# Patient Record
Sex: Female | Born: 1935 | Race: White | Hispanic: No | Marital: Married | Smoking: Never smoker
Health system: Southern US, Community
[De-identification: ages and names within clinical notes are randomized; demographics above are authoritative.]

## PROBLEM LIST (undated history)

## (undated) DIAGNOSIS — F32A Depression, unspecified: Secondary | ICD-10-CM

## (undated) DIAGNOSIS — G4730 Sleep apnea, unspecified: Secondary | ICD-10-CM

## (undated) DIAGNOSIS — R630 Anorexia: Secondary | ICD-10-CM

## (undated) DIAGNOSIS — R51 Headache: Secondary | ICD-10-CM

## (undated) DIAGNOSIS — R011 Cardiac murmur, unspecified: Secondary | ICD-10-CM

## (undated) DIAGNOSIS — D649 Anemia, unspecified: Secondary | ICD-10-CM

## (undated) DIAGNOSIS — T7840XA Allergy, unspecified, initial encounter: Secondary | ICD-10-CM

## (undated) DIAGNOSIS — T8859XA Other complications of anesthesia, initial encounter: Secondary | ICD-10-CM

## (undated) DIAGNOSIS — Z923 Personal history of irradiation: Secondary | ICD-10-CM

## (undated) DIAGNOSIS — T4145XA Adverse effect of unspecified anesthetic, initial encounter: Secondary | ICD-10-CM

## (undated) DIAGNOSIS — M1990 Unspecified osteoarthritis, unspecified site: Secondary | ICD-10-CM

## (undated) DIAGNOSIS — R110 Nausea: Secondary | ICD-10-CM

## (undated) DIAGNOSIS — I209 Angina pectoris, unspecified: Secondary | ICD-10-CM

## (undated) DIAGNOSIS — C801 Malignant (primary) neoplasm, unspecified: Secondary | ICD-10-CM

## (undated) DIAGNOSIS — C189 Malignant neoplasm of colon, unspecified: Secondary | ICD-10-CM

## (undated) DIAGNOSIS — I10 Essential (primary) hypertension: Secondary | ICD-10-CM

## (undated) DIAGNOSIS — R531 Weakness: Secondary | ICD-10-CM

## (undated) DIAGNOSIS — M7989 Other specified soft tissue disorders: Secondary | ICD-10-CM

## (undated) DIAGNOSIS — J3489 Other specified disorders of nose and nasal sinuses: Secondary | ICD-10-CM

## (undated) DIAGNOSIS — F329 Major depressive disorder, single episode, unspecified: Secondary | ICD-10-CM

## (undated) DIAGNOSIS — C787 Secondary malignant neoplasm of liver and intrahepatic bile duct: Secondary | ICD-10-CM

## (undated) DIAGNOSIS — E7800 Pure hypercholesterolemia, unspecified: Secondary | ICD-10-CM

## (undated) DIAGNOSIS — F419 Anxiety disorder, unspecified: Secondary | ICD-10-CM

## (undated) DIAGNOSIS — L719 Rosacea, unspecified: Secondary | ICD-10-CM

## (undated) DIAGNOSIS — R6883 Chills (without fever): Secondary | ICD-10-CM

## (undated) DIAGNOSIS — K219 Gastro-esophageal reflux disease without esophagitis: Secondary | ICD-10-CM

## (undated) HISTORY — DX: Anorexia: R63.0

## (undated) HISTORY — DX: Chills (without fever): R68.83

## (undated) HISTORY — DX: Malignant (primary) neoplasm, unspecified: C80.1

## (undated) HISTORY — DX: Rosacea, unspecified: L71.9

## (undated) HISTORY — PX: INGUINAL HERNIA REPAIR: SUR1180

## (undated) HISTORY — DX: Allergy, unspecified, initial encounter: T78.40XA

## (undated) HISTORY — DX: Weakness: R53.1

## (undated) HISTORY — PX: TONSILLECTOMY: SUR1361

## (undated) HISTORY — DX: Unspecified osteoarthritis, unspecified site: M19.90

## (undated) HISTORY — DX: Cardiac murmur, unspecified: R01.1

## (undated) HISTORY — DX: Anemia, unspecified: D64.9

## (undated) HISTORY — DX: Anxiety disorder, unspecified: F41.9

## (undated) HISTORY — DX: Nausea: R11.0

## (undated) HISTORY — DX: Other specified soft tissue disorders: M79.89

## (undated) HISTORY — DX: Personal history of irradiation: Z92.3

---

## 1995-11-30 HISTORY — PX: CHOLECYSTECTOMY: SHX55

## 1998-04-18 ENCOUNTER — Ambulatory Visit (HOSPITAL_COMMUNITY): Admission: RE | Admit: 1998-04-18 | Discharge: 1998-04-18 | Payer: Self-pay | Admitting: Obstetrics and Gynecology

## 1998-04-18 ENCOUNTER — Ambulatory Visit (HOSPITAL_COMMUNITY): Admission: RE | Admit: 1998-04-18 | Discharge: 1998-04-18 | Payer: Self-pay | Admitting: *Deleted

## 1999-04-10 ENCOUNTER — Ambulatory Visit (HOSPITAL_COMMUNITY): Admission: RE | Admit: 1999-04-10 | Discharge: 1999-04-10 | Payer: Self-pay | Admitting: Obstetrics and Gynecology

## 2000-04-15 ENCOUNTER — Ambulatory Visit (HOSPITAL_COMMUNITY): Admission: RE | Admit: 2000-04-15 | Discharge: 2000-04-15 | Payer: Self-pay | Admitting: Obstetrics & Gynecology

## 2000-04-26 ENCOUNTER — Ambulatory Visit (HOSPITAL_COMMUNITY): Admission: RE | Admit: 2000-04-26 | Discharge: 2000-04-26 | Payer: Self-pay | Admitting: Obstetrics and Gynecology

## 2000-06-06 ENCOUNTER — Encounter: Admission: RE | Admit: 2000-06-06 | Discharge: 2000-09-04 | Payer: Self-pay | Admitting: *Deleted

## 2000-09-07 ENCOUNTER — Encounter: Admission: RE | Admit: 2000-09-07 | Discharge: 2000-12-06 | Payer: Self-pay | Admitting: *Deleted

## 2001-01-21 ENCOUNTER — Ambulatory Visit (HOSPITAL_COMMUNITY): Admission: RE | Admit: 2001-01-21 | Discharge: 2001-01-21 | Payer: Self-pay | Admitting: Family Medicine

## 2001-01-21 ENCOUNTER — Encounter: Payer: Self-pay | Admitting: Family Medicine

## 2001-06-30 ENCOUNTER — Ambulatory Visit (HOSPITAL_COMMUNITY): Admission: RE | Admit: 2001-06-30 | Discharge: 2001-06-30 | Payer: Self-pay

## 2002-05-04 ENCOUNTER — Ambulatory Visit (HOSPITAL_COMMUNITY): Admission: RE | Admit: 2002-05-04 | Discharge: 2002-05-04 | Payer: Self-pay | Admitting: *Deleted

## 2002-07-10 ENCOUNTER — Ambulatory Visit (HOSPITAL_BASED_OUTPATIENT_CLINIC_OR_DEPARTMENT_OTHER): Admission: RE | Admit: 2002-07-10 | Discharge: 2002-07-10 | Payer: Self-pay | Admitting: Family Medicine

## 2002-09-12 ENCOUNTER — Encounter: Payer: Self-pay | Admitting: Family Medicine

## 2002-09-12 ENCOUNTER — Ambulatory Visit (HOSPITAL_COMMUNITY): Admission: RE | Admit: 2002-09-12 | Discharge: 2002-09-12 | Payer: Self-pay | Admitting: Family Medicine

## 2003-02-15 ENCOUNTER — Ambulatory Visit (HOSPITAL_COMMUNITY): Admission: RE | Admit: 2003-02-15 | Discharge: 2003-02-15 | Payer: Self-pay | Admitting: Obstetrics and Gynecology

## 2003-02-15 ENCOUNTER — Encounter: Payer: Self-pay | Admitting: Obstetrics and Gynecology

## 2003-05-24 ENCOUNTER — Ambulatory Visit (HOSPITAL_COMMUNITY): Admission: RE | Admit: 2003-05-24 | Discharge: 2003-05-24 | Payer: Self-pay | Admitting: *Deleted

## 2006-11-29 HISTORY — PX: PORTACATH PLACEMENT: SHX2246

## 2006-11-29 HISTORY — PX: COLON SURGERY: SHX602

## 2007-04-04 ENCOUNTER — Other Ambulatory Visit: Admission: RE | Admit: 2007-04-04 | Discharge: 2007-04-04 | Payer: Self-pay | Admitting: Obstetrics and Gynecology

## 2007-06-15 ENCOUNTER — Encounter: Admission: RE | Admit: 2007-06-15 | Discharge: 2007-06-15 | Payer: Self-pay | Admitting: Gastroenterology

## 2007-08-03 ENCOUNTER — Encounter (INDEPENDENT_AMBULATORY_CARE_PROVIDER_SITE_OTHER): Payer: Self-pay | Admitting: *Deleted

## 2007-08-03 ENCOUNTER — Ambulatory Visit: Payer: Self-pay | Admitting: Oncology

## 2007-08-03 ENCOUNTER — Inpatient Hospital Stay (HOSPITAL_COMMUNITY): Admission: RE | Admit: 2007-08-03 | Discharge: 2007-08-10 | Payer: Self-pay | Admitting: *Deleted

## 2007-08-14 ENCOUNTER — Ambulatory Visit: Payer: Self-pay | Admitting: Oncology

## 2007-08-28 LAB — WOUND CULTURE

## 2007-08-29 LAB — COMPREHENSIVE METABOLIC PANEL
ALT: 9 U/L (ref 0–35)
AST: 15 U/L (ref 0–37)
Albumin: 4.3 g/dL (ref 3.5–5.2)
Alkaline Phosphatase: 92 U/L (ref 39–117)
BUN: 23 mg/dL (ref 6–23)
Potassium: 5.1 mEq/L (ref 3.5–5.3)
Sodium: 138 mEq/L (ref 135–145)
Total Protein: 7.5 g/dL (ref 6.0–8.3)

## 2007-08-29 LAB — CBC WITH DIFFERENTIAL/PLATELET
BASO%: 0.4 % (ref 0.0–2.0)
EOS%: 5 % (ref 0.0–7.0)
MCH: 29.3 pg (ref 26.0–34.0)
MCHC: 34.7 g/dL (ref 32.0–36.0)
MCV: 84.3 fL (ref 81.0–101.0)
MONO%: 4.9 % (ref 0.0–13.0)
RBC: 4.15 10*6/uL (ref 3.70–5.32)
RDW: 14.9 % — ABNORMAL HIGH (ref 11.3–14.5)
lymph#: 3.5 10*3/uL — ABNORMAL HIGH (ref 0.9–3.3)

## 2007-08-31 ENCOUNTER — Ambulatory Visit (HOSPITAL_COMMUNITY): Admission: RE | Admit: 2007-08-31 | Discharge: 2007-08-31 | Payer: Self-pay | Admitting: Oncology

## 2007-09-06 ENCOUNTER — Ambulatory Visit (HOSPITAL_COMMUNITY): Admission: RE | Admit: 2007-09-06 | Discharge: 2007-09-06 | Payer: Self-pay | Admitting: *Deleted

## 2007-09-13 LAB — BASIC METABOLIC PANEL
BUN: 21 mg/dL (ref 6–23)
Creatinine, Ser: 1.25 mg/dL — ABNORMAL HIGH (ref 0.40–1.20)
Glucose, Bld: 139 mg/dL — ABNORMAL HIGH (ref 70–99)
Potassium: 4.9 mEq/L (ref 3.5–5.3)

## 2007-09-29 ENCOUNTER — Ambulatory Visit: Payer: Self-pay | Admitting: Oncology

## 2007-10-03 LAB — CBC WITH DIFFERENTIAL/PLATELET
BASO%: 0.6 % (ref 0.0–2.0)
HCT: 28.9 % — ABNORMAL LOW (ref 34.8–46.6)
HGB: 10.3 g/dL — ABNORMAL LOW (ref 11.6–15.9)
MCHC: 35.6 g/dL (ref 32.0–36.0)
MONO#: 0.3 10*3/uL (ref 0.1–0.9)
NEUT%: 48.7 % (ref 39.6–76.8)
WBC: 4.3 10*3/uL (ref 3.9–10.0)
lymph#: 1.7 10*3/uL (ref 0.9–3.3)

## 2007-10-03 LAB — COMPREHENSIVE METABOLIC PANEL
ALT: 14 U/L (ref 0–35)
Albumin: 3.6 g/dL (ref 3.5–5.2)
CO2: 23 mEq/L (ref 19–32)
Calcium: 9 mg/dL (ref 8.4–10.5)
Chloride: 101 mEq/L (ref 96–112)
Creatinine, Ser: 1.21 mg/dL — ABNORMAL HIGH (ref 0.40–1.20)
Total Protein: 6.8 g/dL (ref 6.0–8.3)

## 2007-10-16 LAB — CBC WITH DIFFERENTIAL/PLATELET
BASO%: 0.4 % (ref 0.0–2.0)
HCT: 27.9 % — ABNORMAL LOW (ref 34.8–46.6)
MCHC: 35.4 g/dL (ref 32.0–36.0)
MONO#: 0.3 10*3/uL (ref 0.1–0.9)
RBC: 3.27 10*6/uL — ABNORMAL LOW (ref 3.70–5.32)
RDW: 16.3 % — ABNORMAL HIGH (ref 11.3–14.5)
WBC: 3.4 10*3/uL — ABNORMAL LOW (ref 3.9–10.0)
lymph#: 1.4 10*3/uL (ref 0.9–3.3)

## 2007-10-16 LAB — COMPREHENSIVE METABOLIC PANEL
ALT: 18 U/L (ref 0–35)
AST: 31 U/L (ref 0–37)
CO2: 23 mEq/L (ref 19–32)
Calcium: 8.8 mg/dL (ref 8.4–10.5)
Chloride: 105 mEq/L (ref 96–112)
Potassium: 4.6 mEq/L (ref 3.5–5.3)
Sodium: 137 mEq/L (ref 135–145)
Total Protein: 6.7 g/dL (ref 6.0–8.3)

## 2007-10-19 LAB — WHOLE BLOOD GLUCOSE: Glucose: 102 mg/dL — ABNORMAL HIGH (ref 70–100)

## 2007-10-30 LAB — CBC WITH DIFFERENTIAL/PLATELET
Basophils Absolute: 0 10*3/uL (ref 0.0–0.1)
EOS%: 2.9 % (ref 0.0–7.0)
Eosinophils Absolute: 0.1 10*3/uL (ref 0.0–0.5)
HGB: 10.3 g/dL — ABNORMAL LOW (ref 11.6–15.9)
LYMPH%: 52.9 % — ABNORMAL HIGH (ref 14.0–48.0)
MCH: 31 pg (ref 26.0–34.0)
MCV: 86.5 fL (ref 81.0–101.0)
MONO%: 9.3 % (ref 0.0–13.0)
NEUT#: 1 10*3/uL — ABNORMAL LOW (ref 1.5–6.5)
NEUT%: 34.3 % — ABNORMAL LOW (ref 39.6–76.8)
Platelets: 149 10*3/uL (ref 145–400)
RDW: 16.9 % — ABNORMAL HIGH (ref 11.3–14.5)

## 2007-10-30 LAB — COMPREHENSIVE METABOLIC PANEL
AST: 33 U/L (ref 0–37)
Albumin: 3.4 g/dL — ABNORMAL LOW (ref 3.5–5.2)
Alkaline Phosphatase: 98 U/L (ref 39–117)
BUN: 13 mg/dL (ref 6–23)
Creatinine, Ser: 1.03 mg/dL (ref 0.40–1.20)
Glucose, Bld: 189 mg/dL — ABNORMAL HIGH (ref 70–99)
Potassium: 4.4 mEq/L (ref 3.5–5.3)
Total Bilirubin: 1.3 mg/dL — ABNORMAL HIGH (ref 0.3–1.2)

## 2007-10-31 LAB — CBC WITH DIFFERENTIAL/PLATELET
Basophils Absolute: 0 10*3/uL (ref 0.0–0.1)
EOS%: 3.3 % (ref 0.0–7.0)
Eosinophils Absolute: 0.1 10*3/uL (ref 0.0–0.5)
HCT: 27.7 % — ABNORMAL LOW (ref 34.8–46.6)
HGB: 9.9 g/dL — ABNORMAL LOW (ref 11.6–15.9)
MCH: 30.9 pg (ref 26.0–34.0)
MCV: 86.8 fL (ref 81.0–101.0)
MONO%: 12.8 % (ref 0.0–13.0)
NEUT#: 0.4 10*3/uL — CL (ref 1.5–6.5)
NEUT%: 17.3 % — ABNORMAL LOW (ref 39.6–76.8)
Platelets: 141 10*3/uL — ABNORMAL LOW (ref 145–400)
RDW: 15.7 % — ABNORMAL HIGH (ref 11.3–14.5)

## 2007-11-07 LAB — COMPREHENSIVE METABOLIC PANEL
Albumin: 3.3 g/dL — ABNORMAL LOW (ref 3.5–5.2)
Alkaline Phosphatase: 127 U/L — ABNORMAL HIGH (ref 39–117)
BUN: 14 mg/dL (ref 6–23)
CO2: 24 mEq/L (ref 19–32)
Glucose, Bld: 189 mg/dL — ABNORMAL HIGH (ref 70–99)
Potassium: 4.3 mEq/L (ref 3.5–5.3)
Total Bilirubin: 1 mg/dL (ref 0.3–1.2)
Total Protein: 6.6 g/dL (ref 6.0–8.3)

## 2007-11-07 LAB — CBC WITH DIFFERENTIAL/PLATELET
BASO%: 0.5 % (ref 0.0–2.0)
Basophils Absolute: 0 10e3/uL (ref 0.0–0.1)
EOS%: 1.8 % (ref 0.0–7.0)
Eosinophils Absolute: 0.1 10e3/uL (ref 0.0–0.5)
HCT: 27.9 % — ABNORMAL LOW (ref 34.8–46.6)
HGB: 9.8 g/dL — ABNORMAL LOW (ref 11.6–15.9)
LYMPH%: 38.9 % (ref 14.0–48.0)
MCH: 30.8 pg (ref 26.0–34.0)
MCHC: 35 g/dL (ref 32.0–36.0)
MCV: 88 fL (ref 81.0–101.0)
MONO#: 0.3 10e3/uL (ref 0.1–0.9)
MONO%: 4.9 % (ref 0.0–13.0)
NEUT#: 3.5 10e3/uL (ref 1.5–6.5)
NEUT%: 53.9 % (ref 39.6–76.8)
Platelets: 247 10e3/uL (ref 145–400)
RBC: 3.17 10e6/uL — ABNORMAL LOW (ref 3.70–5.32)
RDW: 17.7 % — ABNORMAL HIGH (ref 11.3–14.5)
WBC: 6.5 10e3/uL (ref 3.9–10.0)
lymph#: 2.5 10e3/uL (ref 0.9–3.3)

## 2007-11-16 ENCOUNTER — Ambulatory Visit: Payer: Self-pay | Admitting: Oncology

## 2007-11-20 LAB — CBC WITH DIFFERENTIAL/PLATELET
Basophils Absolute: 0 10*3/uL (ref 0.0–0.1)
Eosinophils Absolute: 0.1 10*3/uL (ref 0.0–0.5)
HGB: 10 g/dL — ABNORMAL LOW (ref 11.6–15.9)
MCV: 88.7 fL (ref 81.0–101.0)
MONO#: 0.4 10*3/uL (ref 0.1–0.9)
MONO%: 8.4 % (ref 0.0–13.0)
NEUT#: 2.6 10*3/uL (ref 1.5–6.5)
RBC: 3.14 10*6/uL — ABNORMAL LOW (ref 3.70–5.32)
RDW: 17.5 % — ABNORMAL HIGH (ref 11.3–14.5)
WBC: 5.1 10*3/uL (ref 3.9–10.0)
lymph#: 1.9 10*3/uL (ref 0.9–3.3)

## 2007-11-20 LAB — COMPREHENSIVE METABOLIC PANEL
Albumin: 4.1 g/dL (ref 3.5–5.2)
Alkaline Phosphatase: 98 U/L (ref 39–117)
BUN: 21 mg/dL (ref 6–23)
CO2: 19 mEq/L (ref 19–32)
Calcium: 8.9 mg/dL (ref 8.4–10.5)
Chloride: 103 mEq/L (ref 96–112)
Glucose, Bld: 166 mg/dL — ABNORMAL HIGH (ref 70–99)
Potassium: 4.3 mEq/L (ref 3.5–5.3)
Sodium: 137 mEq/L (ref 135–145)
Total Protein: 7 g/dL (ref 6.0–8.3)

## 2007-11-30 HISTORY — PX: BACK SURGERY: SHX140

## 2007-12-04 LAB — CBC WITH DIFFERENTIAL/PLATELET
BASO%: 0.7 % (ref 0.0–2.0)
EOS%: 2.5 % (ref 0.0–7.0)
HGB: 10 g/dL — ABNORMAL LOW (ref 11.6–15.9)
LYMPH%: 44.1 % (ref 14.0–48.0)
MCV: 90.4 fL (ref 81.0–101.0)
MONO#: 0.4 10*3/uL (ref 0.1–0.9)
NEUT#: 1.4 10*3/uL — ABNORMAL LOW (ref 1.5–6.5)
NEUT%: 41.2 % (ref 39.6–76.8)
RBC: 3.12 10*6/uL — ABNORMAL LOW (ref 3.70–5.32)
WBC: 3.4 10*3/uL — ABNORMAL LOW (ref 3.9–10.0)

## 2007-12-04 LAB — COMPREHENSIVE METABOLIC PANEL
ALT: 23 U/L (ref 0–35)
BUN: 19 mg/dL (ref 6–23)
CO2: 19 mEq/L (ref 19–32)
Creatinine, Ser: 1.2 mg/dL (ref 0.40–1.20)
Total Bilirubin: 0.9 mg/dL (ref 0.3–1.2)

## 2007-12-19 ENCOUNTER — Ambulatory Visit (HOSPITAL_COMMUNITY): Admission: RE | Admit: 2007-12-19 | Discharge: 2007-12-19 | Payer: Self-pay | Admitting: Oncology

## 2007-12-22 LAB — CBC WITH DIFFERENTIAL/PLATELET
BASO%: 0.5 % (ref 0.0–2.0)
Eosinophils Absolute: 0.1 10*3/uL (ref 0.0–0.5)
LYMPH%: 57 % — ABNORMAL HIGH (ref 14.0–48.0)
MCHC: 34.9 g/dL (ref 32.0–36.0)
MONO#: 0.5 10*3/uL (ref 0.1–0.9)
NEUT#: 1.1 10*3/uL — ABNORMAL LOW (ref 1.5–6.5)
Platelets: 292 10*3/uL (ref 145–400)
RBC: 2.94 10*6/uL — ABNORMAL LOW (ref 3.70–5.32)
RDW: 18 % — ABNORMAL HIGH (ref 11.3–14.5)
WBC: 4 10*3/uL (ref 3.9–10.0)
lymph#: 2.3 10*3/uL (ref 0.9–3.3)

## 2007-12-22 LAB — COMPREHENSIVE METABOLIC PANEL
ALT: 14 U/L (ref 0–35)
Albumin: 3.6 g/dL (ref 3.5–5.2)
CO2: 18 mEq/L — ABNORMAL LOW (ref 19–32)
Glucose, Bld: 199 mg/dL — ABNORMAL HIGH (ref 70–99)
Potassium: 4.6 mEq/L (ref 3.5–5.3)
Sodium: 136 mEq/L (ref 135–145)
Total Bilirubin: 0.9 mg/dL (ref 0.3–1.2)
Total Protein: 6.5 g/dL (ref 6.0–8.3)

## 2007-12-22 LAB — CEA: CEA: 3.5 ng/mL (ref 0.0–5.0)

## 2007-12-29 ENCOUNTER — Ambulatory Visit: Payer: Self-pay | Admitting: Oncology

## 2008-01-02 LAB — UA PROTEIN, DIPSTICK - CHCC: Protein, Urine: <30 mg/dL

## 2008-01-02 LAB — COMPREHENSIVE METABOLIC PANEL
ALT: 9 U/L (ref 0–35)
AST: 13 U/L (ref 0–37)
Albumin: 3.5 g/dL (ref 3.5–5.2)
Alkaline Phosphatase: 69 U/L (ref 39–117)
BUN: 25 mg/dL — ABNORMAL HIGH (ref 6–23)
Potassium: 4.6 mEq/L (ref 3.5–5.3)
Sodium: 141 mEq/L (ref 135–145)

## 2008-01-02 LAB — CBC WITH DIFFERENTIAL/PLATELET
BASO%: 0.9 % (ref 0.0–2.0)
EOS%: 1.4 % (ref 0.0–7.0)
MCH: 32.7 pg (ref 26.0–34.0)
MCHC: 34.5 g/dL (ref 32.0–36.0)
NEUT%: 69.5 % (ref 39.6–76.8)
RBC: 3.04 10*6/uL — ABNORMAL LOW (ref 3.70–5.32)
RDW: 13.8 % (ref 11.3–14.5)
WBC: 10 10*3/uL (ref 3.9–10.0)
lymph#: 2.3 10*3/uL (ref 0.9–3.3)

## 2008-01-08 ENCOUNTER — Encounter: Admission: RE | Admit: 2008-01-08 | Discharge: 2008-01-08 | Payer: Self-pay | Admitting: Family Medicine

## 2008-01-15 LAB — CBC WITH DIFFERENTIAL/PLATELET
Basophils Absolute: 0 10*3/uL (ref 0.0–0.1)
EOS%: 3.7 % (ref 0.0–7.0)
HCT: 27.8 % — ABNORMAL LOW (ref 34.8–46.6)
HGB: 9.6 g/dL — ABNORMAL LOW (ref 11.6–15.9)
MCH: 32.6 pg (ref 26.0–34.0)
MCV: 94.6 fL (ref 81.0–101.0)
MONO%: 6.9 % (ref 0.0–13.0)
NEUT%: 50.1 % (ref 39.6–76.8)
RDW: 15.4 % — ABNORMAL HIGH (ref 11.3–14.5)

## 2008-01-15 LAB — COMPREHENSIVE METABOLIC PANEL
AST: 18 U/L (ref 0–37)
Alkaline Phosphatase: 82 U/L (ref 39–117)
BUN: 21 mg/dL (ref 6–23)
Creatinine, Ser: 1.19 mg/dL (ref 0.40–1.20)

## 2008-01-16 LAB — URINALYSIS, MICROSCOPIC - CHCC
Bilirubin (Urine): NEGATIVE
Ketones: NEGATIVE mg/dL
Specific Gravity, Urine: 1.03 (ref 1.003–1.035)

## 2008-01-29 LAB — CBC WITH DIFFERENTIAL/PLATELET
Basophils Absolute: 0 10*3/uL (ref 0.0–0.1)
HCT: 28.8 % — ABNORMAL LOW (ref 34.8–46.6)
HGB: 10.2 g/dL — ABNORMAL LOW (ref 11.6–15.9)
MCH: 33.2 pg (ref 26.0–34.0)
MONO#: 0.4 10*3/uL (ref 0.1–0.9)
NEUT%: 47.8 % (ref 39.6–76.8)
WBC: 5.3 10*3/uL (ref 3.9–10.0)
lymph#: 2.2 10*3/uL (ref 0.9–3.3)

## 2008-01-29 LAB — COMPREHENSIVE METABOLIC PANEL
BUN: 24 mg/dL — ABNORMAL HIGH (ref 6–23)
CO2: 17 mEq/L — ABNORMAL LOW (ref 19–32)
Calcium: 9 mg/dL (ref 8.4–10.5)
Chloride: 107 mEq/L (ref 96–112)
Creatinine, Ser: 1.39 mg/dL — ABNORMAL HIGH (ref 0.40–1.20)

## 2008-02-07 ENCOUNTER — Ambulatory Visit: Payer: Self-pay | Admitting: Oncology

## 2008-02-09 LAB — CBC WITH DIFFERENTIAL/PLATELET
BASO%: 0.5 % (ref 0.0–2.0)
Basophils Absolute: 0 10*3/uL (ref 0.0–0.1)
HCT: 30.5 % — ABNORMAL LOW (ref 34.8–46.6)
HGB: 10.8 g/dL — ABNORMAL LOW (ref 11.6–15.9)
MONO#: 0.4 10*3/uL (ref 0.1–0.9)
NEUT%: 63 % (ref 39.6–76.8)
WBC: 7.8 10*3/uL (ref 3.9–10.0)
lymph#: 2.4 10*3/uL (ref 0.9–3.3)

## 2008-02-09 LAB — BASIC METABOLIC PANEL
BUN: 20 mg/dL (ref 6–23)
CO2: 21 mEq/L (ref 19–32)
Chloride: 103 mEq/L (ref 96–112)
Creatinine, Ser: 1.21 mg/dL — ABNORMAL HIGH (ref 0.40–1.20)

## 2008-02-14 ENCOUNTER — Inpatient Hospital Stay (HOSPITAL_COMMUNITY): Admission: RE | Admit: 2008-02-14 | Discharge: 2008-02-17 | Payer: Self-pay | Admitting: Neurosurgery

## 2008-02-26 LAB — CBC WITH DIFFERENTIAL/PLATELET
Basophils Absolute: 0.1 10*3/uL (ref 0.0–0.1)
EOS%: 1.2 % (ref 0.0–7.0)
HGB: 10.8 g/dL — ABNORMAL LOW (ref 11.6–15.9)
LYMPH%: 24 % (ref 14.0–48.0)
MCH: 32.8 pg (ref 26.0–34.0)
MCV: 94.4 fL (ref 81.0–101.0)
MONO%: 5 % (ref 0.0–13.0)
Platelets: 319 10*3/uL (ref 145–400)
RDW: 15.1 % — ABNORMAL HIGH (ref 11.3–14.5)

## 2008-02-26 LAB — COMPREHENSIVE METABOLIC PANEL
AST: 19 U/L (ref 0–37)
Alkaline Phosphatase: 109 U/L (ref 39–117)
BUN: 20 mg/dL (ref 6–23)
Creatinine, Ser: 1.06 mg/dL (ref 0.40–1.20)
Potassium: 4.4 mEq/L (ref 3.5–5.3)
Total Bilirubin: 0.7 mg/dL (ref 0.3–1.2)

## 2008-03-11 LAB — COMPREHENSIVE METABOLIC PANEL
Albumin: 3.8 g/dL (ref 3.5–5.2)
BUN: 20 mg/dL (ref 6–23)
CO2: 18 mEq/L — ABNORMAL LOW (ref 19–32)
Calcium: 8.7 mg/dL (ref 8.4–10.5)
Glucose, Bld: 201 mg/dL — ABNORMAL HIGH (ref 70–99)
Potassium: 4.1 mEq/L (ref 3.5–5.3)
Sodium: 137 mEq/L (ref 135–145)
Total Protein: 6.8 g/dL (ref 6.0–8.3)

## 2008-03-11 LAB — CBC WITH DIFFERENTIAL/PLATELET
Basophils Absolute: 0 10*3/uL (ref 0.0–0.1)
Eosinophils Absolute: 0.4 10*3/uL (ref 0.0–0.5)
HGB: 9.3 g/dL — ABNORMAL LOW (ref 11.6–15.9)
MCV: 92.9 fL (ref 81.0–101.0)
MONO#: 0.4 10*3/uL (ref 0.1–0.9)
NEUT#: 4.4 10*3/uL (ref 1.5–6.5)
Platelets: 214 10*3/uL (ref 145–400)
RBC: 2.82 10*6/uL — ABNORMAL LOW (ref 3.70–5.32)
RDW: 14.7 % — ABNORMAL HIGH (ref 11.3–14.5)
WBC: 7.5 10*3/uL (ref 3.9–10.0)

## 2008-03-20 ENCOUNTER — Ambulatory Visit: Payer: Self-pay | Admitting: Oncology

## 2008-03-25 LAB — COMPREHENSIVE METABOLIC PANEL
Albumin: 3.9 g/dL (ref 3.5–5.2)
Alkaline Phosphatase: 87 U/L (ref 39–117)
Calcium: 8.8 mg/dL (ref 8.4–10.5)
Chloride: 104 mEq/L (ref 96–112)
Glucose, Bld: 210 mg/dL — ABNORMAL HIGH (ref 70–99)
Potassium: 4.2 mEq/L (ref 3.5–5.3)
Sodium: 140 mEq/L (ref 135–145)
Total Protein: 6.9 g/dL (ref 6.0–8.3)

## 2008-03-25 LAB — CBC WITH DIFFERENTIAL/PLATELET
Basophils Absolute: 0 10*3/uL (ref 0.0–0.1)
Eosinophils Absolute: 0.1 10*3/uL (ref 0.0–0.5)
HGB: 9.3 g/dL — ABNORMAL LOW (ref 11.6–15.9)
MCV: 93.8 fL (ref 81.0–101.0)
MONO#: 0.5 10*3/uL (ref 0.1–0.9)
MONO%: 5.5 % (ref 0.0–13.0)
NEUT#: 6 10*3/uL (ref 1.5–6.5)
RBC: 2.84 10*6/uL — ABNORMAL LOW (ref 3.70–5.32)
RDW: 15.1 % — ABNORMAL HIGH (ref 11.3–14.5)
WBC: 9.2 10*3/uL (ref 3.9–10.0)
lymph#: 2.5 10*3/uL (ref 0.9–3.3)

## 2008-04-30 ENCOUNTER — Ambulatory Visit (HOSPITAL_COMMUNITY): Admission: RE | Admit: 2008-04-30 | Discharge: 2008-04-30 | Payer: Self-pay | Admitting: Oncology

## 2008-05-02 LAB — CBC WITH DIFFERENTIAL/PLATELET
Basophils Absolute: 0.1 10*3/uL (ref 0.0–0.1)
Eosinophils Absolute: 0.3 10*3/uL (ref 0.0–0.5)
HCT: 29.3 % — ABNORMAL LOW (ref 34.8–46.6)
HGB: 9.9 g/dL — ABNORMAL LOW (ref 11.6–15.9)
MCV: 97.3 fL (ref 81.0–101.0)
MONO%: 7.2 % (ref 0.0–13.0)
NEUT#: 4 10*3/uL (ref 1.5–6.5)
NEUT%: 56.6 % (ref 39.6–76.8)
RDW: 15.1 % — ABNORMAL HIGH (ref 11.3–14.5)
lymph#: 2.2 10*3/uL (ref 0.9–3.3)

## 2008-05-02 LAB — COMPREHENSIVE METABOLIC PANEL
ALT: 15 U/L (ref 0–35)
AST: 25 U/L (ref 0–37)
Albumin: 4.1 g/dL (ref 3.5–5.2)
Alkaline Phosphatase: 91 U/L (ref 39–117)
Potassium: 4.1 mEq/L (ref 3.5–5.3)
Sodium: 140 mEq/L (ref 135–145)
Total Protein: 7.2 g/dL (ref 6.0–8.3)

## 2008-05-02 LAB — UA PROTEIN, DIPSTICK - CHCC: Protein, Urine: 30 mg/dL

## 2008-05-03 ENCOUNTER — Ambulatory Visit: Payer: Self-pay | Admitting: Oncology

## 2008-05-14 LAB — CBC WITH DIFFERENTIAL/PLATELET
Basophils Absolute: 0.1 10*3/uL (ref 0.0–0.1)
EOS%: 2.4 % (ref 0.0–7.0)
HGB: 10.5 g/dL — ABNORMAL LOW (ref 11.6–15.9)
MCH: 32.6 pg (ref 26.0–34.0)
MCHC: 33.1 g/dL (ref 32.0–36.0)
MCV: 98.5 fL (ref 81.0–101.0)
MONO%: 7.3 % (ref 0.0–13.0)
RBC: 3.21 10*6/uL — ABNORMAL LOW (ref 3.70–5.32)
RDW: 15.8 % — ABNORMAL HIGH (ref 11.3–14.5)

## 2008-05-27 LAB — COMPREHENSIVE METABOLIC PANEL
Albumin: 4 g/dL (ref 3.5–5.2)
Alkaline Phosphatase: 85 U/L (ref 39–117)
BUN: 23 mg/dL (ref 6–23)
CO2: 20 mEq/L (ref 19–32)
Glucose, Bld: 243 mg/dL — ABNORMAL HIGH (ref 70–99)
Sodium: 137 mEq/L (ref 135–145)
Total Bilirubin: 0.9 mg/dL (ref 0.3–1.2)
Total Protein: 6.7 g/dL (ref 6.0–8.3)

## 2008-05-27 LAB — CBC WITH DIFFERENTIAL/PLATELET
BASO%: 0.3 % (ref 0.0–2.0)
LYMPH%: 28.2 % (ref 14.0–48.0)
MCHC: 35 g/dL (ref 32.0–36.0)
MONO#: 0.6 10*3/uL (ref 0.1–0.9)
MONO%: 7.2 % (ref 0.0–13.0)
Platelets: 182 10*3/uL (ref 145–400)
RBC: 2.91 10*6/uL — ABNORMAL LOW (ref 3.70–5.32)
WBC: 8 10*3/uL (ref 3.9–10.0)

## 2008-05-27 LAB — UA PROTEIN, DIPSTICK - CHCC: Protein, Urine: 30 mg/dL

## 2008-06-11 ENCOUNTER — Ambulatory Visit: Payer: Self-pay | Admitting: Oncology

## 2008-06-11 LAB — CBC WITH DIFFERENTIAL/PLATELET
BASO%: 1.5 % (ref 0.0–2.0)
Basophils Absolute: 0.1 10*3/uL (ref 0.0–0.1)
EOS%: 3.3 % (ref 0.0–7.0)
HCT: 30.5 % — ABNORMAL LOW (ref 34.8–46.6)
HGB: 10.1 g/dL — ABNORMAL LOW (ref 11.6–15.9)
MCH: 32.7 pg (ref 26.0–34.0)
MCHC: 33.2 g/dL (ref 32.0–36.0)
MCV: 98.5 fL (ref 81.0–101.0)
MONO%: 8 % (ref 0.0–13.0)
NEUT%: 53.7 % (ref 39.6–76.8)

## 2008-06-11 LAB — URINALYSIS, MICROSCOPIC - CHCC
Blood: NEGATIVE
Leukocyte Esterase: NEGATIVE
Nitrite: NEGATIVE
Protein: 30 mg/dL
pH: 5 (ref 4.6–8.0)

## 2008-06-24 LAB — COMPREHENSIVE METABOLIC PANEL
ALT: 14 U/L (ref 0–35)
AST: 20 U/L (ref 0–37)
Albumin: 4.3 g/dL (ref 3.5–5.2)
Alkaline Phosphatase: 85 U/L (ref 39–117)
BUN: 25 mg/dL — ABNORMAL HIGH (ref 6–23)
CO2: 19 mEq/L (ref 19–32)
Calcium: 10 mg/dL (ref 8.4–10.5)
Chloride: 103 mEq/L (ref 96–112)
Creatinine, Ser: 1.18 mg/dL (ref 0.40–1.20)
Glucose, Bld: 113 mg/dL — ABNORMAL HIGH (ref 70–99)
Potassium: 4.1 mEq/L (ref 3.5–5.3)
Sodium: 137 mEq/L (ref 135–145)
Total Bilirubin: 1.1 mg/dL (ref 0.3–1.2)
Total Protein: 7.2 g/dL (ref 6.0–8.3)

## 2008-06-24 LAB — CBC WITH DIFFERENTIAL/PLATELET
Basophils Absolute: 0 10*3/uL (ref 0.0–0.1)
EOS%: 3.2 % (ref 0.0–7.0)
HCT: 30.5 % — ABNORMAL LOW (ref 34.8–46.6)
HGB: 10.4 g/dL — ABNORMAL LOW (ref 11.6–15.9)
LYMPH%: 29.2 % (ref 14.0–48.0)
MCH: 33 pg (ref 26.0–34.0)
MCV: 96.3 fL (ref 81.0–101.0)
MONO%: 6 % (ref 0.0–13.0)
NEUT%: 61.2 % (ref 39.6–76.8)
Platelets: 213 10*3/uL (ref 145–400)
lymph#: 2.6 10*3/uL (ref 0.9–3.3)

## 2008-06-27 LAB — CREATININE CLEARANCE, URINE, 24 HOUR
Creatinine, 24H Ur: 1117 mg/d (ref 700–1800)
Creatinine: 1.37 mg/dL — ABNORMAL HIGH (ref 0.40–1.20)
Urine Total Volume-CRCL: 1400 mL

## 2008-06-27 LAB — PROTEIN, URINE, 24 HOUR
Collection Interval-UPROT: 24 hours
Protein, 24H Urine: 350 mg/d — ABNORMAL HIGH (ref 50–100)
Urine Total Volume-UPROT: 1400 mL

## 2008-07-22 LAB — COMPREHENSIVE METABOLIC PANEL
ALT: 18 U/L (ref 0–35)
AST: 22 U/L (ref 0–37)
Albumin: 4 g/dL (ref 3.5–5.2)
Alkaline Phosphatase: 95 U/L (ref 39–117)
BUN: 21 mg/dL (ref 6–23)
Chloride: 101 mEq/L (ref 96–112)
Potassium: 4.4 mEq/L (ref 3.5–5.3)

## 2008-07-22 LAB — CBC WITH DIFFERENTIAL/PLATELET
BASO%: 0.3 % (ref 0.0–2.0)
Basophils Absolute: 0 10*3/uL (ref 0.0–0.1)
EOS%: 2.5 % (ref 0.0–7.0)
HGB: 10 g/dL — ABNORMAL LOW (ref 11.6–15.9)
MCH: 33.8 pg (ref 26.0–34.0)
MCHC: 34.5 g/dL (ref 32.0–36.0)
MCV: 97.9 fL (ref 81.0–101.0)
MONO%: 6 % (ref 0.0–13.0)
RBC: 2.96 10*6/uL — ABNORMAL LOW (ref 3.70–5.32)
RDW: 16.1 % — ABNORMAL HIGH (ref 11.3–14.5)
lymph#: 2.3 10*3/uL (ref 0.9–3.3)

## 2008-07-31 ENCOUNTER — Ambulatory Visit: Payer: Self-pay | Admitting: Oncology

## 2008-08-08 LAB — CBC WITH DIFFERENTIAL/PLATELET
BASO%: 0.9 % (ref 0.0–2.0)
Eosinophils Absolute: 0.2 10*3/uL (ref 0.0–0.5)
LYMPH%: 25.9 % (ref 14.0–48.0)
MCHC: 33 g/dL (ref 32.0–36.0)
MONO#: 0.6 10*3/uL (ref 0.1–0.9)
NEUT#: 6.2 10*3/uL (ref 1.5–6.5)
RBC: 3.13 10*6/uL — ABNORMAL LOW (ref 3.70–5.32)
RDW: 16 % — ABNORMAL HIGH (ref 11.3–14.5)
WBC: 9.5 10*3/uL (ref 3.9–10.0)
lymph#: 2.5 10*3/uL (ref 0.9–3.3)

## 2008-08-16 LAB — CBC WITH DIFFERENTIAL/PLATELET
BASO%: 0.3 % (ref 0.0–2.0)
Eosinophils Absolute: 0.3 10*3/uL (ref 0.0–0.5)
HCT: 29.5 % — ABNORMAL LOW (ref 34.8–46.6)
HGB: 10.2 g/dL — ABNORMAL LOW (ref 11.6–15.9)
MCHC: 34.6 g/dL (ref 32.0–36.0)
MONO#: 0.4 10*3/uL (ref 0.1–0.9)
NEUT#: 5.4 10*3/uL (ref 1.5–6.5)
NEUT%: 62.2 % (ref 39.6–76.8)
Platelets: 253 10*3/uL (ref 145–400)
WBC: 8.7 10*3/uL (ref 3.9–10.0)
lymph#: 2.5 10*3/uL (ref 0.9–3.3)

## 2008-08-16 LAB — COMPREHENSIVE METABOLIC PANEL
Albumin: 4.2 g/dL (ref 3.5–5.2)
CO2: 20 mEq/L (ref 19–32)
Chloride: 103 mEq/L (ref 96–112)
Creatinine, Ser: 1.15 mg/dL (ref 0.40–1.20)
Glucose, Bld: 148 mg/dL — ABNORMAL HIGH (ref 70–99)

## 2008-09-03 ENCOUNTER — Ambulatory Visit (HOSPITAL_COMMUNITY): Admission: RE | Admit: 2008-09-03 | Discharge: 2008-09-03 | Payer: Self-pay | Admitting: Oncology

## 2008-09-10 LAB — CBC WITH DIFFERENTIAL/PLATELET
Basophils Absolute: 0.1 10*3/uL (ref 0.0–0.1)
EOS%: 2.7 % (ref 0.0–7.0)
Eosinophils Absolute: 0.3 10*3/uL (ref 0.0–0.5)
HGB: 10.9 g/dL — ABNORMAL LOW (ref 11.6–15.9)
NEUT#: 5.5 10*3/uL (ref 1.5–6.5)
RDW: 14.2 % (ref 11.3–14.5)
WBC: 9.5 10*3/uL (ref 3.9–10.0)
lymph#: 3 10*3/uL (ref 0.9–3.3)

## 2008-09-20 ENCOUNTER — Ambulatory Visit: Payer: Self-pay | Admitting: Oncology

## 2008-09-24 LAB — CBC WITH DIFFERENTIAL/PLATELET
BASO%: 0.8 % (ref 0.0–2.0)
EOS%: 2.5 % (ref 0.0–7.0)
Eosinophils Absolute: 0.2 10*3/uL (ref 0.0–0.5)
MCH: 32.2 pg (ref 26.0–34.0)
MCHC: 33.1 g/dL (ref 32.0–36.0)
MCV: 97.3 fL (ref 81.0–101.0)
MONO%: 5.5 % (ref 0.0–13.0)
NEUT#: 6.5 10*3/uL (ref 1.5–6.5)
RBC: 3.35 10*6/uL — ABNORMAL LOW (ref 3.70–5.32)
RDW: 14.8 % — ABNORMAL HIGH (ref 11.3–14.5)

## 2008-09-24 LAB — URINALYSIS, MICROSCOPIC - CHCC
Bilirubin (Urine): NEGATIVE
Glucose: NEGATIVE g/dL
Ketones: NEGATIVE mg/dL
Leukocyte Esterase: NEGATIVE
RBC count: NEGATIVE (ref 0–2)
pH: 5 (ref 4.6–8.0)

## 2008-09-24 LAB — COMPREHENSIVE METABOLIC PANEL
Albumin: 3.7 g/dL (ref 3.5–5.2)
Alkaline Phosphatase: 75 U/L (ref 39–117)
CO2: 19 mEq/L (ref 19–32)
Glucose, Bld: 217 mg/dL — ABNORMAL HIGH (ref 70–99)
Potassium: 4.4 mEq/L (ref 3.5–5.3)
Sodium: 137 mEq/L (ref 135–145)
Total Protein: 6.5 g/dL (ref 6.0–8.3)

## 2008-10-07 LAB — CBC WITH DIFFERENTIAL/PLATELET
Eosinophils Absolute: 0.2 10*3/uL (ref 0.0–0.5)
HGB: 10.4 g/dL — ABNORMAL LOW (ref 11.6–15.9)
MCV: 96.8 fL (ref 81.0–101.0)
MONO#: 0.6 10*3/uL (ref 0.1–0.9)
MONO%: 6.2 % (ref 0.0–13.0)
NEUT#: 6.3 10*3/uL (ref 1.5–6.5)
RBC: 3.13 10*6/uL — ABNORMAL LOW (ref 3.70–5.32)
RDW: 16.2 % — ABNORMAL HIGH (ref 11.3–14.5)
WBC: 9.6 10*3/uL (ref 3.9–10.0)
lymph#: 2.5 10*3/uL (ref 0.9–3.3)

## 2008-10-08 LAB — URINALYSIS, MICROSCOPIC - CHCC
Bilirubin (Urine): NEGATIVE
Blood: NEGATIVE
Glucose: NEGATIVE g/dL
Ketones: NEGATIVE mg/dL
Leukocyte Esterase: NEGATIVE

## 2008-10-11 LAB — PROTEIN, URINE, 24 HOUR: Urine Total Volume-UPROT: 800 mL

## 2008-10-21 LAB — CBC WITH DIFFERENTIAL/PLATELET
Basophils Absolute: 0.1 10*3/uL (ref 0.0–0.1)
Eosinophils Absolute: 0.2 10*3/uL (ref 0.0–0.5)
HGB: 10.9 g/dL — ABNORMAL LOW (ref 11.6–15.9)
LYMPH%: 30.5 % (ref 14.0–48.0)
MCV: 95.9 fL (ref 81.0–101.0)
MONO%: 7.1 % (ref 0.0–13.0)
NEUT#: 4.9 10*3/uL (ref 1.5–6.5)
Platelets: 179 10*3/uL (ref 145–400)

## 2008-10-21 LAB — COMPREHENSIVE METABOLIC PANEL
Albumin: 3.8 g/dL (ref 3.5–5.2)
BUN: 23 mg/dL (ref 6–23)
Calcium: 9 mg/dL (ref 8.4–10.5)
Chloride: 104 mEq/L (ref 96–112)
Glucose, Bld: 207 mg/dL — ABNORMAL HIGH (ref 70–99)
Potassium: 4.3 mEq/L (ref 3.5–5.3)

## 2008-11-04 LAB — CBC WITH DIFFERENTIAL/PLATELET
Basophils Absolute: 0 10*3/uL (ref 0.0–0.1)
Eosinophils Absolute: 0.2 10*3/uL (ref 0.0–0.5)
HCT: 31.8 % — ABNORMAL LOW (ref 34.8–46.6)
HGB: 10.8 g/dL — ABNORMAL LOW (ref 11.6–15.9)
MONO#: 0.5 10*3/uL (ref 0.1–0.9)
NEUT#: 5.9 10*3/uL (ref 1.5–6.5)
NEUT%: 63.7 % (ref 39.6–76.8)
RDW: 16.3 % — ABNORMAL HIGH (ref 11.3–14.5)
lymph#: 2.6 10*3/uL (ref 0.9–3.3)

## 2008-11-05 ENCOUNTER — Ambulatory Visit: Payer: Self-pay | Admitting: Oncology

## 2008-11-26 LAB — CBC WITH DIFFERENTIAL/PLATELET
BASO%: 1.1 % (ref 0.0–2.0)
EOS%: 3.2 % (ref 0.0–7.0)
MCH: 31.9 pg (ref 26.0–34.0)
MCHC: 33.2 g/dL (ref 32.0–36.0)
MCV: 96 fL (ref 81.0–101.0)
MONO%: 6.7 % (ref 0.0–13.0)
RBC: 3.28 10*6/uL — ABNORMAL LOW (ref 3.70–5.32)
RDW: 15.8 % — ABNORMAL HIGH (ref 11.3–14.5)
lymph#: 2.5 10*3/uL (ref 0.9–3.3)

## 2008-12-09 LAB — BASIC METABOLIC PANEL
BUN: 25 mg/dL — ABNORMAL HIGH (ref 6–23)
Calcium: 9.3 mg/dL (ref 8.4–10.5)
Glucose, Bld: 111 mg/dL — ABNORMAL HIGH (ref 70–99)
Potassium: 4.4 mEq/L (ref 3.5–5.3)
Sodium: 136 mEq/L (ref 135–145)

## 2008-12-09 LAB — CBC WITH DIFFERENTIAL/PLATELET
EOS%: 2.7 % (ref 0.0–7.0)
Eosinophils Absolute: 0.3 10*3/uL (ref 0.0–0.5)
LYMPH%: 21.9 % (ref 14.0–48.0)
MCH: 32.7 pg (ref 26.0–34.0)
MCV: 95 fL (ref 81.0–101.0)
MONO%: 5.5 % (ref 0.0–13.0)
Platelets: 241 10*3/uL (ref 145–400)
RBC: 3.21 10*6/uL — ABNORMAL LOW (ref 3.70–5.32)
RDW: 16.7 % — ABNORMAL HIGH (ref 11.3–14.5)

## 2008-12-12 ENCOUNTER — Ambulatory Visit: Payer: Self-pay | Admitting: Oncology

## 2008-12-24 LAB — CBC WITH DIFFERENTIAL/PLATELET
BASO%: 0.4 % (ref 0.0–2.0)
Eosinophils Absolute: 0.2 10*3/uL (ref 0.0–0.5)
HCT: 30.1 % — ABNORMAL LOW (ref 34.8–46.6)
MCHC: 34 g/dL (ref 32.0–36.0)
MONO#: 0.5 10*3/uL (ref 0.1–0.9)
NEUT#: 6.1 10*3/uL (ref 1.5–6.5)
Platelets: 192 10*3/uL (ref 145–400)
RBC: 3.15 10*6/uL — ABNORMAL LOW (ref 3.70–5.32)
WBC: 9 10*3/uL (ref 3.9–10.0)
lymph#: 2.2 10*3/uL (ref 0.9–3.3)

## 2008-12-24 LAB — UA PROTEIN, DIPSTICK - CHCC: Protein, Urine: 300 mg/dL

## 2009-01-02 LAB — PROTEIN, URINE, 24 HOUR
Collection Interval-UPROT: 24 hours
Protein, 24H Urine: 1105 mg/d — ABNORMAL HIGH (ref 50–100)

## 2009-01-06 LAB — COMPREHENSIVE METABOLIC PANEL
ALT: 12 U/L (ref 0–35)
AST: 17 U/L (ref 0–37)
Alkaline Phosphatase: 82 U/L (ref 39–117)
CO2: 21 mEq/L (ref 19–32)
Sodium: 137 mEq/L (ref 135–145)
Total Bilirubin: 0.9 mg/dL (ref 0.3–1.2)
Total Protein: 6.8 g/dL (ref 6.0–8.3)

## 2009-01-06 LAB — CBC WITH DIFFERENTIAL/PLATELET
BASO%: 0.3 % (ref 0.0–2.0)
LYMPH%: 25.2 % (ref 14.0–48.0)
MCHC: 33.9 g/dL (ref 32.0–36.0)
MONO#: 0.5 10*3/uL (ref 0.1–0.9)
MONO%: 5.6 % (ref 0.0–13.0)
Platelets: 189 10*3/uL (ref 145–400)
RBC: 3.07 10*6/uL — ABNORMAL LOW (ref 3.70–5.32)
WBC: 8.1 10*3/uL (ref 3.9–10.0)

## 2009-01-20 LAB — COMPREHENSIVE METABOLIC PANEL
ALT: 12 U/L (ref 0–35)
AST: 15 U/L (ref 0–37)
BUN: 24 mg/dL — ABNORMAL HIGH (ref 6–23)
CO2: 21 mEq/L (ref 19–32)
Creatinine, Ser: 1.1 mg/dL (ref 0.40–1.20)
Total Bilirubin: 0.9 mg/dL (ref 0.3–1.2)

## 2009-01-20 LAB — CBC WITH DIFFERENTIAL/PLATELET
BASO%: 0.3 % (ref 0.0–2.0)
EOS%: 2.5 % (ref 0.0–7.0)
HCT: 31 % — ABNORMAL LOW (ref 34.8–46.6)
LYMPH%: 24.5 % (ref 14.0–49.7)
MCH: 32.6 pg (ref 25.1–34.0)
MCHC: 33.8 g/dL (ref 31.5–36.0)
MCV: 96.2 fL (ref 79.5–101.0)
NEUT%: 66.8 % (ref 38.4–76.8)
Platelets: 212 10*3/uL (ref 145–400)

## 2009-01-21 LAB — PROTEIN, URINE, 24 HOUR
Collection Interval-UPROT: 24 hours
Urine Total Volume-UPROT: 1525 mL

## 2009-01-31 ENCOUNTER — Ambulatory Visit: Payer: Self-pay | Admitting: Oncology

## 2009-02-04 LAB — CBC WITH DIFFERENTIAL/PLATELET
BASO%: 0.3 % (ref 0.0–2.0)
Eosinophils Absolute: 0.2 10*3/uL (ref 0.0–0.5)
HCT: 31.9 % — ABNORMAL LOW (ref 34.8–46.6)
LYMPH%: 22 % (ref 14.0–49.7)
MCHC: 32.9 g/dL (ref 31.5–36.0)
MCV: 95.2 fL (ref 79.5–101.0)
MONO#: 0.5 10*3/uL (ref 0.1–0.9)
MONO%: 5.1 % (ref 0.0–14.0)
NEUT%: 70.3 % (ref 38.4–76.8)
Platelets: 190 10*3/uL (ref 145–400)
WBC: 10.3 10*3/uL (ref 3.9–10.3)

## 2009-02-18 LAB — CBC WITH DIFFERENTIAL/PLATELET
Basophils Absolute: 0 10*3/uL (ref 0.0–0.1)
EOS%: 3.3 % (ref 0.0–7.0)
HCT: 31.5 % — ABNORMAL LOW (ref 34.8–46.6)
HGB: 10.2 g/dL — ABNORMAL LOW (ref 11.6–15.9)
MCH: 30.8 pg (ref 25.1–34.0)
MCHC: 32.4 g/dL (ref 31.5–36.0)
MCV: 95.2 fL (ref 79.5–101.0)
MONO%: 8.2 % (ref 0.0–14.0)
NEUT%: 60.5 % (ref 38.4–76.8)

## 2009-02-18 LAB — COMPREHENSIVE METABOLIC PANEL
Alkaline Phosphatase: 78 U/L (ref 39–117)
BUN: 20 mg/dL (ref 6–23)
Creatinine, Ser: 1.17 mg/dL (ref 0.40–1.20)
Glucose, Bld: 101 mg/dL — ABNORMAL HIGH (ref 70–99)
Total Bilirubin: 0.7 mg/dL (ref 0.3–1.2)

## 2009-03-03 ENCOUNTER — Ambulatory Visit (HOSPITAL_COMMUNITY): Admission: RE | Admit: 2009-03-03 | Discharge: 2009-03-03 | Payer: Self-pay | Admitting: Oncology

## 2009-03-11 LAB — CBC WITH DIFFERENTIAL/PLATELET
EOS%: 3.3 % (ref 0.0–7.0)
MCH: 31.3 pg (ref 25.1–34.0)
MCHC: 33 g/dL (ref 31.5–36.0)
MCV: 94.8 fL (ref 79.5–101.0)
MONO%: 5.1 % (ref 0.0–14.0)
RBC: 3.26 10*6/uL — ABNORMAL LOW (ref 3.70–5.45)
RDW: 15.9 % — ABNORMAL HIGH (ref 11.2–14.5)
nRBC: 0 % (ref 0–0)

## 2009-03-20 ENCOUNTER — Ambulatory Visit: Payer: Self-pay | Admitting: Oncology

## 2009-03-24 LAB — CBC WITH DIFFERENTIAL/PLATELET
BASO%: 0.3 % (ref 0.0–2.0)
Basophils Absolute: 0 10e3/uL (ref 0.0–0.1)
EOS%: 2.9 % (ref 0.0–7.0)
Eosinophils Absolute: 0.2 10e3/uL (ref 0.0–0.5)
HCT: 29.7 % — ABNORMAL LOW (ref 34.8–46.6)
HGB: 10.1 g/dL — ABNORMAL LOW (ref 11.6–15.9)
LYMPH%: 27.2 % (ref 14.0–49.7)
MCH: 32.6 pg (ref 25.1–34.0)
MCHC: 34.2 g/dL (ref 31.5–36.0)
MCV: 95.3 fL (ref 79.5–101.0)
MONO#: 0.4 10e3/uL (ref 0.1–0.9)
MONO%: 5.3 % (ref 0.0–14.0)
NEUT#: 5.3 10e3/uL (ref 1.5–6.5)
NEUT%: 64.3 % (ref 38.4–76.8)
Platelets: 235 10e3/uL (ref 145–400)
RBC: 3.12 10e6/uL — ABNORMAL LOW (ref 3.70–5.45)
RDW: 16.2 % — ABNORMAL HIGH (ref 11.2–14.5)
WBC: 8.2 10e3/uL (ref 3.9–10.3)
lymph#: 2.2 10e3/uL (ref 0.9–3.3)

## 2009-03-24 LAB — COMPREHENSIVE METABOLIC PANEL
ALT: 11 U/L (ref 0–35)
AST: 16 U/L (ref 0–37)
Albumin: 4.1 g/dL (ref 3.5–5.2)
CO2: 21 mEq/L (ref 19–32)
Calcium: 9 mg/dL (ref 8.4–10.5)
Chloride: 104 mEq/L (ref 96–112)
Creatinine, Ser: 1.21 mg/dL — ABNORMAL HIGH (ref 0.40–1.20)
Potassium: 5 mEq/L (ref 3.5–5.3)
Sodium: 138 mEq/L (ref 135–145)
Total Protein: 6.9 g/dL (ref 6.0–8.3)

## 2009-03-25 LAB — PROTEIN, URINE, 24 HOUR: Urine Total Volume-UPROT: 1525 mL

## 2009-04-07 ENCOUNTER — Other Ambulatory Visit: Admission: RE | Admit: 2009-04-07 | Discharge: 2009-04-07 | Payer: Self-pay | Admitting: Obstetrics and Gynecology

## 2009-04-11 LAB — CBC WITH DIFFERENTIAL/PLATELET
Eosinophils Absolute: 0.1 10*3/uL (ref 0.0–0.5)
MCV: 94.5 fL (ref 79.5–101.0)
MONO%: 5 % (ref 0.0–14.0)
NEUT#: 6.8 10*3/uL — ABNORMAL HIGH (ref 1.5–6.5)
RBC: 3.03 10*6/uL — ABNORMAL LOW (ref 3.70–5.45)
RDW: 16.2 % — ABNORMAL HIGH (ref 11.2–14.5)
WBC: 8.9 10*3/uL (ref 3.9–10.3)

## 2009-04-11 LAB — COMPREHENSIVE METABOLIC PANEL
AST: 26 U/L (ref 0–37)
Albumin: 3.7 g/dL (ref 3.5–5.2)
Alkaline Phosphatase: 79 U/L (ref 39–117)
Glucose, Bld: 172 mg/dL — ABNORMAL HIGH (ref 70–99)
Potassium: 4.5 mEq/L (ref 3.5–5.3)
Sodium: 138 mEq/L (ref 135–145)
Total Protein: 7 g/dL (ref 6.0–8.3)

## 2009-04-14 ENCOUNTER — Encounter: Admission: RE | Admit: 2009-04-14 | Discharge: 2009-04-14 | Payer: Self-pay | Admitting: Obstetrics and Gynecology

## 2009-05-02 ENCOUNTER — Ambulatory Visit: Payer: Self-pay | Admitting: Oncology

## 2009-05-05 LAB — CBC WITH DIFFERENTIAL/PLATELET
Basophils Absolute: 0 10*3/uL (ref 0.0–0.1)
Eosinophils Absolute: 0.3 10*3/uL (ref 0.0–0.5)
HCT: 30.8 % — ABNORMAL LOW (ref 34.8–46.6)
HGB: 10.1 g/dL — ABNORMAL LOW (ref 11.6–15.9)
MCV: 93.9 fL (ref 79.5–101.0)
MONO%: 5.1 % (ref 0.0–14.0)
NEUT#: 3.8 10*3/uL (ref 1.5–6.5)
NEUT%: 54.4 % (ref 38.4–76.8)
Platelets: 220 10*3/uL (ref 145–400)
RDW: 15.6 % — ABNORMAL HIGH (ref 11.2–14.5)

## 2009-05-05 LAB — COMPREHENSIVE METABOLIC PANEL
Albumin: 3.6 g/dL (ref 3.5–5.2)
Alkaline Phosphatase: 78 U/L (ref 39–117)
BUN: 20 mg/dL (ref 6–23)
Calcium: 9.2 mg/dL (ref 8.4–10.5)
Creatinine, Ser: 1.28 mg/dL — ABNORMAL HIGH (ref 0.40–1.20)
Glucose, Bld: 86 mg/dL (ref 70–99)
Potassium: 4.4 mEq/L (ref 3.5–5.3)

## 2009-05-21 LAB — CREATININE CLEARANCE, URINE, 24 HOUR: Collection Interval-CRCL: 24 hours

## 2009-05-21 LAB — PROTEIN, URINE, 24 HOUR
Collection Interval-UPROT: 24 hours
Protein, 24H Urine: 609 mg/d — ABNORMAL HIGH (ref 50–100)

## 2009-05-26 LAB — CBC WITH DIFFERENTIAL/PLATELET
BASO%: 0.7 % (ref 0.0–2.0)
EOS%: 3.9 % (ref 0.0–7.0)
MCH: 30.1 pg (ref 25.1–34.0)
MCHC: 32.2 g/dL (ref 31.5–36.0)
NEUT%: 65.3 % (ref 38.4–76.8)
RBC: 3.22 10*6/uL — ABNORMAL LOW (ref 3.70–5.45)
RDW: 15.1 % — ABNORMAL HIGH (ref 11.2–14.5)
lymph#: 2.1 10*3/uL (ref 0.9–3.3)
nRBC: 0 % (ref 0–0)

## 2009-06-12 ENCOUNTER — Ambulatory Visit: Payer: Self-pay | Admitting: Oncology

## 2009-06-16 LAB — UA PROTEIN, DIPSTICK - CHCC: Protein, Urine: 300 mg/dL

## 2009-06-16 LAB — CBC WITH DIFFERENTIAL/PLATELET
Basophils Absolute: 0 10*3/uL (ref 0.0–0.1)
Eosinophils Absolute: 0.2 10*3/uL (ref 0.0–0.5)
HGB: 10.2 g/dL — ABNORMAL LOW (ref 11.6–15.9)
MCV: 90.9 fL (ref 79.5–101.0)
MONO%: 5.7 % (ref 0.0–14.0)
NEUT#: 4.5 10*3/uL (ref 1.5–6.5)
RBC: 3.41 10*6/uL — ABNORMAL LOW (ref 3.70–5.45)
RDW: 15.1 % — ABNORMAL HIGH (ref 11.2–14.5)
WBC: 7.4 10*3/uL (ref 3.9–10.3)
lymph#: 2.2 10*3/uL (ref 0.9–3.3)

## 2009-06-17 LAB — BASIC METABOLIC PANEL
CO2: 21 mEq/L (ref 19–32)
Chloride: 108 mEq/L (ref 96–112)
Creatinine, Ser: 1.19 mg/dL (ref 0.40–1.20)
Potassium: 4.2 mEq/L (ref 3.5–5.3)
Sodium: 135 mEq/L (ref 135–145)

## 2009-07-08 ENCOUNTER — Ambulatory Visit: Payer: Self-pay | Admitting: Oncology

## 2009-07-08 LAB — CBC WITH DIFFERENTIAL/PLATELET
Basophils Absolute: 0 10*3/uL (ref 0.0–0.1)
Eosinophils Absolute: 0.2 10*3/uL (ref 0.0–0.5)
HGB: 9.9 g/dL — ABNORMAL LOW (ref 11.6–15.9)
MONO#: 0.3 10*3/uL (ref 0.1–0.9)
NEUT#: 4.1 10*3/uL (ref 1.5–6.5)
RDW: 15.1 % — ABNORMAL HIGH (ref 11.2–14.5)
lymph#: 1.6 10*3/uL (ref 0.9–3.3)

## 2009-07-08 LAB — BASIC METABOLIC PANEL
BUN: 21 mg/dL (ref 6–23)
Chloride: 105 mEq/L (ref 96–112)
Glucose, Bld: 184 mg/dL — ABNORMAL HIGH (ref 70–99)
Potassium: 4.3 mEq/L (ref 3.5–5.3)

## 2009-07-10 LAB — PROTEIN, URINE, 24 HOUR
Collection Interval-UPROT: 24 hours
Protein, 24H Urine: 977 mg/d — ABNORMAL HIGH (ref 50–100)

## 2009-07-29 LAB — COMPREHENSIVE METABOLIC PANEL
Albumin: 3.6 g/dL (ref 3.5–5.2)
Alkaline Phosphatase: 81 U/L (ref 39–117)
BUN: 19 mg/dL (ref 6–23)
CO2: 21 mEq/L (ref 19–32)
Calcium: 9.3 mg/dL (ref 8.4–10.5)
Chloride: 106 mEq/L (ref 96–112)
Glucose, Bld: 131 mg/dL — ABNORMAL HIGH (ref 70–99)
Potassium: 4.3 mEq/L (ref 3.5–5.3)
Sodium: 136 mEq/L (ref 135–145)
Total Protein: 7 g/dL (ref 6.0–8.3)

## 2009-07-29 LAB — CBC WITH DIFFERENTIAL/PLATELET
Basophils Absolute: 0.1 10*3/uL (ref 0.0–0.1)
EOS%: 3.5 % (ref 0.0–7.0)
Eosinophils Absolute: 0.3 10*3/uL (ref 0.0–0.5)
HCT: 31.7 % — ABNORMAL LOW (ref 34.8–46.6)
HGB: 10.3 g/dL — ABNORMAL LOW (ref 11.6–15.9)
MCH: 29.4 pg (ref 25.1–34.0)
NEUT#: 4.4 10*3/uL (ref 1.5–6.5)
NEUT%: 58.7 % (ref 38.4–76.8)
RDW: 15.5 % — ABNORMAL HIGH (ref 11.2–14.5)
lymph#: 2.4 10*3/uL (ref 0.9–3.3)

## 2009-07-31 ENCOUNTER — Ambulatory Visit: Payer: Self-pay | Admitting: Oncology

## 2009-08-19 LAB — CBC WITH DIFFERENTIAL/PLATELET
BASO%: 0.8 % (ref 0.0–2.0)
HCT: 30.2 % — ABNORMAL LOW (ref 34.8–46.6)
MCHC: 31.8 g/dL (ref 31.5–36.0)
MONO#: 0.4 10*3/uL (ref 0.1–0.9)
NEUT#: 4.7 10*3/uL (ref 1.5–6.5)
RBC: 3.33 10*6/uL — ABNORMAL LOW (ref 3.70–5.45)
WBC: 7.7 10*3/uL (ref 3.9–10.3)
lymph#: 2.2 10*3/uL (ref 0.9–3.3)
nRBC: 0 % (ref 0–0)

## 2009-09-03 ENCOUNTER — Ambulatory Visit: Payer: Self-pay | Admitting: Oncology

## 2009-09-11 ENCOUNTER — Ambulatory Visit (HOSPITAL_COMMUNITY): Admission: RE | Admit: 2009-09-11 | Discharge: 2009-09-11 | Payer: Self-pay | Admitting: Oncology

## 2009-09-17 LAB — CBC WITH DIFFERENTIAL/PLATELET
Basophils Absolute: 0 10*3/uL (ref 0.0–0.1)
Eosinophils Absolute: 0.3 10*3/uL (ref 0.0–0.5)
HCT: 31.9 % — ABNORMAL LOW (ref 34.8–46.6)
HGB: 10.3 g/dL — ABNORMAL LOW (ref 11.6–15.9)
LYMPH%: 26.1 % (ref 14.0–49.7)
MONO#: 0.4 10*3/uL (ref 0.1–0.9)
NEUT#: 6 10*3/uL (ref 1.5–6.5)
Platelets: 217 10*3/uL (ref 145–400)
RBC: 3.53 10*6/uL — ABNORMAL LOW (ref 3.70–5.45)
WBC: 9.2 10*3/uL (ref 3.9–10.3)

## 2009-09-17 LAB — COMPREHENSIVE METABOLIC PANEL
ALT: <8 U/L (ref 0–35)
CO2: 20 mEq/L (ref 19–32)
Calcium: 8.8 mg/dL (ref 8.4–10.5)
Chloride: 103 mEq/L (ref 96–112)
Creatinine, Ser: 1.2 mg/dL (ref 0.40–1.20)
Glucose, Bld: 192 mg/dL — ABNORMAL HIGH (ref 70–99)
Total Protein: 6.6 g/dL (ref 6.0–8.3)

## 2009-09-17 LAB — UA PROTEIN, DIPSTICK - CHCC: Protein, Urine: 300 mg/dL

## 2009-10-03 ENCOUNTER — Ambulatory Visit: Payer: Self-pay | Admitting: Oncology

## 2009-10-07 LAB — CBC WITH DIFFERENTIAL/PLATELET
EOS%: 3.1 % (ref 0.0–7.0)
LYMPH%: 28.1 % (ref 14.0–49.7)
MCH: 29 pg (ref 25.1–34.0)
MCHC: 31.8 g/dL (ref 31.5–36.0)
MCV: 91 fL (ref 79.5–101.0)
MONO%: 5.7 % (ref 0.0–14.0)
RBC: 3.35 10*6/uL — ABNORMAL LOW (ref 3.70–5.45)
RDW: 16.4 % — ABNORMAL HIGH (ref 11.2–14.5)
nRBC: 0 % (ref 0–0)

## 2009-10-07 LAB — COMPREHENSIVE METABOLIC PANEL
ALT: 13 U/L (ref 0–35)
AST: 30 U/L (ref 0–37)
Albumin: 3.3 g/dL — ABNORMAL LOW (ref 3.5–5.2)
BUN: 23 mg/dL (ref 6–23)
Calcium: 9 mg/dL (ref 8.4–10.5)
Chloride: 101 mEq/L (ref 96–112)
Potassium: 4.1 mEq/L (ref 3.5–5.3)
Sodium: 134 mEq/L — ABNORMAL LOW (ref 135–145)
Total Protein: 6.3 g/dL (ref 6.0–8.3)

## 2009-10-07 LAB — UA PROTEIN, DIPSTICK - CHCC: Protein, Urine: 100 mg/dL

## 2009-10-07 LAB — TECHNOLOGIST REVIEW

## 2009-10-28 LAB — CBC WITH DIFFERENTIAL/PLATELET
Basophils Absolute: 0.1 10*3/uL (ref 0.0–0.1)
EOS%: 3.9 % (ref 0.0–7.0)
Eosinophils Absolute: 0.3 10*3/uL (ref 0.0–0.5)
HCT: 30.8 % — ABNORMAL LOW (ref 34.8–46.6)
HGB: 9.7 g/dL — ABNORMAL LOW (ref 11.6–15.9)
MCH: 29.3 pg (ref 25.1–34.0)
MCV: 93.1 fL (ref 79.5–101.0)
MONO%: 5.8 % (ref 0.0–14.0)
NEUT%: 56.2 % (ref 38.4–76.8)

## 2009-11-13 ENCOUNTER — Ambulatory Visit: Payer: Self-pay | Admitting: Oncology

## 2009-11-17 LAB — CBC WITH DIFFERENTIAL/PLATELET
BASO%: 0.5 % (ref 0.0–2.0)
EOS%: 5.3 % (ref 0.0–7.0)
HCT: 31.5 % — ABNORMAL LOW (ref 34.8–46.6)
LYMPH%: 30.6 % (ref 14.0–49.7)
MCH: 30.1 pg (ref 25.1–34.0)
MCHC: 32.1 g/dL (ref 31.5–36.0)
MCV: 93.8 fL (ref 79.5–101.0)
MONO%: 5.9 % (ref 0.0–14.0)
NEUT%: 57.7 % (ref 38.4–76.8)
Platelets: 273 10*3/uL (ref 145–400)
lymph#: 2.4 10*3/uL (ref 0.9–3.3)

## 2009-12-09 LAB — CBC WITH DIFFERENTIAL/PLATELET
BASO%: 0.6 % (ref 0.0–2.0)
Eosinophils Absolute: 0.4 10*3/uL (ref 0.0–0.5)
MCHC: 31.7 g/dL (ref 31.5–36.0)
MCV: 93.2 fL (ref 79.5–101.0)
NEUT#: 5.1 10*3/uL (ref 1.5–6.5)
RDW: 15.9 % — ABNORMAL HIGH (ref 11.2–14.5)
lymph#: 2.7 10*3/uL (ref 0.9–3.3)

## 2009-12-09 LAB — COMPREHENSIVE METABOLIC PANEL
ALT: 10 U/L (ref 0–35)
Albumin: 3.9 g/dL (ref 3.5–5.2)
Alkaline Phosphatase: 73 U/L (ref 39–117)
BUN: 25 mg/dL — ABNORMAL HIGH (ref 6–23)
CO2: 20 mEq/L (ref 19–32)
Chloride: 105 mEq/L (ref 96–112)
Creatinine, Ser: 1.39 mg/dL — ABNORMAL HIGH (ref 0.40–1.20)
Glucose, Bld: 82 mg/dL (ref 70–99)
Total Bilirubin: 0.7 mg/dL (ref 0.3–1.2)
Total Protein: 6.4 g/dL (ref 6.0–8.3)

## 2009-12-26 ENCOUNTER — Ambulatory Visit: Payer: Self-pay | Admitting: Oncology

## 2009-12-30 LAB — CBC WITH DIFFERENTIAL/PLATELET
Basophils Absolute: 0.1 10*3/uL (ref 0.0–0.1)
HCT: 31.1 % — ABNORMAL LOW (ref 34.8–46.6)
HGB: 9.8 g/dL — ABNORMAL LOW (ref 11.6–15.9)
LYMPH%: 31.5 % (ref 14.0–49.7)
MCHC: 31.5 g/dL (ref 31.5–36.0)
MCV: 93.7 fL (ref 79.5–101.0)
MONO%: 6.4 % (ref 0.0–14.0)
NEUT#: 5 10*3/uL (ref 1.5–6.5)
RBC: 3.32 10*6/uL — ABNORMAL LOW (ref 3.70–5.45)
nRBC: 0 % (ref 0–0)

## 2010-01-20 LAB — CBC WITH DIFFERENTIAL/PLATELET
Eosinophils Absolute: 0.4 10*3/uL (ref 0.0–0.5)
HCT: 31.2 % — ABNORMAL LOW (ref 34.8–46.6)
MCH: 29.8 pg (ref 25.1–34.0)
MCHC: 32.1 g/dL (ref 31.5–36.0)
MONO%: 6.9 % (ref 0.0–14.0)
NEUT#: 4.6 10*3/uL (ref 1.5–6.5)
WBC: 7.8 10*3/uL (ref 3.9–10.3)

## 2010-01-20 LAB — COMPREHENSIVE METABOLIC PANEL
ALT: 10 U/L (ref 0–35)
AST: 15 U/L (ref 0–37)
Albumin: 3.9 g/dL (ref 3.5–5.2)
Creatinine, Ser: 1.14 mg/dL (ref 0.40–1.20)

## 2010-01-20 LAB — URINALYSIS, MICROSCOPIC - CHCC
Bilirubin (Urine): NEGATIVE
Blood: NEGATIVE
Ketones: NEGATIVE mg/dL
Leukocyte Esterase: NEGATIVE
Nitrite: NEGATIVE
RBC count: NEGATIVE (ref 0–2)
Specific Gravity, Urine: 1.03 (ref 1.003–1.035)
pH: 5 (ref 4.6–8.0)

## 2010-02-09 ENCOUNTER — Ambulatory Visit: Payer: Self-pay | Admitting: Oncology

## 2010-02-11 LAB — CBC WITH DIFFERENTIAL/PLATELET
BASO%: 0.7 % (ref 0.0–2.0)
Basophils Absolute: 0.1 10*3/uL (ref 0.0–0.1)
Eosinophils Absolute: 0.4 10*3/uL (ref 0.0–0.5)
MONO#: 0.5 10*3/uL (ref 0.1–0.9)
Platelets: 268 10*3/uL (ref 145–400)
WBC: 8.9 10*3/uL (ref 3.9–10.3)
lymph#: 2.1 10*3/uL (ref 0.9–3.3)
nRBC: 0 % (ref 0–0)

## 2010-03-12 ENCOUNTER — Ambulatory Visit: Payer: Self-pay | Admitting: Oncology

## 2010-03-12 LAB — COMPREHENSIVE METABOLIC PANEL
Albumin: 3.5 g/dL (ref 3.5–5.2)
Alkaline Phosphatase: 80 U/L (ref 39–117)
Calcium: 9.1 mg/dL (ref 8.4–10.5)
Chloride: 107 mEq/L (ref 96–112)
Creatinine, Ser: 1.27 mg/dL — ABNORMAL HIGH (ref 0.40–1.20)
Glucose, Bld: 156 mg/dL — ABNORMAL HIGH (ref 70–99)

## 2010-03-12 LAB — CBC WITH DIFFERENTIAL/PLATELET
Basophils Absolute: 0.1 10*3/uL (ref 0.0–0.1)
EOS%: 3.6 % (ref 0.0–7.0)
Eosinophils Absolute: 0.4 10*3/uL (ref 0.0–0.5)
HCT: 31.5 % — ABNORMAL LOW (ref 34.8–46.6)
LYMPH%: 24.8 % (ref 14.0–49.7)
MCH: 28.4 pg (ref 25.1–34.0)
MCHC: 31.1 g/dL — ABNORMAL LOW (ref 31.5–36.0)
MONO#: 0.6 10*3/uL (ref 0.1–0.9)
lymph#: 2.9 10*3/uL (ref 0.9–3.3)

## 2010-03-31 ENCOUNTER — Ambulatory Visit (HOSPITAL_COMMUNITY): Admission: RE | Admit: 2010-03-31 | Discharge: 2010-03-31 | Payer: Self-pay | Admitting: Oncology

## 2010-04-07 LAB — CBC WITH DIFFERENTIAL/PLATELET
BASO%: 0.4 % (ref 0.0–2.0)
Basophils Absolute: 0 10*3/uL (ref 0.0–0.1)
EOS%: 3.9 % (ref 0.0–7.0)
LYMPH%: 31.4 % (ref 14.0–49.7)
MCH: 28.8 pg (ref 25.1–34.0)
MCHC: 31.5 g/dL (ref 31.5–36.0)
MCV: 91.4 fL (ref 79.5–101.0)
MONO%: 5.2 % (ref 0.0–14.0)
WBC: 9.4 10*3/uL (ref 3.9–10.3)
nRBC: 0 % (ref 0–0)

## 2010-04-07 LAB — COMPREHENSIVE METABOLIC PANEL
AST: 21 U/L (ref 0–37)
Alkaline Phosphatase: 84 U/L (ref 39–117)
BUN: 29 mg/dL — ABNORMAL HIGH (ref 6–23)
CO2: 23 mEq/L (ref 19–32)
Chloride: 107 mEq/L (ref 96–112)

## 2010-04-21 ENCOUNTER — Ambulatory Visit: Payer: Self-pay | Admitting: Psychiatry

## 2010-04-30 ENCOUNTER — Ambulatory Visit: Payer: Self-pay | Admitting: Psychiatry

## 2010-05-01 ENCOUNTER — Ambulatory Visit: Payer: Self-pay | Admitting: Oncology

## 2010-05-05 LAB — CBC WITH DIFFERENTIAL/PLATELET
BASO%: 0.5 % (ref 0.0–2.0)
Eosinophils Absolute: 0.4 10*3/uL (ref 0.0–0.5)
MCHC: 31.9 g/dL (ref 31.5–36.0)
MONO#: 0.6 10*3/uL (ref 0.1–0.9)
NEUT%: 67 % (ref 38.4–76.8)
Platelets: 264 10*3/uL (ref 145–400)
RDW: 17.4 % — ABNORMAL HIGH (ref 11.2–14.5)
WBC: 11.4 10*3/uL — ABNORMAL HIGH (ref 3.9–10.3)
lymph#: 2.7 10*3/uL (ref 0.9–3.3)

## 2010-05-14 ENCOUNTER — Ambulatory Visit: Payer: Self-pay | Admitting: Psychiatry

## 2010-06-02 ENCOUNTER — Ambulatory Visit: Payer: Self-pay | Admitting: Oncology

## 2010-06-02 LAB — CBC WITH DIFFERENTIAL/PLATELET
BASO%: 0.3 % (ref 0.0–2.0)
Basophils Absolute: 0 10*3/uL (ref 0.0–0.1)
EOS%: 2.3 % (ref 0.0–7.0)
HCT: 34 % — ABNORMAL LOW (ref 34.8–46.6)
LYMPH%: 23 % (ref 14.0–49.7)
MCH: 29.1 pg (ref 25.1–34.0)
MCHC: 31.8 g/dL (ref 31.5–36.0)
MCV: 91.6 fL (ref 79.5–101.0)
MONO%: 3.5 % (ref 0.0–14.0)
NEUT#: 7.7 10*3/uL — ABNORMAL HIGH (ref 1.5–6.5)
RDW: 16.7 % — ABNORMAL HIGH (ref 11.2–14.5)

## 2010-07-24 ENCOUNTER — Ambulatory Visit: Payer: Self-pay | Admitting: Oncology

## 2010-07-28 LAB — CBC WITH DIFFERENTIAL/PLATELET
Eosinophils Absolute: 0.4 10*3/uL (ref 0.0–0.5)
HGB: 10.8 g/dL — ABNORMAL LOW (ref 11.6–15.9)
MCHC: 31.9 g/dL (ref 31.5–36.0)
MCV: 93.6 fL (ref 79.5–101.0)
MONO#: 0.5 10*3/uL (ref 0.1–0.9)
NEUT#: 7.4 10*3/uL — ABNORMAL HIGH (ref 1.5–6.5)
Platelets: 245 10*3/uL (ref 145–400)
RBC: 3.62 10*6/uL — ABNORMAL LOW (ref 3.70–5.45)
RDW: 15.4 % — ABNORMAL HIGH (ref 11.2–14.5)

## 2010-07-28 LAB — COMPREHENSIVE METABOLIC PANEL
ALT: 13 U/L (ref 0–35)
Alkaline Phosphatase: 66 U/L (ref 39–117)
BUN: 22 mg/dL (ref 6–23)
CO2: 22 mEq/L (ref 19–32)
Creatinine, Ser: 1.42 mg/dL — ABNORMAL HIGH (ref 0.40–1.20)
Total Bilirubin: 0.9 mg/dL (ref 0.3–1.2)

## 2010-08-25 ENCOUNTER — Ambulatory Visit: Payer: Self-pay | Admitting: Oncology

## 2010-08-25 LAB — COMPREHENSIVE METABOLIC PANEL
ALT: 9 U/L (ref 0–35)
Albumin: 4 g/dL (ref 3.5–5.2)
Chloride: 102 mEq/L (ref 96–112)
Creatinine, Ser: 1.22 mg/dL — ABNORMAL HIGH (ref 0.40–1.20)
Glucose, Bld: 157 mg/dL — ABNORMAL HIGH (ref 70–99)
Potassium: 4.9 mEq/L (ref 3.5–5.3)
Sodium: 135 mEq/L (ref 135–145)
Total Bilirubin: 0.5 mg/dL (ref 0.3–1.2)

## 2010-08-25 LAB — CBC WITH DIFFERENTIAL/PLATELET
Eosinophils Absolute: 0.4 10*3/uL (ref 0.0–0.5)
LYMPH%: 26.6 % (ref 14.0–49.7)
MONO#: 0.5 10*3/uL (ref 0.1–0.9)
MONO%: 4.8 % (ref 0.0–14.0)
NEUT#: 6.5 10*3/uL (ref 1.5–6.5)
RDW: 15.2 % — ABNORMAL HIGH (ref 11.2–14.5)
lymph#: 2.7 10*3/uL (ref 0.9–3.3)

## 2010-09-22 LAB — CBC WITH DIFFERENTIAL/PLATELET
Basophils Absolute: 0.1 10*3/uL (ref 0.0–0.1)
HCT: 33.3 % — ABNORMAL LOW (ref 34.8–46.6)
LYMPH%: 24.1 % (ref 14.0–49.7)
MCH: 31.1 pg (ref 25.1–34.0)
MCHC: 32.4 g/dL (ref 31.5–36.0)
MONO%: 5.4 % (ref 0.0–14.0)
NEUT#: 7.6 10*3/uL — ABNORMAL HIGH (ref 1.5–6.5)
NEUT%: 67.2 % (ref 38.4–76.8)
RBC: 3.47 10*6/uL — ABNORMAL LOW (ref 3.70–5.45)
RDW: 15 % — ABNORMAL HIGH (ref 11.2–14.5)

## 2010-09-24 ENCOUNTER — Ambulatory Visit: Payer: Self-pay | Admitting: Oncology

## 2010-10-26 ENCOUNTER — Ambulatory Visit: Payer: Self-pay | Admitting: Oncology

## 2010-10-27 LAB — COMPREHENSIVE METABOLIC PANEL
Alkaline Phosphatase: 61 U/L (ref 39–117)
BUN: 21 mg/dL (ref 6–23)
CO2: 21 mEq/L (ref 19–32)
Creatinine, Ser: 1.17 mg/dL (ref 0.40–1.20)
Glucose, Bld: 109 mg/dL — ABNORMAL HIGH (ref 70–99)
Sodium: 137 mEq/L (ref 135–145)
Total Bilirubin: 0.7 mg/dL (ref 0.3–1.2)
Total Protein: 6.4 g/dL (ref 6.0–8.3)

## 2010-10-27 LAB — CBC WITH DIFFERENTIAL/PLATELET
Basophils Absolute: 0.1 10*3/uL (ref 0.0–0.1)
Eosinophils Absolute: 0.4 10*3/uL (ref 0.0–0.5)
HGB: 10.8 g/dL — ABNORMAL LOW (ref 11.6–15.9)
LYMPH%: 25 % (ref 14.0–49.7)
MCV: 94.5 fL (ref 79.5–101.0)
MONO#: 0.5 10*3/uL (ref 0.1–0.9)
MONO%: 4.8 % (ref 0.0–14.0)
NEUT#: 6.3 10*3/uL (ref 1.5–6.5)
Platelets: 252 10*3/uL (ref 145–400)
RBC: 3.44 10*6/uL — ABNORMAL LOW (ref 3.70–5.45)
WBC: 9.5 10*3/uL (ref 3.9–10.3)

## 2010-12-18 ENCOUNTER — Ambulatory Visit: Payer: Self-pay | Admitting: Oncology

## 2010-12-22 LAB — CBC WITH DIFFERENTIAL/PLATELET
HCT: 32.5 % — ABNORMAL LOW (ref 34.8–46.6)
HGB: 10.8 g/dL — ABNORMAL LOW (ref 11.6–15.9)
LYMPH%: 24.1 % (ref 14.0–49.7)
MCHC: 33.2 g/dL (ref 31.5–36.0)
MONO%: 3.9 % (ref 0.0–14.0)
NEUT%: 67.4 % (ref 38.4–76.8)
Platelets: 250 10*3/uL (ref 145–400)
RBC: 3.42 10*6/uL — ABNORMAL LOW (ref 3.70–5.45)
RDW: 14.1 % (ref 11.2–14.5)
WBC: 10.4 10*3/uL — ABNORMAL HIGH (ref 3.9–10.3)
lymph#: 2.5 10*3/uL (ref 0.9–3.3)

## 2011-01-25 ENCOUNTER — Other Ambulatory Visit: Payer: Self-pay | Admitting: Oncology

## 2011-01-25 ENCOUNTER — Encounter (HOSPITAL_BASED_OUTPATIENT_CLINIC_OR_DEPARTMENT_OTHER): Payer: Medicare Other | Admitting: Oncology

## 2011-01-25 DIAGNOSIS — C787 Secondary malignant neoplasm of liver and intrahepatic bile duct: Secondary | ICD-10-CM

## 2011-01-25 DIAGNOSIS — Z5111 Encounter for antineoplastic chemotherapy: Secondary | ICD-10-CM

## 2011-01-25 DIAGNOSIS — T451X5A Adverse effect of antineoplastic and immunosuppressive drugs, initial encounter: Secondary | ICD-10-CM

## 2011-01-25 DIAGNOSIS — K769 Liver disease, unspecified: Secondary | ICD-10-CM

## 2011-01-25 DIAGNOSIS — C189 Malignant neoplasm of colon, unspecified: Secondary | ICD-10-CM

## 2011-01-25 LAB — COMPREHENSIVE METABOLIC PANEL
Alkaline Phosphatase: 61 U/L (ref 39–117)
CO2: 21 mEq/L (ref 19–32)
Creatinine, Ser: 1.28 mg/dL — ABNORMAL HIGH (ref 0.40–1.20)
Glucose, Bld: 176 mg/dL — ABNORMAL HIGH (ref 70–99)
Total Bilirubin: 0.9 mg/dL (ref 0.3–1.2)

## 2011-01-25 LAB — CBC WITH DIFFERENTIAL/PLATELET
Eosinophils Absolute: 0.3 10*3/uL (ref 0.0–0.5)
HCT: 32.6 % — ABNORMAL LOW (ref 34.8–46.6)
LYMPH%: 28.5 % (ref 14.0–49.7)
MCHC: 33.1 g/dL (ref 31.5–36.0)
MCV: 95 fL (ref 79.5–101.0)
MONO#: 0.4 10*3/uL (ref 0.1–0.9)
MONO%: 3.8 % (ref 0.0–14.0)
NEUT#: 6.4 10*3/uL (ref 1.5–6.5)
NEUT%: 63.9 % (ref 38.4–76.8)
Platelets: 269 10*3/uL (ref 145–400)
RBC: 3.43 10*6/uL — ABNORMAL LOW (ref 3.70–5.45)
WBC: 10.1 10*3/uL (ref 3.9–10.3)

## 2011-01-27 ENCOUNTER — Encounter (HOSPITAL_BASED_OUTPATIENT_CLINIC_OR_DEPARTMENT_OTHER): Payer: Medicare Other | Admitting: Oncology

## 2011-01-27 DIAGNOSIS — C189 Malignant neoplasm of colon, unspecified: Secondary | ICD-10-CM

## 2011-01-27 DIAGNOSIS — C787 Secondary malignant neoplasm of liver and intrahepatic bile duct: Secondary | ICD-10-CM

## 2011-02-15 ENCOUNTER — Ambulatory Visit (HOSPITAL_COMMUNITY)
Admission: RE | Admit: 2011-02-15 | Discharge: 2011-02-15 | Disposition: A | Discharge status: Home / Self Care | Payer: Medicare Other | Source: Ambulatory Visit | Attending: Oncology | Admitting: Oncology

## 2011-02-15 DIAGNOSIS — K7689 Other specified diseases of liver: Secondary | ICD-10-CM | POA: Insufficient documentation

## 2011-02-15 DIAGNOSIS — C189 Malignant neoplasm of colon, unspecified: Secondary | ICD-10-CM | POA: Insufficient documentation

## 2011-02-15 DIAGNOSIS — K769 Liver disease, unspecified: Secondary | ICD-10-CM

## 2011-02-15 MED ORDER — IOHEXOL 300 MG/ML  SOLN
100.0000 mL | Freq: Once | INTRAMUSCULAR | Status: AC | PRN
  Administered 2011-02-15: 100 mL via INTRAVENOUS

## 2011-02-22 ENCOUNTER — Other Ambulatory Visit: Payer: Self-pay | Admitting: Oncology

## 2011-02-22 ENCOUNTER — Encounter (HOSPITAL_BASED_OUTPATIENT_CLINIC_OR_DEPARTMENT_OTHER): Payer: Medicare Other | Admitting: Oncology

## 2011-02-22 DIAGNOSIS — K769 Liver disease, unspecified: Secondary | ICD-10-CM

## 2011-02-22 DIAGNOSIS — C787 Secondary malignant neoplasm of liver and intrahepatic bile duct: Secondary | ICD-10-CM

## 2011-02-22 DIAGNOSIS — C189 Malignant neoplasm of colon, unspecified: Secondary | ICD-10-CM

## 2011-02-22 DIAGNOSIS — Z5111 Encounter for antineoplastic chemotherapy: Secondary | ICD-10-CM

## 2011-02-22 LAB — COMPREHENSIVE METABOLIC PANEL
BUN: 25 mg/dL — ABNORMAL HIGH (ref 6–23)
CO2: 18 mEq/L — ABNORMAL LOW (ref 19–32)
Glucose, Bld: 137 mg/dL — ABNORMAL HIGH (ref 70–99)
Sodium: 137 mEq/L (ref 135–145)
Total Bilirubin: 0.6 mg/dL (ref 0.3–1.2)
Total Protein: 7.2 g/dL (ref 6.0–8.3)

## 2011-02-22 LAB — CBC WITH DIFFERENTIAL/PLATELET
Basophils Absolute: 0 10*3/uL (ref 0.0–0.1)
Eosinophils Absolute: 0.2 10*3/uL (ref 0.0–0.5)
HCT: 33.7 % — ABNORMAL LOW (ref 34.8–46.6)
HGB: 11.2 g/dL — ABNORMAL LOW (ref 11.6–15.9)
LYMPH%: 22.3 % (ref 14.0–49.7)
MCV: 95.9 fL (ref 79.5–101.0)
MONO#: 0.2 10*3/uL (ref 0.1–0.9)
NEUT#: 8.9 10*3/uL — ABNORMAL HIGH (ref 1.5–6.5)
NEUT%: 74 % (ref 38.4–76.8)
Platelets: ADEQUATE 10*3/uL (ref 145–400)
WBC: 12.1 10*3/uL — ABNORMAL HIGH (ref 3.9–10.3)

## 2011-03-02 ENCOUNTER — Ambulatory Visit
Admission: RE | Admit: 2011-03-02 | Discharge: 2011-03-02 | Disposition: A | Discharge status: Home / Self Care | Payer: Medicare Other | Source: Ambulatory Visit | Attending: Oncology | Admitting: Oncology

## 2011-03-02 DIAGNOSIS — K769 Liver disease, unspecified: Secondary | ICD-10-CM

## 2011-03-02 MED ORDER — GADOBENATE DIMEGLUMINE 529 MG/ML IV SOLN
10.0000 mL | Freq: Once | INTRAVENOUS | Status: AC | PRN
  Administered 2011-03-02: 10 mL via INTRAVENOUS

## 2011-03-12 ENCOUNTER — Other Ambulatory Visit: Payer: Self-pay | Admitting: Oncology

## 2011-03-12 ENCOUNTER — Encounter (HOSPITAL_BASED_OUTPATIENT_CLINIC_OR_DEPARTMENT_OTHER): Payer: Medicare Other | Admitting: Oncology

## 2011-03-12 DIAGNOSIS — C189 Malignant neoplasm of colon, unspecified: Secondary | ICD-10-CM

## 2011-03-12 DIAGNOSIS — E119 Type 2 diabetes mellitus without complications: Secondary | ICD-10-CM

## 2011-03-12 DIAGNOSIS — Z5111 Encounter for antineoplastic chemotherapy: Secondary | ICD-10-CM

## 2011-03-12 DIAGNOSIS — R630 Anorexia: Secondary | ICD-10-CM

## 2011-03-22 ENCOUNTER — Encounter (HOSPITAL_COMMUNITY): Payer: Self-pay

## 2011-03-22 ENCOUNTER — Encounter (HOSPITAL_COMMUNITY)
Admission: RE | Admit: 2011-03-22 | Discharge: 2011-03-22 | Disposition: A | Discharge status: Home / Self Care | Payer: Medicare Other | Source: Ambulatory Visit | Attending: Oncology | Admitting: Oncology

## 2011-03-22 DIAGNOSIS — C189 Malignant neoplasm of colon, unspecified: Secondary | ICD-10-CM | POA: Insufficient documentation

## 2011-03-22 DIAGNOSIS — C787 Secondary malignant neoplasm of liver and intrahepatic bile duct: Secondary | ICD-10-CM | POA: Insufficient documentation

## 2011-03-22 DIAGNOSIS — Z79899 Other long term (current) drug therapy: Secondary | ICD-10-CM | POA: Insufficient documentation

## 2011-03-22 HISTORY — DX: Malignant neoplasm of colon, unspecified: C18.9

## 2011-03-22 MED ORDER — FLUDEOXYGLUCOSE F - 18 (FDG) INJECTION
18.5000 | Freq: Once | INTRAVENOUS | Status: AC | PRN
  Administered 2011-03-22: 18.5 via INTRAVENOUS

## 2011-03-24 ENCOUNTER — Encounter (HOSPITAL_BASED_OUTPATIENT_CLINIC_OR_DEPARTMENT_OTHER): Payer: Medicare Other | Admitting: Oncology

## 2011-03-24 DIAGNOSIS — C787 Secondary malignant neoplasm of liver and intrahepatic bile duct: Secondary | ICD-10-CM

## 2011-03-24 DIAGNOSIS — C189 Malignant neoplasm of colon, unspecified: Secondary | ICD-10-CM

## 2011-03-31 ENCOUNTER — Other Ambulatory Visit: Payer: Self-pay | Admitting: Oncology

## 2011-03-31 ENCOUNTER — Encounter (HOSPITAL_BASED_OUTPATIENT_CLINIC_OR_DEPARTMENT_OTHER): Payer: Medicare Other | Admitting: Oncology

## 2011-03-31 DIAGNOSIS — C189 Malignant neoplasm of colon, unspecified: Secondary | ICD-10-CM

## 2011-03-31 DIAGNOSIS — Z5111 Encounter for antineoplastic chemotherapy: Secondary | ICD-10-CM

## 2011-03-31 LAB — CBC WITH DIFFERENTIAL/PLATELET
BASO%: 0.4 % (ref 0.0–2.0)
Eosinophils Absolute: 0.4 10*3/uL (ref 0.0–0.5)
HCT: 33.4 % — ABNORMAL LOW (ref 34.8–46.6)
LYMPH%: 26.2 % (ref 14.0–49.7)
MCHC: 32.9 g/dL (ref 31.5–36.0)
MCV: 94.1 fL (ref 79.5–101.0)
MONO#: 0.4 10*3/uL (ref 0.1–0.9)
NEUT%: 65.6 % (ref 38.4–76.8)
Platelets: 264 10*3/uL (ref 145–400)
WBC: 10 10*3/uL (ref 3.9–10.3)

## 2011-03-31 LAB — COMPREHENSIVE METABOLIC PANEL
CO2: 22 mEq/L (ref 19–32)
Creatinine, Ser: 1.15 mg/dL (ref 0.40–1.20)
Glucose, Bld: 129 mg/dL — ABNORMAL HIGH (ref 70–99)
Total Bilirubin: 0.4 mg/dL (ref 0.3–1.2)

## 2011-03-31 LAB — CEA: CEA: 3.8 ng/mL (ref 0.0–5.0)

## 2011-04-01 LAB — PROTEIN, URINE, 24 HOUR
Protein, 24H Urine: 110 mg/d — ABNORMAL HIGH (ref 50–100)
Urine Total Volume-UPROT: 1000 mL

## 2011-04-02 ENCOUNTER — Encounter (HOSPITAL_BASED_OUTPATIENT_CLINIC_OR_DEPARTMENT_OTHER): Payer: Medicare Other | Admitting: Oncology

## 2011-04-02 DIAGNOSIS — Z452 Encounter for adjustment and management of vascular access device: Secondary | ICD-10-CM

## 2011-04-02 DIAGNOSIS — C189 Malignant neoplasm of colon, unspecified: Secondary | ICD-10-CM

## 2011-04-13 ENCOUNTER — Other Ambulatory Visit: Payer: Self-pay | Admitting: Oncology

## 2011-04-13 ENCOUNTER — Encounter (HOSPITAL_BASED_OUTPATIENT_CLINIC_OR_DEPARTMENT_OTHER): Payer: Medicare Other | Admitting: Oncology

## 2011-04-13 DIAGNOSIS — K1231 Oral mucositis (ulcerative) due to antineoplastic therapy: Secondary | ICD-10-CM

## 2011-04-13 DIAGNOSIS — K7689 Other specified diseases of liver: Secondary | ICD-10-CM

## 2011-04-13 DIAGNOSIS — Z5111 Encounter for antineoplastic chemotherapy: Secondary | ICD-10-CM

## 2011-04-13 DIAGNOSIS — C189 Malignant neoplasm of colon, unspecified: Secondary | ICD-10-CM

## 2011-04-13 LAB — CBC WITH DIFFERENTIAL/PLATELET
BASO%: 0.3 % (ref 0.0–2.0)
MCHC: 33.1 g/dL (ref 31.5–36.0)
MONO#: 0.4 10*3/uL (ref 0.1–0.9)
RBC: 3.28 10*6/uL — ABNORMAL LOW (ref 3.70–5.45)
WBC: 7.5 10*3/uL (ref 3.9–10.3)
lymph#: 2.1 10*3/uL (ref 0.9–3.3)

## 2011-04-13 LAB — COMPREHENSIVE METABOLIC PANEL
ALT: 11 U/L (ref 0–35)
CO2: 19 mEq/L (ref 19–32)
Chloride: 102 mEq/L (ref 96–112)
Sodium: 135 mEq/L (ref 135–145)
Total Bilirubin: 0.5 mg/dL (ref 0.3–1.2)
Total Protein: 6.4 g/dL (ref 6.0–8.3)

## 2011-04-13 NOTE — Op Note (Signed)
NAMEROSALYN, ARCHAMBAULT NO.:  [id number]   MEDICAL RECORD NO.:  [id number]          PATIENT TYPE:  INP   LOCATION:  3023                         FACILITY:  [facility]   PHYSICIAN:  Hilda Lias, M.D.   DATE OF BIRTH:  September 12, 1936   DATE OF PROCEDURE:  DATE OF DISCHARGE:  02/17/2008                               OPERATIVE REPORT   ADMISSION DIAGNOSIS:  Lumbar stenosis L4-5 with a herniated disk.   FINAL DIAGNOSIS:  Lumbar stenosis L4-5 with a herniated disk.   CLINICAL HISTORY:  The patient was admitted because of back pain  radiating down to both legs.  X-rays showed stenosis of the L4-5.  Surgery was advised.   LABORATORY:  Normal.   COURSE IN THE HOSPITAL:  The patient was taken to surgery and lumbar  laminectomy 4-5 was done.  Today, she is doing much better, was  ambulating, and she wants to go home.   CONDITION ON DISCHARGE:  Improving.   MEDICATIONS:  1. Percocet.  2. Flexeril.   DIET:  Regular.   ACTIVITY:  Not to drive.   FOLLOW UP:  She will be calling Dr. Franky Macho for a followup appointment.           ______________________________  Hilda Lias, M.D.     EB/MEDQ  D:  02/17/2008  T:  02/17/2008  Job:  [id number]

## 2011-04-13 NOTE — H&P (Signed)
Emily Lawson, SUTTLES                   ACCOUNT NO.:  [id number]   MEDICAL RECORD NO.:  [id number]          PATIENT TYPE:  INP   LOCATION:  1431                         FACILITY:  [facility]   PHYSICIAN:  Alfonse Ras, MD   DATE OF BIRTH:  04-02-1936   DATE OF ADMISSION:  08/03/2007  DATE OF DISCHARGE:  08/10/2007                              HISTORY & PHYSICAL   ADMISSION DIAGNOSIS:  Synchronous colon cancer and questionable liver  metastasis.   HISTORY OF PRESENT ILLNESS:  The patient is a very pleasant 75 year old  white female who has been worked up by colonoscopy and found to have 2  different colon lesions; 1 at the rectum and 1 at the splenic flexure.  I was asked to evaluate by Dr. Sherin Quarry.  She comes in after having home  bowel prep for subtotal colectomy.   PAST MEDICAL HISTORY:  Significant for hypertension, diabetes, anxiety,  and questionable heart murmur.   PAST SURGICAL HISTORY:  Significant for laparoscopic cholecystectomy.   MEDICATIONS:  Metformin, glipizide, Protonix, Benicar, simvastatin, and  Mobic.   SOCIAL HISTORY:  Negative for tobacco or alcohol use.   REVIEW OF SYSTEMS:  Negative for head or neck pain, chest pain, angina,  abdominal pain, or extremity pain.  A 15-point review of systems is  signed and on the chart.   PHYSICAL EXAMINATION:  She is an age-appropriate white female who is  morbidly obese, accompanied by her 2 daughters.  Her weight is 238 pounds.  Her height is 5 feet 1 inch.  HEENT:  Exam is benign.  Normocephalic, atraumatic.  Pupils are equal,  round, and reactive to light.  NECK:  Supple and soft without thyromegaly or cervical adenopathy.  There is no supraclavicular or axillary adenopathy.  HEART:  Regular rate and rhythm without murmurs, rubs, or gallops.  ABDOMEN:  Morbidly obese with no evidence of abdominal wall hernia  defects.  EXTREMITIES:  No clubbing, cyanosis, or edema.   CLINICAL DIAGNOSES:  Adenocarcinoma of the colon  x2, questionable liver  lesions.   PLAN:  For admission, subtotal colectomy, and probable liver biopsy.      Alfonse Ras, MD  Electronically Signed     KRE/MEDQ  D:  08/17/2007  T:  08/17/2007  Job:  [id number]

## 2011-04-13 NOTE — Op Note (Signed)
NAMELAKYNN, Lawson                   ACCOUNT NO.:  [id number]   MEDICAL RECORD NO.:  [id number]          PATIENT TYPE:  AMB   LOCATION:  SDS                          FACILITY:  [facility]   PHYSICIAN:  Alfonse Ras, MD   DATE OF BIRTH:  September 16, 1936   DATE OF PROCEDURE:  09/06/2007  DATE OF DISCHARGE:  09/06/2007                               OPERATIVE REPORT   PREOPERATIVE DIAGNOSIS:  Metastatic colon cancer.   POSTOPERATIVE DIAGNOSIS:  Metastatic colon cancer.   PROCEDURE:  Placement of left subclavian Port-A-Cath.   ANESTHESIA:  MAC.   SURGEON:  Alfonse Ras, M.D.   DESCRIPTION OF PROCEDURE:  The patient was taken to the operating room,  placed in supine position.  After adequate MAC anesthesia was induced,  bilateral chest and neck were prepped and draped in normal sterile  fashion.  Using 1% lidocaine local anesthesia, the skin in the  infraclavicular area was anesthetized and the periosteum of the left  clavicle was anesthetized.  Left subclavian venipuncture was performed  without difficulty and guidewire was placed down to the right atrium.  The placement of guidewire was verified by fluoroscopy.   I then anesthetized an area on the left chest and made a transverse  incision and dissected down to the pectoralis fascia.  The Port-A-Cath  was then placed and sized after it had been flushed and placed in the  left chest wound.  It was tunneled and brought out next to the  guidewire.  Dilator and sheath was then placed over the guidewire and  the dilator was removed.  The catheter was then placed through the  sheath and into the distal superior vena cava.  The sheath was removed.  Good position was verified by fluoroscopy.  It was aspirated and flushed  easily and flushed with concentrated heparin.  The incisions were closed  with subcuticular 4-0 Monocryl.  Steri-Strips and sterile dressings were  applied.  The patient tolerated the procedure well and went to PACU in  good  condition for chest x-ray.      Alfonse Ras, MD  Electronically Signed     KRE/MEDQ  D:  09/06/2007  T:  09/07/2007  Job:  [id number]

## 2011-04-13 NOTE — Op Note (Signed)
NAMEBREELY, PANIK NO.:  [id number]   MEDICAL RECORD NO.:  [id number]          PATIENT TYPE:  INP   LOCATION:  G401                         [facility]AC[facility]   PHYSICIAN:  Alfonse Ras, MD   DATE OF BIRTH:  05-17-1936   DATE OF PROCEDURE:  08/03/2007  DATE OF DISCHARGE:                               OPERATIVE REPORT   PREOPERATIVE DIAGNOSIS:  Synchronous adenocarcinoma of the colon.   POSTOPERATIVE DIAGNOSIS:  Synchronous adenocarcinoma of the colon with  metastatic disease to the liver.   PROCEDURE:  Left hemicolectomy with primary anastomosis and left liver  biopsy.   FINDINGS:  Two synchronous lesions in the left colon and metastases in  the liver.   SURGEON:  Baruch Merl, M.D.   ASSISTANT:  Clovis Pu. Cornett, M.D.   ANESTHESIA:  General.   DESCRIPTION:  The patient was taken to the operating room, placed in  supine position.  After adequate general anesthesia was induced using  endotracheal tube, the abdomen was prepped and draped in normal sterile  fashion.  Using a vertical midline incision, I dissected down to the  fascia and the fascia was opened vertically.  The peritoneum was  entered.  Omental adhesions were taken down from the anterior abdominal  wall and the small ventral hernia.  Two separate lesions were isolated,  one in the distal sigmoid colon and one at the splenic flexure.  The  left colon was mobilized along the line of Toldt using sharp dissection.  The mesentery was taken down of the left colon using the LigaSure.  The  colon was transected in the mid transverse colon and the distal sigmoid  colon.  The colonic specimen was marked with silk sutures and sent off  for pathologic evaluation.  The lesion that was palpated in the right  lobe of the liver was biopsied and showed metastatic adenocarcinoma.   A side-to-side anastomosis was created in the standard fashion using a  GIA 75 stapling device and the defect was closed  with a TA-60.  It was  reinforced with silk sutures.  Tisseel was placed over the anastomosis.  The abdomen was copiously irrigated and the fascia was closed with a  running looped #1 PDS suture.  Skin was closed with staples.  Sponge  count was correct.  There was minimal blood loss.  The patient went to  the recovery room in good condition.   Of note I had a very long discussion with the patient's daughters and  sister, explaining the extent of the disease and probable future  chemotherapy needs.      Alfonse Ras, MD  Electronically Signed     KRE/MEDQ  D:  08/03/2007  T:  08/03/2007  Job:  [id number]   cc:   Tasia Catchings, M.D.  Fax: [phone]

## 2011-04-13 NOTE — Op Note (Signed)
NAMEDENNI, FRANCE                   ACCOUNT NO.:  [id number]   MEDICAL RECORD NO.:  [id number]          PATIENT TYPE:  INP   LOCATION:  3023                         FACILITY:  [facility]   PHYSICIAN:  Coletta Memos, M.D.     DATE OF BIRTH:  [birth year]   DATE OF PROCEDURE:  02/14/2008  DATE OF DISCHARGE:                               OPERATIVE REPORT   PREOPERATIVE DIAGNOSIS:  1. Lumbar stenosis L4-L5.  2. Displaced disc L4-L5.  3. Lumbar radiculopathy.   POSTOPERATIVE DIAGNOSES:  1. Lumbar stenosis L4-L5.  2. Displaced disc L4-L5.  3. Lumbar radiculopathy.   PROCEDURE:  Hemilaminectomy L4, hemilaminectomy L5, with microdissection  for decompression of spinal canal.   COMPLICATIONS:  None.   SURGEON:  Coletta Memos, M.D.   ASSISTANT:  Hewitt Shorts, M.D.   ANESTHESIA:  General endotracheal.   INDICATIONS:  Jake Michaelis presented with severe pain in her back and lower  extremities.  MRI showed severe stenosis at L4-L5.  It appeared she also  had a herniated disc and associated fragment which had migrated caudally  behind the body of L5.  I, therefore, recommended and she agreed to  undergo operative decompression.   DESCRIPTION OF PROCEDURE:  She was taken to the operating room,  intubated, and placed under a general anesthetic.  She was rolled prone  onto a Wilson frame and all pressure points were properly padded.  Her  back was prepped and she was draped in a sterile fashion.  I infiltrated  10 mL 0.5% lidocaine with 1:200,000 epinephrine into the paraspinous  musculature upon entry and also upon closure.  I identified the correct  interlaminar space.  I then used a high speed drill along with a Leksell  rongeur to perform hemilaminectomies of both L4 and L5 with Kerrison  punches.  The ligament was quite thick and redundant, but I was able to  get underneath that and decompress the thecal sac quite well  bilaterally.  I brought the microscope in to aid in microdissection.  With the scope, I inspected the disc space at L4-L5.  While there  certainly was a hump, now with the decompression having been done, I did  not believe that there was an advantage to performing a discectomy.  The  disc space was markedly degenerated and the  less anatomical disruption I believe the better.  With Dr. Earl Gala  assistance, we completed the decompression.  I then irrigated the wound.  I then closed wound in layered fashion using Vicryl sutures to  reapproximate the thoracolumbar subcutaneous and subcuticular layers.  Dermabond was used for a sterile dressing.           ______________________________  Coletta Memos, M.D.     KC/MEDQ  D:  02/14/2008  T:  02/14/2008  Job:  [id number]

## 2011-04-13 NOTE — Discharge Summary (Signed)
[name], Emily Lawson                   ACCOUNT NO.:  [id number]   MEDICAL RECORD NO.:  [id number]          PATIENT TYPE:  INP   LOCATION:  1431                         FACILITY:  [facility]   PHYSICIAN:  Alfonse Ras, MD   DATE OF BIRTH:  December 20, 1935   DATE OF ADMISSION:  08/03/2007  DATE OF DISCHARGE:  08/10/2007                               DISCHARGE SUMMARY   ADMISSION DIAGNOSES:  Synchronous colon cancer and probable liver  metastasis.   DISCHARGE DIAGNOSIS:  Synchronous colon cancer and probable liver  metastasis.   CONDITION ON DISCHARGE:  Good and improved.   DISPOSITION:  Discharged to home.   DISCHARGE MEDICATIONS:  Include,  1. Vicodin 1 to 2 p.o. q.6h. p.r.n. pain.  2. Aspirin 81 mg a day.  3. Metformin 1000 mg a day.  4. Glipizide 10 mg a day.  5. Protonix 40 mg a day.  6. Hydrochlorothiazide 10 mg a day.  7. Benicar 40 mg 1/2 once a day.  8. Lovastatin 40 mg once a day.  9. Iron 65 mg a day.   Followup is with me in 5 days for a staple removal.   HOSPITAL COURSE:  The patient was admitted after a bowel prep and taken  to the operating room where she underwent a subtotal colectomy and liver  biopsy, which showed metastatic cancer.  The patient was slow to resolve  her ileus.  She was maintained on a sliding scale insulin and p.r.n.  Lopressor.  She did have an acute blood loss anemia on postoperative day  #2, for which she received 1 unit of packed red blood cells.  Her  hemoglobin came up nicely to 9.2.  She remained hemodynamically stable.  She was walking, but did not begin passing flatus until  postoperative day #5.  Incision was clean, dry, and intact.  She  maintained her abdominal binder.  By postoperative day #7, she was  tolerating a regular diet, did have some nausea particularly with taking  juices, but was anxious to be discharged home.  So, she is being  discharged to home and follow up with me in 5 days for staple removal.      Alfonse Ras,  MD  Electronically Signed     KRE/MEDQ  D:  08/10/2007  T:  08/11/2007  Job:  [id number]   cc:   Tasia Catchings, M.D.  Fax: [phone]   Elana Alm. Nicholos Johns, M.D.  Fax: [phone]

## 2011-04-20 ENCOUNTER — Other Ambulatory Visit: Payer: Self-pay | Admitting: Oncology

## 2011-04-20 ENCOUNTER — Encounter (HOSPITAL_BASED_OUTPATIENT_CLINIC_OR_DEPARTMENT_OTHER): Payer: Medicare Other | Admitting: Oncology

## 2011-04-20 DIAGNOSIS — Z5111 Encounter for antineoplastic chemotherapy: Secondary | ICD-10-CM

## 2011-04-20 DIAGNOSIS — C184 Malignant neoplasm of transverse colon: Secondary | ICD-10-CM

## 2011-04-20 DIAGNOSIS — C787 Secondary malignant neoplasm of liver and intrahepatic bile duct: Secondary | ICD-10-CM

## 2011-04-20 DIAGNOSIS — C189 Malignant neoplasm of colon, unspecified: Secondary | ICD-10-CM

## 2011-04-20 LAB — COMPREHENSIVE METABOLIC PANEL
ALT: 21 U/L (ref 0–35)
Albumin: 3.6 g/dL (ref 3.5–5.2)
CO2: 18 mEq/L — ABNORMAL LOW (ref 19–32)
Calcium: 9.3 mg/dL (ref 8.4–10.5)
Chloride: 98 mEq/L (ref 96–112)
Glucose, Bld: 336 mg/dL — ABNORMAL HIGH (ref 70–99)
Sodium: 133 mEq/L — ABNORMAL LOW (ref 135–145)
Total Protein: 7 g/dL (ref 6.0–8.3)

## 2011-04-20 LAB — CBC WITH DIFFERENTIAL/PLATELET
BASO%: 0.2 % (ref 0.0–2.0)
EOS%: 0.1 % (ref 0.0–7.0)
LYMPH%: 22.5 % (ref 14.0–49.7)
MCH: 31.2 pg (ref 25.1–34.0)
MCHC: 32.3 g/dL (ref 31.5–36.0)
MONO#: 0.1 10*3/uL (ref 0.1–0.9)
MONO%: 0.7 % (ref 0.0–14.0)
Platelets: 255 10*3/uL (ref 145–400)
RBC: 3.69 10*6/uL — ABNORMAL LOW (ref 3.70–5.45)
WBC: 9.6 10*3/uL (ref 3.9–10.3)

## 2011-04-20 LAB — TECHNOLOGIST REVIEW

## 2011-04-22 ENCOUNTER — Encounter (HOSPITAL_BASED_OUTPATIENT_CLINIC_OR_DEPARTMENT_OTHER): Payer: Medicare Other | Admitting: Oncology

## 2011-04-22 DIAGNOSIS — C787 Secondary malignant neoplasm of liver and intrahepatic bile duct: Secondary | ICD-10-CM

## 2011-04-22 DIAGNOSIS — C189 Malignant neoplasm of colon, unspecified: Secondary | ICD-10-CM

## 2011-05-04 ENCOUNTER — Other Ambulatory Visit: Payer: Self-pay | Admitting: Oncology

## 2011-05-04 ENCOUNTER — Encounter (HOSPITAL_BASED_OUTPATIENT_CLINIC_OR_DEPARTMENT_OTHER): Payer: Medicare Other | Admitting: Oncology

## 2011-05-04 DIAGNOSIS — Z5111 Encounter for antineoplastic chemotherapy: Secondary | ICD-10-CM

## 2011-05-04 DIAGNOSIS — R630 Anorexia: Secondary | ICD-10-CM

## 2011-05-04 DIAGNOSIS — E119 Type 2 diabetes mellitus without complications: Secondary | ICD-10-CM

## 2011-05-04 DIAGNOSIS — C189 Malignant neoplasm of colon, unspecified: Secondary | ICD-10-CM

## 2011-05-04 DIAGNOSIS — C787 Secondary malignant neoplasm of liver and intrahepatic bile duct: Secondary | ICD-10-CM

## 2011-05-04 LAB — COMPREHENSIVE METABOLIC PANEL
ALT: 16 U/L (ref 0–35)
AST: 19 U/L (ref 0–37)
Calcium: 8.9 mg/dL (ref 8.4–10.5)
Chloride: 101 mEq/L (ref 96–112)
Creatinine, Ser: 1.18 mg/dL — ABNORMAL HIGH (ref 0.50–1.10)
Potassium: 4.7 mEq/L (ref 3.5–5.3)
Sodium: 134 mEq/L — ABNORMAL LOW (ref 135–145)
Total Protein: 6.8 g/dL (ref 6.0–8.3)

## 2011-05-04 LAB — CBC WITH DIFFERENTIAL/PLATELET
BASO%: 0.2 % (ref 0.0–2.0)
LYMPH%: 18.1 % (ref 14.0–49.7)
MCHC: 33.3 g/dL (ref 31.5–36.0)
MONO#: 0 10*3/uL — ABNORMAL LOW (ref 0.1–0.9)
RBC: 3.41 10*6/uL — ABNORMAL LOW (ref 3.70–5.45)
RDW: 14.6 % — ABNORMAL HIGH (ref 11.2–14.5)
WBC: 6.6 10*3/uL (ref 3.9–10.3)
lymph#: 1.2 10*3/uL (ref 0.9–3.3)
nRBC: 0 % (ref 0–0)

## 2011-05-06 ENCOUNTER — Encounter (HOSPITAL_BASED_OUTPATIENT_CLINIC_OR_DEPARTMENT_OTHER): Payer: Medicare Other | Admitting: Oncology

## 2011-05-06 DIAGNOSIS — C189 Malignant neoplasm of colon, unspecified: Secondary | ICD-10-CM

## 2011-05-06 DIAGNOSIS — C787 Secondary malignant neoplasm of liver and intrahepatic bile duct: Secondary | ICD-10-CM

## 2011-05-18 ENCOUNTER — Other Ambulatory Visit: Payer: Self-pay | Admitting: Oncology

## 2011-05-18 ENCOUNTER — Encounter (HOSPITAL_BASED_OUTPATIENT_CLINIC_OR_DEPARTMENT_OTHER): Payer: Medicare Other | Admitting: Oncology

## 2011-05-18 DIAGNOSIS — Z5111 Encounter for antineoplastic chemotherapy: Secondary | ICD-10-CM

## 2011-05-18 DIAGNOSIS — C186 Malignant neoplasm of descending colon: Secondary | ICD-10-CM

## 2011-05-18 DIAGNOSIS — C787 Secondary malignant neoplasm of liver and intrahepatic bile duct: Secondary | ICD-10-CM

## 2011-05-18 DIAGNOSIS — K1231 Oral mucositis (ulcerative) due to antineoplastic therapy: Secondary | ICD-10-CM

## 2011-05-18 DIAGNOSIS — K7689 Other specified diseases of liver: Secondary | ICD-10-CM

## 2011-05-18 DIAGNOSIS — C189 Malignant neoplasm of colon, unspecified: Secondary | ICD-10-CM

## 2011-05-18 LAB — CBC WITH DIFFERENTIAL/PLATELET
BASO%: 0.1 % (ref 0.0–2.0)
EOS%: 0 % (ref 0.0–7.0)
HCT: 33.4 % — ABNORMAL LOW (ref 34.8–46.6)
LYMPH%: 15.7 % (ref 14.0–49.7)
MCH: 31.8 pg (ref 25.1–34.0)
MCHC: 34.1 g/dL (ref 31.5–36.0)
NEUT%: 84 % — ABNORMAL HIGH (ref 38.4–76.8)
lymph#: 1.4 10*3/uL (ref 0.9–3.3)

## 2011-05-18 LAB — COMPREHENSIVE METABOLIC PANEL
ALT: 18 U/L (ref 0–35)
AST: 22 U/L (ref 0–37)
Creatinine, Ser: 1.43 mg/dL — ABNORMAL HIGH (ref 0.50–1.10)
Total Bilirubin: 0.6 mg/dL (ref 0.3–1.2)

## 2011-05-20 ENCOUNTER — Encounter (HOSPITAL_BASED_OUTPATIENT_CLINIC_OR_DEPARTMENT_OTHER): Payer: Medicare Other | Admitting: Oncology

## 2011-05-20 DIAGNOSIS — C189 Malignant neoplasm of colon, unspecified: Secondary | ICD-10-CM

## 2011-05-20 DIAGNOSIS — C787 Secondary malignant neoplasm of liver and intrahepatic bile duct: Secondary | ICD-10-CM

## 2011-05-20 DIAGNOSIS — C186 Malignant neoplasm of descending colon: Secondary | ICD-10-CM

## 2011-05-27 ENCOUNTER — Other Ambulatory Visit (HOSPITAL_COMMUNITY): Payer: Medicare Other | Admitting: Dentistry

## 2011-05-27 DIAGNOSIS — Z79899 Other long term (current) drug therapy: Secondary | ICD-10-CM

## 2011-05-27 DIAGNOSIS — C189 Malignant neoplasm of colon, unspecified: Secondary | ICD-10-CM

## 2011-05-27 DIAGNOSIS — C801 Malignant (primary) neoplasm, unspecified: Secondary | ICD-10-CM

## 2011-06-01 ENCOUNTER — Other Ambulatory Visit: Payer: Self-pay | Admitting: Oncology

## 2011-06-01 ENCOUNTER — Encounter (HOSPITAL_BASED_OUTPATIENT_CLINIC_OR_DEPARTMENT_OTHER): Payer: Medicare Other | Admitting: Oncology

## 2011-06-01 DIAGNOSIS — Z5111 Encounter for antineoplastic chemotherapy: Secondary | ICD-10-CM

## 2011-06-01 DIAGNOSIS — K7689 Other specified diseases of liver: Secondary | ICD-10-CM

## 2011-06-01 DIAGNOSIS — C186 Malignant neoplasm of descending colon: Secondary | ICD-10-CM

## 2011-06-01 DIAGNOSIS — C787 Secondary malignant neoplasm of liver and intrahepatic bile duct: Secondary | ICD-10-CM

## 2011-06-01 DIAGNOSIS — C189 Malignant neoplasm of colon, unspecified: Secondary | ICD-10-CM

## 2011-06-01 DIAGNOSIS — K1231 Oral mucositis (ulcerative) due to antineoplastic therapy: Secondary | ICD-10-CM

## 2011-06-01 LAB — COMPREHENSIVE METABOLIC PANEL
ALT: 15 U/L (ref 0–35)
AST: 21 U/L (ref 0–37)
CO2: 17 mEq/L — ABNORMAL LOW (ref 19–32)
Creatinine, Ser: 1.25 mg/dL — ABNORMAL HIGH (ref 0.50–1.10)
Total Bilirubin: 0.7 mg/dL (ref 0.3–1.2)

## 2011-06-01 LAB — CBC WITH DIFFERENTIAL/PLATELET
BASO%: 0.5 % (ref 0.0–2.0)
EOS%: 0 % (ref 0.0–7.0)
HCT: 31.8 % — ABNORMAL LOW (ref 34.8–46.6)
LYMPH%: 26.1 % (ref 14.0–49.7)
MCH: 32 pg (ref 25.1–34.0)
MCHC: 33.6 g/dL (ref 31.5–36.0)
NEUT%: 72.7 % (ref 38.4–76.8)
Platelets: 203 10*3/uL (ref 145–400)

## 2011-06-03 ENCOUNTER — Encounter (HOSPITAL_BASED_OUTPATIENT_CLINIC_OR_DEPARTMENT_OTHER): Payer: Medicare Other | Admitting: Oncology

## 2011-06-03 DIAGNOSIS — C787 Secondary malignant neoplasm of liver and intrahepatic bile duct: Secondary | ICD-10-CM

## 2011-06-03 DIAGNOSIS — C189 Malignant neoplasm of colon, unspecified: Secondary | ICD-10-CM

## 2011-06-15 ENCOUNTER — Other Ambulatory Visit: Payer: Self-pay | Admitting: Oncology

## 2011-06-15 ENCOUNTER — Encounter (HOSPITAL_BASED_OUTPATIENT_CLINIC_OR_DEPARTMENT_OTHER): Payer: Medicare Other | Admitting: Oncology

## 2011-06-15 DIAGNOSIS — C189 Malignant neoplasm of colon, unspecified: Secondary | ICD-10-CM

## 2011-06-15 DIAGNOSIS — C787 Secondary malignant neoplasm of liver and intrahepatic bile duct: Secondary | ICD-10-CM

## 2011-06-15 LAB — COMPREHENSIVE METABOLIC PANEL
BUN: 27 mg/dL — ABNORMAL HIGH (ref 6–23)
CO2: 18 mEq/L — ABNORMAL LOW (ref 19–32)
Calcium: 10 mg/dL (ref 8.4–10.5)
Creatinine, Ser: 1.47 mg/dL — ABNORMAL HIGH (ref 0.50–1.10)
Glucose, Bld: 331 mg/dL — ABNORMAL HIGH (ref 70–99)
Total Bilirubin: 0.9 mg/dL (ref 0.3–1.2)

## 2011-06-15 LAB — CBC WITH DIFFERENTIAL/PLATELET
BASO%: 0.3 % (ref 0.0–2.0)
Basophils Absolute: 0 10*3/uL (ref 0.0–0.1)
HCT: 31.3 % — ABNORMAL LOW (ref 34.8–46.6)
HGB: 10.9 g/dL — ABNORMAL LOW (ref 11.6–15.9)
LYMPH%: 14.3 % (ref 14.0–49.7)
MCHC: 34.8 g/dL (ref 31.5–36.0)
MONO#: 0 10*3/uL — ABNORMAL LOW (ref 0.1–0.9)
NEUT%: 85 % — ABNORMAL HIGH (ref 38.4–76.8)
Platelets: 208 10*3/uL (ref 145–400)
WBC: 8.4 10*3/uL (ref 3.9–10.3)

## 2011-06-15 LAB — MAGNESIUM: Magnesium: 1.3 mg/dL — ABNORMAL LOW (ref 1.5–2.5)

## 2011-06-16 ENCOUNTER — Encounter (HOSPITAL_BASED_OUTPATIENT_CLINIC_OR_DEPARTMENT_OTHER): Payer: Medicare Other | Admitting: Oncology

## 2011-06-16 DIAGNOSIS — C189 Malignant neoplasm of colon, unspecified: Secondary | ICD-10-CM

## 2011-06-16 DIAGNOSIS — C787 Secondary malignant neoplasm of liver and intrahepatic bile duct: Secondary | ICD-10-CM

## 2011-06-16 DIAGNOSIS — Z5111 Encounter for antineoplastic chemotherapy: Secondary | ICD-10-CM

## 2011-06-18 ENCOUNTER — Encounter (HOSPITAL_BASED_OUTPATIENT_CLINIC_OR_DEPARTMENT_OTHER): Payer: Medicare Other | Admitting: Oncology

## 2011-06-18 DIAGNOSIS — C189 Malignant neoplasm of colon, unspecified: Secondary | ICD-10-CM

## 2011-06-18 DIAGNOSIS — C787 Secondary malignant neoplasm of liver and intrahepatic bile duct: Secondary | ICD-10-CM

## 2011-06-18 DIAGNOSIS — C186 Malignant neoplasm of descending colon: Secondary | ICD-10-CM

## 2011-06-18 DIAGNOSIS — Z5111 Encounter for antineoplastic chemotherapy: Secondary | ICD-10-CM

## 2011-06-28 ENCOUNTER — Ambulatory Visit (HOSPITAL_COMMUNITY)
Admission: RE | Admit: 2011-06-28 | Discharge: 2011-06-28 | Disposition: A | Discharge status: Home / Self Care | Payer: Medicare Other | Source: Ambulatory Visit | Attending: Oncology | Admitting: Oncology

## 2011-06-28 ENCOUNTER — Other Ambulatory Visit (HOSPITAL_COMMUNITY): Payer: Self-pay

## 2011-06-28 ENCOUNTER — Encounter (HOSPITAL_COMMUNITY): Payer: Self-pay

## 2011-06-28 DIAGNOSIS — C787 Secondary malignant neoplasm of liver and intrahepatic bile duct: Secondary | ICD-10-CM | POA: Insufficient documentation

## 2011-06-28 DIAGNOSIS — N281 Cyst of kidney, acquired: Secondary | ICD-10-CM | POA: Insufficient documentation

## 2011-06-28 DIAGNOSIS — Z9089 Acquired absence of other organs: Secondary | ICD-10-CM | POA: Insufficient documentation

## 2011-06-28 DIAGNOSIS — K439 Ventral hernia without obstruction or gangrene: Secondary | ICD-10-CM | POA: Insufficient documentation

## 2011-06-28 DIAGNOSIS — C189 Malignant neoplasm of colon, unspecified: Secondary | ICD-10-CM | POA: Insufficient documentation

## 2011-06-28 HISTORY — DX: Essential (primary) hypertension: I10

## 2011-06-28 HISTORY — DX: Secondary malignant neoplasm of liver and intrahepatic bile duct: C78.7

## 2011-06-28 MED ORDER — IOHEXOL 300 MG/ML  SOLN
100.0000 mL | Freq: Once | INTRAMUSCULAR | Status: AC | PRN
  Administered 2011-06-28: 100 mL via INTRAVENOUS

## 2011-07-05 ENCOUNTER — Encounter (HOSPITAL_BASED_OUTPATIENT_CLINIC_OR_DEPARTMENT_OTHER): Payer: Medicare Other | Admitting: Oncology

## 2011-07-05 DIAGNOSIS — C189 Malignant neoplasm of colon, unspecified: Secondary | ICD-10-CM

## 2011-07-05 DIAGNOSIS — C787 Secondary malignant neoplasm of liver and intrahepatic bile duct: Secondary | ICD-10-CM

## 2011-07-13 ENCOUNTER — Other Ambulatory Visit: Payer: Self-pay | Admitting: Oncology

## 2011-07-13 ENCOUNTER — Encounter (HOSPITAL_BASED_OUTPATIENT_CLINIC_OR_DEPARTMENT_OTHER): Payer: Medicare Other | Admitting: Oncology

## 2011-07-13 DIAGNOSIS — C189 Malignant neoplasm of colon, unspecified: Secondary | ICD-10-CM

## 2011-07-13 DIAGNOSIS — Z5111 Encounter for antineoplastic chemotherapy: Secondary | ICD-10-CM

## 2011-07-13 DIAGNOSIS — C186 Malignant neoplasm of descending colon: Secondary | ICD-10-CM

## 2011-07-13 DIAGNOSIS — C787 Secondary malignant neoplasm of liver and intrahepatic bile duct: Secondary | ICD-10-CM

## 2011-07-13 LAB — COMPREHENSIVE METABOLIC PANEL
ALT: 9 U/L (ref 0–35)
AST: 16 U/L (ref 0–37)
Calcium: 10 mg/dL (ref 8.4–10.5)
Chloride: 98 mEq/L (ref 96–112)
Creatinine, Ser: 1.13 mg/dL — ABNORMAL HIGH (ref 0.50–1.10)

## 2011-07-13 LAB — CBC WITH DIFFERENTIAL/PLATELET
BASO%: 0.4 % (ref 0.0–2.0)
EOS%: 3 % (ref 0.0–7.0)
HCT: 30.7 % — ABNORMAL LOW (ref 34.8–46.6)
MCH: 32.6 pg (ref 25.1–34.0)
MCHC: 33.2 g/dL (ref 31.5–36.0)
NEUT%: 70.6 % (ref 38.4–76.8)
RDW: 14.3 % (ref 11.2–14.5)
lymph#: 2.5 10*3/uL (ref 0.9–3.3)

## 2011-07-15 ENCOUNTER — Encounter (HOSPITAL_BASED_OUTPATIENT_CLINIC_OR_DEPARTMENT_OTHER): Payer: Medicare Other | Admitting: Oncology

## 2011-07-15 DIAGNOSIS — C189 Malignant neoplasm of colon, unspecified: Secondary | ICD-10-CM

## 2011-07-15 DIAGNOSIS — C787 Secondary malignant neoplasm of liver and intrahepatic bile duct: Secondary | ICD-10-CM

## 2011-07-27 ENCOUNTER — Encounter (HOSPITAL_BASED_OUTPATIENT_CLINIC_OR_DEPARTMENT_OTHER): Payer: Medicare Other | Admitting: Oncology

## 2011-07-27 ENCOUNTER — Other Ambulatory Visit: Payer: Self-pay | Admitting: Oncology

## 2011-07-27 DIAGNOSIS — C186 Malignant neoplasm of descending colon: Secondary | ICD-10-CM

## 2011-07-27 DIAGNOSIS — Z5111 Encounter for antineoplastic chemotherapy: Secondary | ICD-10-CM

## 2011-07-27 DIAGNOSIS — C189 Malignant neoplasm of colon, unspecified: Secondary | ICD-10-CM

## 2011-07-27 DIAGNOSIS — C787 Secondary malignant neoplasm of liver and intrahepatic bile duct: Secondary | ICD-10-CM

## 2011-07-27 LAB — COMPREHENSIVE METABOLIC PANEL
AST: 17 U/L (ref 0–37)
Alkaline Phosphatase: 86 U/L (ref 39–117)
BUN: 22 mg/dL (ref 6–23)
Creatinine, Ser: 1.22 mg/dL — ABNORMAL HIGH (ref 0.50–1.10)
Total Bilirubin: 0.6 mg/dL (ref 0.3–1.2)

## 2011-07-27 LAB — CBC WITH DIFFERENTIAL/PLATELET
BASO%: 0.2 % (ref 0.0–2.0)
EOS%: 0 % (ref 0.0–7.0)
HCT: 31.2 % — ABNORMAL LOW (ref 34.8–46.6)
LYMPH%: 16.4 % (ref 14.0–49.7)
MCH: 31.7 pg (ref 25.1–34.0)
MCHC: 33 g/dL (ref 31.5–36.0)
NEUT%: 82.4 % — ABNORMAL HIGH (ref 38.4–76.8)
Platelets: 269 10*3/uL (ref 145–400)
RBC: 3.25 10*6/uL — ABNORMAL LOW (ref 3.70–5.45)
lymph#: 0.9 10*3/uL (ref 0.9–3.3)
nRBC: 0 % (ref 0–0)

## 2011-07-29 ENCOUNTER — Encounter (HOSPITAL_BASED_OUTPATIENT_CLINIC_OR_DEPARTMENT_OTHER): Payer: Medicare Other | Admitting: Oncology

## 2011-07-29 DIAGNOSIS — R21 Rash and other nonspecific skin eruption: Secondary | ICD-10-CM

## 2011-07-29 DIAGNOSIS — C189 Malignant neoplasm of colon, unspecified: Secondary | ICD-10-CM

## 2011-07-29 DIAGNOSIS — C787 Secondary malignant neoplasm of liver and intrahepatic bile duct: Secondary | ICD-10-CM

## 2011-08-06 ENCOUNTER — Telehealth: Payer: Self-pay | Admitting: Internal Medicine

## 2011-08-06 NOTE — Telephone Encounter (Addendum)
ROI faxed to [facility] [phone]   08/06/11/km  Refaxed ROI over to [facility] 08/26/11/km  Records received from [facility] gave to [facility]  08/26/11/km

## 2011-08-10 ENCOUNTER — Other Ambulatory Visit: Payer: Self-pay | Admitting: Oncology

## 2011-08-10 ENCOUNTER — Encounter (HOSPITAL_BASED_OUTPATIENT_CLINIC_OR_DEPARTMENT_OTHER): Payer: Medicare Other | Admitting: Oncology

## 2011-08-10 DIAGNOSIS — Z5111 Encounter for antineoplastic chemotherapy: Secondary | ICD-10-CM

## 2011-08-10 DIAGNOSIS — C787 Secondary malignant neoplasm of liver and intrahepatic bile duct: Secondary | ICD-10-CM

## 2011-08-10 DIAGNOSIS — C189 Malignant neoplasm of colon, unspecified: Secondary | ICD-10-CM

## 2011-08-10 LAB — CBC WITH DIFFERENTIAL/PLATELET
Basophils Absolute: 0 10*3/uL (ref 0.0–0.1)
EOS%: 0 % (ref 0.0–7.0)
HCT: 31 % — ABNORMAL LOW (ref 34.8–46.6)
HGB: 10.4 g/dL — ABNORMAL LOW (ref 11.6–15.9)
LYMPH%: 27.7 % (ref 14.0–49.7)
MCH: 32 pg (ref 25.1–34.0)
MCV: 95.4 fL (ref 79.5–101.0)
MONO%: 0.6 % (ref 0.0–14.0)
NEUT%: 71.4 % (ref 38.4–76.8)
Platelets: 189 10*3/uL (ref 145–400)
lymph#: 1 10*3/uL (ref 0.9–3.3)

## 2011-08-10 LAB — COMPREHENSIVE METABOLIC PANEL
ALT: 13 U/L (ref 0–35)
Albumin: 3.7 g/dL (ref 3.5–5.2)
CO2: 17 mEq/L — ABNORMAL LOW (ref 19–32)
Glucose, Bld: 343 mg/dL — ABNORMAL HIGH (ref 70–99)
Potassium: 4.7 mEq/L (ref 3.5–5.3)
Sodium: 134 mEq/L — ABNORMAL LOW (ref 135–145)
Total Bilirubin: 0.8 mg/dL (ref 0.3–1.2)
Total Protein: 7.3 g/dL (ref 6.0–8.3)

## 2011-08-12 ENCOUNTER — Encounter (HOSPITAL_BASED_OUTPATIENT_CLINIC_OR_DEPARTMENT_OTHER): Payer: Medicare Other | Admitting: Oncology

## 2011-08-12 DIAGNOSIS — C189 Malignant neoplasm of colon, unspecified: Secondary | ICD-10-CM

## 2011-08-12 DIAGNOSIS — C787 Secondary malignant neoplasm of liver and intrahepatic bile duct: Secondary | ICD-10-CM

## 2011-08-20 ENCOUNTER — Encounter (INDEPENDENT_AMBULATORY_CARE_PROVIDER_SITE_OTHER): Payer: Medicare Other | Admitting: General Surgery

## 2011-08-23 LAB — HEPATIC FUNCTION PANEL
AST: 28
Albumin: 3.5
Total Protein: 7

## 2011-08-23 LAB — BASIC METABOLIC PANEL
CO2: 25
Calcium: 9.7
Creatinine, Ser: 1.04
GFR calc Af Amer: >60
GFR calc non Af Amer: 53 — ABNORMAL LOW
Glucose, Bld: 261 — ABNORMAL HIGH
Sodium: 137

## 2011-08-23 LAB — CBC
Hemoglobin: 10.3 — ABNORMAL LOW
MCHC: 34.9
RBC: 3.11 — ABNORMAL LOW
RDW: 15.1

## 2011-08-24 ENCOUNTER — Other Ambulatory Visit: Payer: Self-pay | Admitting: Oncology

## 2011-08-24 ENCOUNTER — Encounter (HOSPITAL_BASED_OUTPATIENT_CLINIC_OR_DEPARTMENT_OTHER): Payer: Medicare Other | Admitting: Oncology

## 2011-08-24 DIAGNOSIS — C787 Secondary malignant neoplasm of liver and intrahepatic bile duct: Secondary | ICD-10-CM

## 2011-08-24 DIAGNOSIS — T451X5A Adverse effect of antineoplastic and immunosuppressive drugs, initial encounter: Secondary | ICD-10-CM

## 2011-08-24 DIAGNOSIS — Z5111 Encounter for antineoplastic chemotherapy: Secondary | ICD-10-CM

## 2011-08-24 DIAGNOSIS — C189 Malignant neoplasm of colon, unspecified: Secondary | ICD-10-CM

## 2011-08-24 LAB — CBC WITH DIFFERENTIAL/PLATELET
Basophils Absolute: 0.1 10*3/uL (ref 0.0–0.1)
Eosinophils Absolute: 0.2 10*3/uL (ref 0.0–0.5)
HCT: 30 % — ABNORMAL LOW (ref 34.8–46.6)
LYMPH%: 20.3 % (ref 14.0–49.7)
MCV: 96.2 fL (ref 79.5–101.0)
MONO#: 0.5 10*3/uL (ref 0.1–0.9)
MONO%: 6.2 % (ref 0.0–14.0)
NEUT#: 5.9 10*3/uL (ref 1.5–6.5)
NEUT%: 70.8 % (ref 38.4–76.8)
Platelets: 169 10*3/uL (ref 145–400)
WBC: 8.3 10*3/uL (ref 3.9–10.3)
nRBC: 0 % (ref 0–0)

## 2011-08-24 LAB — COMPREHENSIVE METABOLIC PANEL
Albumin: 3.5 g/dL (ref 3.5–5.2)
Alkaline Phosphatase: 86 U/L (ref 39–117)
BUN: 23 mg/dL (ref 6–23)
Glucose, Bld: 110 mg/dL — ABNORMAL HIGH (ref 70–99)
Total Bilirubin: 0.7 mg/dL (ref 0.3–1.2)

## 2011-08-26 ENCOUNTER — Encounter: Payer: Self-pay | Admitting: Internal Medicine

## 2011-08-27 ENCOUNTER — Encounter: Payer: Self-pay | Admitting: Internal Medicine

## 2011-08-27 ENCOUNTER — Ambulatory Visit (INDEPENDENT_AMBULATORY_CARE_PROVIDER_SITE_OTHER): Payer: Medicare Other | Admitting: Internal Medicine

## 2011-08-27 DIAGNOSIS — I10 Essential (primary) hypertension: Secondary | ICD-10-CM

## 2011-08-27 DIAGNOSIS — E785 Hyperlipidemia, unspecified: Secondary | ICD-10-CM

## 2011-08-27 DIAGNOSIS — R079 Chest pain, unspecified: Secondary | ICD-10-CM

## 2011-08-27 DIAGNOSIS — R5381 Other malaise: Secondary | ICD-10-CM

## 2011-08-27 DIAGNOSIS — R5383 Other fatigue: Secondary | ICD-10-CM

## 2011-08-27 NOTE — Assessment & Plan Note (Signed)
I am not convinced cardiac.  May be related to chemo.  Will order echo.

## 2011-08-27 NOTE — Assessment & Plan Note (Signed)
On statin.  LDL was 137   I think if treating it should be lower.  Will review with pharmacy with chemo

## 2011-08-27 NOTE — Patient Instructions (Signed)
Your physician has requested that you have an echocardiogram. Echocardiography is a painless test that uses sound waves to create images of your heart. It provides your doctor with information about the size and shape of your heart and how well your heart's chambers and valves are working. This procedure takes approximately one hour. There are no restrictions for this procedure.   

## 2011-08-27 NOTE — Assessment & Plan Note (Signed)
BP is good   No change.

## 2011-08-27 NOTE — Progress Notes (Addendum)
HPI Patient is a 75 year old with a history of heart murmur.   Told had x years. Occasional CP  With or without activity.  Sharp.  Lasts about a minute NOt dizzy.  Tired.  Aggravated with chemo.  Lot of fatigue. Can't do like used to. Not doing much.   Getting chemo for colon CA with liver mets. Followed by B. Sherrill.   Allergies  Allergen Reactions  . Avandia (Rosiglitazone Maleate)     Hair loss    Current Outpatient Prescriptions  Medication Sig Dispense Refill  . acetaminophen (TYLENOL) 500 MG tablet Take 500 mg by mouth every 6 (six) hours as needed.        Marland Kitchen amLODipine (NORVASC) 5 MG tablet 1  Tab daily      . aspirin 81 MG tablet Take 81 mg by mouth 2 (two) times daily.       . Cholecalciferol (VITAMIN D-3 PO) Take by mouth. 5 000 units daily       . citalopram (CELEXA) 20 MG tablet 1 tab daily      . CYMBALTA 60 MG capsule 1 tab daily      . glipiZIDE (GLUCOTROL XL) 10 MG 24 hr tablet 1 tab daily      . hydrochlorothiazide (HYDRODIURIL) 25 MG tablet 1 tab daily      . HYDROcodone-acetaminophen (VICODIN) 5-500 MG per tablet As needed      . lidocaine-prilocaine (EMLA) cream As needed      . lisinopril (PRINIVIL,ZESTRIL) 20 MG tablet 1 tab dailly      . metFORMIN (GLUCOPHAGE) 1000 MG tablet 1 tab twice a ady      . pantoprazole (PROTONIX) 40 MG tablet 1 tab daily      . pravastatin (PRAVACHOL) 40 MG tablet 1 tab daily      . traMADol (ULTRAM) 50 MG tablet Take 50 mg by mouth every 6 (six) hours as needed.          Past Medical History  Diagnosis Date  . Colon cancer dx'd 2008  . Metastases to the liver dx'd 2008    chemo ongoing  . Diabetes mellitus   . Hypertension     Past Surgical History  Procedure Date  . Llaparoscopic cholecystectomy     No family history on file.  History   Social History  . Marital Status: Married    Spouse Name: N/A    Number of Children: N/A  . Years of Education: N/A   Occupational History  . Not on file.   Social History  Main Topics  . Smoking status: Never Smoker   . Smokeless tobacco: Not on file  . Alcohol Use: Not on file  . Drug Use: Not on file  . Sexually Active: Not on file   Other Topics Concern  . Not on file   Social History Narrative  . No narrative on file    Review of Systems:  All systems reviewed.  They are negative to the above problem except as previously stated.  Vital Signs: BP 140/82  Pulse 83  Ht 5\' 1"  (1.549 m)  Wt 210 lb (95.255 kg)  BMI 39.68 kg/m2  Physical Exam Patient is in NAD  HEENT:  Normocephalic, atraumatic. EOMI, PERRLA.  Neck: JVP is normal. No thyromegaly. No bruits.  Lungs: clear to auscultation. No rales no wheezes.  Heart: Regular rate and rhythm. Normal S1, S2. No S3.   No significant murmurs. PMI not displaced.  Abdomen:  Supple, nontender.  Normal bowel sounds. No masses. No hepatomegaly.  Extremities:   Good distal pulses throughout. No lower extremity edema.  Musculoskeletal :moving all extremities.  Neuro:   alert and oriented x3.  CN II-XII grossly intact.  EKG:  Sinus rhythm  83 bpm.   Assessment and Plan:

## 2011-08-27 NOTE — Assessment & Plan Note (Signed)
Atypical   I do not think cardiac

## 2011-08-28 ENCOUNTER — Encounter (HOSPITAL_BASED_OUTPATIENT_CLINIC_OR_DEPARTMENT_OTHER): Payer: Medicare Other | Admitting: Oncology

## 2011-08-28 DIAGNOSIS — C189 Malignant neoplasm of colon, unspecified: Secondary | ICD-10-CM

## 2011-08-28 DIAGNOSIS — C787 Secondary malignant neoplasm of liver and intrahepatic bile duct: Secondary | ICD-10-CM

## 2011-09-03 ENCOUNTER — Other Ambulatory Visit (HOSPITAL_COMMUNITY): Payer: Self-pay

## 2011-09-06 ENCOUNTER — Other Ambulatory Visit (HOSPITAL_COMMUNITY): Payer: Self-pay | Admitting: Radiology

## 2011-09-09 LAB — BASIC METABOLIC PANEL
BUN: 23
Chloride: 105
Creatinine, Ser: 1.26 — ABNORMAL HIGH
Glucose, Bld: 159 — ABNORMAL HIGH
Potassium: 5.5 — ABNORMAL HIGH

## 2011-09-09 LAB — CBC
HCT: 31.6 — ABNORMAL LOW
MCHC: 33.4
MCV: 85.6
Platelets: 257
RDW: 15.6 — ABNORMAL HIGH

## 2011-09-10 LAB — COMPREHENSIVE METABOLIC PANEL
ALT: 43 — ABNORMAL HIGH
AST: 21
Albumin: 2.4 — ABNORMAL LOW
Albumin: 2.7 — ABNORMAL LOW
BUN: 11
BUN: 7
CO2: 27
CO2: 30
Calcium: 8.1 — ABNORMAL LOW
Chloride: 107
Chloride: 109
Creatinine, Ser: 0.93
Creatinine, Ser: 1.11
GFR calc Af Amer: 43 — ABNORMAL LOW
GFR calc non Af Amer: 49 — ABNORMAL LOW
GFR calc non Af Amer: 60 — ABNORMAL LOW
Glucose, Bld: 133 — ABNORMAL HIGH
Glucose, Bld: 224 — ABNORMAL HIGH
Sodium: 136
Total Bilirubin: 0.7
Total Bilirubin: 0.7
Total Protein: 5.8 — ABNORMAL LOW

## 2011-09-10 LAB — CBC
HCT: 22.7 — ABNORMAL LOW
HCT: 25.8 — ABNORMAL LOW
Hemoglobin: 8.8 — ABNORMAL LOW
MCHC: 33.9
MCHC: 34.1
MCV: 83.4
MCV: 85.8
Platelets: 260
Platelets: 261
RBC: 3.01 — ABNORMAL LOW
RDW: 14.2 — ABNORMAL HIGH
WBC: 9.2
WBC: 9.7

## 2011-09-10 LAB — BASIC METABOLIC PANEL
BUN: 16
Chloride: 102
GFR calc non Af Amer: 27 — ABNORMAL LOW
GFR calc non Af Amer: 52 — ABNORMAL LOW
Potassium: 3.5
Potassium: 4.6
Sodium: 134 — ABNORMAL LOW
Sodium: 141

## 2011-09-10 LAB — CROSSMATCH: Antibody Screen: NEGATIVE

## 2011-09-10 LAB — URINALYSIS, ROUTINE W REFLEX MICROSCOPIC
Hgb urine dipstick: NEGATIVE
Protein, ur: NEGATIVE
Urobilinogen, UA: 0.2

## 2011-09-10 LAB — HEMOGLOBIN A1C
Hgb A1c MFr Bld: 6
Mean Plasma Glucose: 136

## 2011-09-10 LAB — APTT: aPTT: 34

## 2011-09-10 LAB — HEMOGLOBIN AND HEMATOCRIT, BLOOD: Hemoglobin: 10.5 — ABNORMAL LOW

## 2011-09-10 LAB — PROTIME-INR
INR: 1
Prothrombin Time: 13.5

## 2011-09-14 ENCOUNTER — Encounter (HOSPITAL_BASED_OUTPATIENT_CLINIC_OR_DEPARTMENT_OTHER): Payer: Medicare Other | Admitting: Oncology

## 2011-09-14 ENCOUNTER — Other Ambulatory Visit: Payer: Self-pay | Admitting: Oncology

## 2011-09-14 DIAGNOSIS — Z5111 Encounter for antineoplastic chemotherapy: Secondary | ICD-10-CM

## 2011-09-14 DIAGNOSIS — C787 Secondary malignant neoplasm of liver and intrahepatic bile duct: Secondary | ICD-10-CM

## 2011-09-14 DIAGNOSIS — C189 Malignant neoplasm of colon, unspecified: Secondary | ICD-10-CM

## 2011-09-14 LAB — CBC WITH DIFFERENTIAL/PLATELET
BASO%: 0.4 % (ref 0.0–2.0)
EOS%: 2.5 % (ref 0.0–7.0)
LYMPH%: 22.7 % (ref 14.0–49.7)
MCH: 32.8 pg (ref 25.1–34.0)
MCHC: 34 g/dL (ref 31.5–36.0)
MCV: 96.6 fL (ref 79.5–101.0)
MONO#: 0.5 10*3/uL (ref 0.1–0.9)
MONO%: 6.6 % (ref 0.0–14.0)
Platelets: 263 10*3/uL (ref 145–400)
RBC: 3.26 10*6/uL — ABNORMAL LOW (ref 3.70–5.45)
WBC: 7.6 10*3/uL (ref 3.9–10.3)
nRBC: 0 % (ref 0–0)

## 2011-09-16 ENCOUNTER — Encounter (HOSPITAL_BASED_OUTPATIENT_CLINIC_OR_DEPARTMENT_OTHER): Payer: Medicare Other | Admitting: Oncology

## 2011-09-16 DIAGNOSIS — C189 Malignant neoplasm of colon, unspecified: Secondary | ICD-10-CM

## 2011-09-16 DIAGNOSIS — C787 Secondary malignant neoplasm of liver and intrahepatic bile duct: Secondary | ICD-10-CM

## 2011-09-17 ENCOUNTER — Encounter (INDEPENDENT_AMBULATORY_CARE_PROVIDER_SITE_OTHER): Payer: Medicare Other | Admitting: General Surgery

## 2011-09-28 ENCOUNTER — Other Ambulatory Visit: Payer: Self-pay | Admitting: Oncology

## 2011-09-28 ENCOUNTER — Encounter (HOSPITAL_BASED_OUTPATIENT_CLINIC_OR_DEPARTMENT_OTHER): Payer: Medicare Other | Admitting: Oncology

## 2011-09-28 DIAGNOSIS — C189 Malignant neoplasm of colon, unspecified: Secondary | ICD-10-CM

## 2011-09-28 DIAGNOSIS — E119 Type 2 diabetes mellitus without complications: Secondary | ICD-10-CM

## 2011-09-28 DIAGNOSIS — Z5111 Encounter for antineoplastic chemotherapy: Secondary | ICD-10-CM

## 2011-09-28 DIAGNOSIS — C787 Secondary malignant neoplasm of liver and intrahepatic bile duct: Secondary | ICD-10-CM

## 2011-09-28 LAB — COMPREHENSIVE METABOLIC PANEL
AST: 18 U/L (ref 0–37)
Albumin: 3.9 g/dL (ref 3.5–5.2)
Alkaline Phosphatase: 87 U/L (ref 39–117)
Potassium: 4 mEq/L (ref 3.5–5.3)
Sodium: 138 mEq/L (ref 135–145)
Total Bilirubin: 0.6 mg/dL (ref 0.3–1.2)
Total Protein: 6.3 g/dL (ref 6.0–8.3)

## 2011-09-28 LAB — CBC WITH DIFFERENTIAL/PLATELET
BASO%: 0.4 % (ref 0.0–2.0)
EOS%: 2.6 % (ref 0.0–7.0)
MCH: 32.1 pg (ref 25.1–34.0)
MCHC: 33.7 g/dL (ref 31.5–36.0)
MONO%: 5.6 % (ref 0.0–14.0)
RBC: 3.18 10*6/uL — ABNORMAL LOW (ref 3.70–5.45)
RDW: 14.9 % — ABNORMAL HIGH (ref 11.2–14.5)
lymph#: 1.6 10*3/uL (ref 0.9–3.3)

## 2011-09-30 ENCOUNTER — Encounter (HOSPITAL_BASED_OUTPATIENT_CLINIC_OR_DEPARTMENT_OTHER): Payer: PRIVATE HEALTH INSURANCE | Admitting: Oncology

## 2011-09-30 DIAGNOSIS — Z452 Encounter for adjustment and management of vascular access device: Secondary | ICD-10-CM

## 2011-09-30 DIAGNOSIS — C787 Secondary malignant neoplasm of liver and intrahepatic bile duct: Secondary | ICD-10-CM

## 2011-09-30 DIAGNOSIS — C189 Malignant neoplasm of colon, unspecified: Secondary | ICD-10-CM

## 2011-10-11 ENCOUNTER — Other Ambulatory Visit: Payer: Self-pay | Admitting: Oncology

## 2011-10-11 DIAGNOSIS — C787 Secondary malignant neoplasm of liver and intrahepatic bile duct: Secondary | ICD-10-CM

## 2011-10-11 DIAGNOSIS — C189 Malignant neoplasm of colon, unspecified: Secondary | ICD-10-CM | POA: Insufficient documentation

## 2011-10-12 ENCOUNTER — Other Ambulatory Visit (HOSPITAL_BASED_OUTPATIENT_CLINIC_OR_DEPARTMENT_OTHER): Payer: Medicare Other | Admitting: Lab

## 2011-10-12 ENCOUNTER — Other Ambulatory Visit: Payer: Self-pay | Admitting: Oncology

## 2011-10-12 ENCOUNTER — Ambulatory Visit (HOSPITAL_BASED_OUTPATIENT_CLINIC_OR_DEPARTMENT_OTHER): Payer: Medicare Other

## 2011-10-12 DIAGNOSIS — Z5111 Encounter for antineoplastic chemotherapy: Secondary | ICD-10-CM

## 2011-10-12 DIAGNOSIS — C787 Secondary malignant neoplasm of liver and intrahepatic bile duct: Secondary | ICD-10-CM

## 2011-10-12 DIAGNOSIS — C189 Malignant neoplasm of colon, unspecified: Secondary | ICD-10-CM

## 2011-10-12 LAB — CBC WITH DIFFERENTIAL/PLATELET
Basophils Absolute: 0.1 10*3/uL (ref 0.0–0.1)
EOS%: 2.2 % (ref 0.0–7.0)
Eosinophils Absolute: 0.2 10*3/uL (ref 0.0–0.5)
HGB: 10.5 g/dL — ABNORMAL LOW (ref 11.6–15.9)
LYMPH%: 28.2 % (ref 14.0–49.7)
MCH: 32.4 pg (ref 25.1–34.0)
MCV: 96.9 fL (ref 79.5–101.0)
MONO%: 6 % (ref 0.0–14.0)
Platelets: 330 10*3/uL (ref 145–400)
RDW: 14.3 % (ref 11.2–14.5)

## 2011-10-12 LAB — COMPREHENSIVE METABOLIC PANEL
ALT: 28 U/L (ref 0–35)
AST: 25 U/L (ref 0–37)
Creatinine, Ser: 1.14 mg/dL — ABNORMAL HIGH (ref 0.50–1.10)
Sodium: 136 mEq/L (ref 135–145)
Total Bilirubin: 1 mg/dL (ref 0.3–1.2)
Total Protein: 6.4 g/dL (ref 6.0–8.3)

## 2011-10-12 MED ORDER — ONDANSETRON HCL 8 MG PO TABS
8.0000 mg | ORAL_TABLET | Freq: Once | ORAL | Status: AC
  Administered 2011-10-12: 8 mg via ORAL

## 2011-10-12 MED ORDER — DEXTROSE 5 % IV SOLN: Freq: Once | INTRAVENOUS | Status: DC

## 2011-10-12 MED ORDER — SODIUM CHLORIDE 0.9 % IJ SOLN
10.0000 mL | INTRAMUSCULAR | Status: DC | PRN
  Filled 2011-10-12: qty 10

## 2011-10-12 MED ORDER — FLUOROURACIL CHEMO INJECTION 500 MG/10ML
234.0000 mg/m2 | Freq: Once | INTRAVENOUS | Status: AC
  Administered 2011-10-12: 500 mg via INTRAVENOUS
  Filled 2011-10-12: qty 10

## 2011-10-12 MED ORDER — SODIUM CHLORIDE 0.9 % IV SOLN
1500.0000 mg/m2 | INTRAVENOUS | Status: DC
  Administered 2011-10-12: 3050 mg via INTRAVENOUS
  Filled 2011-10-12: qty 61

## 2011-10-12 MED ORDER — LEUCOVORIN CALCIUM INJECTION 350 MG
285.0000 mg/m2 | Freq: Once | INTRAVENOUS | Status: AC
  Administered 2011-10-12: 578 mg via INTRAVENOUS
  Filled 2011-10-12: qty 28.9

## 2011-10-12 NOTE — Patient Instructions (Signed)
Pt discharged at 1405.  Pt instructed to notify MD for s&s as instructed.

## 2011-10-14 ENCOUNTER — Ambulatory Visit (HOSPITAL_BASED_OUTPATIENT_CLINIC_OR_DEPARTMENT_OTHER): Payer: Medicare Other

## 2011-10-14 DIAGNOSIS — Z452 Encounter for adjustment and management of vascular access device: Secondary | ICD-10-CM

## 2011-10-14 DIAGNOSIS — C189 Malignant neoplasm of colon, unspecified: Secondary | ICD-10-CM

## 2011-10-14 DIAGNOSIS — C787 Secondary malignant neoplasm of liver and intrahepatic bile duct: Secondary | ICD-10-CM

## 2011-10-14 MED ORDER — SODIUM CHLORIDE 0.9 % IJ SOLN
10.0000 mL | Freq: Once | INTRAMUSCULAR | Status: AC
  Administered 2011-10-14: 10 mL via INTRAVENOUS
  Filled 2011-10-14: qty 10

## 2011-10-14 MED ORDER — HEPARIN SOD (PORK) LOCK FLUSH 100 UNIT/ML IV SOLN
500.0000 [IU] | Freq: Once | INTRAVENOUS | Status: AC
  Administered 2011-10-14: 500 [IU] via INTRAVENOUS
  Filled 2011-10-14: qty 5

## 2011-10-14 NOTE — Patient Instructions (Addendum)
Instructed patient to call with any problems.  Patient aware of next appointment 

## 2011-10-25 ENCOUNTER — Ambulatory Visit (HOSPITAL_COMMUNITY)
Admission: RE | Admit: 2011-10-25 | Discharge: 2011-10-25 | Disposition: A | Discharge status: Home / Self Care | Payer: Medicare Other | Source: Ambulatory Visit | Attending: Oncology | Admitting: Oncology

## 2011-10-25 DIAGNOSIS — C787 Secondary malignant neoplasm of liver and intrahepatic bile duct: Secondary | ICD-10-CM | POA: Insufficient documentation

## 2011-10-25 DIAGNOSIS — C189 Malignant neoplasm of colon, unspecified: Secondary | ICD-10-CM | POA: Insufficient documentation

## 2011-10-25 DIAGNOSIS — Z9089 Acquired absence of other organs: Secondary | ICD-10-CM | POA: Insufficient documentation

## 2011-10-25 MED ORDER — IOHEXOL 300 MG/ML  SOLN
100.0000 mL | Freq: Once | INTRAMUSCULAR | Status: AC | PRN
  Administered 2011-10-25: 100 mL via INTRAVENOUS

## 2011-10-26 ENCOUNTER — Telehealth: Payer: Self-pay | Admitting: Oncology

## 2011-10-26 ENCOUNTER — Ambulatory Visit: Payer: Self-pay

## 2011-10-26 ENCOUNTER — Other Ambulatory Visit: Payer: Self-pay | Admitting: Oncology

## 2011-10-26 ENCOUNTER — Ambulatory Visit (HOSPITAL_BASED_OUTPATIENT_CLINIC_OR_DEPARTMENT_OTHER): Payer: Medicare Other | Admitting: Oncology

## 2011-10-26 ENCOUNTER — Other Ambulatory Visit (HOSPITAL_BASED_OUTPATIENT_CLINIC_OR_DEPARTMENT_OTHER): Payer: Medicare Other

## 2011-10-26 VITALS — BP 109/59 | HR 77 | Temp 96.9°F | Ht 61.0 in | Wt 204.5 lb

## 2011-10-26 DIAGNOSIS — C787 Secondary malignant neoplasm of liver and intrahepatic bile duct: Secondary | ICD-10-CM

## 2011-10-26 DIAGNOSIS — C184 Malignant neoplasm of transverse colon: Secondary | ICD-10-CM

## 2011-10-26 DIAGNOSIS — T451X5A Adverse effect of antineoplastic and immunosuppressive drugs, initial encounter: Secondary | ICD-10-CM

## 2011-10-26 DIAGNOSIS — C785 Secondary malignant neoplasm of large intestine and rectum: Secondary | ICD-10-CM

## 2011-10-26 DIAGNOSIS — C189 Malignant neoplasm of colon, unspecified: Secondary | ICD-10-CM

## 2011-10-26 LAB — CBC WITH DIFFERENTIAL/PLATELET
BASO%: 0.4 % (ref 0.0–2.0)
EOS%: 3 % (ref 0.0–7.0)
HCT: 32.1 % — ABNORMAL LOW (ref 34.8–46.6)
LYMPH%: 26.2 % (ref 14.0–49.7)
MCH: 32.7 pg (ref 25.1–34.0)
MCHC: 33.6 g/dL (ref 31.5–36.0)
MCV: 97.3 fL (ref 79.5–101.0)
MONO%: 6 % (ref 0.0–14.0)
NEUT%: 64.4 % (ref 38.4–76.8)
Platelets: 222 10*3/uL (ref 145–400)
RBC: 3.3 10*6/uL — ABNORMAL LOW (ref 3.70–5.45)

## 2011-10-26 NOTE — Progress Notes (Signed)
OFFICE PROGRESS NOTE   INTERVAL HISTORY:   [name]  returns as scheduled. She was last treated with 5 fluorouracil on November 13. She complains of generalized weakness and "dizziness". She has rash over the arms with associated pruritus.  Objective:  Vital signs in last 24 hours:  Blood pressure 109/59, pulse 77, temperature 96.9 F (36.1 C), temperature source Oral, height 5\' 1"  (1.549 m), weight 204 lb 8 oz (92.761 kg).    HEENT: Neck without mass Lymphatics: No cervical, supraclavicular, axillary, or inguinal nodes Resp: Lungs clear bilaterally Cardio: Regular rate and rhythm GI: Abdomen soft and nontender. No hepatosplenomegaly. No mass Vascular: No leg edema Neuro: Alert and oriented. Finger to nose testing is normal. The gait is normal.  Skin: Hyperpigmentation in the inframammary and groin folds bilaterally. There are excoriations over the arms without a rash.   Portacath/PICC-without erythema  Lab Results:  CBC  Lab Results  Component Value Date   WBC 9.2 10/26/2011   HGB 10.8* 10/26/2011   HCT 32.1* 10/26/2011   MCV 97.3 10/26/2011   PLT 222 10/26/2011    Chemistry:      Component Value Date/Time   NA 136 10/12/2011 0917   NA 136 10/12/2011 0917   K 4.3 10/12/2011 0917   K 4.3 10/12/2011 0917   CL 102 10/12/2011 0917   CL 102 10/12/2011 0917   CO2 21 10/12/2011 0917   CO2 21 10/12/2011 0917   GLUCOSE 144* 10/12/2011 0917   GLUCOSE 144* 10/12/2011 0917   BUN 22 10/12/2011 0917   BUN 22 10/12/2011 0917   CREATININE 1.14* 10/12/2011 0917   CREATININE 1.14* 10/12/2011 0917   CREATININE 1.29* 05/20/2009 1423   CALCIUM 9.4 10/12/2011 0917   CALCIUM 9.4 10/12/2011 0917   PROT 6.4 10/12/2011 0917   PROT 6.4 10/12/2011 0917   ALBUMIN 3.8 10/12/2011 0917   ALBUMIN 3.8 10/12/2011 0917   AST 25 10/12/2011 0917   AST 25 10/12/2011 0917   ALT 28 10/12/2011 0917   ALT 28 10/12/2011 0917   ALKPHOS 162* 10/12/2011 0917   ALKPHOS 162* 10/12/2011 0917   BILITOT 1.0 10/12/2011 0917   BILITOT 1.0 10/12/2011 0917   GFRNONAA 53* 02/13/2008 1535   GFRAA  Value: >60        The eGFR has been calculated using the MDRD equation. This calculation has not been validated in all clinical 02/13/2008 1535      Studies/Results:  Ct Abdomen W Contrast  10/25/2011  *RADIOLOGY REPORT*  Clinical Data:  Colon cancer liver metastasis  CT ABDOMEN WITH CONTRAST  Technique:  Multidetector CT imaging of the abdomen was performed using the standard protocol following bolus administration of intravenous contrast.  Contrast: OMNIPAQUE IOHEXOL 300 MG/ML IV SOLN  Comparison:  CT 07/13 1012, PET CT 03/22/2011, MRI 03/02/2011 and  Findings:  Lung bases are clear.  The lesion within the subcapsular  right hepatic lobe is more conspicuous than on prior measuring 2.7 x 1.5 cm compared to 1.8 x 1.5 cm on prior. Hypodensity in the inferior right hepatic lobe measures 8 mm (image 28) and is also more conspicuous than on prior.  This is a  site of metastasis seen on comparison MRI from 03/02/2011.  Gallbladder surgically absent.  The pancreas, spleen, adrenal glands, and kidneys are stable.  The stomach limited view of the small bowel and colon are normal. Abdominal aorta is normal caliber.  No retroperitoneal periportal lymphadenopathy. Review of  bone windows demonstrates no aggressive osseous lesions.  IMPRESSION:  Two sites of prior metastasis are more conspicuous.      Concern for hepatic metastasis recurrence.  Correlate with CEA and the consider  contrast MRI or PET scan for further evaluation.  Original Report Authenticated By: Genevive Bi, M.D.    Medications: I have reviewed the patient's current medications.  Assessment/Plan: 1. Metastatic colon cancer presenting with synchronous colon primaries in September, 2008, status post a hemicolectomy and liver biopsy 08/03/2007.  A CT on 04/20/2010 revealed no evidence metastatic disease. She was maintained on 5-FU/leucovorin  per the FOLFOX regimen beginning in April, 2009.  Avasta was discontinued from the chemotherapy regimen beginning in November, 2009 due to nephrotic range proteinuria. A CT of the abdomen and pelvis 02/05/2011 revealed evidence of three small liver lesions. An MRI of the abdomen on 03/02/2011 confirmed 3 liver lesions and no additional evidence of metastatic disease.  K-RAS testing on the transverse colon and left colon tumors revealed a wild type genotype.  1A.  Staging PET scan 03/21/2011 confirmed 3 hypermetabolic liver lesions and no additional evidence of metastatic disease.  1B.  Surgical consultation by Dr. Donell Beers was obtained and Emily Lawson decided against an attempt at surgical resection/radiofrequency ablation.  1C. Salvage therapy with FOLFOX was initiated 04/13/2011.    1D. A restaging CT 06/28/2011 revealed a significant improvement in the hepatic metastasis.  1E.  She completed eight cycles of FOLFOX chemotherapy. Treatment was switched to 5FU-leucovorin as per the FOLFOX regimen beginning with cycle #9 due to an allergic reaction (skin rash) following cycle #8.   1. Diabetes. 2. History of proteinuria secondary to Avastin versus diabetes- improved. 3. Mucositis and hand foot syndrome, secondary to 5FU improved with a dose reduction of the 5-FU. 4. Anemia secondary to chemotherapy. 5. Left back and left leg pain with an MRI of the lumbar spine confirming lumbar disk disease.  She is status post surgery by Dr. Franky Macho 02/14/2008 with resolution of the back and left leg pain. 6. History of rosacea.  7. Depression, maintained on Lexapro. 8. History of bilateral knee pain secondary to arthritis -- she takes Vicodin as needed. 9. History of anorexia -- weight loss -- ?Related to depression/anxiety or metastatic colon cancer. 10. Allergic reaction to oxaliplatin with FOLFOX on 04/13/2011.  She was able to tolerate FOLFOX on 04/20/2011 with steroid premedication and a prolonged oxaliplatin  infusion.  After cycle #8 she developed a rash upon returning home with pruritus. She took Benadryl with resolution of symptoms. This reaction occurred despite steroid premedication. 11. Poor dentition -- she has been evaluated by Dr. Dalene Carrow. She has elected to delay recommended tooth extractions.    Disposition:  I discussed a restaging CT evaluation with Emily Lawson and her daughter. The known metastatic liver lesions have increased in size compared to the CT from July. We discussed treatment options including salvage chemotherapy, observation, and referring her to Dr. Donell Beers to consider operative or radiologic guided RFA. The chance of responding to additional oxaliplatin based therapy is low. We can consider Irinotecan and enrollment on the regorafenib trial. She would like to see Dr. Donell Beers to discuss the possibility of a hepatectomy procedure or RFA.  We will cancel further chemotherapy for now. She will return proximal visit and Port-A-Cath flush on December 27.   Lucile Shutters, MD  10/26/2011  1:28 PM

## 2011-10-26 NOTE — Telephone Encounter (Signed)
Called Dr Arita Miss office, got appt for pt on 12/18 took appt and informed Dr. Truett Perna. All chemo cancelled per MD. Jovita Gamma calendar to pt for December

## 2011-10-29 ENCOUNTER — Telehealth (INDEPENDENT_AMBULATORY_CARE_PROVIDER_SITE_OTHER): Payer: Self-pay

## 2011-10-29 NOTE — Telephone Encounter (Signed)
LMOM with Dr. Lodema Pilot office that Dr. Donell Beers was available 11/02/11 at 12:30pm to put in pt's [facility]

## 2011-11-11 ENCOUNTER — Ambulatory Visit (INDEPENDENT_AMBULATORY_CARE_PROVIDER_SITE_OTHER): Payer: Medicare Other | Admitting: General Surgery

## 2011-11-11 ENCOUNTER — Encounter (INDEPENDENT_AMBULATORY_CARE_PROVIDER_SITE_OTHER): Payer: Self-pay | Admitting: General Surgery

## 2011-11-11 VITALS — BP 132/70 | HR 72 | Temp 97.4°F | Resp 18 | Ht 61.0 in | Wt 206.4 lb

## 2011-11-11 DIAGNOSIS — C787 Secondary malignant neoplasm of liver and intrahepatic bile duct: Secondary | ICD-10-CM

## 2011-11-11 NOTE — Assessment & Plan Note (Addendum)
Refer to IR for microwave ablation.    A long discussion with the patient and her daughter that took 30-45 minutes. We discussed the options at this point for treating her colon cancer in her liver. She certainly on paper is not a bad candidate for hepatectomy, but she still appears very weak and is not excited about undergoing surgery. Her overall mobility appears fair, but not great.  She has basically been off chemotherapy continuously for around 4 years. I think a microwave ablation will potentially provide a good interval solution for her right sided liver lesions.  Additionally if she does recur or have a poor response to ablation, been off chemotherapy may provide her with more energy to decide that she wants to have surgery. I would hold off on Y. 90 therapy at this point as her disease appears to be recently localized. Also I don't think that she needs cyberknife at this point.  I will refer her to interventional radiology for discussion of ablation.  I advised her that if she wants to undergo surgery after talking with them that I am happy to see her back and discuss surgery at that point.

## 2011-11-11 NOTE — Progress Notes (Signed)
HISTORY: Patient is a 75 year old female who has been doing with colon cancer since 2008. She presented with stage IV disease and underwent oxaliplatin-based chemotherapy at that time. She had a good response to the treatment, however had significant side effects. She has not had as good response to other forms of chemotherapy. She continues to have 2 lesions on the right side of her liver. One appears to be around 2-3 cm and the other is 8 mm. I saw her once back in April and at that point she wanted to continue chemotherapy. Since that time she has had 3 lesions go down to be potentially just one main lesion with a very small satellite lesion. She is feeling very weak. She just finished chemotherapy around 2 weeks ago. Her husband also been in the hospital as well with what sounds like a diverticular bleed or diverticulitis.   PERTINENT REVIEW OF SYSTEMS: Positive for chills, hearing loss, nasal congestion, leg swelling, arthritis pains, and weakness.     EXAM: Head: Normocephalic and atraumatic.  Eyes:  Conjunctivae are normal. Pupils are equal, round, and reactive to light. No scleral icterus.  Neck:  Normal range of motion. Neck supple. Resp: No respiratory distress, normal effort. Abd:  Abdomen is soft, non distended and non tender. There is no rebound and no guarding.  Neurological: Alert and oriented to person, place, and time. Coordination normal.  Skin: Skin is warm and dry. No rash noted. No diaphoretic. No erythema. No pallor.  Psychiatric: Normal mood and affect. Normal behavior. Judgment and thought content normal.     ASSESSMENT AND PLAN:   Metastatic adenocarcinoma to liver Refer to IR for microwave ablation.    A long discussion with the patient and her daughter that took 30-45 minutes. We discussed the options at this point for treating her colon cancer in her liver. She certainly on paper is not a bad candidate for hepatectomy, but she still appears very weak and is not  excited about undergoing surgery. Her overall mobility appears fair, but not great.  She has basically been off chemotherapy continuously for around 4 years. I think a microwave ablation will potentially provide a good interval solution for her right sided liver lesions.  Additionally if she does recur or have a poor response to ablation, been off chemotherapy may provide her with more energy to decide that she wants to have surgery. I would hold off on Y. 90 therapy at this point as her disease appears to be recently localized. Also I don't think that she needs cyberknife at this point.  I will refer her to interventional radiology for discussion of ablation.  I advised her that if she wants to undergo surgery after talking with them that I am happy to see her back and discuss surgery at that point.     Maudry Diego, MD Surgical Oncology, General & Endocrine Surgery [facility]  Lolita Patella, MD Lolita Patella*

## 2011-11-11 NOTE — Patient Instructions (Signed)
Follow up with interventional radiology to discuss microwave ablation.  Follow up with Dr. Truett Perna.

## 2011-11-16 ENCOUNTER — Encounter (INDEPENDENT_AMBULATORY_CARE_PROVIDER_SITE_OTHER): Payer: Medicare Other | Admitting: General Surgery

## 2011-11-25 ENCOUNTER — Telehealth: Payer: Self-pay | Admitting: Oncology

## 2011-11-25 ENCOUNTER — Ambulatory Visit: Payer: Self-pay | Admitting: Oncology

## 2011-11-25 NOTE — Telephone Encounter (Signed)
Pt called in to r/s her appt that she cancelled. Advised the pt that dr sherrril is full will need to speak with him regarding a f/u appt sooner in jan. Pt is aware.

## 2011-11-26 ENCOUNTER — Ambulatory Visit (HOSPITAL_BASED_OUTPATIENT_CLINIC_OR_DEPARTMENT_OTHER): Payer: Medicare Other | Admitting: Oncology

## 2011-11-26 VITALS — BP 140/56 | HR 79 | Temp 97.0°F | Ht 61.0 in | Wt 208.9 lb

## 2011-11-26 DIAGNOSIS — C189 Malignant neoplasm of colon, unspecified: Secondary | ICD-10-CM

## 2011-11-26 DIAGNOSIS — Z452 Encounter for adjustment and management of vascular access device: Secondary | ICD-10-CM

## 2011-11-26 MED ORDER — HEPARIN SOD (PORK) LOCK FLUSH 100 UNIT/ML IV SOLN
500.0000 [IU] | Freq: Once | INTRAVENOUS | Status: AC
  Administered 2011-11-26: 500 [IU] via INTRAVENOUS
  Filled 2011-11-26: qty 5

## 2011-11-26 MED ORDER — SODIUM CHLORIDE 0.9 % IJ SOLN
10.0000 mL | INTRAMUSCULAR | Status: DC | PRN
  Administered 2011-11-26: 10 mL via INTRAVENOUS
  Filled 2011-11-26: qty 10

## 2011-11-26 NOTE — Progress Notes (Signed)
OFFICE PROGRESS NOTE   INTERVAL HISTORY:   She returns as scheduled. She reports intermittent discomfort at the left lateral chest wall. There are no associated symptoms. She relates the discomfort to a chair she has been using.  She saw Dr. Donell Beers to consider a hepatic resection procedure. She has decided against surgery. Dr. Donell Beers recommends a referral to consider radiofrequency ablation.  Objective:  Vital signs in last 24 hours:  Blood pressure 140/56, pulse 79, temperature 97 F (36.1 C), temperature source Oral, height 5\' 1"  (1.549 m), weight 208 lb 14.4 oz (94.756 kg).    HEENT: Neck without mass Lymphatics: No cervical, supraclavicular, or axillary nodes Resp: Lungs clear bilaterally Cardio: Regular rate and rhythm GI: No hepatomegaly. Nontender. Vascular: No leg edema  Musculoskeletal: Examination of the left axilla and left chest wall is unremarkable   Portacath/PICC-without erythema  Lab Results:  Lab Results  Component Value Date   WBC 9.2 10/26/2011   HGB 10.8* 10/26/2011   HCT 32.1* 10/26/2011   MCV 97.3 10/26/2011   PLT 222 10/26/2011      Medications: I have reviewed the patient's current medications.  Assessment/Plan: 1. Metastatic colon cancer presenting with synchronous colon primaries in September, 2008, status post a hemicolectomy and liver biopsy 08/03/2007. A CT on 04/20/2010 revealed no evidence metastatic disease. She was maintained on 5-FU/leucovorin per the FOLFOX regimen beginning in April, 2009. Avasta was discontinued from the chemotherapy regimen beginning in November, 2009 due to nephrotic range proteinuria. A CT of the abdomen and pelvis 02/05/2011 revealed evidence of three small liver lesions. An MRI of the abdomen on 03/02/2011 confirmed 3 liver lesions and no additional evidence of metastatic disease. K-RAS testing on the transverse colon and left colon tumors revealed a wild type genotype.  1A. Staging PET scan 03/21/2011 confirmed 3  hypermetabolic liver lesions and no additional evidence of metastatic disease.  1B. Surgical consultation by Dr. Donell Beers was obtained and Mrs. Basso decided against an attempt at surgical resection/radiofrequency ablation.  1C. Salvage therapy with FOLFOX was initiated 04/13/2011.  1D. A restaging CT 06/28/2011 revealed a significant improvement in the hepatic metastasis.  1E. She completed eight cycles of FOLFOX chemotherapy. Treatment was switched to 5FU-leucovorin as per the FOLFOX regimen beginning with cycle #9 due to an allergic reaction (skin rash) following cycle #8.  64F. restaging CT 10/25/2011 revealed 2 more conspicuous liver metastases . 1. Diabetes. 2. History of proteinuria secondary to Avastin versus diabetes- improved. 3. Mucositis and hand foot syndrome, secondary to 5FU improved with a dose reduction of the 5-FU. 4. Anemia secondary to chemotherapy. 5. Left back and left leg pain with an MRI of the lumbar spine confirming lumbar disk disease. She is status post surgery by Dr. Franky Macho 02/14/2008 with resolution of the back and left leg pain. 6. History of rosacea.  7. Depression, maintained on Lexapro. 8. History of bilateral knee pain secondary to arthritis -- she takes Vicodin as needed. 9. History of anorexia -- weight loss -- ?Related to depression/anxiety or metastatic colon cancer. 10. Allergic reaction to oxaliplatin with FOLFOX on 04/13/2011. She was able to tolerate FOLFOX on 04/20/2011 with steroid premedication and a prolonged oxaliplatin infusion. After cycle #8 she developed a rash upon returning home with pruritus. She took Benadryl with resolution of symptoms. This reaction occurred despite steroid premedication. 11. Poor dentition -- she has been evaluated by Dr. Dalene Carrow. She has elected to delay recommended tooth extractions.    Disposition:  She appears stable. We discussed treatment  options including observation, salvage systemic chemotherapy, and  radiofrequency ablation of the liver lesions. We decided to make a referral to interventional radiology to consider radiofrequency ablation. The Port-A-Cath was flushed today. She'll return for an office visit and Port-A-Cath flush in 6 weeks.   Lucile Shutters, MD  11/26/2011  5:15 PM

## 2011-11-29 ENCOUNTER — Telehealth: Payer: Self-pay | Admitting: Oncology

## 2011-11-29 NOTE — Telephone Encounter (Signed)
Called Tammie @ [facility] left message for a new referral for Radio Frequency Ablation, waiting for a call back.

## 2011-11-29 NOTE — Telephone Encounter (Signed)
Talked to Tammie from [facility] regarding RFA, she needs order from Dr Truett Perna. Referral cannot be extract by [facility] it needs to be an order. I called Lonna Cobb ML, she will try to put in the order, she not Dr Truett Perna has to put in order when he gets back from vacation.

## 2011-12-01 ENCOUNTER — Encounter: Payer: Self-pay | Admitting: *Deleted

## 2011-12-01 DIAGNOSIS — C801 Malignant (primary) neoplasm, unspecified: Secondary | ICD-10-CM | POA: Insufficient documentation

## 2011-12-01 DIAGNOSIS — C189 Malignant neoplasm of colon, unspecified: Secondary | ICD-10-CM | POA: Insufficient documentation

## 2011-12-01 NOTE — Progress Notes (Signed)
Pt saw Dr Donell Beers re: hepatic resection surgery; pt decided against surgery per Dr Kalman Drape note 11/26/11.

## 2011-12-02 ENCOUNTER — Other Ambulatory Visit: Payer: Self-pay | Admitting: *Deleted

## 2011-12-02 DIAGNOSIS — C787 Secondary malignant neoplasm of liver and intrahepatic bile duct: Secondary | ICD-10-CM

## 2011-12-02 DIAGNOSIS — C189 Malignant neoplasm of colon, unspecified: Secondary | ICD-10-CM

## 2011-12-03 ENCOUNTER — Ambulatory Visit: Payer: Medicare Other | Admitting: Radiation Oncology

## 2011-12-03 ENCOUNTER — Ambulatory Visit: Payer: Medicare Other

## 2011-12-07 ENCOUNTER — Ambulatory Visit
Admission: RE | Admit: 2011-12-07 | Discharge: 2011-12-07 | Disposition: A | Discharge status: Home / Self Care | Payer: Self-pay | Source: Ambulatory Visit | Attending: Oncology | Admitting: Oncology

## 2011-12-07 ENCOUNTER — Other Ambulatory Visit (HOSPITAL_COMMUNITY): Payer: Self-pay | Admitting: Interventional Radiology

## 2011-12-07 ENCOUNTER — Telehealth: Payer: Self-pay | Admitting: Emergency Medicine

## 2011-12-07 DIAGNOSIS — C787 Secondary malignant neoplasm of liver and intrahepatic bile duct: Secondary | ICD-10-CM

## 2011-12-07 DIAGNOSIS — C189 Malignant neoplasm of colon, unspecified: Secondary | ICD-10-CM

## 2011-12-07 NOTE — Telephone Encounter (Signed)
4:35PM- SUSAN, RN W/ DR SHERRILL'S OFFICE WOULD LIKE FOR [place] TO GO AHEAD AND SET UP PET SCAN!!!

## 2011-12-07 NOTE — Telephone Encounter (Signed)
LMOVM FOR RN TO CK WITH DR Truett Perna AND SEE IF HE WANTS TO ORDER PET SCAN OR IF HE WANTS DR YAMAGATA TO ORDER IT.  UNSURE AT THE MOMENT UNTIL PET IS PERFORMED IF WE WILL DO Y-90 OR MICROWAVE ABLATION TO HER LIVER.

## 2011-12-08 ENCOUNTER — Telehealth: Payer: Self-pay | Admitting: Emergency Medicine

## 2011-12-08 NOTE — Telephone Encounter (Signed)
LMOVM AT HOME # TO GO OVER PET SCAN APPT AND PREP.  11:46am- Pt called back to confirm information about PET Scan at [facility] for 12-20-11 at 8:00am/745am to register in radiology dept.  NPO after midnight but can take oral meds with little water in am.

## 2011-12-14 ENCOUNTER — Telehealth: Payer: Self-pay | Admitting: *Deleted

## 2011-12-14 NOTE — Telephone Encounter (Signed)
Patient concerned about taking Cymbalta and "liver disease"--per TV commercial. Told her that her liver functions are normal, she just has some lesions on liver. Explained what the liver does and how to tell if liver is failing. She recently saw movie about person with cancer in spine and asking how we know she does not have cancer in her spine? Told her that colon CA mets to bone is uncommon, but she would be having progressive pain in her back with weakness and numbness in legs with loss of bowel and bladder control. She wants to be sure that Dr. Roxy Horseman is a good radiologist. Reassured her that he is very skilled as are all the radiologists.

## 2011-12-14 NOTE — Telephone Encounter (Signed)
Voice mail requesting MD call her about questions regarding her medications.

## 2011-12-20 ENCOUNTER — Inpatient Hospital Stay (HOSPITAL_COMMUNITY): Admission: RE | Admit: 2011-12-20 | Payer: Self-pay | Source: Ambulatory Visit

## 2011-12-24 ENCOUNTER — Encounter (HOSPITAL_COMMUNITY): Payer: Self-pay

## 2011-12-24 ENCOUNTER — Ambulatory Visit (HOSPITAL_COMMUNITY)
Admission: RE | Admit: 2011-12-24 | Discharge: 2011-12-24 | Disposition: A | Discharge status: Home / Self Care | Payer: Medicare Other | Source: Ambulatory Visit | Attending: Interventional Radiology | Admitting: Interventional Radiology

## 2011-12-24 DIAGNOSIS — C189 Malignant neoplasm of colon, unspecified: Secondary | ICD-10-CM | POA: Insufficient documentation

## 2011-12-24 DIAGNOSIS — N281 Cyst of kidney, acquired: Secondary | ICD-10-CM | POA: Insufficient documentation

## 2011-12-24 DIAGNOSIS — E119 Type 2 diabetes mellitus without complications: Secondary | ICD-10-CM | POA: Insufficient documentation

## 2011-12-24 DIAGNOSIS — K429 Umbilical hernia without obstruction or gangrene: Secondary | ICD-10-CM | POA: Insufficient documentation

## 2011-12-24 DIAGNOSIS — C787 Secondary malignant neoplasm of liver and intrahepatic bile duct: Secondary | ICD-10-CM | POA: Insufficient documentation

## 2011-12-24 LAB — GLUCOSE, CAPILLARY: Glucose-Capillary: 102 mg/dL — ABNORMAL HIGH (ref 70–99)

## 2011-12-24 MED ORDER — FLUDEOXYGLUCOSE F - 18 (FDG) INJECTION
18.5000 | Freq: Once | INTRAVENOUS | Status: AC | PRN
  Administered 2011-12-24: 18.5 via INTRAVENOUS

## 2012-01-03 ENCOUNTER — Telehealth: Payer: Self-pay | Admitting: *Deleted

## 2012-01-03 NOTE — Telephone Encounter (Signed)
Responded to patient's earlier call regarding Dr. Fredia Sorrow has cancelled the RFA. Asking what is the plan? Per Dr. Candice Camp appointment for 2/8 as scheduled and he will discuss in detail with her at that time. Observation vs. FOLFIRI or Cetuximab. Patient notified and stressed that she still has treatment options available, but there is no urgency.

## 2012-01-04 ENCOUNTER — Telehealth: Payer: Self-pay | Admitting: *Deleted

## 2012-01-04 NOTE — Telephone Encounter (Signed)
Daughter has new job and will not be able to be at appointment on 01/07/12. She has a lot of questions for MD and wishes to be called "asap". Very worried. Wants to know why liver ablation is not being done. Forwarded request for call to MD (she was made aware he is not in the office today).

## 2012-01-07 ENCOUNTER — Encounter: Payer: Self-pay | Admitting: *Deleted

## 2012-01-07 ENCOUNTER — Ambulatory Visit (HOSPITAL_BASED_OUTPATIENT_CLINIC_OR_DEPARTMENT_OTHER): Payer: Medicare Other | Admitting: Oncology

## 2012-01-07 DIAGNOSIS — C787 Secondary malignant neoplasm of liver and intrahepatic bile duct: Secondary | ICD-10-CM

## 2012-01-07 DIAGNOSIS — C189 Malignant neoplasm of colon, unspecified: Secondary | ICD-10-CM

## 2012-01-07 NOTE — Progress Notes (Signed)
OFFICE PROGRESS NOTE   INTERVAL HISTORY:   She returns as scheduled. She has no new complaint. She has been evaluated in interventional radiology. Dr.Yamagata feels the liver lesions are not amenable to radiofrequency ablation. She returns to discuss systemic therapy options.  Objective:  Vital signs in last 24 hours:  There were no vitals taken for this visit.    HEENT: Neck without mass Lymphatics: No cervical, supraclavicular, or axillary nodes Resp: Lungs clear bilaterally Cardio: Regular rate and rhythm GI: Nontender, no hepatomegaly Vascular: No leg edema    Portacath/PICC-without erythema    Medications: I have reviewed the patient's current medications.  Assessment/Plan: 1. Metastatic colon cancer presenting with synchronous colon primaries in September, 2008, status post a hemicolectomy and liver biopsy 08/03/2007. A CT on 04/20/2010 revealed no evidence metastatic disease. She was maintained on 5-FU/leucovorin per the FOLFOX regimen beginning in April, 2009. Avasta was discontinued from the chemotherapy regimen beginning in November, 2009 due to nephrotic range proteinuria. A CT of the abdomen and pelvis 02/05/2011 revealed evidence of three small liver lesions. An MRI of the abdomen on 03/02/2011 confirmed 3 liver lesions and no additional evidence of metastatic disease. K-RAS testing on the transverse colon and left colon tumors revealed a wild type genotype.  1A. Staging PET scan 03/21/2011 confirmed 3 hypermetabolic liver lesions and no additional evidence of metastatic disease.  1B. Surgical consultation by Dr. Donell Beers was obtained and Mrs. Navarez decided against an attempt at surgical resection/radiofrequency ablation.  1C. Salvage therapy with FOLFOX was initiated 04/13/2011.  1D. A restaging CT 06/28/2011 revealed a significant improvement in the hepatic metastasis.  1E. She completed eight cycles of FOLFOX chemotherapy. Treatment was switched to 5FU-leucovorin as per  the FOLFOX regimen beginning with cycle #9 due to an allergic reaction (skin rash) following cycle #8.  5F. restaging CT 10/25/2011 revealed 2 more conspicuous liver metastases .  1G-a restaging PET scan 12/24/2011 revealed an increase in the size and metabolic activity of 3 liver metastases 1. Diabetes. 2. History of proteinuria secondary to Avastin versus diabetes- improved. 3. Mucositis and hand foot syndrome, secondary to 5FU improved with a dose reduction of the 5-FU. 4. Anemia secondary to chemotherapy. 5. Left back and left leg pain with an MRI of the lumbar spine confirming lumbar disk disease. She is status post surgery by Dr. Franky Macho 02/14/2008 with resolution of the back and left leg pain. 6. History of rosacea.  7. Depression, maintained on Lexapro. 8. History of bilateral knee pain secondary to arthritis -- she takes Vicodin as needed. 9. History of anorexia -- weight loss -- ?Related to depression/anxiety or metastatic colon cancer. 10. Allergic reaction to oxaliplatin with FOLFOX on 04/13/2011. She was able to tolerate FOLFOX on 04/20/2011 with steroid premedication and a prolonged oxaliplatin infusion. After cycle #8 she developed a rash upon returning home with pruritus. She took Benadryl with resolution of symptoms. This reaction occurred despite steroid premedication. 11. Poor dentition -- she has been evaluated by Dr. Dalene Carrow. She has elected to delay recommended tooth extractions.    Disposition:  She appears stable. She is not a candidate for a radiofrequency ablation procedure at present. I discussed treatment options with Ms.Frieze and her family. We discussed observation, standard FOLFIRI chemotherapy, and enrollment on the FOLFIRI/regorafenib trial. She is interested in enrollment on the clinical trial. She met with a research nurse today.  I discussed the potential toxicities associated with the FOLFIRI chemotherapy regimen and regorafenib. She understands the potential  for nausea/vomiting, mucositis,  hematologic toxicity, diarrhea, and a skin rash. We also discussed the increased risk of thromboembolic disease with regorafenib. She will contact the research nurse within the next few days with her decision regarding enrollment on the clinical trial.  She'll be scheduled for a first cycle of systemic therapy on 01/19/2012. She will return for an office visit and chemotherapy on 02/02/2012.   Lucile Shutters, MD  01/07/2012  9:15 PM

## 2012-01-14 ENCOUNTER — Ambulatory Visit: Payer: Medicare Other | Admitting: *Deleted

## 2012-01-14 DIAGNOSIS — C189 Malignant neoplasm of colon, unspecified: Secondary | ICD-10-CM

## 2012-01-14 NOTE — Progress Notes (Signed)
See Research Consent Form encounter for additional details.  01/17/2012 2:55pm See telephone encounter and Research Consent Form encounter for additional details.

## 2012-01-17 ENCOUNTER — Encounter: Payer: Self-pay | Admitting: *Deleted

## 2012-01-17 ENCOUNTER — Telehealth: Payer: Self-pay | Admitting: *Deleted

## 2012-01-17 NOTE — Telephone Encounter (Signed)
Patient called to report she has decided against the "trial test" but will be here on 2/20 for chemo treatment. MD notified.

## 2012-01-17 NOTE — Telephone Encounter (Signed)
See Research Consent Form encounter - patient declined participation in the study.

## 2012-01-18 ENCOUNTER — Other Ambulatory Visit: Payer: Self-pay | Admitting: Oncology

## 2012-01-19 ENCOUNTER — Ambulatory Visit (HOSPITAL_BASED_OUTPATIENT_CLINIC_OR_DEPARTMENT_OTHER): Payer: Medicare Other

## 2012-01-19 ENCOUNTER — Other Ambulatory Visit: Payer: Medicare Other | Admitting: Lab

## 2012-01-19 DIAGNOSIS — Z5111 Encounter for antineoplastic chemotherapy: Secondary | ICD-10-CM

## 2012-01-19 DIAGNOSIS — C189 Malignant neoplasm of colon, unspecified: Secondary | ICD-10-CM

## 2012-01-19 DIAGNOSIS — C787 Secondary malignant neoplasm of liver and intrahepatic bile duct: Secondary | ICD-10-CM

## 2012-01-19 LAB — CBC WITH DIFFERENTIAL/PLATELET
BASO%: 0.4 % (ref 0.0–2.0)
EOS%: 3.8 % (ref 0.0–7.0)
MCHC: 33.4 g/dL (ref 31.5–36.0)
MONO#: 0.5 10*3/uL (ref 0.1–0.9)
RBC: 3.43 10*6/uL — ABNORMAL LOW (ref 3.70–5.45)
WBC: 10.3 10*3/uL (ref 3.9–10.3)
lymph#: 2 10*3/uL (ref 0.9–3.3)
nRBC: 0 % (ref 0–0)

## 2012-01-19 LAB — COMPREHENSIVE METABOLIC PANEL
ALT: 11 U/L (ref 0–35)
AST: 23 U/L (ref 0–37)
Albumin: 3.6 g/dL (ref 3.5–5.2)
Alkaline Phosphatase: 91 U/L (ref 39–117)
Calcium: 9.5 mg/dL (ref 8.4–10.5)
Chloride: 100 mEq/L (ref 96–112)
Creatinine, Ser: 1.11 mg/dL — ABNORMAL HIGH (ref 0.50–1.10)
Potassium: 4.6 mEq/L (ref 3.5–5.3)

## 2012-01-19 MED ORDER — SODIUM CHLORIDE 0.9 % IV SOLN
Freq: Once | INTRAVENOUS | Status: AC
  Administered 2012-01-19: 10:00:00 via INTRAVENOUS

## 2012-01-19 MED ORDER — DEXAMETHASONE SODIUM PHOSPHATE 4 MG/ML IJ SOLN
20.0000 mg | Freq: Once | INTRAMUSCULAR | Status: AC
  Administered 2012-01-19: 20 mg via INTRAVENOUS

## 2012-01-19 MED ORDER — SODIUM CHLORIDE 0.9 % IV SOLN
1500.0000 mg/m2 | INTRAVENOUS | Status: DC
  Administered 2012-01-19: 3000 mg via INTRAVENOUS
  Filled 2012-01-19: qty 60

## 2012-01-19 MED ORDER — ATROPINE SULFATE 0.4 MG/ML IJ SOLN
0.5000 mg | Freq: Once | INTRAMUSCULAR | Status: AC | PRN
  Administered 2012-01-19: 0.5 mg via INTRAVENOUS
  Filled 2012-01-19: qty 1.25

## 2012-01-19 MED ORDER — ONDANSETRON 16 MG/50ML IVPB (CHCC)
16.0000 mg | Freq: Once | INTRAVENOUS | Status: AC
  Administered 2012-01-19: 16 mg via INTRAVENOUS

## 2012-01-19 MED ORDER — FLUOROURACIL CHEMO INJECTION 500 MG/10ML
234.0000 mg/m2 | Freq: Once | INTRAVENOUS | Status: AC
  Administered 2012-01-19: 450 mg via INTRAVENOUS
  Filled 2012-01-19: qty 9

## 2012-01-19 MED ORDER — DEXTROSE 5 % IV SOLN
180.0000 mg/m2 | Freq: Once | INTRAVENOUS | Status: AC
  Administered 2012-01-19: 362 mg via INTRAVENOUS
  Filled 2012-01-19: qty 18.1

## 2012-01-19 MED ORDER — LEUCOVORIN CALCIUM INJECTION 350 MG
285.0000 mg/m2 | Freq: Once | INTRAVENOUS | Status: AC
  Administered 2012-01-19: 572 mg via INTRAVENOUS
  Filled 2012-01-19: qty 28.6

## 2012-01-19 NOTE — Progress Notes (Signed)
Feels nauseated ,but asking for chicken noodle soup

## 2012-01-19 NOTE — Patient Instructions (Addendum)
[facility] Discharge Instructions for Patients Receiving Chemotherapy  Today you received the following chemotherapy agents : 5FU LEUCOVORIN AND IRINOTECAN To help prevent nausea and vomiting after your treatment, we encourage you to take your nausea medication Begin taking it atas often as prescribed    If you develop nausea and vomiting that is not controlled by your nausea medication, call the clinic. If it is after clinic hours your family physician or the after hours number for the clinic or go to the Emergency Department.   BELOW ARE SYMPTOMS THAT SHOULD BE REPORTED IMMEDIATELY:  *FEVER GREATER THAN 100.5 F  *CHILLS WITH OR WITHOUT FEVER  NAUSEA AND VOMITING THAT IS NOT CONTROLLED WITH YOUR NAUSEA MEDICATION  *UNUSUAL SHORTNESS OF BREATH  *UNUSUAL BRUISING OR BLEEDING  TENDERNESS IN MOUTH AND THROAT WITH OR WITHOUT PRESENCE OF ULCERS  *URINARY PROBLEMS  *BOWEL PROBLEMS  UNUSUAL RASH Items with * indicate a potential emergency and should be followed up as soon as possible.  One of the nurses will contact you 24 hours after your treatment. Please let the nurse know about any problems that you may have experienced. Feel free to call the clinic you have any questions or concerns. The clinic phone number is [phone].   I have been informed and understand all the instructions given to me. I know to contact the clinic, my physician, or go to the Emergency Department if any problems should occur. I do not have any questions at this time, but understand that I may call the clinic during office hours   should I have any questions or need assistance in obtaining follow up care.    __________________________________________  _____________  __________ Signature of Patient or Authorized Representative            Date                   Time    __________________________________________ Nurse's Signature

## 2012-01-21 ENCOUNTER — Ambulatory Visit (HOSPITAL_BASED_OUTPATIENT_CLINIC_OR_DEPARTMENT_OTHER): Payer: Medicare Other

## 2012-01-21 DIAGNOSIS — C787 Secondary malignant neoplasm of liver and intrahepatic bile duct: Secondary | ICD-10-CM

## 2012-01-21 DIAGNOSIS — Z452 Encounter for adjustment and management of vascular access device: Secondary | ICD-10-CM

## 2012-01-21 DIAGNOSIS — C189 Malignant neoplasm of colon, unspecified: Secondary | ICD-10-CM

## 2012-01-21 MED ORDER — SODIUM CHLORIDE 0.9 % IJ SOLN
10.0000 mL | INTRAMUSCULAR | Status: DC | PRN
  Administered 2012-01-21: 10 mL
  Filled 2012-01-21: qty 10

## 2012-01-21 MED ORDER — HEPARIN SOD (PORK) LOCK FLUSH 100 UNIT/ML IV SOLN
500.0000 [IU] | Freq: Once | INTRAVENOUS | Status: AC | PRN
  Administered 2012-01-21: 500 [IU]
  Filled 2012-01-21: qty 5

## 2012-01-31 ENCOUNTER — Other Ambulatory Visit: Payer: Self-pay | Admitting: Oncology

## 2012-02-02 ENCOUNTER — Telehealth: Payer: Self-pay | Admitting: Oncology

## 2012-02-02 ENCOUNTER — Ambulatory Visit: Payer: Self-pay

## 2012-02-02 ENCOUNTER — Other Ambulatory Visit: Payer: Self-pay | Admitting: *Deleted

## 2012-02-02 ENCOUNTER — Ambulatory Visit (HOSPITAL_BASED_OUTPATIENT_CLINIC_OR_DEPARTMENT_OTHER): Payer: Medicare Other | Admitting: Nurse Practitioner

## 2012-02-02 ENCOUNTER — Other Ambulatory Visit (HOSPITAL_BASED_OUTPATIENT_CLINIC_OR_DEPARTMENT_OTHER): Payer: Medicare Other

## 2012-02-02 VITALS — BP 164/69 | HR 83 | Temp 96.8°F | Ht 61.0 in | Wt 208.8 lb

## 2012-02-02 DIAGNOSIS — C189 Malignant neoplasm of colon, unspecified: Secondary | ICD-10-CM

## 2012-02-02 LAB — CBC WITH DIFFERENTIAL/PLATELET
BASO%: 0.9 % (ref 0.0–2.0)
EOS%: 4 % (ref 0.0–7.0)
HCT: 29.9 % — ABNORMAL LOW (ref 34.8–46.6)
LYMPH%: 73.1 % — ABNORMAL HIGH (ref 14.0–49.7)
MCH: 30.1 pg (ref 25.1–34.0)
MCHC: 33.1 g/dL (ref 31.5–36.0)
MCV: 90.9 fL (ref 79.5–101.0)
MONO%: 11.5 % (ref 0.0–14.0)
NEUT%: 10.5 % — ABNORMAL LOW (ref 38.4–76.8)
lymph#: 1.7 10*3/uL (ref 0.9–3.3)

## 2012-02-02 MED ORDER — CIPROFLOXACIN HCL 500 MG PO TABS: 500.0000 mg | ORAL_TABLET | Freq: Two times a day (BID) | ORAL | Status: DC

## 2012-02-02 MED ORDER — LIDOCAINE-PRILOCAINE 2.5-2.5 % EX CREA: TOPICAL_CREAM | CUTANEOUS | Status: DC | PRN

## 2012-02-02 NOTE — Telephone Encounter (Signed)
Gv pt appt for march-april2013 

## 2012-02-02 NOTE — Progress Notes (Signed)
OFFICE PROGRESS NOTE  Interval history:  Emily Lawson is a 76 year old woman with metastatic colon cancer. She began treatment with FOLFIRI chemotherapy on 01/19/2012. She is seen today for scheduled followup.  Emily Lawson reports fatigue. She is not sure if she had nausea/vomiting following the chemotherapy. She did have an episode of nausea with vomiting yesterday. She reports developing abdominal pain and diarrhea during the irinotecan infusion. She was given atropine with improvement. Several days after the chemotherapy she had "soft stools". She developed mouth sores. She also had a headache.  She denies fever. No cough or shortness of breath. No hematuria or dysuria.   Objective: Blood pressure 164/69, pulse 83, temperature 96.8 F (36 C), temperature source Oral, height 5\' 1"  (1.549 m), weight 208 lb 12.8 oz (94.711 kg).  Oropharynx is without thrush or ulceration. Lungs are clear. No wheezes or rales. Regular cardiac rhythm. Port-A-Cath site is without erythema. Abdomen is soft and nontender. No hepatomegaly. Extremities are without edema.  Lab Results: Lab Results  Component Value Date   WBC 2.3* 02/02/2012   HGB 9.9* 02/02/2012   HCT 29.9* 02/02/2012   MCV 90.9 02/02/2012   PLT 249 02/02/2012    Chemistry:    Chemistry      Component Value Date/Time   NA 134* 01/19/2012 0852   K 4.6 01/19/2012 0852   CL 100 01/19/2012 0852   CO2 24 01/19/2012 0852   BUN 29* 01/19/2012 0852   CREATININE 1.11* 01/19/2012 0852   CREATININE 1.29* 05/20/2009 1423   GLU 102* 10/19/2007 1433      Component Value Date/Time   CALCIUM 9.5 01/19/2012 0852   ALKPHOS 91 01/19/2012 0852   AST 23 01/19/2012 0852   ALT 11 01/19/2012 0852   BILITOT 0.3 01/19/2012 0852       Studies/Results: No results found.  Medications: I have reviewed the patient's current medications.  Assessment/Plan:  1. Metastatic colon cancer presenting with synchronous colon primaries in September, 2008, status post a hemicolectomy and  liver biopsy 08/03/2007. A CT on 04/20/2010 revealed no evidence metastatic disease. She was maintained on 5-FU/leucovorin per the FOLFOX regimen beginning in April, 2009. Avasta was discontinued from the chemotherapy regimen beginning in November, 2009 due to nephrotic range proteinuria. A CT of the abdomen and pelvis 02/05/2011 revealed evidence of three small liver lesions. An MRI of the abdomen on 03/02/2011 confirmed 3 liver lesions and no additional evidence of metastatic disease. K-RAS testing on the transverse colon and left colon tumors revealed a wild type genotype.  1A. Staging PET scan 03/21/2011 confirmed 3 hypermetabolic liver lesions and no additional evidence of metastatic disease.  1B. Surgical consultation by Dr. Donell Beers was obtained and Emily Lawson decided against an attempt at surgical resection/radiofrequency ablation.  1C. Salvage therapy with FOLFOX was initiated 04/13/2011.  1D. A restaging CT 06/28/2011 revealed a significant improvement in the hepatic metastasis.  1E. She completed eight cycles of FOLFOX chemotherapy. Treatment was switched to 5FU-leucovorin as per the FOLFOX regimen beginning with cycle #9 due to an allergic reaction (skin rash) following cycle #8.  75F.  Restaging CT 10/25/2011 revealed 2 more conspicuous liver metastases .  1G. Restaging PET scan 12/24/2011 revealed an increase in the size and metabolic activity of 3 liver metastases  1H.  She began FOLFIRI chemotherapy on 01/19/2012. 1. Diabetes. 2. History of proteinuria secondary to Avastin versus diabetes- improved. 3. Mucositis and hand foot syndrome, secondary to 5FU improved with a dose reduction of the 5-FU. 4. Anemia secondary  to chemotherapy. 5. Left back and left leg pain with an MRI of the lumbar spine confirming lumbar disk disease. She is status post surgery by Dr. Franky Macho 02/14/2008 with resolution of the back and left leg pain. 6. History of rosacea.  7. Depression, maintained on  Lexapro. 8. History of bilateral knee pain secondary to arthritis -- she takes Vicodin as needed. 9. History of anorexia -- weight loss -- ?Related to depression/anxiety or metastatic colon cancer. 10. Allergic reaction to oxaliplatin with FOLFOX on 04/13/2011. She was able to tolerate FOLFOX on 04/20/2011 with steroid premedication and a prolonged oxaliplatin infusion. After cycle #8 she developed a rash upon returning home with pruritus. She took Benadryl with resolution of symptoms. This reaction occurred despite steroid premedication. 11. Poor dentition -- she has been evaluated by Dr. Dalene Carrow. She has elected to delay recommended tooth extractions 12. Neutropenia following cycle 1 of FOLFIRI.  Disposition-Emily Lawson has severe neutropenia following cycle 1 of FOLFIRI. We will hold today's treatment and reschedule for 02/08/2012. She will begin prophylactic Cipro 500 mg twice daily. She was instructed in neutropenic precautions. She is aware that she should contact the office with fever, chills or other signs of infection. I will discuss the addition of Neulasta and/or dose reduction of the irinotecan with cycle 2 FOLFIRI with Dr. Truett Perna upon his return to the office. As noted above she will return for cycle 2 FOLFIRI on 02/08/2012. She requests that cycle 3 be given at a 3 week interval. We will schedule a followup visit and cycle 3 FOLFIRI on 02/29/2012.  Lonna Cobb ANP/GNP-BC

## 2012-02-03 ENCOUNTER — Other Ambulatory Visit: Payer: Self-pay | Admitting: *Deleted

## 2012-02-03 MED ORDER — LIDOCAINE-PRILOCAINE 2.5-2.5 % EX CREA: TOPICAL_CREAM | CUTANEOUS | Status: DC | PRN

## 2012-02-03 MED ORDER — CIPROFLOXACIN HCL 500 MG PO TABS: 500.0000 mg | ORAL_TABLET | Freq: Two times a day (BID) | ORAL | Status: AC

## 2012-02-03 NOTE — Telephone Encounter (Signed)
Pt reports prescriptions should have been sent to [facility]. [facility]. Canceled [facility] rx, and sent rx electronically to [facility].

## 2012-02-06 ENCOUNTER — Other Ambulatory Visit: Payer: Self-pay | Admitting: Oncology

## 2012-02-07 ENCOUNTER — Other Ambulatory Visit: Payer: Self-pay | Admitting: *Deleted

## 2012-02-08 ENCOUNTER — Ambulatory Visit (HOSPITAL_BASED_OUTPATIENT_CLINIC_OR_DEPARTMENT_OTHER): Payer: Medicare Other

## 2012-02-08 ENCOUNTER — Other Ambulatory Visit: Payer: Self-pay | Admitting: Oncology

## 2012-02-08 ENCOUNTER — Other Ambulatory Visit (HOSPITAL_BASED_OUTPATIENT_CLINIC_OR_DEPARTMENT_OTHER): Payer: Medicare Other | Admitting: Lab

## 2012-02-08 DIAGNOSIS — C787 Secondary malignant neoplasm of liver and intrahepatic bile duct: Secondary | ICD-10-CM

## 2012-02-08 DIAGNOSIS — Z5111 Encounter for antineoplastic chemotherapy: Secondary | ICD-10-CM

## 2012-02-08 DIAGNOSIS — C189 Malignant neoplasm of colon, unspecified: Secondary | ICD-10-CM

## 2012-02-08 LAB — CBC WITH DIFFERENTIAL/PLATELET
BASO%: 0.8 % (ref 0.0–2.0)
EOS%: 1.9 % (ref 0.0–7.0)
LYMPH%: 27.8 % (ref 14.0–49.7)
MCH: 30.3 pg (ref 25.1–34.0)
MCHC: 32.9 g/dL (ref 31.5–36.0)
MCV: 92 fL (ref 79.5–101.0)
MONO%: 7.7 % (ref 0.0–14.0)
NEUT#: 5.6 10*3/uL (ref 1.5–6.5)
Platelets: 335 10*3/uL (ref 145–400)
RBC: 3.27 10*6/uL — ABNORMAL LOW (ref 3.70–5.45)
RDW: 13.8 % (ref 11.2–14.5)
nRBC: 0 % (ref 0–0)

## 2012-02-08 LAB — COMPREHENSIVE METABOLIC PANEL
ALT: 9 U/L (ref 0–35)
Alkaline Phosphatase: 100 U/L (ref 39–117)
Creatinine, Ser: 1.29 mg/dL — ABNORMAL HIGH (ref 0.50–1.10)
Sodium: 136 mEq/L (ref 135–145)
Total Bilirubin: 0.3 mg/dL (ref 0.3–1.2)
Total Protein: 6.7 g/dL (ref 6.0–8.3)

## 2012-02-08 LAB — TECHNOLOGIST REVIEW

## 2012-02-08 MED ORDER — HEPARIN SOD (PORK) LOCK FLUSH 100 UNIT/ML IV SOLN
500.0000 [IU] | Freq: Once | INTRAVENOUS | Status: DC | PRN
  Filled 2012-02-08: qty 5

## 2012-02-08 MED ORDER — IRINOTECAN HCL CHEMO INJECTION 100 MG/5ML
140.0000 mg/m2 | Freq: Once | INTRAVENOUS | Status: AC
  Administered 2012-02-08: 282 mg via INTRAVENOUS
  Filled 2012-02-08: qty 14.1

## 2012-02-08 MED ORDER — FLUOROURACIL CHEMO INJECTION 500 MG/10ML
234.0000 mg/m2 | Freq: Once | INTRAVENOUS | Status: AC
  Administered 2012-02-08: 450 mg via INTRAVENOUS
  Filled 2012-02-08: qty 9

## 2012-02-08 MED ORDER — ONDANSETRON 16 MG/50ML IVPB (CHCC)
16.0000 mg | Freq: Once | INTRAVENOUS | Status: AC
  Administered 2012-02-08: 16 mg via INTRAVENOUS

## 2012-02-08 MED ORDER — ATROPINE SULFATE 0.4 MG/ML IJ SOLN
0.5000 mg | Freq: Once | INTRAMUSCULAR | Status: AC | PRN
  Administered 2012-02-08: 0.5 mg via INTRAVENOUS
  Filled 2012-02-08: qty 1.25

## 2012-02-08 MED ORDER — LEUCOVORIN CALCIUM INJECTION 350 MG
285.0000 mg/m2 | Freq: Once | INTRAVENOUS | Status: AC
  Administered 2012-02-08: 572 mg via INTRAVENOUS
  Filled 2012-02-08: qty 28.6

## 2012-02-08 MED ORDER — DEXAMETHASONE SODIUM PHOSPHATE 4 MG/ML IJ SOLN
20.0000 mg | Freq: Once | INTRAMUSCULAR | Status: AC
  Administered 2012-02-08: 20 mg via INTRAVENOUS

## 2012-02-08 MED ORDER — SODIUM CHLORIDE 0.9 % IV SOLN
1500.0000 mg/m2 | INTRAVENOUS | Status: DC
  Administered 2012-02-08: 3000 mg via INTRAVENOUS
  Filled 2012-02-08: qty 60

## 2012-02-08 MED ORDER — SODIUM CHLORIDE 0.9 % IV SOLN
Freq: Once | INTRAVENOUS | Status: AC
  Administered 2012-02-08: 10:00:00 via INTRAVENOUS

## 2012-02-08 MED ORDER — SODIUM CHLORIDE 0.9 % IJ SOLN
10.0000 mL | INTRAMUSCULAR | Status: DC | PRN
  Filled 2012-02-08: qty 10

## 2012-02-10 ENCOUNTER — Ambulatory Visit (HOSPITAL_BASED_OUTPATIENT_CLINIC_OR_DEPARTMENT_OTHER): Payer: Medicare Other

## 2012-02-10 VITALS — BP 133/71 | HR 94 | Temp 98.1°F

## 2012-02-10 DIAGNOSIS — Z452 Encounter for adjustment and management of vascular access device: Secondary | ICD-10-CM

## 2012-02-10 DIAGNOSIS — C189 Malignant neoplasm of colon, unspecified: Secondary | ICD-10-CM

## 2012-02-10 DIAGNOSIS — C787 Secondary malignant neoplasm of liver and intrahepatic bile duct: Secondary | ICD-10-CM

## 2012-02-10 MED ORDER — SODIUM CHLORIDE 0.9 % IJ SOLN
10.0000 mL | INTRAMUSCULAR | Status: DC | PRN
  Administered 2012-02-10: 10 mL
  Filled 2012-02-10: qty 10

## 2012-02-10 MED ORDER — HEPARIN SOD (PORK) LOCK FLUSH 100 UNIT/ML IV SOLN
500.0000 [IU] | Freq: Once | INTRAVENOUS | Status: AC | PRN
  Administered 2012-02-10: 500 [IU]
  Filled 2012-02-10: qty 5

## 2012-02-16 ENCOUNTER — Ambulatory Visit: Payer: Self-pay

## 2012-02-16 ENCOUNTER — Ambulatory Visit: Payer: Self-pay | Admitting: Oncology

## 2012-02-27 ENCOUNTER — Other Ambulatory Visit: Payer: Self-pay | Admitting: Oncology

## 2012-02-29 ENCOUNTER — Ambulatory Visit (HOSPITAL_BASED_OUTPATIENT_CLINIC_OR_DEPARTMENT_OTHER): Payer: Medicare Other

## 2012-02-29 ENCOUNTER — Other Ambulatory Visit (HOSPITAL_BASED_OUTPATIENT_CLINIC_OR_DEPARTMENT_OTHER): Payer: Medicare Other | Admitting: Lab

## 2012-02-29 ENCOUNTER — Ambulatory Visit (HOSPITAL_BASED_OUTPATIENT_CLINIC_OR_DEPARTMENT_OTHER): Payer: Medicare Other | Admitting: Oncology

## 2012-02-29 VITALS — BP 154/81 | HR 86 | Temp 96.5°F | Ht 61.0 in | Wt 209.1 lb

## 2012-02-29 DIAGNOSIS — D6481 Anemia due to antineoplastic chemotherapy: Secondary | ICD-10-CM

## 2012-02-29 DIAGNOSIS — Z5111 Encounter for antineoplastic chemotherapy: Secondary | ICD-10-CM

## 2012-02-29 DIAGNOSIS — C787 Secondary malignant neoplasm of liver and intrahepatic bile duct: Secondary | ICD-10-CM

## 2012-02-29 DIAGNOSIS — C189 Malignant neoplasm of colon, unspecified: Secondary | ICD-10-CM

## 2012-02-29 LAB — TECHNOLOGIST REVIEW: Technologist Review: 4

## 2012-02-29 LAB — CBC WITH DIFFERENTIAL/PLATELET
BASO%: 0.8 % (ref 0.0–2.0)
Eosinophils Absolute: 0.3 10*3/uL (ref 0.0–0.5)
HCT: 31.1 % — ABNORMAL LOW (ref 34.8–46.6)
HGB: 10.2 g/dL — ABNORMAL LOW (ref 11.6–15.9)
LYMPH%: 25.3 % (ref 14.0–49.7)
MCHC: 32.8 g/dL (ref 31.5–36.0)
MONO#: 0.5 10*3/uL (ref 0.1–0.9)
NEUT#: 5 10*3/uL (ref 1.5–6.5)
NEUT%: 64.3 % (ref 38.4–76.8)
Platelets: 326 10*3/uL (ref 145–400)
WBC: 7.7 10*3/uL (ref 3.9–10.3)
lymph#: 1.9 10*3/uL (ref 0.9–3.3)

## 2012-02-29 LAB — COMPREHENSIVE METABOLIC PANEL
ALT: 11 U/L (ref 0–35)
AST: 17 U/L (ref 0–37)
Albumin: 3.8 g/dL (ref 3.5–5.2)
BUN: 23 mg/dL (ref 6–23)
CO2: 21 mEq/L (ref 19–32)
Calcium: 9.5 mg/dL (ref 8.4–10.5)
Chloride: 103 mEq/L (ref 96–112)
Potassium: 5 mEq/L (ref 3.5–5.3)

## 2012-02-29 MED ORDER — ATROPINE SULFATE 0.4 MG/ML IJ SOLN
0.5000 mg | Freq: Once | INTRAMUSCULAR | Status: DC | PRN
  Filled 2012-02-29: qty 1.25

## 2012-02-29 MED ORDER — LEUCOVORIN CALCIUM INJECTION 350 MG
285.0000 mg/m2 | Freq: Once | INTRAVENOUS | Status: AC
  Administered 2012-02-29: 572 mg via INTRAVENOUS
  Filled 2012-02-29: qty 28.6

## 2012-02-29 MED ORDER — FLUOROURACIL CHEMO INJECTION 500 MG/10ML
234.0000 mg/m2 | Freq: Once | INTRAVENOUS | Status: AC
  Administered 2012-02-29: 450 mg via INTRAVENOUS
  Filled 2012-02-29: qty 9

## 2012-02-29 MED ORDER — DEXAMETHASONE SODIUM PHOSPHATE 4 MG/ML IJ SOLN
20.0000 mg | Freq: Once | INTRAMUSCULAR | Status: AC
  Administered 2012-02-29: 20 mg via INTRAVENOUS

## 2012-02-29 MED ORDER — IRINOTECAN HCL CHEMO INJECTION 100 MG/5ML
140.0000 mg/m2 | Freq: Once | INTRAVENOUS | Status: AC
  Administered 2012-02-29: 282 mg via INTRAVENOUS
  Filled 2012-02-29: qty 14.1

## 2012-02-29 MED ORDER — ONDANSETRON 16 MG/50ML IVPB (CHCC)
16.0000 mg | Freq: Once | INTRAVENOUS | Status: AC
  Administered 2012-02-29: 16 mg via INTRAVENOUS

## 2012-02-29 MED ORDER — SODIUM CHLORIDE 0.9 % IV SOLN
Freq: Once | INTRAVENOUS | Status: AC
  Administered 2012-02-29: 14:00:00 via INTRAVENOUS

## 2012-02-29 MED ORDER — FLUOROURACIL CHEMO INJECTION 5 GM/100ML
1500.0000 mg/m2 | INTRAVENOUS | Status: DC
  Administered 2012-02-29: 3000 mg via INTRAVENOUS
  Filled 2012-02-29: qty 60

## 2012-02-29 NOTE — Progress Notes (Signed)
OFFICE PROGRESS NOTE   INTERVAL HISTORY:   She completed a second cycle of FOLFIRI on 02/08/2012 with a dose reduction of the irinotecan. She reports less nausea following this cycle of chemotherapy. She has loose stool but no Frank diarrhea. She complains of malaise. She has developed alopecia.  Objective:  Vital signs in last 24 hours:  Blood pressure 154/81, pulse 86, temperature 96.5 F (35.8 C), temperature source Oral, height 5\' 1"  (1.549 m), weight 209 lb 1.6 oz (94.847 kg).    HEENT: No thrush or ulcers Resp: Lungs clear bilaterally Cardio: Regular rate and rhythm GI: No hepatomegaly Vascular: No leg edema    Portacath/PICC-without erythema  Lab Results:  Lab Results  Component Value Date   WBC 7.7 02/29/2012   HGB 10.2* 02/29/2012   HCT 31.1* 02/29/2012   MCV 94.2 02/29/2012   PLT 326 02/29/2012   ANC 5.0    Medications: I have reviewed the patient's current medications.  Assessment/Plan: 1. Metastatic colon cancer presenting with synchronous colon primaries in September, 2008, status post a hemicolectomy and liver biopsy 08/03/2007. A CT on 04/20/2010 revealed no evidence metastatic disease. She was maintained on 5-FU/leucovorin per the FOLFOX regimen beginning in April, 2009. Avasta was discontinued from the chemotherapy regimen beginning in November, 2009 due to nephrotic range proteinuria. A CT of the abdomen and pelvis 02/05/2011 revealed evidence of three small liver lesions. An MRI of the abdomen on 03/02/2011 confirmed 3 liver lesions and no additional evidence of metastatic disease. K-RAS testing on the transverse colon and left colon tumors revealed a wild type genotype.  1A. Staging PET scan 03/21/2011 confirmed 3 hypermetabolic liver lesions and no additional evidence of metastatic disease.  1B. Surgical consultation by Dr. Donell Beers was obtained and Mrs. Valcarcel decided against an attempt at surgical resection/radiofrequency ablation.  1C. Salvage therapy with FOLFOX  was initiated 04/13/2011.  1D. A restaging CT 06/28/2011 revealed a significant improvement in the hepatic metastasis.  1E. She completed eight cycles of FOLFOX chemotherapy. Treatment was switched to 5FU-leucovorin as per the FOLFOX regimen beginning with cycle #9 due to an allergic reaction (skin rash) following cycle #8.  69F. Restaging CT 10/25/2011 revealed 2 more conspicuous liver metastases .  1G. Restaging PET scan 12/24/2011 revealed an increase in the size and metabolic activity of 3 liver metastases  1H. She began FOLFIRI chemotherapy on 01/19/2012.  1. Diabetes. 2. History of proteinuria secondary to Avastin versus diabetes- improved. 3. Mucositis and hand foot syndrome, secondary to 5FU improved with a dose reduction of the 5-FU. 4. Anemia secondary to chemotherapy. 5. Left back and left leg pain with an MRI of the lumbar spine confirming lumbar disk disease. She is status post surgery by Dr. Franky Macho 02/14/2008 with resolution of the back and left leg pain. 6. History of rosacea.  7. Depression, maintained on Lexapro. 8. History of bilateral knee pain secondary to arthritis -- she takes Vicodin as needed. 9. History of anorexia -- weight loss -- ?Related to depression/anxiety or metastatic colon cancer. 10. Allergic reaction to oxaliplatin with FOLFOX on 04/13/2011. She was able to tolerate FOLFOX on 04/20/2011 with steroid premedication and a prolonged oxaliplatin infusion. After cycle #8 she developed a rash upon returning home with pruritus. She took Benadryl with resolution of symptoms. This reaction occurred despite steroid premedication. 11. Poor dentition -- she has been evaluated by Dr. Dalene Carrow. She has elected to delay recommended tooth extractions 12. Neutropenia following cycle 1 of FOLFIRI. The FOLFIRI was dose reduced beginning with  cycle 2.  Disposition:  She tolerated the second cycle of FOLFIRI well. The neutrophil count is adequate today. The plan is to proceed with  cycle 3 of FOLFIRI today. She will return for an office visit and cycle 4 in 2 weeks.   Lucile Shutters, MD  02/29/2012  2:21 PM

## 2012-03-01 ENCOUNTER — Telehealth: Payer: Self-pay | Admitting: Oncology

## 2012-03-01 NOTE — Telephone Encounter (Signed)
S/w the pt and she is aware to pick up her April 2013 appt calendar when she comes in on thursday

## 2012-03-02 ENCOUNTER — Ambulatory Visit (HOSPITAL_BASED_OUTPATIENT_CLINIC_OR_DEPARTMENT_OTHER): Payer: Medicare Other

## 2012-03-02 VITALS — BP 134/75 | HR 80 | Temp 97.1°F

## 2012-03-02 DIAGNOSIS — Z452 Encounter for adjustment and management of vascular access device: Secondary | ICD-10-CM

## 2012-03-02 DIAGNOSIS — C787 Secondary malignant neoplasm of liver and intrahepatic bile duct: Secondary | ICD-10-CM

## 2012-03-02 DIAGNOSIS — C189 Malignant neoplasm of colon, unspecified: Secondary | ICD-10-CM

## 2012-03-02 MED ORDER — SODIUM CHLORIDE 0.9 % IJ SOLN
10.0000 mL | INTRAMUSCULAR | Status: DC | PRN
  Administered 2012-03-02: 10 mL
  Filled 2012-03-02: qty 10

## 2012-03-02 MED ORDER — HEPARIN SOD (PORK) LOCK FLUSH 100 UNIT/ML IV SOLN
500.0000 [IU] | Freq: Once | INTRAVENOUS | Status: AC | PRN
  Administered 2012-03-02: 500 [IU]
  Filled 2012-03-02: qty 5

## 2012-03-02 NOTE — Patient Instructions (Signed)
[facility] Discharge Instructions for Patients Receiving Chemotherapy  Today you received the following chemotherapy agents   To help prevent nausea and vomiting after your treatment, we encourage you to take your nausea medicationBegin taking it at and take it as often as prescribed for the nextours.   If you develop nausea and vomiting that is not controlled by your nausea medication, call the clinic. If it is after clinic hours your family physician or the after hours number for the clinic or go to the Emergency Department.   BELOW ARE SYMPTOMS THAT SHOULD BE REPORTED IMMEDIATELY:  *FEVER GREATER THAN 100.5 F  *CHILLS WITH OR WITHOUT FEVER  NAUSEA AND VOMITING THAT IS NOT CONTROLLED WITH YOUR NAUSEA MEDICATION  *UNUSUAL SHORTNESS OF BREATH  *UNUSUAL BRUISING OR BLEEDING  TENDERNESS IN MOUTH AND THROAT WITH OR WITHOUT PRESENCE OF ULCERS  *URINARY PROBLEMS  *BOWEL PROBLEMS  UNUSUAL RASH Items with * indicate a potential emergency and should be followed up as soon as possible.  One of the nurses will contact you 24 hours after your treatment. Please let the nurse know about any problems that you may have experienced. Feel free to call the clinic you have any questions or concerns. The clinic phone number is [phone].   I have been informed and understand all the instructions given to me. I know to contact the clinic, my physician, or go to the Emergency Department if any problems should occur. I do not have any questions at this time, but understand that I may call the clinic during office hours   should I have any questions or need assistance in obtaining follow up care.    __________________________________________  _____________  __________ Signature of Patient or Authorized Representative            Date                   Time    __________________________________________ Nurse's Signature

## 2012-03-06 ENCOUNTER — Other Ambulatory Visit: Payer: Self-pay | Admitting: Certified Registered Nurse Anesthetist

## 2012-03-13 ENCOUNTER — Other Ambulatory Visit: Payer: Self-pay | Admitting: Oncology

## 2012-03-14 ENCOUNTER — Inpatient Hospital Stay: Payer: Medicare Other

## 2012-03-14 ENCOUNTER — Telehealth: Payer: Self-pay | Admitting: Oncology

## 2012-03-14 ENCOUNTER — Other Ambulatory Visit (HOSPITAL_BASED_OUTPATIENT_CLINIC_OR_DEPARTMENT_OTHER): Payer: Medicare Other | Admitting: Lab

## 2012-03-14 ENCOUNTER — Ambulatory Visit (HOSPITAL_BASED_OUTPATIENT_CLINIC_OR_DEPARTMENT_OTHER): Payer: Medicare Other | Admitting: Oncology

## 2012-03-14 VITALS — BP 149/71 | HR 82 | Temp 96.6°F | Ht 61.0 in | Wt 206.7 lb

## 2012-03-14 DIAGNOSIS — C189 Malignant neoplasm of colon, unspecified: Secondary | ICD-10-CM

## 2012-03-14 LAB — CBC WITH DIFFERENTIAL/PLATELET
BASO%: 1.4 % (ref 0.0–2.0)
EOS%: 6.8 % (ref 0.0–7.0)
HCT: 27.9 % — ABNORMAL LOW (ref 34.8–46.6)
LYMPH%: 50.7 % — ABNORMAL HIGH (ref 14.0–49.7)
MCH: 30.8 pg (ref 25.1–34.0)
MCHC: 33.3 g/dL (ref 31.5–36.0)
NEUT%: 32.1 % — ABNORMAL LOW (ref 38.4–76.8)
lymph#: 1.4 10*3/uL (ref 0.9–3.3)

## 2012-03-14 NOTE — Progress Notes (Signed)
OFFICE PROGRESS NOTE   INTERVAL HISTORY:   She returns as scheduled. She completed another cycle of FOLFIRI on 02/29/2012. No nausea or diarrhea following chemotherapy. Her chief complaint is malaise. She also has intermittent "dizziness ".  Objective:  Vital signs in last 24 hours:  Blood pressure 149/71, pulse 82, temperature 96.6 F (35.9 C), temperature source Oral, height 5\' 1"  (1.549 m), weight 206 lb 11.2 oz (93.759 kg).    HEENT: No thrush or ulcers Resp: Lungs clear bilaterally Cardio: Regular rate and rhythm GI: No hepatomegaly, nontender Vascular: No leg edema   Portacath/PICC-without erythema  Lab Results:  Lab Results  Component Value Date   WBC 2.8* 03/14/2012   HGB 9.3* 03/14/2012   HCT 27.9* 03/14/2012   MCV 92.4 03/14/2012   PLT 266 03/14/2012      Medications: I have reviewed the patient's current medications.  Assessment/Plan: 1. Metastatic colon cancer presenting with synchronous colon primaries in September, 2008, status post a hemicolectomy and liver biopsy 08/03/2007. A CT on 04/20/2010 revealed no evidence metastatic disease. She was maintained on 5-FU/leucovorin per the FOLFOX regimen beginning in April, 2009. Avasta was discontinued from the chemotherapy regimen beginning in November, 2009 due to nephrotic range proteinuria. A CT of the abdomen and pelvis 02/05/2011 revealed evidence of three small liver lesions. An MRI of the abdomen on 03/02/2011 confirmed 3 liver lesions and no additional evidence of metastatic disease. K-RAS testing on the transverse colon and left colon tumors revealed a wild type genotype.  1A. Staging PET scan 03/21/2011 confirmed 3 hypermetabolic liver lesions and no additional evidence of metastatic disease.  1B. Surgical consultation by Dr. Donell Beers was obtained and Mrs. Dirks decided against an attempt at surgical resection/radiofrequency ablation.  1C. Salvage therapy with FOLFOX was initiated 04/13/2011.  1D. A restaging CT  06/28/2011 revealed a significant improvement in the hepatic metastasis.  1E. She completed eight cycles of FOLFOX chemotherapy. Treatment was switched to 5FU-leucovorin as per the FOLFOX regimen beginning with cycle #9 due to an allergic reaction (skin rash) following cycle #8.  68F. Restaging CT 10/25/2011 revealed 2 more conspicuous liver metastases .  1G. Restaging PET scan 12/24/2011 revealed an increase in the size and metabolic activity of 3 liver metastases  1H. She began FOLFIRI chemotherapy on 01/19/2012.  1. Diabetes. 2. History of proteinuria secondary to Avastin versus diabetes- improved. 3. Mucositis and hand foot syndrome, secondary to 5FU improved with a dose reduction of the 5-FU. 4. Anemia secondary to chemotherapy. 5. Left back and left leg pain with an MRI of the lumbar spine confirming lumbar disk disease. She is status post surgery by Dr. Franky Macho 02/14/2008 with resolution of the back and left leg pain. 6. History of rosacea.  7. Depression, maintained on Lexapro. 8. History of bilateral knee pain secondary to arthritis -- she takes Vicodin as needed. 9. History of anorexia -- weight loss -- ?Related to depression/anxiety or metastatic colon cancer. 10. Allergic reaction to oxaliplatin with FOLFOX on 04/13/2011. She was able to tolerate FOLFOX on 04/20/2011 with steroid premedication and a prolonged oxaliplatin infusion. After cycle #8 she developed a rash upon returning home with pruritus. She took Benadryl with resolution of symptoms. This reaction occurred despite steroid premedication. 11. Poor dentition -- she has been evaluated by Dr. Dalene Carrow. She has elected to delay recommended tooth extractions 12. Neutropenia following cycle 1 of FOLFIRI. The FOLFIRI was dose reduced beginning with cycle 2. She again has neutropenia today and the chemotherapy will be held.  Disposition:  She appears stable. There is neutropenia today and we decided to delay the next cycle of  chemotherapy. She will return for cycle 4 of FOLFIRI on 03/21/2012. She is scheduled for an office visit and chemotherapy on 04/04/2012. The FOLFIRI will be further dose reduced or we will add Neulasta support if the neutrophil count is low on 04/04/2012.   Lucile Shutters, MD  03/14/2012  5:30 PM

## 2012-03-14 NOTE — Telephone Encounter (Signed)
Gave pt appt calendar for 03/21/12 chemo r/s per Dr. Truett Perna from 4/30. Pt will see ML on 04/04/12 with lab and chemo

## 2012-03-14 NOTE — Progress Notes (Signed)
Patient reports feeling weak and fatigued with little energy or motivation to "do anything". Feels her family expects too much of her. Want her to take husband to his MD appointments and do her own shopping. She admits she used to enjoy shopping and going out to lunch with people and going to church, all of which she does not do any longer. Suggested she focus on one thing she used to enjoy doing and make it a short term goal to accomplish and continue to do it. Suggested she may want to get in touch with Dr. Noe Gens again.

## 2012-03-21 ENCOUNTER — Inpatient Hospital Stay: Payer: Medicare Other

## 2012-03-21 ENCOUNTER — Ambulatory Visit (HOSPITAL_BASED_OUTPATIENT_CLINIC_OR_DEPARTMENT_OTHER): Payer: Medicare Other

## 2012-03-21 ENCOUNTER — Other Ambulatory Visit (HOSPITAL_BASED_OUTPATIENT_CLINIC_OR_DEPARTMENT_OTHER): Payer: Medicare Other | Admitting: Lab

## 2012-03-21 VITALS — BP 136/70 | HR 91 | Temp 97.4°F

## 2012-03-21 DIAGNOSIS — C189 Malignant neoplasm of colon, unspecified: Secondary | ICD-10-CM

## 2012-03-21 DIAGNOSIS — R197 Diarrhea, unspecified: Secondary | ICD-10-CM

## 2012-03-21 DIAGNOSIS — Z5111 Encounter for antineoplastic chemotherapy: Secondary | ICD-10-CM

## 2012-03-21 DIAGNOSIS — C787 Secondary malignant neoplasm of liver and intrahepatic bile duct: Secondary | ICD-10-CM

## 2012-03-21 LAB — CBC WITH DIFFERENTIAL/PLATELET
BASO%: 0.6 % (ref 0.0–2.0)
LYMPH%: 25.6 % (ref 14.0–49.7)
MCHC: 33.2 g/dL (ref 31.5–36.0)
MCV: 94.3 fL (ref 79.5–101.0)
MONO%: 7.1 % (ref 0.0–14.0)
NEUT%: 63 % (ref 38.4–76.8)
Platelets: 308 10*3/uL (ref 145–400)
RBC: 3.16 10*6/uL — ABNORMAL LOW (ref 3.70–5.45)
nRBC: 0 % (ref 0–0)

## 2012-03-21 LAB — COMPREHENSIVE METABOLIC PANEL
ALT: 11 U/L (ref 0–35)
BUN: 21 mg/dL (ref 6–23)
CO2: 21 mEq/L (ref 19–32)
Creatinine, Ser: 1.11 mg/dL — ABNORMAL HIGH (ref 0.50–1.10)
Total Bilirubin: 0.4 mg/dL (ref 0.3–1.2)

## 2012-03-21 MED ORDER — ONDANSETRON 16 MG/50ML IVPB (CHCC)
16.0000 mg | Freq: Once | INTRAVENOUS | Status: AC
  Administered 2012-03-21: 16 mg via INTRAVENOUS

## 2012-03-21 MED ORDER — FLUOROURACIL CHEMO INJECTION 500 MG/10ML
234.0000 mg/m2 | Freq: Once | INTRAVENOUS | Status: AC
  Administered 2012-03-21: 450 mg via INTRAVENOUS
  Filled 2012-03-21: qty 9

## 2012-03-21 MED ORDER — SODIUM CHLORIDE 0.9 % IV SOLN
1500.0000 mg/m2 | INTRAVENOUS | Status: DC
  Administered 2012-03-21: 3000 mg via INTRAVENOUS
  Filled 2012-03-21: qty 60

## 2012-03-21 MED ORDER — SODIUM CHLORIDE 0.9 % IV SOLN
Freq: Once | INTRAVENOUS | Status: AC
  Administered 2012-03-21: 11:00:00 via INTRAVENOUS

## 2012-03-21 MED ORDER — IRINOTECAN HCL CHEMO INJECTION 100 MG/5ML
140.0000 mg/m2 | Freq: Once | INTRAVENOUS | Status: AC
  Administered 2012-03-21: 282 mg via INTRAVENOUS
  Filled 2012-03-21: qty 14.1

## 2012-03-21 MED ORDER — LORAZEPAM 1 MG PO TABS
0.5000 mg | ORAL_TABLET | Freq: Once | ORAL | Status: AC
  Administered 2012-03-21: 0.5 mg via ORAL

## 2012-03-21 MED ORDER — DEXAMETHASONE SODIUM PHOSPHATE 4 MG/ML IJ SOLN
20.0000 mg | Freq: Once | INTRAMUSCULAR | Status: AC
  Administered 2012-03-21: 20 mg via INTRAVENOUS

## 2012-03-21 MED ORDER — LEUCOVORIN CALCIUM INJECTION 350 MG
285.0000 mg/m2 | Freq: Once | INTRAVENOUS | Status: AC
  Administered 2012-03-21: 572 mg via INTRAVENOUS
  Filled 2012-03-21: qty 28.6

## 2012-03-21 MED ORDER — ATROPINE SULFATE 0.4 MG/ML IJ SOLN
0.5000 mg | Freq: Once | INTRAMUSCULAR | Status: AC | PRN
  Administered 2012-03-21: 14:00:00 via INTRAVENOUS
  Filled 2012-03-21: qty 1.25

## 2012-03-21 NOTE — Patient Instructions (Signed)
[facility] Discharge Instructions for Patients Receiving Chemotherapy  Today you received the following chemotherapy agents; Leucovorin, Irinotecan and Adrucil.  To help prevent nausea and vomiting after your treatment, we encourage you to take your nausea medication as instructed by your physician. Begin taking it as prescribed.   If you develop nausea and vomiting that is not controlled by your nausea medication, call the clinic. If it is after clinic hours your family physician or the after hours number for the clinic or go to the Emergency Department.   BELOW ARE SYMPTOMS THAT SHOULD BE REPORTED IMMEDIATELY:  *FEVER GREATER THAN 100.5 F  *CHILLS WITH OR WITHOUT FEVER  NAUSEA AND VOMITING THAT IS NOT CONTROLLED WITH YOUR NAUSEA MEDICATION  *UNUSUAL SHORTNESS OF BREATH  *UNUSUAL BRUISING OR BLEEDING  TENDERNESS IN MOUTH AND THROAT WITH OR WITHOUT PRESENCE OF ULCERS  *URINARY PROBLEMS  *BOWEL PROBLEMS  UNUSUAL RASH Items with * indicate a potential emergency and should be followed up as soon as possible.  One of the nurses will contact you 24 hours after your treatment. Please let the nurse know about any problems that you may have experienced. Feel free to call the clinic you have any questions or concerns. The clinic phone number is [phone].   I have been informed and understand all the instructions given to me. I know to contact the clinic, my physician, or go to the Emergency Department if any problems should occur. I do not have any questions at this time, but understand that I may call the clinic during office hours   should I have any questions or need assistance in obtaining follow up care.    __________________________________________  _____________  __________ Signature of Patient or Authorized Representative            Date                   Time    __________________________________________ Nurse's Signature

## 2012-03-21 NOTE — Progress Notes (Signed)
1320 Patient c/o nausea and diarrhea. Dr. Truett Perna notified and orders received for 0.5 mg of ativan.   1335 Patient c/o chills. V/s  Temp-97.2, B/P 133/75, HR 86, O2 sat @ 98% on room air.  Irinototecan placed on hold.  Lonna Cobb PA assessed.   1337 given ok by Lonna Cobb to administer atropine for patient c/o diarrhea and to hold Irinotecan.  1344 Patient returned from bathroom. V/S: temp 96.9, B/P 162/77, HR 100, O2 Sat 99% on room air.  1402 V/S temp 97.02, B/P 136/70, HR 91. Patient states feeling better.  1410 Restarted Irinotecan.  1500 Patient Discharged with daughter. Patient stated feeling better, with no other complaints. Educated patient on use and future need of atropine to prevent diarrhea.

## 2012-03-23 ENCOUNTER — Ambulatory Visit (HOSPITAL_BASED_OUTPATIENT_CLINIC_OR_DEPARTMENT_OTHER): Payer: Medicare Other

## 2012-03-23 DIAGNOSIS — Z452 Encounter for adjustment and management of vascular access device: Secondary | ICD-10-CM

## 2012-03-23 DIAGNOSIS — C189 Malignant neoplasm of colon, unspecified: Secondary | ICD-10-CM

## 2012-03-23 MED ORDER — SODIUM CHLORIDE 0.9 % IJ SOLN
10.0000 mL | INTRAMUSCULAR | Status: DC | PRN
  Administered 2012-03-23: 10 mL via INTRAVENOUS
  Filled 2012-03-23: qty 10

## 2012-03-23 MED ORDER — HEPARIN SOD (PORK) LOCK FLUSH 100 UNIT/ML IV SOLN
500.0000 [IU] | Freq: Once | INTRAVENOUS | Status: AC
  Administered 2012-03-23: 500 [IU] via INTRAVENOUS
  Filled 2012-03-23: qty 5

## 2012-03-28 ENCOUNTER — Other Ambulatory Visit: Payer: Medicare Other | Admitting: Lab

## 2012-03-28 ENCOUNTER — Ambulatory Visit: Payer: Medicare Other | Admitting: Nurse Practitioner

## 2012-03-28 ENCOUNTER — Inpatient Hospital Stay: Payer: Medicare Other

## 2012-04-02 ENCOUNTER — Other Ambulatory Visit: Payer: Self-pay | Admitting: Oncology

## 2012-04-04 ENCOUNTER — Telehealth: Payer: Self-pay | Admitting: Oncology

## 2012-04-04 ENCOUNTER — Telehealth: Payer: Self-pay | Admitting: *Deleted

## 2012-04-04 ENCOUNTER — Ambulatory Visit (HOSPITAL_BASED_OUTPATIENT_CLINIC_OR_DEPARTMENT_OTHER): Payer: Medicare Other | Admitting: Nurse Practitioner

## 2012-04-04 ENCOUNTER — Other Ambulatory Visit (HOSPITAL_BASED_OUTPATIENT_CLINIC_OR_DEPARTMENT_OTHER): Payer: Medicare Other | Admitting: Lab

## 2012-04-04 ENCOUNTER — Ambulatory Visit (HOSPITAL_BASED_OUTPATIENT_CLINIC_OR_DEPARTMENT_OTHER): Payer: Medicare Other

## 2012-04-04 VITALS — BP 115/58 | HR 76 | Temp 97.2°F | Ht 61.0 in | Wt 212.9 lb

## 2012-04-04 DIAGNOSIS — Z5111 Encounter for antineoplastic chemotherapy: Secondary | ICD-10-CM

## 2012-04-04 DIAGNOSIS — C189 Malignant neoplasm of colon, unspecified: Secondary | ICD-10-CM

## 2012-04-04 DIAGNOSIS — C787 Secondary malignant neoplasm of liver and intrahepatic bile duct: Secondary | ICD-10-CM

## 2012-04-04 LAB — CBC WITH DIFFERENTIAL/PLATELET
BASO%: 0.6 % (ref 0.0–2.0)
HCT: 27 % — ABNORMAL LOW (ref 34.8–46.6)
LYMPH%: 44.2 % (ref 14.0–49.7)
MCH: 31.2 pg (ref 25.1–34.0)
MCHC: 33 g/dL (ref 31.5–36.0)
MCV: 94.7 fL (ref 79.5–101.0)
MONO#: 0.3 10*3/uL (ref 0.1–0.9)
MONO%: 9.4 % (ref 0.0–14.0)
NEUT%: 38.7 % (ref 38.4–76.8)
Platelets: 243 10*3/uL (ref 145–400)
RBC: 2.85 10*6/uL — ABNORMAL LOW (ref 3.70–5.45)
WBC: 3.1 10*3/uL — ABNORMAL LOW (ref 3.9–10.3)

## 2012-04-04 LAB — COMPREHENSIVE METABOLIC PANEL
ALT: 10 U/L (ref 0–35)
Alkaline Phosphatase: 89 U/L (ref 39–117)
CO2: 19 mEq/L (ref 19–32)
Creatinine, Ser: 1.11 mg/dL — ABNORMAL HIGH (ref 0.50–1.10)
Total Bilirubin: 0.4 mg/dL (ref 0.3–1.2)

## 2012-04-04 MED ORDER — FLUOROURACIL CHEMO INJECTION 500 MG/10ML
234.0000 mg/m2 | Freq: Once | INTRAVENOUS | Status: AC
  Administered 2012-04-04: 450 mg via INTRAVENOUS
  Filled 2012-04-04: qty 9

## 2012-04-04 MED ORDER — LEUCOVORIN CALCIUM INJECTION 350 MG
285.0000 mg/m2 | Freq: Once | INTRAVENOUS | Status: AC
  Administered 2012-04-04: 572 mg via INTRAVENOUS
  Filled 2012-04-04: qty 28.6

## 2012-04-04 MED ORDER — ATROPINE SULFATE 0.4 MG/ML IJ SOLN
0.5000 mg | Freq: Once | INTRAMUSCULAR | Status: AC | PRN
  Administered 2012-04-04: 0.5 mg via INTRAVENOUS
  Filled 2012-04-04: qty 1.25

## 2012-04-04 MED ORDER — SODIUM CHLORIDE 0.9 % IV SOLN
1500.0000 mg/m2 | INTRAVENOUS | Status: DC
  Administered 2012-04-04: 3000 mg via INTRAVENOUS
  Filled 2012-04-04: qty 60

## 2012-04-04 MED ORDER — PEGFILGRASTIM INJECTION 6 MG/0.6ML
6.0000 mg | Freq: Once | SUBCUTANEOUS | Status: DC
  Filled 2012-04-04: qty 0.6

## 2012-04-04 MED ORDER — SODIUM CHLORIDE 0.9 % IV SOLN
Freq: Once | INTRAVENOUS | Status: AC
  Administered 2012-04-04: 12:00:00 via INTRAVENOUS

## 2012-04-04 MED ORDER — PEGFILGRASTIM INJECTION 6 MG/0.6ML: 6.0000 mg | Freq: Once | SUBCUTANEOUS | Status: DC

## 2012-04-04 MED ORDER — DEXAMETHASONE SODIUM PHOSPHATE 4 MG/ML IJ SOLN
20.0000 mg | Freq: Once | INTRAMUSCULAR | Status: AC
  Administered 2012-04-04: 20 mg via INTRAVENOUS

## 2012-04-04 MED ORDER — ONDANSETRON 16 MG/50ML IVPB (CHCC)
16.0000 mg | Freq: Once | INTRAVENOUS | Status: AC
  Administered 2012-04-04: 16 mg via INTRAVENOUS

## 2012-04-04 MED ORDER — IRINOTECAN HCL CHEMO INJECTION 100 MG/5ML
140.0000 mg/m2 | Freq: Once | INTRAVENOUS | Status: AC
  Administered 2012-04-04: 282 mg via INTRAVENOUS
  Filled 2012-04-04: qty 14.1

## 2012-04-04 NOTE — Telephone Encounter (Signed)
Gv pt appt for may-june2013.  scheduled ct scan on 06/06 @ [facility]

## 2012-04-04 NOTE — Progress Notes (Signed)
OFFICE PROGRESS NOTE  Interval history:  Emily Lawson returns as scheduled. She completed cycle 4 FOLFIRI on 03/21/2012. She developed nausea and diarrhea during the irinotecan infusion. Symptoms resolved with atropine. She denies mouth sores. She notes dry peeling skin on her hands. Main complaint continues to be fatigue.   Objective: Blood pressure 115/58, pulse 76, temperature 97.2 F (36.2 C), temperature source Oral, height 5\' 1"  (1.549 m), weight 212 lb 14.4 oz (96.571 kg).  Oropharynx is without thrush or ulceration. Lungs are clear. Regular cardiac rhythm. Port-A-Cath site is without erythema. Abdomen is soft and nontender. No hepatomegaly. Extremities are without edema. Palms with scattered areas of hyperpigmentation and dry peeling skin.  Lab Results: Lab Results  Component Value Date   WBC 3.1* 04/04/2012   HGB 8.9* 04/04/2012   HCT 27.0* 04/04/2012   MCV 94.7 04/04/2012   PLT 243 04/04/2012    Chemistry:    Chemistry      Component Value Date/Time   NA 135 03/21/2012 0946   K 4.7 03/21/2012 0946   CL 99 03/21/2012 0946   CO2 21 03/21/2012 0946   BUN 21 03/21/2012 0946   CREATININE 1.11* 03/21/2012 0946   CREATININE 1.29* 05/20/2009 1423   GLU 102* 10/19/2007 1433      Component Value Date/Time   CALCIUM 9.2 03/21/2012 0946   ALKPHOS 96 03/21/2012 0946   AST 18 03/21/2012 0946   ALT 11 03/21/2012 0946   BILITOT 0.4 03/21/2012 0946       Studies/Results: No results found.  Medications: I have reviewed the patient's current medications.  Assessment/Plan:  1. Metastatic colon cancer presenting with synchronous colon primaries in September, 2008, status post a hemicolectomy and liver biopsy 08/03/2007. A CT on 04/20/2010 revealed no evidence metastatic disease. She was maintained on 5-FU/leucovorin per the FOLFOX regimen beginning in April, 2009. Avasta was discontinued from the chemotherapy regimen beginning in November, 2009 due to nephrotic range proteinuria. A CT of the abdomen  and pelvis 02/05/2011 revealed evidence of three small liver lesions. An MRI of the abdomen on 03/02/2011 confirmed 3 liver lesions and no additional evidence of metastatic disease. K-RAS testing on the transverse colon and left colon tumors revealed a wild type genotype.  1A. Staging PET scan 03/21/2011 confirmed 3 hypermetabolic liver lesions and no additional evidence of metastatic disease.  1B. Surgical consultation by Dr. Donell Lawson was obtained and Emily Lawson decided against an attempt at surgical resection/radiofrequency ablation.  1C. Salvage therapy with FOLFOX was initiated 04/13/2011.  1D. A restaging CT 06/28/2011 revealed a significant improvement in the hepatic metastasis.  1E. She completed eight cycles of FOLFOX chemotherapy. Treatment was switched to 5FU-leucovorin as per the FOLFOX regimen beginning with cycle #9 due to an allergic reaction (skin rash) following cycle #8.  11F. Restaging CT 10/25/2011 revealed 2 more conspicuous liver metastases .  1G. Restaging PET scan 12/24/2011 revealed an increase in the size and metabolic activity of 3 liver metastases  1H. She began FOLFIRI chemotherapy on 01/19/2012. She has completed 4 cycles. 1. Diabetes. 2. History of proteinuria secondary to Avastin versus diabetes- improved. 3. Mucositis and hand foot syndrome, secondary to 5FU improved with a dose reduction of the 5-FU. 4. Anemia secondary to chemotherapy. 5. Left back and left leg pain with an MRI of the lumbar spine confirming lumbar disk disease. She is status post surgery by Dr. Franky Lawson 02/14/2008 with resolution of the back and left leg pain. 6. History of rosacea.  7. Depression, maintained on Lexapro.  8. History of bilateral knee pain secondary to arthritis -- she takes Vicodin as needed. 9. History of anorexia -- weight loss -- ?Related to depression/anxiety or metastatic colon cancer. 10. Allergic reaction to oxaliplatin with FOLFOX on 04/13/2011. She was able to tolerate FOLFOX  on 04/20/2011 with steroid premedication and a prolonged oxaliplatin infusion. After cycle #8 she developed a rash upon returning home with pruritus. She took Benadryl with resolution of symptoms. This reaction occurred despite steroid premedication. 11. Poor dentition -- she has been evaluated by Dr. Dalene Lawson. She has elected to delay recommended tooth extractions 12. Neutropenia following cycle 1 of FOLFIRI. The FOLFIRI was dose reduced beginning with cycle 2. She was neutropenic 03/14/2012 and chemotherapy was held for one week. She has mild neutropenia on labs today. Neulasta will be added on the day of pump discontinuation. 13. Mild hand-foot syndrome secondary to 5-fluorouracil. 14. Nausea and diarrhea during the irinotecan infusion on 03/21/2012. Symptoms resolved with atropine. She will now receive atropine as a premedication with each cycle.  Disposition-Emily Lawson appears stable. Plan to proceed with cycle 5 FOLFIRI today as scheduled. She will receive Neulasta on day of pump discontinuation. Potential toxicities associated with Neulasta were reviewed including arthralgias, rash, splenic rupture. She will return for a followup visit and cycle 6 in 2 weeks. She will contact the office in the interim with any problems.  Plan reviewed with Dr. Truett Lawson.  Emily Lawson ANP/GNP-BC

## 2012-04-04 NOTE — Telephone Encounter (Signed)
Per staff message from [place], I have scheduled the treatment appts for patient. Appts in computer and [place] aware.  JMW

## 2012-04-06 ENCOUNTER — Ambulatory Visit (HOSPITAL_BASED_OUTPATIENT_CLINIC_OR_DEPARTMENT_OTHER): Payer: Medicare Other

## 2012-04-06 VITALS — BP 134/73 | HR 79 | Temp 97.0°F

## 2012-04-06 DIAGNOSIS — C189 Malignant neoplasm of colon, unspecified: Secondary | ICD-10-CM

## 2012-04-06 DIAGNOSIS — C787 Secondary malignant neoplasm of liver and intrahepatic bile duct: Secondary | ICD-10-CM

## 2012-04-06 MED ORDER — SODIUM CHLORIDE 0.9 % IJ SOLN
10.0000 mL | INTRAMUSCULAR | Status: DC | PRN
  Administered 2012-04-06: 10 mL
  Filled 2012-04-06: qty 10

## 2012-04-06 MED ORDER — HEPARIN SOD (PORK) LOCK FLUSH 100 UNIT/ML IV SOLN
500.0000 [IU] | Freq: Once | INTRAVENOUS | Status: AC | PRN
  Administered 2012-04-06: 500 [IU]
  Filled 2012-04-06: qty 5

## 2012-04-06 MED ORDER — PEGFILGRASTIM INJECTION 6 MG/0.6ML
6.0000 mg | Freq: Once | SUBCUTANEOUS | Status: AC
  Administered 2012-04-06: 6 mg via SUBCUTANEOUS
  Filled 2012-04-06: qty 0.6

## 2012-04-06 NOTE — Patient Instructions (Signed)
Call MD with any questions 

## 2012-04-17 ENCOUNTER — Other Ambulatory Visit: Payer: Self-pay | Admitting: Oncology

## 2012-04-18 ENCOUNTER — Ambulatory Visit (HOSPITAL_BASED_OUTPATIENT_CLINIC_OR_DEPARTMENT_OTHER): Payer: Medicare Other | Admitting: Nurse Practitioner

## 2012-04-18 ENCOUNTER — Other Ambulatory Visit (HOSPITAL_BASED_OUTPATIENT_CLINIC_OR_DEPARTMENT_OTHER): Payer: Medicare Other | Admitting: Lab

## 2012-04-18 ENCOUNTER — Ambulatory Visit (HOSPITAL_BASED_OUTPATIENT_CLINIC_OR_DEPARTMENT_OTHER): Payer: Medicare Other

## 2012-04-18 VITALS — BP 154/75 | HR 98 | Temp 96.9°F | Ht 61.0 in | Wt 209.6 lb

## 2012-04-18 DIAGNOSIS — Z5111 Encounter for antineoplastic chemotherapy: Secondary | ICD-10-CM

## 2012-04-18 DIAGNOSIS — C189 Malignant neoplasm of colon, unspecified: Secondary | ICD-10-CM

## 2012-04-18 DIAGNOSIS — C787 Secondary malignant neoplasm of liver and intrahepatic bile duct: Secondary | ICD-10-CM

## 2012-04-18 LAB — CBC WITH DIFFERENTIAL/PLATELET
Basophils Absolute: 0.1 10*3/uL (ref 0.0–0.1)
HCT: 27.3 % — ABNORMAL LOW (ref 34.8–46.6)
HGB: 8.9 g/dL — ABNORMAL LOW (ref 11.6–15.9)
MONO#: 0.9 10*3/uL (ref 0.1–0.9)
NEUT%: 79.4 % — ABNORMAL HIGH (ref 38.4–76.8)
Platelets: 186 10*3/uL (ref 145–400)
WBC: 15 10*3/uL — ABNORMAL HIGH (ref 3.9–10.3)
lymph#: 2 10*3/uL (ref 0.9–3.3)

## 2012-04-18 LAB — COMPREHENSIVE METABOLIC PANEL
Albumin: 3.5 g/dL (ref 3.5–5.2)
BUN: 17 mg/dL (ref 6–23)
CO2: 22 mEq/L (ref 19–32)
Calcium: 9.7 mg/dL (ref 8.4–10.5)
Chloride: 102 mEq/L (ref 96–112)
Glucose, Bld: 175 mg/dL — ABNORMAL HIGH (ref 70–99)
Potassium: 4.6 mEq/L (ref 3.5–5.3)

## 2012-04-18 MED ORDER — FLUOROURACIL CHEMO INJECTION 500 MG/10ML
234.0000 mg/m2 | Freq: Once | INTRAVENOUS | Status: AC
  Administered 2012-04-18: 450 mg via INTRAVENOUS
  Filled 2012-04-18: qty 9

## 2012-04-18 MED ORDER — SODIUM CHLORIDE 0.9 % IV SOLN
Freq: Once | INTRAVENOUS | Status: AC
  Administered 2012-04-18: 14:00:00 via INTRAVENOUS

## 2012-04-18 MED ORDER — ATROPINE SULFATE 0.4 MG/ML IJ SOLN
0.5000 mg | Freq: Once | INTRAMUSCULAR | Status: AC | PRN
  Administered 2012-04-18: 14:00:00 via INTRAVENOUS
  Filled 2012-04-18: qty 1.25

## 2012-04-18 MED ORDER — DEXAMETHASONE SODIUM PHOSPHATE 4 MG/ML IJ SOLN
20.0000 mg | Freq: Once | INTRAMUSCULAR | Status: AC
  Administered 2012-04-18: 20 mg via INTRAVENOUS

## 2012-04-18 MED ORDER — SODIUM CHLORIDE 0.9 % IJ SOLN
10.0000 mL | INTRAMUSCULAR | Status: DC | PRN
  Filled 2012-04-18: qty 10

## 2012-04-18 MED ORDER — LEUCOVORIN CALCIUM INJECTION 350 MG
285.0000 mg/m2 | Freq: Once | INTRAVENOUS | Status: AC
  Administered 2012-04-18: 572 mg via INTRAVENOUS
  Filled 2012-04-18: qty 28.6

## 2012-04-18 MED ORDER — ONDANSETRON 16 MG/50ML IVPB (CHCC)
16.0000 mg | Freq: Once | INTRAVENOUS | Status: AC
  Administered 2012-04-18: 16 mg via INTRAVENOUS

## 2012-04-18 MED ORDER — FLUOROURACIL CHEMO INJECTION 5 GM/100ML
1500.0000 mg/m2 | INTRAVENOUS | Status: DC
  Administered 2012-04-18: 3000 mg via INTRAVENOUS
  Filled 2012-04-18: qty 60

## 2012-04-18 MED ORDER — IRINOTECAN HCL CHEMO INJECTION 100 MG/5ML
140.0000 mg/m2 | Freq: Once | INTRAVENOUS | Status: AC
  Administered 2012-04-18: 282 mg via INTRAVENOUS
  Filled 2012-04-18: qty 14.1

## 2012-04-18 NOTE — Progress Notes (Signed)
OFFICE PROGRESS NOTE  Interval history:  Emily Lawson returns as scheduled. She completed cycle 5 FOLFIRI on 04/04/2012. She denies nausea/vomiting. She has occasional loose stools. She continues to be fatigued. She denies mouth sores. She woke up this morning with low back pain. She reports she was on her feet cooking most of the day yesterday. She denies leg weakness or numbness. No bowel or bladder dysfunction.   Objective: Blood pressure 154/75, pulse 98, temperature 96.9 F (36.1 C), temperature source Oral, height 5\' 1"  (1.549 m), weight 209 lb 9.6 oz (95.074 kg).  Oropharynx is without thrush or ulceration. Erythematous macular rash over both malar regions. Lungs clear. Regular cardiac rhythm. Port-A-Cath site is without erythema. Abdomen is soft and nontender. No hepatomegaly. Extremities are without edema. Motor strength is 5 over 5.  Lab Results: Lab Results  Component Value Date   WBC 15.0* 04/18/2012   HGB 8.9* 04/18/2012   HCT 27.3* 04/18/2012   MCV 96.1 04/18/2012   PLT 186 04/18/2012    Chemistry:    Chemistry      Component Value Date/Time   NA 138 04/04/2012 1010   K 4.3 04/04/2012 1010   CL 103 04/04/2012 1010   CO2 19 04/04/2012 1010   BUN 22 04/04/2012 1010   CREATININE 1.11* 04/04/2012 1010   CREATININE 1.29* 05/20/2009 1423   GLU 102* 10/19/2007 1433      Component Value Date/Time   CALCIUM 8.3* 04/04/2012 1010   ALKPHOS 89 04/04/2012 1010   AST 14 04/04/2012 1010   ALT 10 04/04/2012 1010   BILITOT 0.4 04/04/2012 1010       Studies/Results: No results found.  Medications: I have reviewed the patient's current medications.  Assessment/Plan:  1. Metastatic colon cancer presenting with synchronous colon primaries in September, 2008, status post a hemicolectomy and liver biopsy 08/03/2007. A CT on 04/20/2010 revealed no evidence metastatic disease. She was maintained on 5-FU/leucovorin per the FOLFOX regimen beginning in April, 2009. Avasta was discontinued from the  chemotherapy regimen beginning in November, 2009 due to nephrotic range proteinuria. A CT of the abdomen and pelvis 02/05/2011 revealed evidence of three small liver lesions. An MRI of the abdomen on 03/02/2011 confirmed 3 liver lesions and no additional evidence of metastatic disease. K-RAS testing on the transverse colon and left colon tumors revealed a wild type genotype.  1A. Staging PET scan 03/21/2011 confirmed 3 hypermetabolic liver lesions and no additional evidence of metastatic disease.  1B. Surgical consultation by Dr. Donell Beers was obtained and Emily Lawson decided against an attempt at surgical resection/radiofrequency ablation.  1C. Salvage therapy with FOLFOX was initiated 04/13/2011.  1D. A restaging CT 06/28/2011 revealed a significant improvement in the hepatic metastasis.  1E. She completed eight cycles of FOLFOX chemotherapy. Treatment was switched to 5FU-leucovorin as per the FOLFOX regimen beginning with cycle #9 due to an allergic reaction (skin rash) following cycle #8.  59F. Restaging CT 10/25/2011 revealed 2 more conspicuous liver metastases .  1G. Restaging PET scan 12/24/2011 revealed an increase in the size and metabolic activity of 3 liver metastases  1H. She began FOLFIRI chemotherapy on 01/19/2012. She has completed 5 cycles.  1. Diabetes. 2. History of proteinuria secondary to Avastin versus diabetes- improved. 3. Mucositis and hand foot syndrome, secondary to 5FU improved with a dose reduction of the 5-FU. 4. Anemia secondary to chemotherapy. 5. Left back and left leg pain with an MRI of the lumbar spine confirming lumbar disk disease. She is status post surgery by  Dr. Franky Macho 02/14/2008 with resolution of the back and left leg pain. 6. History of rosacea.  7. Depression, maintained on Lexapro. 8. History of bilateral knee pain secondary to arthritis -- she takes Vicodin as needed. 9. History of anorexia -- weight loss -- ?Related to depression/anxiety or metastatic colon  cancer. 10. Allergic reaction to oxaliplatin with FOLFOX on 04/13/2011. She was able to tolerate FOLFOX on 04/20/2011 with steroid premedication and a prolonged oxaliplatin infusion. After cycle #8 she developed a rash upon returning home with pruritus. She took Benadryl with resolution of symptoms. This reaction occurred despite steroid premedication. 11. Poor dentition -- she has been evaluated by Dr. Dalene Carrow. She has elected to delay recommended tooth extractions 12. Neutropenia following cycle 1 of FOLFIRI. The FOLFIRI was dose reduced beginning with cycle 2. She was neutropenic 03/14/2012 and chemotherapy was held for one week. She received Neulasta with cycle 5 on the day of pump discontinuation. 13. Mild hand-foot syndrome secondary to 5-fluorouracil. 14. Nausea and diarrhea during the irinotecan infusion on 03/21/2012. Symptoms resolved with atropine. She receives atropine as a premedication with each cycle. 15. Recent low back pain. The low back pain occurred following increased activity. She will contact the office if the pain worsens or she develops any other symptoms.  Disposition-Emily Lawson appears stable. Plan to proceed with cycle 6 FOLFIRI today as scheduled. She will not receive Neulasta with this cycle. She is scheduled for restaging CT scans 05/08/2012. She will return for a followup visit on 05/09/2012. She will contact the office in the interim as outlined above or with any other problems.  Plan reviewed with Dr. Truett Perna.  Lonna Cobb ANP/GNP-BC

## 2012-04-20 ENCOUNTER — Ambulatory Visit (HOSPITAL_BASED_OUTPATIENT_CLINIC_OR_DEPARTMENT_OTHER): Payer: Medicare Other

## 2012-04-20 VITALS — BP 123/67 | HR 82 | Temp 97.7°F

## 2012-04-20 DIAGNOSIS — C787 Secondary malignant neoplasm of liver and intrahepatic bile duct: Secondary | ICD-10-CM

## 2012-04-20 DIAGNOSIS — C189 Malignant neoplasm of colon, unspecified: Secondary | ICD-10-CM

## 2012-04-20 DIAGNOSIS — Z452 Encounter for adjustment and management of vascular access device: Secondary | ICD-10-CM

## 2012-04-20 MED ORDER — HEPARIN SOD (PORK) LOCK FLUSH 100 UNIT/ML IV SOLN
500.0000 [IU] | Freq: Once | INTRAVENOUS | Status: AC | PRN
  Administered 2012-04-20: 500 [IU]
  Filled 2012-04-20: qty 5

## 2012-04-20 MED ORDER — SODIUM CHLORIDE 0.9 % IJ SOLN
10.0000 mL | INTRAMUSCULAR | Status: DC | PRN
  Administered 2012-04-20: 10 mL
  Filled 2012-04-20: qty 10

## 2012-04-20 NOTE — Patient Instructions (Signed)
Please Call the MD if you have any Problems.

## 2012-05-02 ENCOUNTER — Telehealth: Payer: Self-pay | Admitting: *Deleted

## 2012-05-02 NOTE — Telephone Encounter (Signed)
Call from pt asking if OK to have manicure/ pedicure. Yes. Blood counts are adequate.

## 2012-05-04 ENCOUNTER — Other Ambulatory Visit (HOSPITAL_COMMUNITY): Payer: Medicare Other

## 2012-05-07 ENCOUNTER — Other Ambulatory Visit: Payer: Self-pay | Admitting: Oncology

## 2012-05-08 ENCOUNTER — Ambulatory Visit (HOSPITAL_COMMUNITY)
Admission: RE | Admit: 2012-05-08 | Discharge: 2012-05-08 | Disposition: A | Discharge status: Home / Self Care | Payer: Medicare Other | Source: Ambulatory Visit | Attending: Nurse Practitioner | Admitting: Nurse Practitioner

## 2012-05-08 DIAGNOSIS — I2510 Atherosclerotic heart disease of native coronary artery without angina pectoris: Secondary | ICD-10-CM | POA: Insufficient documentation

## 2012-05-08 DIAGNOSIS — Z9089 Acquired absence of other organs: Secondary | ICD-10-CM | POA: Insufficient documentation

## 2012-05-08 DIAGNOSIS — C189 Malignant neoplasm of colon, unspecified: Secondary | ICD-10-CM

## 2012-05-08 DIAGNOSIS — K7689 Other specified diseases of liver: Secondary | ICD-10-CM | POA: Insufficient documentation

## 2012-05-08 DIAGNOSIS — I359 Nonrheumatic aortic valve disorder, unspecified: Secondary | ICD-10-CM | POA: Insufficient documentation

## 2012-05-08 MED ORDER — IOHEXOL 300 MG/ML  SOLN
100.0000 mL | Freq: Once | INTRAMUSCULAR | Status: AC | PRN
  Administered 2012-05-08: 100 mL via INTRAVENOUS

## 2012-05-09 ENCOUNTER — Ambulatory Visit (HOSPITAL_BASED_OUTPATIENT_CLINIC_OR_DEPARTMENT_OTHER): Payer: Medicare Other | Admitting: Oncology

## 2012-05-09 ENCOUNTER — Telehealth: Payer: Self-pay | Admitting: Oncology

## 2012-05-09 ENCOUNTER — Other Ambulatory Visit (HOSPITAL_BASED_OUTPATIENT_CLINIC_OR_DEPARTMENT_OTHER): Payer: Medicare Other | Admitting: Lab

## 2012-05-09 ENCOUNTER — Ambulatory Visit (HOSPITAL_BASED_OUTPATIENT_CLINIC_OR_DEPARTMENT_OTHER): Payer: Medicare Other

## 2012-05-09 VITALS — BP 146/76 | HR 74 | Temp 97.1°F | Ht 61.0 in | Wt 212.5 lb

## 2012-05-09 VITALS — BP 134/67

## 2012-05-09 DIAGNOSIS — C787 Secondary malignant neoplasm of liver and intrahepatic bile duct: Secondary | ICD-10-CM

## 2012-05-09 DIAGNOSIS — C189 Malignant neoplasm of colon, unspecified: Secondary | ICD-10-CM

## 2012-05-09 DIAGNOSIS — Z5111 Encounter for antineoplastic chemotherapy: Secondary | ICD-10-CM

## 2012-05-09 DIAGNOSIS — D649 Anemia, unspecified: Secondary | ICD-10-CM

## 2012-05-09 LAB — CBC WITH DIFFERENTIAL/PLATELET
EOS%: 3.7 % (ref 0.0–7.0)
Eosinophils Absolute: 0.2 10*3/uL (ref 0.0–0.5)
LYMPH%: 31.7 % (ref 14.0–49.7)
MCH: 31.9 pg (ref 25.1–34.0)
MCHC: 32.9 g/dL (ref 31.5–36.0)
MCV: 97.2 fL (ref 79.5–101.0)
MONO%: 7.5 % (ref 0.0–14.0)
Platelets: 337 10*3/uL (ref 145–400)
RBC: 2.88 10*6/uL — ABNORMAL LOW (ref 3.70–5.45)
RDW: 16.2 % — ABNORMAL HIGH (ref 11.2–14.5)

## 2012-05-09 LAB — UA PROTEIN, DIPSTICK - CHCC: Protein, ur: NEGATIVE mg/dL

## 2012-05-09 LAB — COMPREHENSIVE METABOLIC PANEL
AST: 19 U/L (ref 0–37)
Alkaline Phosphatase: 102 U/L (ref 39–117)
Glucose, Bld: 177 mg/dL — ABNORMAL HIGH (ref 70–99)
Potassium: 4.1 mEq/L (ref 3.5–5.3)
Sodium: 137 mEq/L (ref 135–145)
Total Bilirubin: 0.3 mg/dL (ref 0.3–1.2)
Total Protein: 6.7 g/dL (ref 6.0–8.3)

## 2012-05-09 MED ORDER — ONDANSETRON 16 MG/50ML IVPB (CHCC)
16.0000 mg | Freq: Once | INTRAVENOUS | Status: AC
  Administered 2012-05-09: 16 mg via INTRAVENOUS

## 2012-05-09 MED ORDER — ATROPINE SULFATE 0.4 MG/ML IJ SOLN
0.5000 mg | Freq: Once | INTRAMUSCULAR | Status: AC | PRN
  Administered 2012-05-09: 0.5 mg via INTRAVENOUS
  Filled 2012-05-09: qty 1.25

## 2012-05-09 MED ORDER — SODIUM CHLORIDE 0.9 % IV SOLN
Freq: Once | INTRAVENOUS | Status: AC
  Administered 2012-05-09: 12:00:00 via INTRAVENOUS

## 2012-05-09 MED ORDER — FLUOROURACIL CHEMO INJECTION 500 MG/10ML
234.0000 mg/m2 | Freq: Once | INTRAVENOUS | Status: AC
  Administered 2012-05-09: 450 mg via INTRAVENOUS
  Filled 2012-05-09: qty 9

## 2012-05-09 MED ORDER — SODIUM CHLORIDE 0.9 % IV SOLN
1500.0000 mg/m2 | INTRAVENOUS | Status: DC
  Administered 2012-05-09: 3000 mg via INTRAVENOUS
  Filled 2012-05-09: qty 60

## 2012-05-09 MED ORDER — IRINOTECAN HCL CHEMO INJECTION 100 MG/5ML
140.0000 mg/m2 | Freq: Once | INTRAVENOUS | Status: AC
  Administered 2012-05-09: 282 mg via INTRAVENOUS
  Filled 2012-05-09: qty 14.1

## 2012-05-09 MED ORDER — SODIUM CHLORIDE 0.9 % IV SOLN
5.0000 mg/kg | Freq: Once | INTRAVENOUS | Status: AC
  Administered 2012-05-09: 475 mg via INTRAVENOUS
  Filled 2012-05-09: qty 19

## 2012-05-09 MED ORDER — LEUCOVORIN CALCIUM INJECTION 350 MG
285.0000 mg/m2 | Freq: Once | INTRAMUSCULAR | Status: AC
  Administered 2012-05-09: 572 mg via INTRAVENOUS
  Filled 2012-05-09: qty 28.6

## 2012-05-09 MED ORDER — DEXAMETHASONE SODIUM PHOSPHATE 4 MG/ML IJ SOLN
20.0000 mg | Freq: Once | INTRAMUSCULAR | Status: AC
  Administered 2012-05-09: 20 mg via INTRAVENOUS

## 2012-05-09 NOTE — Patient Instructions (Addendum)
[facility] Discharge Instructions for Patients Receiving Chemotherapy  Today you received the following chemotherapy agents Avastin, Leucovorin, Camptosar, and 5FU.   To help prevent nausea and vomiting after your treatment, we encourage you to take your nausea medication.   If you develop nausea and vomiting that is not controlled by your nausea medication, call the clinic. If it is after clinic hours your family physician or the after hours number for the clinic or go to the Emergency Department.   BELOW ARE SYMPTOMS THAT SHOULD BE REPORTED IMMEDIATELY:  *FEVER GREATER THAN 100.5 F  *CHILLS WITH OR WITHOUT FEVER  NAUSEA AND VOMITING THAT IS NOT CONTROLLED WITH YOUR NAUSEA MEDICATION  *UNUSUAL SHORTNESS OF BREATH  *UNUSUAL BRUISING OR BLEEDING  TENDERNESS IN MOUTH AND THROAT WITH OR WITHOUT PRESENCE OF ULCERS  *URINARY PROBLEMS  *BOWEL PROBLEMS  UNUSUAL RASH Items with * indicate a potential emergency and should be followed up as soon as possible.  One of the nurses will contact you 24 hours after your treatment. Please let the nurse know about any problems that you may have experienced. Feel free to call the clinic you have any questions or concerns. The clinic phone number is [phone].   I have been informed and understand all the instructions given to me. I know to contact the clinic, my physician, or go to the Emergency Department if any problems should occur. I do not have any questions at this time, but understand that I may call the clinic during office hours   should I have any questions or need assistance in obtaining follow up care.    __________________________________________  _____________  __________ Signature of Patient or Authorized Representative            Date                   Time    __________________________________________ Nurse's Signature

## 2012-05-09 NOTE — Telephone Encounter (Signed)
appts made and printed for pt aom °

## 2012-05-09 NOTE — Progress Notes (Signed)
[facility]    OFFICE PROGRESS NOTE   INTERVAL HISTORY:   She returns as scheduled. She was last treated with FOLFIRI on 04/11/2012. [name] complains of malaise. She denies mouth sores and diarrhea. Mild intermittent nausea. She has occasional pain in the low anterior chest/subxiphoid region. No associated symptoms.  Objective:  Vital signs in last 24 hours:  Blood pressure 146/76, pulse 74, temperature 97.1 F (36.2 C), temperature source Oral, height 5\' 1"  (1.549 m), weight 212 lb 8 oz (96.389 kg).    HEENT: No thrush or ulcers Lymphatics: No cervical, supraclavicular, or axillary nodes Resp: Lungs clear bilaterally Cardio: Regular rate and rhythm GI: No hepatosplenomegaly, no mass Vascular: Trace pitting edema at the low leg bilaterally    Portacath/PICC-without erythema  Lab Results:  Lab Results  Component Value Date   WBC 5.7 05/09/2012   HGB 9.2* 05/09/2012   HCT 28.0* 05/09/2012   MCV 97.2 05/09/2012   PLT 337 05/09/2012   ANC 3.2 Urine protein negative X-rays: A restaging CT of the abdomen on 05/08/2012 was compared to a PET scan from 12/24/2011-I reviewed the CT with a [facility] radiologist. 2 of the 3 liver lesions are larger and a third lesion appears smaller. No new lesions. No extrahepatic metastases. Medications: I have reviewed the patient's current medications.  Assessment/Plan: 1. Metastatic colon cancer presenting with synchronous colon primaries in September, 2008, status post a hemicolectomy and liver biopsy 08/03/2007. A CT on 04/20/2010 revealed no evidence metastatic disease. She was maintained on 5-FU/leucovorin per the FOLFOX regimen beginning in April, 2009. Avasta was discontinued from the chemotherapy regimen beginning in November, 2009 due to nephrotic range proteinuria. A CT of the abdomen and pelvis 02/05/2011 revealed evidence of three small liver lesions. An MRI of the abdomen on 03/02/2011 confirmed 3 liver lesions and  no additional evidence of metastatic disease. K-RAS testing on the transverse colon and left colon tumors revealed a wild type genotype.  1A. Staging PET scan 03/21/2011 confirmed 3 hypermetabolic liver lesions and no additional evidence of metastatic disease.  1B. Surgical consultation by Dr. Donell Beers was obtained and Ms. Calabria decided against an attempt at surgical resection/radiofrequency ablation.  1C. Salvage therapy with FOLFOX was initiated 04/13/2011.  1D. A restaging CT 06/28/2011 revealed a significant improvement in the hepatic metastasis.  1E. She completed eight cycles of FOLFOX chemotherapy. Treatment was switched to 5FU-leucovorin as per the FOLFOX regimen beginning with cycle #9 due to an allergic reaction (skin rash) following cycle #8.  28F. Restaging CT 10/25/2011 revealed 2 more conspicuous liver metastases .  1G. Restaging PET scan 12/24/2011 revealed an increase in the size and metabolic activity of 3 liver metastases  1H. She began FOLFIRI chemotherapy on 01/19/2012. She has completed 6 cycles.  1 I.  Restaging CT of the abdomen 05/08/2012 revealed a mixed response with enlargement of 2 liver lesions and a decrease in the size of a third lesion, no new lesions. 1. Diabetes. 2. History of proteinuria secondary to Avastin versus diabetes- no proteinuria on a urine dipstick today. 3. Mucositis and hand foot syndrome, secondary to 5FU improved with a dose reduction of the 5-FU. 4. Anemia secondary to chemotherapy. 5. Left back and left leg pain with an MRI of the lumbar spine confirming lumbar disk disease. She is status post surgery by Dr. Franky Macho 02/14/2008 with resolution of the back and left leg pain. 6. History of rosacea.  7. Depression, maintained on Lexapro. 8. History of bilateral knee  pain secondary to arthritis -- she takes Vicodin as needed. 9. History of anorexia -- weight loss -- ?Related to depression/anxiety or metastatic colon cancer. 10. Allergic reaction to  oxaliplatin with FOLFOX on 04/13/2011. She was able to tolerate FOLFOX on 04/20/2011 with steroid premedication and a prolonged oxaliplatin infusion. After cycle #8 she developed a rash upon returning home with pruritus. She took Benadryl with resolution of symptoms. This reaction occurred despite steroid premedication. 11. Poor dentition -- she has been evaluated by Dr. Dalene Carrow. She has elected to delay recommended tooth extractions 12. Neutropenia following cycle 1 of FOLFIRI. The FOLFIRI was dose reduced beginning with cycle 2. She was neutropenic 03/14/2012 and chemotherapy was held for one week. She received Neulasta with cycle 5 on the day of pump discontinuation. 13. Mild hand-foot syndrome secondary to 5-fluorouracil. 14. Nausea and diarrhea during the irinotecan infusion on 03/21/2012. Symptoms resolved with atropine. She receives atropine as a premedication with each cycle.  Disposition:  She appears stable. I discussed a restaging CT with the patient and her daughter. There has been a mixed response in the liver and no evidence of new metastases or metastatic disease outside of the liver. She understands chemotherapy will not be curative. She has decided against a hepatectomy procedure.  We decided to continue the FOLFIRI and she will resume Avastin therapy. We again reviewed the potential toxicities associated with Avastin.  The plan is to obtain a restaging CT evaluation at a three-month interval.   Thornton Papas, MD  05/09/2012  1:57 PM

## 2012-05-11 ENCOUNTER — Ambulatory Visit (HOSPITAL_BASED_OUTPATIENT_CLINIC_OR_DEPARTMENT_OTHER): Payer: Medicare Other

## 2012-05-11 VITALS — BP 144/76 | HR 80 | Temp 98.7°F

## 2012-05-11 DIAGNOSIS — C787 Secondary malignant neoplasm of liver and intrahepatic bile duct: Secondary | ICD-10-CM

## 2012-05-11 DIAGNOSIS — Z452 Encounter for adjustment and management of vascular access device: Secondary | ICD-10-CM

## 2012-05-11 DIAGNOSIS — C189 Malignant neoplasm of colon, unspecified: Secondary | ICD-10-CM

## 2012-05-11 MED ORDER — HEPARIN SOD (PORK) LOCK FLUSH 100 UNIT/ML IV SOLN
500.0000 [IU] | Freq: Once | INTRAVENOUS | Status: AC | PRN
  Administered 2012-05-11: 500 [IU]
  Filled 2012-05-11: qty 5

## 2012-05-11 MED ORDER — SODIUM CHLORIDE 0.9 % IJ SOLN
10.0000 mL | INTRAMUSCULAR | Status: DC | PRN
  Administered 2012-05-11: 10 mL
  Filled 2012-05-11: qty 10

## 2012-05-12 ENCOUNTER — Encounter: Payer: Self-pay | Admitting: *Deleted

## 2012-05-12 NOTE — Progress Notes (Signed)
[facility] Brief Psychosocial Assessment Clinical Social Work  Clinical Social Work was referred by patient navigator for assessment of psychosocial needs.  Clinical Social Worker contacted patient at home to offer support and assess for needs.  Pt stated other than feeling fatigued she was doing well.  Pt is currently living at home with her husband who suffers from Parkinson's Disease. Pt does have support from her daughters, but expressed need for additional assistance with home care/needs.  CSW explained options for home care, and that services were not covered by insurance.  Pt stated she was unable to pay out of pocket cost for services at this time.  CSW provided pt with information for [facility] [place] to assist with transportation, food, and other community resources.  CSW also informed pt of [facility]; which offers up to 4 cleaning services on a monthly or bi-weekly basis.  Pt agreed to CSW making referral to [employer], and plans to contact [employer].  CSW provided contact information and encouraged pt to call with any needs or concerns.       Emily Lawson, MSW, LCSW Clinical Social Worker [facility] [phone]

## 2012-05-16 ENCOUNTER — Telehealth: Payer: Self-pay | Admitting: Nurse Practitioner

## 2012-05-16 NOTE — Telephone Encounter (Signed)
Patient called reporting diarrhea after chemotherapy.  States was very bad and she felt lightheaded and had to stay in bed all weekend.  Reports much better today.  States stools are loose, but not watery.  Patient has not taken any OTC medicine for diarrhea.  RN advised pt that diarrhea is a common side effect of Irinotecan.  RN instructed patient on use of imodium for diarrhea.  Also instructed to increase fluid intake (water, Gatorade, Gingerale).  Offered IV fluids but pt declined and stated she is drinking and is feeling better.  RN instructed patient to call if diarrhea worsens, or if patient shows further signs of dehydration.  Pt also requested Dr. Kalman Drape opinion of CT scan.  States "Dr. Truett Perna told me he was going to look at my CT scan and let me know what he thinks".    Information forwarded to Dr. Truett Perna.

## 2012-05-22 ENCOUNTER — Other Ambulatory Visit: Payer: Self-pay | Admitting: Oncology

## 2012-05-22 ENCOUNTER — Telehealth: Payer: Self-pay | Admitting: *Deleted

## 2012-05-22 NOTE — Telephone Encounter (Signed)
Wishes to make change in next chemo appointment schedule since she will be out of town 7/10 and 7/11. FOLFIRI/Avastin every 2 weeks. Forwarded request to scheduler.

## 2012-05-24 ENCOUNTER — Ambulatory Visit (HOSPITAL_BASED_OUTPATIENT_CLINIC_OR_DEPARTMENT_OTHER): Payer: Medicare Other

## 2012-05-24 ENCOUNTER — Ambulatory Visit (HOSPITAL_BASED_OUTPATIENT_CLINIC_OR_DEPARTMENT_OTHER): Payer: Medicare Other | Admitting: Oncology

## 2012-05-24 ENCOUNTER — Other Ambulatory Visit (HOSPITAL_BASED_OUTPATIENT_CLINIC_OR_DEPARTMENT_OTHER): Payer: Medicare Other | Admitting: Lab

## 2012-05-24 VITALS — BP 168/73 | HR 87 | Temp 96.8°F | Ht 61.0 in | Wt 216.3 lb

## 2012-05-24 DIAGNOSIS — C787 Secondary malignant neoplasm of liver and intrahepatic bile duct: Secondary | ICD-10-CM

## 2012-05-24 DIAGNOSIS — C189 Malignant neoplasm of colon, unspecified: Secondary | ICD-10-CM

## 2012-05-24 DIAGNOSIS — Z5111 Encounter for antineoplastic chemotherapy: Secondary | ICD-10-CM

## 2012-05-24 DIAGNOSIS — E119 Type 2 diabetes mellitus without complications: Secondary | ICD-10-CM

## 2012-05-24 LAB — COMPREHENSIVE METABOLIC PANEL
Albumin: 3.5 g/dL (ref 3.5–5.2)
BUN: 17 mg/dL (ref 6–23)
Calcium: 8.5 mg/dL (ref 8.4–10.5)
Chloride: 104 mEq/L (ref 96–112)
Glucose, Bld: 171 mg/dL — ABNORMAL HIGH (ref 70–99)
Potassium: 5.3 mEq/L (ref 3.5–5.3)

## 2012-05-24 LAB — CBC WITH DIFFERENTIAL/PLATELET
Basophils Absolute: 0 10*3/uL (ref 0.0–0.1)
HCT: 28.1 % — ABNORMAL LOW (ref 34.8–46.6)
HGB: 9.1 g/dL — ABNORMAL LOW (ref 11.6–15.9)
LYMPH%: 27 % (ref 14.0–49.7)
MCH: 31.5 pg (ref 25.1–34.0)
MONO#: 0.4 10*3/uL (ref 0.1–0.9)
NEUT%: 56.3 % (ref 38.4–76.8)
Platelets: 213 10*3/uL (ref 145–400)
WBC: 4.7 10*3/uL (ref 3.9–10.3)
lymph#: 1.3 10*3/uL (ref 0.9–3.3)

## 2012-05-24 LAB — POCT UA - GLUCOSE/PROTEIN: Protein, UA: NEGATIVE

## 2012-05-24 MED ORDER — SODIUM CHLORIDE 0.9 % IV SOLN
1500.0000 mg/m2 | INTRAVENOUS | Status: DC
  Administered 2012-05-24: 3000 mg via INTRAVENOUS
  Filled 2012-05-24: qty 60

## 2012-05-24 MED ORDER — DEXAMETHASONE SODIUM PHOSPHATE 4 MG/ML IJ SOLN
20.0000 mg | Freq: Once | INTRAMUSCULAR | Status: AC
  Administered 2012-05-24: 20 mg via INTRAVENOUS

## 2012-05-24 MED ORDER — SODIUM CHLORIDE 0.9 % IV SOLN
Freq: Once | INTRAVENOUS | Status: AC
  Administered 2012-05-24: 11:00:00 via INTRAVENOUS

## 2012-05-24 MED ORDER — ONDANSETRON 16 MG/50ML IVPB (CHCC)
16.0000 mg | Freq: Once | INTRAVENOUS | Status: AC
  Administered 2012-05-24: 16 mg via INTRAVENOUS

## 2012-05-24 MED ORDER — LEUCOVORIN CALCIUM INJECTION 350 MG
285.0000 mg/m2 | Freq: Once | INTRAVENOUS | Status: AC
  Administered 2012-05-24: 572 mg via INTRAVENOUS
  Filled 2012-05-24: qty 28.6

## 2012-05-24 MED ORDER — SODIUM CHLORIDE 0.9 % IV SOLN
5.0000 mg/kg | Freq: Once | INTRAVENOUS | Status: AC
  Administered 2012-05-24: 475 mg via INTRAVENOUS
  Filled 2012-05-24: qty 19

## 2012-05-24 MED ORDER — IRINOTECAN HCL CHEMO INJECTION 100 MG/5ML
140.0000 mg/m2 | Freq: Once | INTRAVENOUS | Status: AC
  Administered 2012-05-24: 282 mg via INTRAVENOUS
  Filled 2012-05-24: qty 14.1

## 2012-05-24 MED ORDER — FLUOROURACIL CHEMO INJECTION 500 MG/10ML
234.0000 mg/m2 | Freq: Once | INTRAVENOUS | Status: AC
  Administered 2012-05-24: 450 mg via INTRAVENOUS
  Filled 2012-05-24: qty 9

## 2012-05-24 MED ORDER — ATROPINE SULFATE 0.4 MG/ML IJ SOLN
0.5000 mg | Freq: Once | INTRAMUSCULAR | Status: AC | PRN
  Administered 2012-05-24: 0.5 mg via INTRAVENOUS
  Filled 2012-05-24: qty 1.25

## 2012-05-24 NOTE — Patient Instructions (Signed)
[facility] Discharge Instructions for Patients Receiving Chemotherapy  Today you received the following chemotherapy agents leucovorin, irinotecan, 5FU, avastin  To help prevent nausea and vomiting after your treatment, we encourage you to take your nausea medication.      If you develop nausea and vomiting that is not controlled by your nausea medication, call the clinic. If it is after clinic hours your family physician or the after hours number for the clinic or go to the Emergency Department.   BELOW ARE SYMPTOMS THAT SHOULD BE REPORTED IMMEDIATELY:  *FEVER GREATER THAN 100.5 F  *CHILLS WITH OR WITHOUT FEVER  NAUSEA AND VOMITING THAT IS NOT CONTROLLED WITH YOUR NAUSEA MEDICATION  *UNUSUAL SHORTNESS OF BREATH  *UNUSUAL BRUISING OR BLEEDING  TENDERNESS IN MOUTH AND THROAT WITH OR WITHOUT PRESENCE OF ULCERS  *URINARY PROBLEMS  *BOWEL PROBLEMS  UNUSUAL RASH Items with * indicate a potential emergency and should be followed up as soon as possible.  Feel free to call the if clinic you have any questions or concerns. The clinic phone number is [phone].   I have been informed and understand all the instructions given to me. I know to contact the clinic, my physician, or go to the Emergency Department if any problems should occur. I do not have any questions at this time, but understand that I may call the clinic during office hours   should I have any questions or need assistance in obtaining follow up care.    __________________________________________  _____________  __________ Signature of Patient or Authorized Representative            Date                   Time    __________________________________________ Nurse's Signature

## 2012-05-24 NOTE — Progress Notes (Signed)
[facility]    OFFICE PROGRESS NOTE   INTERVAL HISTORY:   She returns as scheduled. She completed another cycle of FOLFIRI and Avastin on 05/09/2012.  She reports feeling "out of it "a few days after the chemotherapy. She had diarrhea for a week following chemotherapy. The diarrhea improved with Imodium. No diarrhea today. No symptoms of venous or arterial thrombosis. No bleeding.  Objective:  Vital signs in last 24 hours:  Blood pressure 168/73, pulse 87, temperature 96.8 F (36 C), height 5\' 1"  (1.549 m), weight 216 lb 4.8 oz (98.113 kg).    HEENT: No thrush or ulcers Resp: Lungs clear bilaterally Cardio: Regular rate and rhythm GI: No hepatomegaly, nontender Vascular: Trace pitting edema at the low leg bilaterally   Portacath/PICC-without erythema  Lab Results:  Lab Results  Component Value Date   WBC 4.7 05/24/2012   HGB 9.1* 05/24/2012   HCT 28.1* 05/24/2012   MCV 97.2 05/24/2012   PLT 213 05/24/2012   ANC 2.6 Urine protein-negative   Medications: I have reviewed the patient's current medications.  Assessment/Plan: 1. Metastatic colon cancer presenting with synchronous colon primaries in September, 2008, status post a hemicolectomy and liver biopsy 08/03/2007. A CT on 04/20/2010 revealed no evidence metastatic disease. She was maintained on 5-FU/leucovorin per the FOLFOX regimen beginning in April, 2009. Avasta was discontinued from the chemotherapy regimen beginning in November, 2009 due to nephrotic range proteinuria. A CT of the abdomen and pelvis 02/05/2011 revealed evidence of three small liver lesions. An MRI of the abdomen on 03/02/2011 confirmed 3 liver lesions and no additional evidence of metastatic disease. K-RAS testing on the transverse colon and left colon tumors revealed a wild type genotype.  1A. Staging PET scan 03/21/2011 confirmed 3 hypermetabolic liver lesions and no additional evidence of metastatic disease.  1B. Surgical  consultation by Dr. Donell Beers was obtained and Ms. Xiao decided against an attempt at surgical resection/radiofrequency ablation.  1C. Salvage therapy with FOLFOX was initiated 04/13/2011.  1D. A restaging CT 06/28/2011 revealed a significant improvement in the hepatic metastasis.  1E. She completed eight cycles of FOLFOX chemotherapy. Treatment was switched to 5FU-leucovorin as per the FOLFOX regimen beginning with cycle #9 due to an allergic reaction (skin rash) following cycle #8.  61F. Restaging CT 10/25/2011 revealed 2 more conspicuous liver metastases .  1G. Restaging PET scan 12/24/2011 revealed an increase in the size and metabolic activity of 3 liver metastases  1H. She began FOLFIRI chemotherapy on 01/19/2012. She has completed 6 cycles.  1 I. Restaging CT of the abdomen 05/08/2012 revealed a mixed response with enlargement of 2 liver lesions and a decrease in the size of a third lesion, no new lesions.  1. Diabetes. 2. History of proteinuria secondary to Avastin versus diabetes- no proteinuria on a urine dipstick today. 3. Mucositis and hand foot syndrome, secondary to 5FU improved with a dose reduction of the 5-FU. 4. Anemia secondary to chemotherapy. Stable. 5. Left back and left leg pain with an MRI of the lumbar spine confirming lumbar disk disease. She is status post surgery by Dr. Franky Macho 02/14/2008 with resolution of the back and left leg pain. 6. History of rosacea.  7. Depression, maintained on Cymbalta 8. History of bilateral knee pain secondary to arthritis -- she takes Vicodin as needed. 9. History of anorexia -- weight loss -- ?Related to depression/anxiety or metastatic colon cancer. 10. Allergic reaction to oxaliplatin with FOLFOX on 04/13/2011. She was able to tolerate FOLFOX on 04/20/2011  with steroid premedication and a prolonged oxaliplatin infusion. After cycle #8 she developed a rash upon returning home with pruritus. She took Benadryl with resolution of symptoms. This  reaction occurred despite steroid premedication. 11. Poor dentition -- she has been evaluated by Dr. Dalene Carrow. She has elected to delay recommended tooth extractions 12. Neutropenia following cycle 1 of FOLFIRI. The FOLFIRI was dose reduced beginning with cycle 2. She was neutropenic 03/14/2012 and chemotherapy was held for one week. She received Neulasta  13. Nausea and diarrhea during the irinotecan infusion on 03/21/2012. Symptoms resolved with atropine. She receives atropine as a premedication with each cycle. 14. Diarrhea following the 05/09/2012 chemotherapy cycle-likely related to irinotecan. She will contact us for significant diarrhea following this cycle.  Disposition:  Her overall status is stable. The plan is to continue FOLFIRI/Avastin. She will complete another cycle of chemotherapy today and then return for an office visit and chemotherapy on 06/14/2012.   Thornton Papas, MD  05/24/2012  9:32 PM

## 2012-05-25 ENCOUNTER — Telehealth: Payer: Self-pay | Admitting: *Deleted

## 2012-05-25 ENCOUNTER — Telehealth: Payer: Self-pay | Admitting: Oncology

## 2012-05-25 NOTE — Telephone Encounter (Signed)
called pt lmovm for appt s on 07/18 and to pick up schedule on 06/28

## 2012-05-25 NOTE — Telephone Encounter (Signed)
Per staff message I have scheudled appts.  JMW  

## 2012-05-26 ENCOUNTER — Ambulatory Visit (HOSPITAL_BASED_OUTPATIENT_CLINIC_OR_DEPARTMENT_OTHER): Payer: Medicare Other

## 2012-05-26 VITALS — BP 140/71 | HR 76 | Temp 97.1°F

## 2012-05-26 DIAGNOSIS — C189 Malignant neoplasm of colon, unspecified: Secondary | ICD-10-CM

## 2012-05-26 DIAGNOSIS — C787 Secondary malignant neoplasm of liver and intrahepatic bile duct: Secondary | ICD-10-CM

## 2012-05-26 MED ORDER — HEPARIN SOD (PORK) LOCK FLUSH 100 UNIT/ML IV SOLN
500.0000 [IU] | Freq: Once | INTRAVENOUS | Status: AC | PRN
  Administered 2012-05-26: 500 [IU]
  Filled 2012-05-26: qty 5

## 2012-05-26 MED ORDER — SODIUM CHLORIDE 0.9 % IJ SOLN
10.0000 mL | INTRAMUSCULAR | Status: DC | PRN
  Administered 2012-05-26: 10 mL
  Filled 2012-05-26: qty 10

## 2012-05-26 NOTE — Patient Instructions (Signed)
Call MD for problems 

## 2012-05-29 ENCOUNTER — Other Ambulatory Visit: Payer: Self-pay | Admitting: Certified Registered Nurse Anesthetist

## 2012-05-30 ENCOUNTER — Other Ambulatory Visit: Payer: Self-pay | Admitting: *Deleted

## 2012-05-30 DIAGNOSIS — K1379 Other lesions of oral mucosa: Secondary | ICD-10-CM

## 2012-05-30 MED ORDER — FIRST-DUKES MOUTHWASH MT SUSP: 10.0000 mL | Freq: Four times a day (QID) | OROMUCOSAL | Status: DC | PRN

## 2012-05-30 MED ORDER — MAGIC MOUTHWASH: 10.0000 mL | Freq: Four times a day (QID) | ORAL | Status: DC | PRN

## 2012-05-30 NOTE — Telephone Encounter (Signed)
Has developed some mouth tenderness and requests refill on [facility]-her prior script has expired.

## 2012-06-14 ENCOUNTER — Other Ambulatory Visit: Payer: Self-pay | Admitting: Oncology

## 2012-06-15 ENCOUNTER — Ambulatory Visit (HOSPITAL_BASED_OUTPATIENT_CLINIC_OR_DEPARTMENT_OTHER): Payer: Medicare Other

## 2012-06-15 ENCOUNTER — Other Ambulatory Visit (HOSPITAL_BASED_OUTPATIENT_CLINIC_OR_DEPARTMENT_OTHER): Payer: Medicare Other | Admitting: Lab

## 2012-06-15 ENCOUNTER — Ambulatory Visit (HOSPITAL_BASED_OUTPATIENT_CLINIC_OR_DEPARTMENT_OTHER): Payer: Medicare Other | Admitting: Nurse Practitioner

## 2012-06-15 ENCOUNTER — Telehealth: Payer: Self-pay | Admitting: Oncology

## 2012-06-15 VITALS — BP 135/77 | HR 74

## 2012-06-15 VITALS — BP 160/64 | HR 70 | Temp 97.3°F | Ht 61.0 in | Wt 211.7 lb

## 2012-06-15 DIAGNOSIS — D649 Anemia, unspecified: Secondary | ICD-10-CM

## 2012-06-15 DIAGNOSIS — C189 Malignant neoplasm of colon, unspecified: Secondary | ICD-10-CM

## 2012-06-15 DIAGNOSIS — Z5111 Encounter for antineoplastic chemotherapy: Secondary | ICD-10-CM

## 2012-06-15 DIAGNOSIS — R197 Diarrhea, unspecified: Secondary | ICD-10-CM

## 2012-06-15 DIAGNOSIS — C787 Secondary malignant neoplasm of liver and intrahepatic bile duct: Secondary | ICD-10-CM

## 2012-06-15 DIAGNOSIS — Z5112 Encounter for antineoplastic immunotherapy: Secondary | ICD-10-CM

## 2012-06-15 LAB — CBC WITH DIFFERENTIAL/PLATELET
BASO%: 0.8 % (ref 0.0–2.0)
EOS%: 4.1 % (ref 0.0–7.0)
MCH: 32.1 pg (ref 25.1–34.0)
MCHC: 32.4 g/dL (ref 31.5–36.0)
MCV: 99.4 fL (ref 79.5–101.0)
MONO%: 6.6 % (ref 0.0–14.0)
RDW: 15.8 % — ABNORMAL HIGH (ref 11.2–14.5)
lymph#: 2 10*3/uL (ref 0.9–3.3)

## 2012-06-15 LAB — COMPREHENSIVE METABOLIC PANEL
ALT: 9 U/L (ref 0–35)
AST: 16 U/L (ref 0–37)
Albumin: 3.5 g/dL (ref 3.5–5.2)
Alkaline Phosphatase: 98 U/L (ref 39–117)
Calcium: 9.6 mg/dL (ref 8.4–10.5)
Chloride: 100 mEq/L (ref 96–112)
Creatinine, Ser: 1.18 mg/dL — ABNORMAL HIGH (ref 0.50–1.10)
Potassium: 4.7 mEq/L (ref 3.5–5.3)

## 2012-06-15 MED ORDER — ONDANSETRON 16 MG/50ML IVPB (CHCC)
16.0000 mg | Freq: Once | INTRAVENOUS | Status: AC
  Administered 2012-06-15: 16 mg via INTRAVENOUS

## 2012-06-15 MED ORDER — SODIUM CHLORIDE 0.9 % IV SOLN
5.0000 mg/kg | Freq: Once | INTRAVENOUS | Status: AC
  Administered 2012-06-15: 475 mg via INTRAVENOUS
  Filled 2012-06-15: qty 19

## 2012-06-15 MED ORDER — SODIUM CHLORIDE 0.9 % IV SOLN
Freq: Once | INTRAVENOUS | Status: AC
  Administered 2012-06-15: 12:00:00 via INTRAVENOUS

## 2012-06-15 MED ORDER — SODIUM CHLORIDE 0.9 % IV SOLN
1500.0000 mg/m2 | INTRAVENOUS | Status: DC
  Administered 2012-06-15: 3000 mg via INTRAVENOUS
  Filled 2012-06-15: qty 60

## 2012-06-15 MED ORDER — DEXAMETHASONE SODIUM PHOSPHATE 4 MG/ML IJ SOLN
20.0000 mg | Freq: Once | INTRAMUSCULAR | Status: AC
  Administered 2012-06-15: 20 mg via INTRAVENOUS

## 2012-06-15 MED ORDER — ATROPINE SULFATE 1 MG/ML IJ SOLN
0.5000 mg | Freq: Once | INTRAMUSCULAR | Status: AC | PRN
  Administered 2012-06-15: 0.5 mg via INTRAVENOUS

## 2012-06-15 MED ORDER — IRINOTECAN HCL CHEMO INJECTION 100 MG/5ML
140.0000 mg/m2 | Freq: Once | INTRAVENOUS | Status: AC
  Administered 2012-06-15: 282 mg via INTRAVENOUS
  Filled 2012-06-15: qty 14.1

## 2012-06-15 MED ORDER — LEUCOVORIN CALCIUM INJECTION 350 MG
285.0000 mg/m2 | Freq: Once | INTRAVENOUS | Status: AC
  Administered 2012-06-15: 572 mg via INTRAVENOUS
  Filled 2012-06-15: qty 28.6

## 2012-06-15 MED ORDER — FLUOROURACIL CHEMO INJECTION 500 MG/10ML
234.0000 mg/m2 | Freq: Once | INTRAVENOUS | Status: AC
  Administered 2012-06-15: 450 mg via INTRAVENOUS
  Filled 2012-06-15: qty 9

## 2012-06-15 NOTE — Progress Notes (Signed)
OFFICE PROGRESS NOTE  Interval history:  Emily Lawson returns as scheduled. She completed the most recent cycle of FOLFIRI/Avastin on 05/24/2012. She had nausea toward the end of the infusion on day 1. She had no further nausea. She had intermittent loose stools for one week. She took Imodium as needed with good results. She has a single mouth sore. She denies bleeding. No chest pain. Stable dyspnea on exertion. She reports a recent flare of rosacea. She has seen her dermatologist.   Objective: Temperature 97.3, heart rate 70, blood pressure 160/64. Weight 211.7 pounds.  Single tiny healing ulceration at the left lower inner lip. Mucous membranes are moist. Bilateral malar regions with erythema/acne-like lesions. Lungs are clear. Regular cardiac rhythm. Port-A-Cath site is without erythema. Abdomen is soft and nontender. No hepatomegaly. 1+ pitting edema at the lower legs bilaterally. Calves are soft and nontender.  Lab Results: Lab Results  Component Value Date   WBC 9.1 06/15/2012   HGB 10.0* 06/15/2012   HCT 31.0* 06/15/2012   MCV 99.4 06/15/2012   PLT 332 06/15/2012    Chemistry:    Chemistry      Component Value Date/Time   NA 136 05/24/2012 0929   K 5.3 05/24/2012 0929   CL 104 05/24/2012 0929   CO2 20 05/24/2012 0929   BUN 17 05/24/2012 0929   CREATININE 1.12* 05/24/2012 0929   CREATININE 1.29* 05/20/2009 1423   GLU 102* 10/19/2007 1433      Component Value Date/Time   CALCIUM 8.5 05/24/2012 0929   ALKPHOS 86 05/24/2012 0929   AST 17 05/24/2012 0929   ALT 11 05/24/2012 0929   BILITOT 0.4 05/24/2012 0929       Studies/Results: No results found.  Medications: I have reviewed the patient's current medications.  Assessment/Plan:  1. Metastatic colon cancer presenting with synchronous colon primaries in September, 2008, status post a hemicolectomy and liver biopsy 08/03/2007. A CT on 04/20/2010 revealed no evidence metastatic disease. She was maintained on 5-FU/leucovorin per the  FOLFOX regimen beginning in April, 2009. Avasta was discontinued from the chemotherapy regimen beginning in November, 2009 due to nephrotic range proteinuria. A CT of the abdomen and pelvis 02/05/2011 revealed evidence of three small liver lesions. An MRI of the abdomen on 03/02/2011 confirmed 3 liver lesions and no additional evidence of metastatic disease. K-RAS testing on the transverse colon and left colon tumors revealed a wild type genotype.  1A. Staging PET scan 03/21/2011 confirmed 3 hypermetabolic liver lesions and no additional evidence of metastatic disease.  1B. Surgical consultation by Dr. Donell Beers was obtained and Emily Lawson decided against an attempt at surgical resection/radiofrequency ablation.  1C. Salvage therapy with FOLFOX was initiated 04/13/2011.  1D. A restaging CT 06/28/2011 revealed a significant improvement in the hepatic metastasis.  1E. She completed eight cycles of FOLFOX chemotherapy. Treatment was switched to 5FU-leucovorin as per the FOLFOX regimen beginning with cycle #9 due to an allergic reaction (skin rash) following cycle #8.  36F. Restaging CT 10/25/2011 revealed 2 more conspicuous liver metastases .  1G. Restaging PET scan 12/24/2011 revealed an increase in the size and metabolic activity of 3 liver metastases  1H. She began FOLFIRI chemotherapy on 01/19/2012. She has completed 6 cycles.  1 I. Restaging CT of the abdomen 05/08/2012 revealed a mixed response with enlargement of 2 liver lesions and a decrease in the size of a third lesion, no new lesions.  1J. FOLFIRI chemotherapy continued with the addition of Avastin beginning 05/09/2012. 1. Diabetes. 2. History  of proteinuria secondary to Avastin versus diabetes. 3. Mucositis and hand foot syndrome, secondary to 5FU improved with a dose reduction of the 5-FU. 4. Anemia secondary to chemotherapy. Stable. 5. Left back and left leg pain with an MRI of the lumbar spine confirming lumbar disk disease. She is status post  surgery by Dr. Franky Macho 02/14/2008 with resolution of the back and left leg pain. 6. Rosacea. She is followed by dermatology. 7. Depression, maintained on Cymbalta 8. History of bilateral knee pain secondary to arthritis -- she takes Vicodin as needed. 9. History of anorexia -- weight loss -- ?Related to depression/anxiety or metastatic colon cancer. 10. Allergic reaction to oxaliplatin with FOLFOX on 04/13/2011. She was able to tolerate FOLFOX on 04/20/2011 with steroid premedication and a prolonged oxaliplatin infusion. After cycle #8 she developed a rash upon returning home with pruritus. She took Benadryl with resolution of symptoms. This reaction occurred despite steroid premedication. 11. Poor dentition -- she has been evaluated by Dr. Dalene Carrow. She has elected to delay recommended tooth extractions. 12. Neutropenia following cycle 1 of FOLFIRI. The FOLFIRI was dose reduced beginning with cycle 2. She was neutropenic 03/14/2012 and chemotherapy was held for one week. She received Neulasta.  13. Nausea and diarrhea during the irinotecan infusion on 03/21/2012. Symptoms resolved with atropine. She receives atropine as a premedication with each cycle. 14. Diarrhea following the 05/09/2012 chemotherapy cycle-likely related to irinotecan. She had intermittent diarrhea following chemotherapy 05/24/2012. The diarrhea was controlled with Imodium.  Disposition-Emily Lawson appears stable. Plan to proceed with FOLFIRI/Avastin today as scheduled. She will return for a followup visit and the next cycle on 07/04/2012. She will contact the office in the interim with any problems.  Emily Lawson ANP/GNP-BC

## 2012-06-15 NOTE — Patient Instructions (Signed)
Diarrhea . In general, eat normally while drinking more water than usual. Although water may prevent dehydration, it does not contain salt and minerals (electrolytes). Broths, weak tea without caffeine and oral rehydration solutions (ORS) replace fluids and electrolytes. Small amounts of fluids should be taken frequently. Large amounts at one time may not be tolerated. Plain water may be harmful in infants and the elderly. Oral rehydrating solutions (ORS) are available at pharmacies and grocery stores. ORS replace water and important electrolytes in proper proportions. Sports drinks are not as effective as ORS and may be harmful due to sugars worsening diarrhea.  ORS is especially recommended for use in children with diarrhea. As a general guideline for children, replace any new fluid losses from diarrhea and/or vomiting with ORS as follows:     Drink  - 1 cup or 4 - 8 ounces) of ORS for each diarrheal stool or episode of vomiting.   While correcting for dehydration, you should eat normally. However, foods high in sugar should be avoided because this may worsen diarrhea. Large amounts of carbonated soft drinks, juice, gelatin desserts and other highly sugared drinks should be avoided.   Adults should eat normally while drinking more fluids than usual. Drink small amounts of fluids frequently and increase as tolerated. Drink enough fluids to keep urine clear or pale yellow. Broths, weak decaffeinated tea, lemon lime soft drinks (allowed to go flat) and ORS replace fluids and electrolytes.   Avoid:   Carbonated drinks.   Juice.   Extremely hot or cold fluids.   Caffeine drinks.   Fatty, greasy foods.   Alcohol.   Tobacco.   Too much intake of anything at one time.   Gelatin desserts.   Probiotics are active cultures of beneficial bacteria. They may lessen the amount and number of diarrheal stools in adults. Probiotics can be found in yogurt with active cultures and in supplements.     Wash hands well to avoid spreading bacteria and virus.   Anti-diarrheal medications are recommended for adults   Only take over-the-counter or prescription medicines as directed by your caregiver.   For adults, ask your caregiver if you should continue all prescribed and over-the-counter medicines.   If your caregiver has given you a follow-up appointment, it is very important to keep that appointment. Not keeping the appointment could result in a chronic or permanent injury, and disability. If there is any problem keeping the appointment, you must call back to this facility for assistance.  SEEK IMMEDIATE MEDICAL CARE IF:   You are  unable to keep fluids down or other symptoms or problems become worse in spite of treatment.   Vomiting or diarrhea develops and becomes persistent.   There is vomiting of blood or bile (green material).   There is blood in the stool or the stools are black and tarry.   There is no urine output in 6-8 hours or there is only a small amount of very dark urine.   Abdominal pain develops, increases or localizes.   You have a fever greater thanr 100.4.   You develop excessive weakness, dizziness, fainting or extreme thirst.   You develops a rash, stiff neck, severe headache or become irritable or sleepy and difficult to awaken.  MAKE SURE YOU:   Understand these instructions.   Will watch your condition.   Will get help right away if you are not doing well or get worse.  Document Released: 11/05/2002 Document Revised: 11/04/2011 Document Reviewed: 09/22/2009 [employer] Patient Information  [address] [place]. Place GEN diarrhea patient instructions here.  Diarrhea . In general, eat normally while drinking more water than usual. Although water may prevent dehydration, it does not contain salt and minerals (electrolytes). Broths, weak tea without caffeine and oral rehydration solutions (ORS) replace fluids and electrolytes. Small amounts of fluids  should be taken frequently. Large amounts at one time may not be tolerated. Plain water may be harmful in infants and the elderly. Oral rehydrating solutions (ORS) are available at pharmacies and grocery stores. ORS replace water and important electrolytes in proper proportions. Sports drinks are not as effective as ORS and may be harmful due to sugars worsening diarrhea.  ORS is especially recommended for use in children with diarrhea. As a general guideline for children, replace any new fluid losses from diarrhea and/or vomiting with ORS as follows:     Drink  - 1 cup or 4 - 8 ounces) of ORS for each diarrheal stool or episode of vomiting.   While correcting for dehydration, you should eat normally. However, foods high in sugar should be avoided because this may worsen diarrhea. Large amounts of carbonated soft drinks, juice, gelatin desserts and other highly sugared drinks should be avoided.   Adults should eat normally while drinking more fluids than usual. Drink small amounts of fluids frequently and increase as tolerated. Drink enough fluids to keep urine clear or pale yellow. Broths, weak decaffeinated tea, lemon lime soft drinks (allowed to go flat) and ORS replace fluids and electrolytes.   Avoid:   Carbonated drinks.   Juice.   Extremely hot or cold fluids.   Caffeine drinks.   Fatty, greasy foods.   Alcohol.  [facility] Discharge Instructions for Patients Receiving Chemotherapy  Today you received the following chemotherapy agents AVASTIN,5FU,LEUCOVORIN,IRINOTECA To help prevent nausea and vomiting after your treatment, we encourage you to take your nausea medication   Take it as often as prescribed.   If you develop nausea and vomiting that is not controlled by your nausea medication, call the clinic. If it is after clinic hours your family physician or the after hours number for the clinic or go to the Emergency Department.   BELOW ARE SYMPTOMS THAT  SHOULD BE REPORTED IMMEDIATELY: *FEVER GREATER THAN 100.5 F *CHILLS WITH OR WITHOUT FEVER NAUSEA AND VOMITING THAT IS NOT CONTROLLED WITH YOUR NAUSEA MEDICATION *UNUSUAL SHORTNESS OF BREATH *UNUSUAL BRUISING OR BLEEDING TENDERNESS IN MOUTH AND THROAT WITH OR WITHOUT PRESENCE OF ULCERS *URINARY PROBLEMS *BOWEL PROBLEMS UNUSUAL RASH Items with * indicate a potential emergency and should be followed up as soon as possible.  If this is your first treatment one of the nurses will contact you 24 hours after your treatment. Please let the nurse know about any problems that you may have experienced. Feel free to call the clinic you have any questions or concerns. The clinic phone number is [phone].   I have been informed and understand all the instructions given to me. I know to contact the clinic, my physician, or go to the Emergency Department if any problems should occur. I do not have any questions at this time, but understand that I may call the clinic during office hours   should I have any questions or need assistance in obtaining follow up care.    __________________________________________  _____________  __________ Signature of Patient or Authorized Representative            Date  Time    __________________________________________ Nurse's Signature      Too much intake of anything at one time.   Gelatin desserts.   Probiotics are active cultures of beneficial bacteria. They may lessen the amount and number of diarrheal stools in adults. Probiotics can be found in yogurt with active cultures and in supplements.   Wash hands well to avoid spreading bacteria and virus.   Anti-diarrheal medications are recommended for adults   Only take over-the-counter or prescription medicines as directed by your caregiver.   For adults, ask your caregiver if you should continue all prescribed and over-the-counter medicines.   If your caregiver has given you a  follow-up appointment, it is very important to keep that appointment. Not keeping the appointment could result in a chronic or permanent injury, and disability. If there is any problem keeping the appointment, you must call back to this facility for assistance.  SEEK IMMEDIATE MEDICAL CARE IF:   You are  unable to keep fluids down or other symptoms or problems become worse in spite of treatment.   Vomiting or diarrhea develops and becomes persistent.   There is vomiting of blood or bile (green material).   There is blood in the stool or the stools are black and tarry.   There is no urine output in 6-8 hours or there is only a small amount of very dark urine.   Abdominal pain develops, increases or localizes.   You have a fever greater thanr 100.4.   You develop excessive weakness, dizziness, fainting or extreme thirst.   You develops a rash, stiff neck, severe headache or become irritable or sleepy and difficult to awaken.  MAKE SURE YOU:   Understand these instructions.   Will watch your condition.   Will get help right away if you are not doing well or get worse.  Document Released: 11/05/2002 Document Revised: 11/04/2011 Document Reviewed: 09/22/2009 [facility] Patient Information 2012 [place], [place]. Place GEN diarrhea patient instructions here.

## 2012-06-15 NOTE — Telephone Encounter (Signed)
appts made and printed for pt  °

## 2012-06-16 ENCOUNTER — Telehealth: Payer: Self-pay | Admitting: Certified Registered Nurse Anesthetist

## 2012-06-16 ENCOUNTER — Ambulatory Visit: Payer: Medicare Other

## 2012-06-16 NOTE — Telephone Encounter (Signed)
Left message for pt to come in tomorrow at 1pm

## 2012-06-16 NOTE — Progress Notes (Signed)
1230-Pt into infusion room with pump alarming.  She requests to have pump changed.  Pump alarm sounding with "unchecked alarm", but unable to retrieve details.  Port site okay.  Pump changed out, settings double checked with A. Horton, RN.  Pt stayed in infusion room for 10 minutes with new pump to assure no further alarms.  Instructed to call with any additional questions or concerns-dhp, rn

## 2012-06-17 ENCOUNTER — Ambulatory Visit (HOSPITAL_BASED_OUTPATIENT_CLINIC_OR_DEPARTMENT_OTHER): Payer: Medicare Other

## 2012-06-17 VITALS — BP 159/75 | HR 71 | Temp 97.0°F

## 2012-06-17 DIAGNOSIS — C189 Malignant neoplasm of colon, unspecified: Secondary | ICD-10-CM

## 2012-06-17 DIAGNOSIS — C787 Secondary malignant neoplasm of liver and intrahepatic bile duct: Secondary | ICD-10-CM

## 2012-06-17 DIAGNOSIS — Z452 Encounter for adjustment and management of vascular access device: Secondary | ICD-10-CM

## 2012-06-17 MED ORDER — SODIUM CHLORIDE 0.9 % IJ SOLN
10.0000 mL | INTRAMUSCULAR | Status: DC | PRN
  Administered 2012-06-17: 10 mL
  Filled 2012-06-17: qty 10

## 2012-06-17 MED ORDER — HEPARIN SOD (PORK) LOCK FLUSH 100 UNIT/ML IV SOLN
500.0000 [IU] | Freq: Once | INTRAVENOUS | Status: AC | PRN
  Administered 2012-06-17: 500 [IU]
  Filled 2012-06-17: qty 5

## 2012-06-21 ENCOUNTER — Telehealth: Payer: Self-pay | Admitting: *Deleted

## 2012-06-21 NOTE — Telephone Encounter (Signed)
Call from pt reporting "dizziness" and nausea. Pt denies vision changes or balance issues, mainly feeling "light headed". She has had a few episodes of loose stools, including one incontinent stool during her chemo tx last week. Began Imodium and has had 2 loose stools in the past 24 hours.  Instructed her to continue Imodium, push PO fluids and change positions slowly.She understands to call office if these symptoms worsen.  Dr. Truett Perna made aware of call, no new orders received.

## 2012-07-02 ENCOUNTER — Other Ambulatory Visit: Payer: Self-pay | Admitting: Oncology

## 2012-07-04 ENCOUNTER — Ambulatory Visit (HOSPITAL_BASED_OUTPATIENT_CLINIC_OR_DEPARTMENT_OTHER): Payer: Medicare Other

## 2012-07-04 ENCOUNTER — Other Ambulatory Visit (HOSPITAL_BASED_OUTPATIENT_CLINIC_OR_DEPARTMENT_OTHER): Payer: Medicare Other | Admitting: Lab

## 2012-07-04 ENCOUNTER — Ambulatory Visit (HOSPITAL_BASED_OUTPATIENT_CLINIC_OR_DEPARTMENT_OTHER): Payer: Medicare Other | Admitting: Nurse Practitioner

## 2012-07-04 VITALS — BP 151/76 | HR 71 | Temp 97.0°F | Resp 20 | Ht 61.0 in | Wt 211.1 lb

## 2012-07-04 DIAGNOSIS — C189 Malignant neoplasm of colon, unspecified: Secondary | ICD-10-CM

## 2012-07-04 DIAGNOSIS — C787 Secondary malignant neoplasm of liver and intrahepatic bile duct: Secondary | ICD-10-CM

## 2012-07-04 DIAGNOSIS — F329 Major depressive disorder, single episode, unspecified: Secondary | ICD-10-CM

## 2012-07-04 DIAGNOSIS — D6481 Anemia due to antineoplastic chemotherapy: Secondary | ICD-10-CM

## 2012-07-04 DIAGNOSIS — Z5111 Encounter for antineoplastic chemotherapy: Secondary | ICD-10-CM

## 2012-07-04 LAB — CBC WITH DIFFERENTIAL/PLATELET
Basophils Absolute: 0.1 10*3/uL (ref 0.0–0.1)
EOS%: 6.3 % (ref 0.0–7.0)
Eosinophils Absolute: 0.4 10*3/uL (ref 0.0–0.5)
HCT: 30.3 % — ABNORMAL LOW (ref 34.8–46.6)
HGB: 10 g/dL — ABNORMAL LOW (ref 11.6–15.9)
MCH: 32 pg (ref 25.1–34.0)
MCV: 97.4 fL (ref 79.5–101.0)
MONO%: 7.2 % (ref 0.0–14.0)
NEUT#: 4.3 10*3/uL (ref 1.5–6.5)
NEUT%: 66.1 % (ref 38.4–76.8)
Platelets: 316 10*3/uL (ref 145–400)

## 2012-07-04 LAB — COMPREHENSIVE METABOLIC PANEL
AST: 15 U/L (ref 0–37)
Albumin: 3.3 g/dL — ABNORMAL LOW (ref 3.5–5.2)
Alkaline Phosphatase: 91 U/L (ref 39–117)
BUN: 17 mg/dL (ref 6–23)
Calcium: 9.7 mg/dL (ref 8.4–10.5)
Chloride: 102 mEq/L (ref 96–112)
Creatinine, Ser: 1.15 mg/dL — ABNORMAL HIGH (ref 0.50–1.10)
Glucose, Bld: 238 mg/dL — ABNORMAL HIGH (ref 70–99)
Potassium: 4.7 mEq/L (ref 3.5–5.3)

## 2012-07-04 MED ORDER — SODIUM CHLORIDE 0.9 % IV SOLN
5.0000 mg/kg | Freq: Once | INTRAVENOUS | Status: AC
  Administered 2012-07-04: 475 mg via INTRAVENOUS
  Filled 2012-07-04: qty 19

## 2012-07-04 MED ORDER — SODIUM CHLORIDE 0.9 % IV SOLN
Freq: Once | INTRAVENOUS | Status: AC
  Administered 2012-07-04: 13:00:00 via INTRAVENOUS

## 2012-07-04 MED ORDER — ONDANSETRON 16 MG/50ML IVPB (CHCC)
16.0000 mg | Freq: Once | INTRAVENOUS | Status: AC
  Administered 2012-07-04: 16 mg via INTRAVENOUS

## 2012-07-04 MED ORDER — LEUCOVORIN CALCIUM INJECTION 350 MG
285.0000 mg/m2 | Freq: Once | INTRAVENOUS | Status: AC
  Administered 2012-07-04: 572 mg via INTRAVENOUS
  Filled 2012-07-04: qty 28.6

## 2012-07-04 MED ORDER — ATROPINE SULFATE 1 MG/ML IJ SOLN
0.5000 mg | Freq: Once | INTRAMUSCULAR | Status: AC | PRN
  Administered 2012-07-04: 0.5 mg via INTRAVENOUS

## 2012-07-04 MED ORDER — DEXAMETHASONE SODIUM PHOSPHATE 4 MG/ML IJ SOLN
20.0000 mg | Freq: Once | INTRAMUSCULAR | Status: AC
  Administered 2012-07-04: 20 mg via INTRAVENOUS

## 2012-07-04 MED ORDER — SODIUM CHLORIDE 0.9 % IV SOLN
1500.0000 mg/m2 | INTRAVENOUS | Status: DC
  Administered 2012-07-04: 3000 mg via INTRAVENOUS
  Filled 2012-07-04: qty 60

## 2012-07-04 MED ORDER — FLUOROURACIL CHEMO INJECTION 500 MG/10ML
234.0000 mg/m2 | Freq: Once | INTRAVENOUS | Status: AC
  Administered 2012-07-04: 450 mg via INTRAVENOUS
  Filled 2012-07-04: qty 9

## 2012-07-04 MED ORDER — IRINOTECAN HCL CHEMO INJECTION 100 MG/5ML
140.0000 mg/m2 | Freq: Once | INTRAVENOUS | Status: AC
  Administered 2012-07-04: 282 mg via INTRAVENOUS
  Filled 2012-07-04: qty 14.1

## 2012-07-04 NOTE — Patient Instructions (Addendum)
[facility] Discharge Instructions for Patients Receiving Chemotherapy  Today you received the following chemotherapy agents Camptosar, Leucovorin, 5-FU and Avastin.  To help prevent nausea and vomiting after your treatment, we encourage you to take your nausea medication as ordered per MD.    If you develop nausea and vomiting that is not controlled by your nausea medication, call the clinic. If it is after clinic hours your family physician or the after hours number for the clinic or go to the Emergency Department.   BELOW ARE SYMPTOMS THAT SHOULD BE REPORTED IMMEDIATELY:  *FEVER GREATER THAN 100.5 F  *CHILLS WITH OR WITHOUT FEVER  NAUSEA AND VOMITING THAT IS NOT CONTROLLED WITH YOUR NAUSEA MEDICATION  *UNUSUAL SHORTNESS OF BREATH  *UNUSUAL BRUISING OR BLEEDING  TENDERNESS IN MOUTH AND THROAT WITH OR WITHOUT PRESENCE OF ULCERS  *URINARY PROBLEMS  *BOWEL PROBLEMS  UNUSUAL RASH Items with * indicate a potential emergency and should be followed up as soon as possible.   Please let the nurse know about any problems that you may have experienced. Feel free to call the clinic you have any questions or concerns. The clinic phone number is [phone].   I have been informed and understand all the instructions given to me. I know to contact the clinic, my physician, or go to the Emergency Department if any problems should occur. I do not have any questions at this time, but understand that I may call the clinic during office hours   should I have any questions or need assistance in obtaining follow up care.    __________________________________________  _____________  __________ Signature of Patient or Authorized Representative            Date                   Time    __________________________________________ Nurse's Signature    

## 2012-07-04 NOTE — Progress Notes (Signed)
OFFICE PROGRESS NOTE  Interval history:  Emily Lawson returns as scheduled. She completed the most recent cycle of FOLFIRI/Avastin on 06/15/2012. She denies nausea/vomiting. She developed a few mouth sores. She had diarrhea for several days. She took Imodium as needed. She denies bleeding. No chest pain. Mild stable dyspnea on exertion.   Objective: Blood pressure 151/76, pulse 71, temperature 97 F (36.1 C), temperature source Oral, resp. rate 20, height 5\' 1"  (1.549 m), weight 211 lb 1.6 oz (95.754 kg).  Oropharynx is without thrush or ulceration. Lungs clear. Regular cardiac rhythm. Port-A-Cath site without erythema. Abdomen soft and nontender. No hepatomegaly. Trace to 1+ pitting edema at the lower legs bilaterally. Calves soft and nontender.  Lab Results: Lab Results  Component Value Date   WBC 6.5 07/04/2012   HGB 10.0* 07/04/2012   HCT 30.3* 07/04/2012   MCV 97.4 07/04/2012   PLT 316 07/04/2012    Chemistry:    Chemistry      Component Value Date/Time   NA 137 07/04/2012 1143   K 4.7 07/04/2012 1143   CL 102 07/04/2012 1143   CO2 21 07/04/2012 1143   BUN 17 07/04/2012 1143   CREATININE 1.15* 07/04/2012 1143   CREATININE 1.29* 05/20/2009 1423   GLU 102* 10/19/2007 1433      Component Value Date/Time   CALCIUM 9.7 07/04/2012 1143   ALKPHOS 91 07/04/2012 1143   AST 15 07/04/2012 1143   ALT 8 07/04/2012 1143   BILITOT 0.3 07/04/2012 1143       Studies/Results: No results found.  Medications: I have reviewed the patient's current medications.  Assessment/Plan:  1. Metastatic colon cancer presenting with synchronous colon primaries in September, 2008, status post a hemicolectomy and liver biopsy 08/03/2007. A CT on 04/20/2010 revealed no evidence metastatic disease. She was maintained on 5-FU/leucovorin per the FOLFOX regimen beginning in April, 2009. Avasta was discontinued from the chemotherapy regimen beginning in November, 2009 due to nephrotic range proteinuria. A CT of the abdomen and pelvis  02/05/2011 revealed evidence of three small liver lesions. An MRI of the abdomen on 03/02/2011 confirmed 3 liver lesions and no additional evidence of metastatic disease. K-RAS testing on the transverse colon and left colon tumors revealed a wild type genotype.  1A. Staging PET scan 03/21/2011 confirmed 3 hypermetabolic liver lesions and no additional evidence of metastatic disease.  1B. Surgical consultation by Dr. Donell Beers was obtained and Emily Lawson decided against an attempt at surgical resection/radiofrequency ablation.  1C. Salvage therapy with FOLFOX was initiated 04/13/2011.  1D. A restaging CT 06/28/2011 revealed a significant improvement in the hepatic metastasis.  1E. She completed eight cycles of FOLFOX chemotherapy. Treatment was switched to 5FU-leucovorin as per the FOLFOX regimen beginning with cycle #9 due to an allergic reaction (skin rash) following cycle #8.  53F. Restaging CT 10/25/2011 revealed 2 more conspicuous liver metastases .  1G. Restaging PET scan 12/24/2011 revealed an increase in the size and metabolic activity of 3 liver metastases  1H. She began FOLFIRI chemotherapy on 01/19/2012. She has completed 6 cycles.  1 I. Restaging CT of the abdomen 05/08/2012 revealed a mixed response with enlargement of 2 liver lesions and a decrease in the size of a third lesion, no new lesions.  1J. FOLFIRI chemotherapy continued with the addition of Avastin beginning 05/09/2012.  1. Diabetes. 2. History of proteinuria secondary to Avastin versus diabetes. 3. Mucositis and hand foot syndrome, secondary to 5FU improved with a dose reduction of the 5-FU. 4. Anemia secondary to  chemotherapy. Stable. 5. Left back and left leg pain with an MRI of the lumbar spine confirming lumbar disk disease. She is status post surgery by Dr. Franky Macho 02/14/2008 with resolution of the back and left leg pain. 6. Rosacea. She is followed by dermatology. 7. Depression, maintained on Cymbalta 8. History of  bilateral knee pain secondary to arthritis -- she takes Vicodin as needed. 9. History of anorexia -- weight loss -- ?Related to depression/anxiety or metastatic colon cancer. 10. Allergic reaction to oxaliplatin with FOLFOX on 04/13/2011. She was able to tolerate FOLFOX on 04/20/2011 with steroid premedication and a prolonged oxaliplatin infusion. After cycle #8 she developed a rash upon returning home with pruritus. She took Benadryl with resolution of symptoms. This reaction occurred despite steroid premedication. 11. Poor dentition -- she has been evaluated by Dr. Dalene Carrow. She has elected to delay recommended tooth extractions. 12. Neutropenia following cycle 1 of FOLFIRI. The FOLFIRI was dose reduced beginning with cycle 2. She was neutropenic 03/14/2012 and chemotherapy was held for one week. She received Neulasta.  13. Nausea and diarrhea during the irinotecan infusion on 03/21/2012. Symptoms resolved with atropine. She receives atropine as a premedication with each cycle. 14. Diarrhea following the 05/09/2012 chemotherapy cycle. Likely related to irinotecan. She takes Imodium as needed.  Disposition-Emily Lawson appears stable. Plan to proceed with FOLFIRI/Avastin today as scheduled. She will return for a followup visit and the next cycle in 2 weeks. She will contact the office in the interim with any problems.  Plan reviewed with Dr. Truett Perna.  Lonna Cobb ANP/GNP-BC

## 2012-07-04 NOTE — Progress Notes (Unsigned)
1310-OK to give Avastin today with urine protein of 100 per L. Maisie Fus NP.

## 2012-07-06 ENCOUNTER — Ambulatory Visit (HOSPITAL_BASED_OUTPATIENT_CLINIC_OR_DEPARTMENT_OTHER): Payer: Medicare Other

## 2012-07-06 VITALS — BP 132/69 | HR 75 | Temp 96.7°F

## 2012-07-06 DIAGNOSIS — C787 Secondary malignant neoplasm of liver and intrahepatic bile duct: Secondary | ICD-10-CM

## 2012-07-06 DIAGNOSIS — C189 Malignant neoplasm of colon, unspecified: Secondary | ICD-10-CM

## 2012-07-06 MED ORDER — SODIUM CHLORIDE 0.9 % IJ SOLN
10.0000 mL | INTRAMUSCULAR | Status: DC | PRN
  Administered 2012-07-06: 10 mL
  Filled 2012-07-06: qty 10

## 2012-07-06 MED ORDER — HEPARIN SOD (PORK) LOCK FLUSH 100 UNIT/ML IV SOLN
500.0000 [IU] | Freq: Once | INTRAVENOUS | Status: AC | PRN
  Administered 2012-07-06: 500 [IU]
  Filled 2012-07-06: qty 5

## 2012-07-06 NOTE — Patient Instructions (Signed)
Call MD with any problems 

## 2012-07-10 ENCOUNTER — Other Ambulatory Visit: Payer: Self-pay | Admitting: Certified Registered Nurse Anesthetist

## 2012-07-16 ENCOUNTER — Other Ambulatory Visit: Payer: Self-pay | Admitting: Oncology

## 2012-07-17 ENCOUNTER — Ambulatory Visit (HOSPITAL_BASED_OUTPATIENT_CLINIC_OR_DEPARTMENT_OTHER): Payer: Medicare Other

## 2012-07-17 ENCOUNTER — Ambulatory Visit (HOSPITAL_BASED_OUTPATIENT_CLINIC_OR_DEPARTMENT_OTHER): Payer: Medicare Other | Admitting: Oncology

## 2012-07-17 ENCOUNTER — Other Ambulatory Visit (HOSPITAL_BASED_OUTPATIENT_CLINIC_OR_DEPARTMENT_OTHER): Payer: Medicare Other | Admitting: Lab

## 2012-07-17 VITALS — BP 128/64 | HR 75 | Temp 97.0°F | Resp 20 | Ht 61.0 in | Wt 210.2 lb

## 2012-07-17 DIAGNOSIS — C189 Malignant neoplasm of colon, unspecified: Secondary | ICD-10-CM

## 2012-07-17 DIAGNOSIS — Z5112 Encounter for antineoplastic immunotherapy: Secondary | ICD-10-CM

## 2012-07-17 DIAGNOSIS — C787 Secondary malignant neoplasm of liver and intrahepatic bile duct: Secondary | ICD-10-CM

## 2012-07-17 DIAGNOSIS — D709 Neutropenia, unspecified: Secondary | ICD-10-CM

## 2012-07-17 DIAGNOSIS — Z5111 Encounter for antineoplastic chemotherapy: Secondary | ICD-10-CM

## 2012-07-17 DIAGNOSIS — T451X5A Adverse effect of antineoplastic and immunosuppressive drugs, initial encounter: Secondary | ICD-10-CM

## 2012-07-17 LAB — CBC WITH DIFFERENTIAL/PLATELET
Eosinophils Absolute: 0.2 10*3/uL (ref 0.0–0.5)
MONO#: 0.2 10*3/uL (ref 0.1–0.9)
MONO%: 6.1 % (ref 0.0–14.0)
NEUT#: 1.3 10*3/uL — ABNORMAL LOW (ref 1.5–6.5)
RBC: 3.14 10*6/uL — ABNORMAL LOW (ref 3.70–5.45)
RDW: 15.1 % — ABNORMAL HIGH (ref 11.2–14.5)
WBC: 3.3 10*3/uL — ABNORMAL LOW (ref 3.9–10.3)
nRBC: 0 % (ref 0–0)

## 2012-07-17 LAB — COMPREHENSIVE METABOLIC PANEL
ALT: 13 U/L (ref 0–35)
CO2: 18 mEq/L — ABNORMAL LOW (ref 19–32)
Chloride: 103 mEq/L (ref 96–112)
Sodium: 135 mEq/L (ref 135–145)
Total Bilirubin: 0.4 mg/dL (ref 0.3–1.2)
Total Protein: 6.5 g/dL (ref 6.0–8.3)

## 2012-07-17 LAB — UA PROTEIN, DIPSTICK - CHCC: Protein, ur: NEGATIVE mg/dL

## 2012-07-17 MED ORDER — LIDOCAINE-PRILOCAINE 2.5-2.5 % EX CREA
TOPICAL_CREAM | Freq: Once | CUTANEOUS | Status: AC
  Administered 2012-07-17: 12:00:00 via TOPICAL

## 2012-07-17 MED ORDER — SODIUM CHLORIDE 0.9 % IV SOLN
Freq: Once | INTRAVENOUS | Status: AC
  Administered 2012-07-17: 13:00:00 via INTRAVENOUS

## 2012-07-17 MED ORDER — IRINOTECAN HCL CHEMO INJECTION 100 MG/5ML
140.0000 mg/m2 | Freq: Once | INTRAVENOUS | Status: AC
  Administered 2012-07-17: 282 mg via INTRAVENOUS
  Filled 2012-07-17: qty 14.1

## 2012-07-17 MED ORDER — SODIUM CHLORIDE 0.9 % IV SOLN
1500.0000 mg/m2 | INTRAVENOUS | Status: DC
  Administered 2012-07-17: 3000 mg via INTRAVENOUS
  Filled 2012-07-17: qty 60

## 2012-07-17 MED ORDER — DEXAMETHASONE SODIUM PHOSPHATE 4 MG/ML IJ SOLN
20.0000 mg | Freq: Once | INTRAMUSCULAR | Status: AC
  Administered 2012-07-17: 20 mg via INTRAVENOUS

## 2012-07-17 MED ORDER — SODIUM CHLORIDE 0.9 % IV SOLN
5.0000 mg/kg | Freq: Once | INTRAVENOUS | Status: AC
  Administered 2012-07-17: 475 mg via INTRAVENOUS
  Filled 2012-07-17: qty 19

## 2012-07-17 MED ORDER — ONDANSETRON 16 MG/50ML IVPB (CHCC)
16.0000 mg | Freq: Once | INTRAVENOUS | Status: AC
  Administered 2012-07-17: 16 mg via INTRAVENOUS

## 2012-07-17 MED ORDER — FLUOROURACIL CHEMO INJECTION 500 MG/10ML
234.0000 mg/m2 | Freq: Once | INTRAVENOUS | Status: AC
  Administered 2012-07-17: 450 mg via INTRAVENOUS
  Filled 2012-07-17: qty 9

## 2012-07-17 MED ORDER — LEUCOVORIN CALCIUM INJECTION 350 MG
285.0000 mg/m2 | Freq: Once | INTRAVENOUS | Status: AC
  Administered 2012-07-17: 572 mg via INTRAVENOUS
  Filled 2012-07-17: qty 28.6

## 2012-07-17 NOTE — Progress Notes (Signed)
[facility]    OFFICE PROGRESS NOTE   INTERVAL HISTORY:   She returns as scheduled. She completed another cycle of FOLFIRI/Avastin on 07/04/2012. No nausea, mouth sores, or diarrhea following chemotherapy. No symptoms of venous or arterial thrombosis. No bleeding. She reports malaise and anorexia.  Objective:  Vital signs in last 24 hours:  Blood pressure 128/64, pulse 75, temperature 97 F (36.1 C), temperature source Oral, resp. rate 20, height 5\' 1"  (1.549 m), weight 210 lb 3.2 oz (95.346 kg).    HEENT: No thrush or ulcers Resp: Lungs clear bilaterally Cardio: Regular Rate and rhythm GI: Nontender, no hepatomegaly Vascular: No leg edema  Portacath/PICC-without erythema  Lab Results:  Lab Results  Component Value Date   WBC 3.3* 07/17/2012   HGB 9.6* 07/17/2012   HCT 29.9* 07/17/2012   MCV 95.2 07/17/2012   PLT 219 07/17/2012   ANC 1.3    Medications: I have reviewed the patient's current medications.  Assessment/Plan: 1. Metastatic colon cancer presenting with synchronous colon primaries in September, 2008, status post a hemicolectomy and liver biopsy 08/03/2007. A CT on 04/20/2010 revealed no evidence metastatic disease. She was maintained on 5-FU/leucovorin per the FOLFOX regimen beginning in April, 2009. Avasta was discontinued from the chemotherapy regimen beginning in November, 2009 due to nephrotic range proteinuria. A CT of the abdomen and pelvis 02/05/2011 revealed evidence of three small liver lesions. An MRI of the abdomen on 03/02/2011 confirmed 3 liver lesions and no additional evidence of metastatic disease. K-RAS testing on the transverse colon and left colon tumors revealed a wild type genotype.  1A. Staging PET scan 03/21/2011 confirmed 3 hypermetabolic liver lesions and no additional evidence of metastatic disease.  1B. Surgical consultation by Dr. Donell Beers was obtained and Ms. Mullinax decided against an attempt at surgical  resection/radiofrequency ablation.  1C. Salvage therapy with FOLFOX was initiated 04/13/2011.  1D. A restaging CT 06/28/2011 revealed a significant improvement in the hepatic metastasis.  1E. She completed eight cycles of FOLFOX chemotherapy. Treatment was switched to 5FU-leucovorin as per the FOLFOX regimen beginning with cycle #9 due to an allergic reaction (skin rash) following cycle #8.  66F. Restaging CT 10/25/2011 revealed 2 more conspicuous liver metastases .  1G. Restaging PET scan 12/24/2011 revealed an increase in the size and metabolic activity of 3 liver metastases  1H. She began FOLFIRI chemotherapy on 01/19/2012. She has completed 6 cycles.  1 I. Restaging CT of the abdomen 05/08/2012 revealed a mixed response with enlargement of 2 liver lesions and a decrease in the size of a third lesion, no new lesions.  1J. FOLFIRI chemotherapy continued with the addition of Avastin beginning 05/09/2012.  1. Diabetes. 2. History of proteinuria secondary to Avastin versus diabetes. 3. Mucositis and hand foot syndrome, secondary to 5FU improved with a dose reduction of the 5-FU. 4. Anemia secondary to chemotherapy. Stable. 5. Left back and left leg pain with an MRI of the lumbar spine confirming lumbar disk disease. She is status post surgery by Dr. Franky Macho 02/14/2008 with resolution of the back and left leg pain. 6. Rosacea. She is followed by dermatology. 7. Depression, maintained on Cymbalta 8. History of bilateral knee pain secondary to arthritis -- she takes Vicodin as needed. 9. History of anorexia -- weight loss -- ?Related to depression/anxiety or metastatic colon cancer. 10. Allergic reaction to oxaliplatin with FOLFOX on 04/13/2011. She was able to tolerate FOLFOX on 04/20/2011 with steroid premedication and a prolonged oxaliplatin infusion. After cycle #8 she  developed a rash upon returning home with pruritus. She took Benadryl with resolution of symptoms. This reaction occurred despite  steroid premedication. 11. Poor dentition -- she has been evaluated by Dr. Dalene Carrow. She has elected to delay recommended tooth extractions. 12. Neutropenia following cycle 1 of FOLFIRI. The FOLFIRI was dose reduced beginning with cycle 2. She was neutropenic 03/14/2012 and chemotherapy was held for one week. She she will receive Neulasta with this cycle of chemotherapy. 13. Nausea and diarrhea during the irinotecan infusion on 03/21/2012. Symptoms resolved with atropine. She receives atropine as a premedication with each cycle. 14. Diarrhea following the 05/09/2012 chemotherapy cycle. Likely related to irinotecan. She takes Imodium as needed.   Disposition:  She appears stable. She continues to tolerate the FOLFIRI/Avastin well. The neutrophil count is borderline low today. The plan is to proceed with chemotherapy and Neulasta support. She will return for an office visit and restaging CT evaluation in 2 weeks.   Thornton Papas, MD  07/17/2012  1:36 PM

## 2012-07-17 NOTE — Patient Instructions (Signed)
[facility] Discharge Instructions for Patients Receiving Chemotherapy  Today you received the following chemotherapy agents FOLFIRI/Avastin  To help prevent nausea and vomiting after your treatment, we encourage you to take your nausea medication.   If you develop nausea and vomiting that is not controlled by your nausea medication, call the clinic. If it is after clinic hours your family physician or the after hours number for the clinic or go to the Emergency Department.   BELOW ARE SYMPTOMS THAT SHOULD BE REPORTED IMMEDIATELY:  *FEVER GREATER THAN 100.5 F  *CHILLS WITH OR WITHOUT FEVER  NAUSEA AND VOMITING THAT IS NOT CONTROLLED WITH YOUR NAUSEA MEDICATION  *UNUSUAL SHORTNESS OF BREATH  *UNUSUAL BRUISING OR BLEEDING  TENDERNESS IN MOUTH AND THROAT WITH OR WITHOUT PRESENCE OF ULCERS  *URINARY PROBLEMS  *BOWEL PROBLEMS  UNUSUAL RASH Items with * indicate a potential emergency and should be followed up as soon as possible.  One of the nurses will contact you 24 hours after your treatment. Please let the nurse know about any problems that you may have experienced. Feel free to call the clinic you have any questions or concerns. The clinic phone number is [phone].   I have been informed and understand all the instructions given to me. I know to contact the clinic, my physician, or go to the Emergency Department if any problems should occur. I do not have any questions at this time, but understand that I may call the clinic during office hours   should I have any questions or need assistance in obtaining follow up care.    __________________________________________  _____________  __________ Signature of Patient or Authorized Representative            Date                   Time    __________________________________________ Nurse's Signature    

## 2012-07-19 ENCOUNTER — Ambulatory Visit (HOSPITAL_BASED_OUTPATIENT_CLINIC_OR_DEPARTMENT_OTHER): Payer: Medicare Other

## 2012-07-19 VITALS — BP 132/72 | HR 76 | Temp 97.7°F

## 2012-07-19 DIAGNOSIS — C189 Malignant neoplasm of colon, unspecified: Secondary | ICD-10-CM

## 2012-07-19 DIAGNOSIS — C787 Secondary malignant neoplasm of liver and intrahepatic bile duct: Secondary | ICD-10-CM

## 2012-07-19 MED ORDER — HEPARIN SOD (PORK) LOCK FLUSH 100 UNIT/ML IV SOLN
500.0000 [IU] | Freq: Once | INTRAVENOUS | Status: AC | PRN
  Administered 2012-07-19: 500 [IU]
  Filled 2012-07-19: qty 5

## 2012-07-19 MED ORDER — SODIUM CHLORIDE 0.9 % IJ SOLN
10.0000 mL | INTRAMUSCULAR | Status: DC | PRN
  Administered 2012-07-19: 10 mL
  Filled 2012-07-19: qty 10

## 2012-07-19 MED ORDER — PEGFILGRASTIM INJECTION 6 MG/0.6ML
6.0000 mg | Freq: Once | SUBCUTANEOUS | Status: AC
  Administered 2012-07-19: 6 mg via SUBCUTANEOUS
  Filled 2012-07-19: qty 0.6

## 2012-07-28 ENCOUNTER — Ambulatory Visit (HOSPITAL_COMMUNITY)
Admission: RE | Admit: 2012-07-28 | Discharge: 2012-07-28 | Disposition: A | Discharge status: Home / Self Care | Payer: Medicare Other | Source: Ambulatory Visit | Attending: Nurse Practitioner | Admitting: Nurse Practitioner

## 2012-07-28 ENCOUNTER — Other Ambulatory Visit: Payer: Self-pay | Admitting: *Deleted

## 2012-07-28 DIAGNOSIS — C189 Malignant neoplasm of colon, unspecified: Secondary | ICD-10-CM | POA: Insufficient documentation

## 2012-07-28 DIAGNOSIS — K439 Ventral hernia without obstruction or gangrene: Secondary | ICD-10-CM | POA: Insufficient documentation

## 2012-07-28 DIAGNOSIS — K429 Umbilical hernia without obstruction or gangrene: Secondary | ICD-10-CM | POA: Insufficient documentation

## 2012-07-28 DIAGNOSIS — Q619 Cystic kidney disease, unspecified: Secondary | ICD-10-CM | POA: Insufficient documentation

## 2012-07-28 DIAGNOSIS — Z9089 Acquired absence of other organs: Secondary | ICD-10-CM | POA: Insufficient documentation

## 2012-07-28 DIAGNOSIS — K7689 Other specified diseases of liver: Secondary | ICD-10-CM | POA: Insufficient documentation

## 2012-07-28 DIAGNOSIS — M47817 Spondylosis without myelopathy or radiculopathy, lumbosacral region: Secondary | ICD-10-CM | POA: Insufficient documentation

## 2012-07-28 MED ORDER — IOHEXOL 300 MG/ML  SOLN
100.0000 mL | Freq: Once | INTRAMUSCULAR | Status: AC | PRN
  Administered 2012-07-28: 100 mL via INTRAVENOUS

## 2012-07-31 ENCOUNTER — Other Ambulatory Visit: Payer: Self-pay | Admitting: Oncology

## 2012-08-01 ENCOUNTER — Telehealth: Payer: Self-pay | Admitting: *Deleted

## 2012-08-01 ENCOUNTER — Ambulatory Visit (HOSPITAL_BASED_OUTPATIENT_CLINIC_OR_DEPARTMENT_OTHER): Payer: Medicare Other

## 2012-08-01 ENCOUNTER — Ambulatory Visit (HOSPITAL_BASED_OUTPATIENT_CLINIC_OR_DEPARTMENT_OTHER): Payer: Medicare Other | Admitting: Oncology

## 2012-08-01 ENCOUNTER — Other Ambulatory Visit (HOSPITAL_BASED_OUTPATIENT_CLINIC_OR_DEPARTMENT_OTHER): Payer: Medicare Other | Admitting: Lab

## 2012-08-01 VITALS — BP 136/71 | HR 79 | Temp 96.9°F | Resp 18 | Ht 61.0 in | Wt 208.7 lb

## 2012-08-01 DIAGNOSIS — R197 Diarrhea, unspecified: Secondary | ICD-10-CM

## 2012-08-01 DIAGNOSIS — C787 Secondary malignant neoplasm of liver and intrahepatic bile duct: Secondary | ICD-10-CM

## 2012-08-01 DIAGNOSIS — C189 Malignant neoplasm of colon, unspecified: Secondary | ICD-10-CM

## 2012-08-01 DIAGNOSIS — Z5111 Encounter for antineoplastic chemotherapy: Secondary | ICD-10-CM

## 2012-08-01 DIAGNOSIS — Z5112 Encounter for antineoplastic immunotherapy: Secondary | ICD-10-CM

## 2012-08-01 DIAGNOSIS — D702 Other drug-induced agranulocytosis: Secondary | ICD-10-CM

## 2012-08-01 LAB — COMPREHENSIVE METABOLIC PANEL (CC13)
BUN: 18 mg/dL (ref 7.0–26.0)
CO2: 19 mEq/L — ABNORMAL LOW (ref 22–29)
Creatinine: 1.5 mg/dL — ABNORMAL HIGH (ref 0.6–1.1)
Glucose: 174 mg/dl — ABNORMAL HIGH (ref 70–99)
Total Bilirubin: 0.5 mg/dL (ref 0.20–1.20)
Total Protein: 6.5 g/dL (ref 6.4–8.3)

## 2012-08-01 LAB — CBC WITH DIFFERENTIAL/PLATELET
Basophils Absolute: 0.1 10*3/uL (ref 0.0–0.1)
Eosinophils Absolute: 0.1 10*3/uL (ref 0.0–0.5)
HGB: 9.6 g/dL — ABNORMAL LOW (ref 11.6–15.9)
LYMPH%: 18.2 % (ref 14.0–49.7)
MCV: 96.3 fL (ref 79.5–101.0)
MONO%: 6.3 % (ref 0.0–14.0)
NEUT#: 9.9 10*3/uL — ABNORMAL HIGH (ref 1.5–6.5)
NEUT%: 74.4 % (ref 38.4–76.8)
Platelets: 158 10*3/uL (ref 145–400)

## 2012-08-01 MED ORDER — LEUCOVORIN CALCIUM INJECTION 350 MG
285.0000 mg/m2 | Freq: Once | INTRAVENOUS | Status: AC
  Administered 2012-08-01: 572 mg via INTRAVENOUS
  Filled 2012-08-01: qty 28.6

## 2012-08-01 MED ORDER — ONDANSETRON 16 MG/50ML IVPB (CHCC)
16.0000 mg | Freq: Once | INTRAVENOUS | Status: AC
  Administered 2012-08-01: 16 mg via INTRAVENOUS

## 2012-08-01 MED ORDER — ATROPINE SULFATE 1 MG/ML IJ SOLN
0.5000 mg | Freq: Once | INTRAMUSCULAR | Status: AC | PRN
  Administered 2012-08-01: 0.5 mg via INTRAVENOUS

## 2012-08-01 MED ORDER — FLUOROURACIL CHEMO INJECTION 500 MG/10ML
234.0000 mg/m2 | Freq: Once | INTRAVENOUS | Status: AC
  Administered 2012-08-01: 450 mg via INTRAVENOUS
  Filled 2012-08-01: qty 9

## 2012-08-01 MED ORDER — SODIUM CHLORIDE 0.9 % IV SOLN
5.0000 mg/kg | Freq: Once | INTRAVENOUS | Status: AC
  Administered 2012-08-01: 475 mg via INTRAVENOUS
  Filled 2012-08-01: qty 19

## 2012-08-01 MED ORDER — SODIUM CHLORIDE 0.9 % IV SOLN
Freq: Once | INTRAVENOUS | Status: AC
  Administered 2012-08-01: 14:00:00 via INTRAVENOUS

## 2012-08-01 MED ORDER — IRINOTECAN HCL CHEMO INJECTION 100 MG/5ML
140.0000 mg/m2 | Freq: Once | INTRAVENOUS | Status: AC
  Administered 2012-08-01: 282 mg via INTRAVENOUS
  Filled 2012-08-01: qty 14.1

## 2012-08-01 MED ORDER — DEXAMETHASONE SODIUM PHOSPHATE 4 MG/ML IJ SOLN
20.0000 mg | Freq: Once | INTRAMUSCULAR | Status: AC
  Administered 2012-08-01: 20 mg via INTRAVENOUS

## 2012-08-01 MED ORDER — SODIUM CHLORIDE 0.9 % IV SOLN
1500.0000 mg/m2 | INTRAVENOUS | Status: DC
  Administered 2012-08-01: 3000 mg via INTRAVENOUS
  Filled 2012-08-01: qty 60

## 2012-08-01 NOTE — Telephone Encounter (Signed)
Received call from pt daughter Josslin Sanjuan, requesting Dr. Truett Perna to call her regarding today's visit 08/01/12. Stated "brother is not a good resource for information."  Note given to Dr. Truett Perna.

## 2012-08-01 NOTE — Patient Instructions (Signed)
[facility] Discharge Instructions for Patients Receiving Chemotherapy  Today you received the following chemotherapy agents Camptosar, Leucovorin, 29fu , Avastin To help prevent nausea and vomiting after your treatment, we encourage you to take your nausea medication as directed by MD. If you develop nausea and vomiting that is not controlled by your nausea medication, call the clinic. If it is after clinic hours your family physician or the after hours number for the clinic or go to the Emergency Department.   BELOW ARE SYMPTOMS THAT SHOULD BE REPORTED IMMEDIATELY:  *FEVER GREATER THAN 100.5 F  *CHILLS WITH OR WITHOUT FEVER  NAUSEA AND VOMITING THAT IS NOT CONTROLLED WITH YOUR NAUSEA MEDICATION  *UNUSUAL SHORTNESS OF BREATH  *UNUSUAL BRUISING OR BLEEDING  TENDERNESS IN MOUTH AND THROAT WITH OR WITHOUT PRESENCE OF ULCERS  *URINARY PROBLEMS  *BOWEL PROBLEMS  UNUSUAL RASH Items with * indicate a potential emergency and should be followed up as soon as possible.  One of the nurses will contact you 24 hours after your treatment. Please let the nurse know about any problems that you may have experienced. Feel free to call the clinic you have any questions or concerns. The clinic phone number is [phone].   I have been informed and understand all the instructions given to me. I know to contact the clinic, my physician, or go to the Emergency Department if any problems should occur. I do not have any questions at this time, but understand that I may call the clinic during office hours   should I have any questions or need assistance in obtaining follow up care.    __________________________________________  _____________  __________ Signature of Patient or Authorized Representative            Date                   Time    __________________________________________ Nurse's Signature

## 2012-08-01 NOTE — Progress Notes (Signed)
[facility]    OFFICE PROGRESS NOTE   INTERVAL HISTORY:   She returns as scheduled. She completed another cycle of FOLFIRI/Avastin on 07/17/2012. She reports developing diffuse pain 4-5 days after the chemotherapy. This lasted for 2 days and included chest, back and extremity pain. No nausea or diarrhea. The mouth was sore following chemotherapy. Her chief complaint is malaise.  Objective:  Vital signs in last 24 hours:  Blood pressure 136/71, pulse 79, temperature 96.9 F (36.1 C), temperature source Oral, resp. rate 18, height 5\' 1"  (1.549 m), weight 208 lb 11.2 oz (94.666 kg).    HEENT: Hyperpigmentation, no thrush or ulcers Resp: Lungs clear bilaterally Cardio: Regular rate and rhythm GI: No hepatosplenomegaly, nontender Vascular: The left lower leg is slightly larger than the right side, no edema  Skin: Hyperpigmentation at the palms   Portacath/PICC-without erythema  Lab Results:  Lab Results  Component Value Date   WBC 13.3* 08/01/2012   HGB 9.6* 08/01/2012   HCT 29.1* 08/01/2012   MCV 96.3 08/01/2012   PLT 158 08/01/2012   ANC 9.9  X-rays: A CT of the abdomen on 07/28/2012 was compared to a CT from 05/08/2012-the pre-measurable liver lesions appear smaller. No new hepatic metastases.    Medications: I have reviewed the patient's current medications.  Assessment/Plan: 1. Metastatic colon cancer presenting with synchronous colon primaries in September, 2008, status post a hemicolectomy and liver biopsy 08/03/2007. A CT on 04/20/2010 revealed no evidence metastatic disease. She was maintained on 5-FU/leucovorin per the FOLFOX regimen beginning in April, 2009. Avasta was discontinued from the chemotherapy regimen beginning in November, 2009 due to nephrotic range proteinuria. A CT of the abdomen and pelvis 02/05/2011 revealed evidence of three small liver lesions. An MRI of the abdomen on 03/02/2011 confirmed 3 liver lesions and no additional evidence of  metastatic disease. K-RAS testing on the transverse colon and left colon tumors revealed a wild type genotype.  1A. Staging PET scan 03/21/2011 confirmed 3 hypermetabolic liver lesions and no additional evidence of metastatic disease.  1B. Surgical consultation by Dr. Donell Beers was obtained and Ms. Preusser decided against an attempt at surgical resection/radiofrequency ablation.  1C. Salvage therapy with FOLFOX was initiated 04/13/2011.  1D. A restaging CT 06/28/2011 revealed a significant improvement in the hepatic metastasis.  1E. She completed eight cycles of FOLFOX chemotherapy. Treatment was switched to 5FU-leucovorin as per the FOLFOX regimen beginning with cycle #9 due to an allergic reaction (skin rash) following cycle #8.  42F. Restaging CT 10/25/2011 revealed 2 more conspicuous liver metastases .  1G. Restaging PET scan 12/24/2011 revealed an increase in the size and metabolic activity of 3 liver metastases  1H. She began FOLFIRI chemotherapy on 01/19/2012. 1 I. Restaging CT of the abdomen 05/08/2012 revealed a mixed response with enlargement of 2 liver lesions and a decrease in the size of a third lesion, no new lesions.  1J. FOLFIRI chemotherapy continued with the addition of Avastin beginning 05/09/2012.  1K. restaging CT 07/28/2012 with no new liver lesions and a decreased size of the previously noted 3 liver lesions 1. Diabetes. 2. History of proteinuria secondary to Avastin versus diabetes. 3. Mucositis and hand foot syndrome, secondary to 5FU improved with a dose reduction of the 5-FU. 4. Anemia secondary to chemotherapy. Stable. 5. Left back and left leg pain with an MRI of the lumbar spine confirming lumbar disk disease. She is status post surgery by Dr. Franky Macho 02/14/2008 with resolution of the back and left leg  pain. 6. Rosacea. She is followed by dermatology. 7. Depression, maintained on Cymbalta-she requested a referral to Dr. Noe Gens 8. History of bilateral knee pain secondary to  arthritis -- she takes Vicodin as needed. 9. History of anorexia -- weight loss -- ?Related to depression/anxiety or metastatic colon cancer. 10. Allergic reaction to oxaliplatin with FOLFOX on 04/13/2011. She was able to tolerate FOLFOX on 04/20/2011 with steroid premedication and a prolonged oxaliplatin infusion. After cycle #8 she developed a rash upon returning home with pruritus. She took Benadryl with resolution of symptoms. This reaction occurred despite steroid premedication. 11. Poor dentition -- she has been evaluated by Dr. Dalene Carrow. She has elected to delay recommended tooth extractions. 12. Neutropenia following cycle 1 of FOLFIRI. The FOLFIRI was dose reduced beginning with cycle 2. She was neutropenic 03/14/2012 and chemotherapy was held for one week. She she will received Neulasta support with the most recent cycle of chemotherapy. 13. Nausea and diarrhea during the irinotecan infusion on 03/21/2012. Symptoms resolved with atropine. She receives atropine as a premedication with each cycle. 14. Diarrhea following the 05/09/2012 chemotherapy cycle. Likely related to irinotecan. She takes Imodium as needed. 15. Diffuse pain 4-5 days after the last cycle of chemotherapy-likely toxicity from Neulasta   Disposition:  Her overall status is stable. The restaging CT on 07/28/2012 confirms improvement in the 3 liver metastases and no new lesions. I discussed treatment options with Emily Lawson and her son. The plan is to continue FOLFIRI/Avastin for now. Her case will be presented at the GI tumor conference on 08/09/2012. We will ask interventional radiology and surgery to consider local treatment options. Emily Lawson will return for an office visit and chemotherapy in 2 weeks.   Thornton Papas, MD  08/01/2012  12:25 PM

## 2012-08-01 NOTE — Patient Instructions (Signed)
Emily Lawson 1935/12/14 [id number]  [facility] Discharge Instructions  Your exam findings, labs and results were discussed with your MD today.  Filed Vitals:   08/01/12 1105  BP: 136/71  Pulse: 79  Temp: 96.9 F (36.1 C)  Resp: 18   Current outpatient prescriptions:acetaminophen (TYLENOL) 500 MG tablet, Take 500 mg by mouth every 6 (six) hours as needed.  , Disp: , Rfl: ;  alendronate (FOSAMAX) 70 MG tablet, Take 70 mg by mouth Once a week. Will begin 03/14/12, Disp: , Rfl: ;  amLODipine (NORVASC) 5 MG tablet, 1  Tab daily, Disp: , Rfl: ;  aspirin 81 MG tablet, Take 81 mg by mouth 2 (two) times daily. , Disp: , Rfl:  Cholecalciferol (VITAMIN D-3 PO), Take by mouth. 5 000 units daily , Disp: , Rfl: ;  CYMBALTA 60 MG capsule, 1 tab daily, Disp: , Rfl: ;  Diphenhyd-Hydrocort-Nystatin (FIRST-DUKES MOUTHWASH) SUSP, Use as directed 10 mLs in the mouth or throat 4 (four) times daily as needed (swish and spit prn mouth sores)., Disp: 500 mL, Rfl: 1;  glipiZIDE (GLUCOTROL XL) 10 MG 24 hr tablet, 1 tab daily, Disp: , Rfl:  glucosamine-chondroitin 500-400 MG tablet, Take 1 tablet by mouth 2 (two) times daily., Disp: , Rfl: ;  hydrochlorothiazide (HYDRODIURIL) 25 MG tablet, 1 tab daily, Disp: , Rfl: ;  HYDROcodone-acetaminophen (VICODIN) 5-500 MG per tablet, As needed, Disp: , Rfl: ;  lidocaine-prilocaine (EMLA) cream, Apply topically as needed., Disp: 30 g, Rfl: 0;  lisinopril (PRINIVIL,ZESTRIL) 20 MG tablet, 1 tab dailly, Disp: , Rfl:  metFORMIN (GLUCOPHAGE) 1000 MG tablet, 1 tab twice a ady, Disp: , Rfl: ;  pantoprazole (PROTONIX) 40 MG tablet, 1 tab daily, Disp: , Rfl: ;  pravastatin (PRAVACHOL) 40 MG tablet, 1 tab daily, Disp: , Rfl: ;  traMADol (ULTRAM) 50 MG tablet, Take 50 mg by mouth every 6 (six) hours as needed.  , Disp: , Rfl: ;  naproxen sodium (ANAPROX) 220 MG tablet, Take 220 mg by mouth 2 (two) times daily with a meal., Disp: , Rfl:  No current facility-administered medications  for this visit. Facility-Administered Medications Ordered in Other Visits: fluorouracil (ADRUCIL) 3,000 mg in sodium chloride 0.9 % 150 mL chemo infusion, 1,500 mg/m2 (Treatment Plan Actual), Intravenous, 1 day or 1 dose, Ladene Artist, MD, 3,000 mg at 07/04/12 1542  Please visit scheduling to obtain calendar for future appointments.  Please call the [facility] at [phone] during business hours should you have any further questions or need assistance in obtaining follow-up care. If you have a medical emergency, please dial 911.  Special Instructions:

## 2012-08-02 ENCOUNTER — Telehealth: Payer: Self-pay | Admitting: Oncology

## 2012-08-02 ENCOUNTER — Telehealth: Payer: Self-pay | Admitting: *Deleted

## 2012-08-02 NOTE — Telephone Encounter (Signed)
Per staff message and POF I have scheduled appts.  JMW  

## 2012-08-02 NOTE — Telephone Encounter (Signed)
lmonvm adviisng the pt to pick up her sept 2013 appt schedule when she comes in the office

## 2012-08-03 ENCOUNTER — Ambulatory Visit (HOSPITAL_BASED_OUTPATIENT_CLINIC_OR_DEPARTMENT_OTHER): Payer: Medicare Other

## 2012-08-03 DIAGNOSIS — C189 Malignant neoplasm of colon, unspecified: Secondary | ICD-10-CM

## 2012-08-03 MED ORDER — HEPARIN SOD (PORK) LOCK FLUSH 100 UNIT/ML IV SOLN
500.0000 [IU] | Freq: Once | INTRAVENOUS | Status: AC
  Administered 2012-08-03: 500 [IU] via INTRAVENOUS
  Filled 2012-08-03: qty 5

## 2012-08-03 MED ORDER — SODIUM CHLORIDE 0.9 % IJ SOLN
10.0000 mL | INTRAMUSCULAR | Status: DC | PRN
  Administered 2012-08-03: 10 mL via INTRAVENOUS
  Filled 2012-08-03: qty 10

## 2012-08-03 MED ORDER — PEGFILGRASTIM INJECTION 6 MG/0.6ML: 6.0000 mg | Freq: Once | SUBCUTANEOUS | Status: DC

## 2012-08-05 ENCOUNTER — Emergency Department (HOSPITAL_BASED_OUTPATIENT_CLINIC_OR_DEPARTMENT_OTHER): Payer: Medicare Other

## 2012-08-05 ENCOUNTER — Encounter (HOSPITAL_BASED_OUTPATIENT_CLINIC_OR_DEPARTMENT_OTHER): Payer: Self-pay | Admitting: *Deleted

## 2012-08-05 ENCOUNTER — Emergency Department (HOSPITAL_BASED_OUTPATIENT_CLINIC_OR_DEPARTMENT_OTHER)
Admission: EM | Admit: 2012-08-05 | Discharge: 2012-08-05 | Disposition: A | Discharge status: Home / Self Care | Payer: Medicare Other | Attending: Emergency Medicine | Admitting: Emergency Medicine

## 2012-08-05 DIAGNOSIS — S0081XA Abrasion of other part of head, initial encounter: Secondary | ICD-10-CM

## 2012-08-05 DIAGNOSIS — IMO0002 Reserved for concepts with insufficient information to code with codable children: Secondary | ICD-10-CM | POA: Insufficient documentation

## 2012-08-05 DIAGNOSIS — W010XXA Fall on same level from slipping, tripping and stumbling without subsequent striking against object, initial encounter: Secondary | ICD-10-CM | POA: Insufficient documentation

## 2012-08-05 DIAGNOSIS — Z79899 Other long term (current) drug therapy: Secondary | ICD-10-CM | POA: Insufficient documentation

## 2012-08-05 DIAGNOSIS — E119 Type 2 diabetes mellitus without complications: Secondary | ICD-10-CM | POA: Insufficient documentation

## 2012-08-05 DIAGNOSIS — H5789 Other specified disorders of eye and adnexa: Secondary | ICD-10-CM | POA: Insufficient documentation

## 2012-08-05 DIAGNOSIS — C189 Malignant neoplasm of colon, unspecified: Secondary | ICD-10-CM | POA: Insufficient documentation

## 2012-08-05 DIAGNOSIS — I10 Essential (primary) hypertension: Secondary | ICD-10-CM | POA: Insufficient documentation

## 2012-08-05 DIAGNOSIS — S0083XA Contusion of other part of head, initial encounter: Secondary | ICD-10-CM

## 2012-08-05 DIAGNOSIS — S0003XA Contusion of scalp, initial encounter: Secondary | ICD-10-CM | POA: Insufficient documentation

## 2012-08-05 DIAGNOSIS — S62319A Displaced fracture of base of unspecified metacarpal bone, initial encounter for closed fracture: Secondary | ICD-10-CM | POA: Insufficient documentation

## 2012-08-05 MED ORDER — HYDROCODONE-ACETAMINOPHEN 5-325 MG PO TABS: 1.0000 | ORAL_TABLET | ORAL | Status: AC | PRN

## 2012-08-05 MED ORDER — TETANUS-DIPHTH-ACELL PERTUSSIS 5-2.5-18.5 LF-MCG/0.5 IM SUSP: 0.5000 mL | Freq: Once | INTRAMUSCULAR | Status: DC

## 2012-08-05 NOTE — ED Provider Notes (Signed)
History   This chart was scribed for Hanley Seamen, MD scribed by Magnus Sinning. The patient was seen in room MH07/MH07 at 19:07   CSN: [id number]  Arrival date & time 08/05/12  1754      Chief Complaint  Patient presents with  . Fall    (Consider location/radiation/quality/duration/timing/severity/associated sxs/prior treatment) HPI Emily Lawson is a 76 y.o. female who presents to the Emergency Department for EVAL of a fall that occurred two days ago. Patient has complaints of moderate bruising to right side of her face with associated constant moderate pain to right arm, and right hand with swelling at the 4th and 5th fingers. Patient explains she tripped and fell outside a store parking lot, causing her to scrap her entire right side of arm,hand and face. She says she went to [facility] urgent Care, but relative states they sent her over to the ED for her to have a head CT scan. Patient also notes that she had a nosebleed as a result of the fall, but denies LOC or any other complaints.    Past Medical History  Diagnosis Date  . Metastases to the liver dx'd 2008    chemo ongoing  . Hypertension   . Arthritis   . Heart murmur   . Chills   . Hearing loss   . Leg swelling   . Nausea   . Weakness   . Nausea   . Colon cancer dx'd 2008    hemicolectomy, liver bx 08/03/07  . Cancer 02/05/11 CT abd/pelvis    colon primary met to liver  . Diabetes mellitus   . Anemia     2ndary to chemo  . Rosacea     hx of  . Anorexia     hx of  . Anxiety     Past Surgical History  Procedure Date  . Llaparoscopic cholecystectomy   . Colon surgery 2008  . Back surgery 2009  . Cholecystectomy 1997  . Inguinal hernia repair     as child  . Tonsillectomy     as child    Family History  Problem Relation Age of Onset  . Cancer Mother     ovarian  . Heart disease Father   . Diabetes Brother     History  Substance Use Topics  . Smoking status: Never Smoker   . Smokeless tobacco: Never  Used  . Alcohol Use: No   Review of Systems 10 Systems reviewed and are negative for acute change except as noted in the HPI. Allergies  Avandia and Oxaliplatin  Home Medications   Current Outpatient Rx  Name Route Sig Dispense Refill  . ACETAMINOPHEN 500 MG PO TABS Oral Take 500 mg by mouth every 6 (six) hours as needed.      . ALENDRONATE SODIUM 70 MG PO TABS Oral Take 70 mg by mouth Once a week. Will begin 03/14/12      Has not started yet  . AMLODIPINE BESYLATE 5 MG PO TABS  1  Tab daily    . AMLODIPINE BESYLATE 5 MG PO TABS  1 tablet Daily.    . ASPIRIN 81 MG PO TABS Oral Take 81 mg by mouth 2 (two) times daily.     Marland Kitchen VITAMIN D-3 PO Oral Take by mouth. 5 000 units daily     . CYMBALTA 60 MG PO CPEP  1 tab daily    . FIRST-DUKES MOUTHWASH MT SUSP Mouth/Throat Use as directed 10 mLs in the mouth or  throat 4 (four) times daily as needed (swish and spit prn mouth sores). 500 mL 1  . GLIPIZIDE ER 10 MG PO TB24  1 tab daily    . GLUCOSAMINE-CHONDROITIN 500-400 MG PO TABS Oral Take 1 tablet by mouth 2 (two) times daily.    Marland Kitchen HYDROCHLOROTHIAZIDE 25 MG PO TABS  1 tab daily    . HYDROCODONE-ACETAMINOPHEN 5-500 MG PO TABS  As needed    . LIDOCAINE-PRILOCAINE 2.5-2.5 % EX CREA Topical Apply topically as needed. 30 g 0  . LISINOPRIL 20 MG PO TABS  1 tab dailly    . METFORMIN HCL 1000 MG PO TABS  1 tab twice a ady    . NAPROXEN SODIUM 220 MG PO TABS Oral Take 220 mg by mouth 2 (two) times daily with a meal.    . PANTOPRAZOLE SODIUM 40 MG PO TBEC  1 tab daily    . PRAVASTATIN SODIUM 40 MG PO TABS  1 tab daily    . TRAMADOL HCL 50 MG PO TABS Oral Take 50 mg by mouth every 6 (six) hours as needed.        BP 148/71  Pulse 100  Temp 97.5 F (36.4 C) (Oral)  Resp 20  Ht 5' (1.524 m)  Wt 214 lb (97.07 kg)  BMI 41.79 kg/m2  SpO2 99%  Physical Exam  Nursing note and vitals reviewed. Constitutional: She is oriented to person, place, and time. She appears well-developed and well-nourished.  No distress.  HENT:  Head: Normocephalic and atraumatic.       No hemotympanum. Cerumen noted in right ear. Edentual lateral to right upper incisor   Eyes: Conjunctivae and EOM are normal.       Right periorbital hematoma that is tender. No step off or crepitus noted.  Neck: Neck supple. No tracheal deviation present.  Cardiovascular: Normal rate.   Pulmonary/Chest: Effort normal. No respiratory distress.  Abdominal: Soft. She exhibits no distension.  Musculoskeletal: Normal range of motion. She exhibits tenderness. She exhibits no edema.       Pain on ROM, but no significant decrease with ROM. Ecchymosis and tender over the ulnar aspect of the right hand. No snuff box tenderness.  Neurological: She is alert and oriented to person, place, and time. No sensory deficit.  Skin: Skin is warm and dry.       Abrasion to right nose and right upper lip.  Abrasion noted both above and below the right eye.   Psychiatric: She has a normal mood and affect. Her behavior is normal.    ED Course  Procedures (including critical care time) DIAGNOSTIC STUDIES: Oxygen Saturation is 99% on room air, normal by my interpretation.    COORDINATION OF CARE:  19:07: Physical exam performed.      MDM   Nursing notes and vitals signs, including pulse oximetry, reviewed.  Summary of this visit's results, reviewed by myself:  Labs:  Results for orders placed in visit on 08/01/12  CBC WITH DIFFERENTIAL      Component Value Range   WBC 13.3 (*) 3.9 - 10.3 10e3/uL   NEUT# 9.9 (*) 1.5 - 6.5 10e3/uL   HGB 9.6 (*) 11.6 - 15.9 g/dL   HCT 16.1 (*) 09.6 - 04.5 %   Platelets 158  145 - 400 10e3/uL   MCV 96.3  79.5 - 101.0 fL   MCH 31.7  25.1 - 34.0 pg   MCHC 32.9  31.5 - 36.0 g/dL   RBC 4.09 (*) 8.11 -  5.45 10e6/uL   RDW 15.9 (*) 11.2 - 14.5 %   lymph# 2.4  0.9 - 3.3 10e3/uL   MONO# 0.8  0.1 - 0.9 10e3/uL   Eosinophils Absolute 0.1  0.0 - 0.5 10e3/uL   Basophils Absolute 0.1  0.0 - 0.1 10e3/uL   NEUT%  74.4  38.4 - 76.8 %   LYMPH% 18.2  14.0 - 49.7 %   MONO% 6.3  0.0 - 14.0 %   EOS% 0.7  0.0 - 7.0 %   BASO% 0.4  0.0 - 2.0 %  UA PROTEIN, DIPSTICK - [facility]      Component Value Range   Protein, ur Negative  Negative- <30 mg/dL  COMPREHENSIVE METABOLIC PANEL (CC13)      Component Value Range   Sodium 137  136 - 145 mEq/L   Potassium 5.0  3.5 - 5.1 mEq/L   Chloride 106  98 - 107 mEq/L   CO2 19 (*) 22 - 29 mEq/L   Glucose 174 (*) 70 - 99 mg/dl   BUN 21.3  7.0 - 08.6 mg/dL   Creatinine 1.5 (*) 0.6 - 1.1 mg/dL   Total Bilirubin 5.78  0.20 - 1.20 mg/dL   Alkaline Phosphatase 118  40 - 150 U/L   AST 15  5 - 34 U/L   ALT 12  0 - 55 U/L   Total Protein 6.5  6.4 - 8.3 g/dL   Albumin 3.3 (*) 3.5 - 5.0 g/dL  CORRECTED CALCIUM (IO96)      Component Value Range   Calcium, Corrected 10.7 (*) 8.4 - 10.4 mg/dL    Imaging Studies: Dg Shoulder Right  08/05/2012  *RADIOLOGY REPORT*  Clinical Data: 76 year old female status post fall with pain.  RIGHT SHOULDER - 2+ VIEW  Comparison: None.  Findings: No glenohumeral joint dislocation.  Proximal right humerus appears intact.  Right clavicle and scapula appear intact. Partially visible left chest central line.  Right ribs and lung parenchyma appear within normal limits.  IMPRESSION: No acute fracture or dislocation identified about the right shoulder.   Original Report Authenticated By: Harley Hallmark, M.D.    Ct Head Wo Contrast  08/05/2012  *RADIOLOGY REPORT*  Clinical Data:  76 year old female status post fall.  Pain. Metastatic colon cancer.  CT HEAD WITHOUT CONTRAST CT MAXILLOFACIAL WITHOUT CONTRAST  Technique:  Multidetector CT imaging of the head and maxillofacial structures were performed using the standard protocol without intravenous contrast. Multiplanar CT image reconstructions of the maxillofacial structures were also generated.  Comparison:  PET CT 12/24/2011.  CT HEAD  Findings: Face findings are below.  Mastoids are clear.  Hyperostosis of the  calvarium.  No calvarial fracture or suspicious osseous lesion. Visualized scalp soft tissues are within normal limits.  Volume loss probably congruent with age.  No ventriculomegaly. No midline shift, mass effect, or evidence of mass lesion.  No acute intracranial hemorrhage identified.  No evidence of cortically based acute infarction identified.  Minimal or mild for age cerebral white matter hypodensity. Calcified atherosclerosis at the skull base.  No suspicious intracranial vascular hyperdensity.  IMPRESSION: 1.  Negative for age noncontrast CT appearance of the brain. 2.  Face findings are below. 3. Early metastatic disease to the brain cannot be excluded in the absence of intravenous contrast.  CT MAXILLOFACIAL  Findings:   Right periorbital soft tissue swelling.  Right globe intact.  Bilateral intraorbital soft tissues appear normal. Parapharyngeal and other deep soft tissue spaces of the face appear within normal limits.  Partially retropharyngeal course of the carotid arteries incidentally noted.  Visualized paranasal sinuses and mastoids are clear.  Mandible appears intact.  Multi focal peri apical dental lucency, especially right maxillary incisors.  Previous route canal to most of these levels.  No definite acute nasal bone fracture.  No acute facial fracture identified.  Degenerative changes partially visible in the cervical spine.  IMPRESSION: Soft tissue injury without acute facial fracture identified.   Original Report Authenticated By: Harley Hallmark, M.D.    Dg Hand Complete Right  08/05/2012  *RADIOLOGY REPORT*  Clinical Data: 76 year old female status post fall with pain.  RIGHT HAND - COMPLETE 3+ VIEW  Comparison: None.  Findings: Minimally or mildly comminuted and displaced fracture through the base of the right fifth metacarpal.  Slight ulnar displacement of the distal fragment.  Distal radius and ulna appear intact.  Carpal bone alignment within normal limits.  Joint spaces preserved.  No  other acute fracture identified.  IMPRESSION: Minimally or mildly displaced fracture through the base of the right fifth metacarpal.   Original Report Authenticated By: Harley Hallmark, M.D.    Ct Maxillofacial Wo Cm  08/05/2012  *RADIOLOGY REPORT*  Clinical Data:  76 year old female status post fall.  Pain. Metastatic colon cancer.  CT HEAD WITHOUT CONTRAST CT MAXILLOFACIAL WITHOUT CONTRAST  Technique:  Multidetector CT imaging of the head and maxillofacial structures were performed using the standard protocol without intravenous contrast. Multiplanar CT image reconstructions of the maxillofacial structures were also generated.  Comparison:  PET CT 12/24/2011.  CT HEAD  Findings: Face findings are below.  Mastoids are clear.  Hyperostosis of the calvarium.  No calvarial fracture or suspicious osseous lesion. Visualized scalp soft tissues are within normal limits.  Volume loss probably congruent with age.  No ventriculomegaly. No midline shift, mass effect, or evidence of mass lesion.  No acute intracranial hemorrhage identified.  No evidence of cortically based acute infarction identified.  Minimal or mild for age cerebral white matter hypodensity. Calcified atherosclerosis at the skull base.  No suspicious intracranial vascular hyperdensity.  IMPRESSION: 1.  Negative for age noncontrast CT appearance of the brain. 2.  Face findings are below. 3. Early metastatic disease to the brain cannot be excluded in the absence of intravenous contrast.  CT MAXILLOFACIAL  Findings:   Right periorbital soft tissue swelling.  Right globe intact.  Bilateral intraorbital soft tissues appear normal. Parapharyngeal and other deep soft tissue spaces of the face appear within normal limits.  Partially retropharyngeal course of the carotid arteries incidentally noted.  Visualized paranasal sinuses and mastoids are clear.  Mandible appears intact.  Multi focal peri apical dental lucency, especially right maxillary incisors.  Previous  route canal to most of these levels.  No definite acute nasal bone fracture.  No acute facial fracture identified.  Degenerative changes partially visible in the cervical spine.  IMPRESSION: Soft tissue injury without acute facial fracture identified.   Original Report Authenticated By: Harley Hallmark, M.D.      I personally performed the services described in this documentation, which was scribed in my presence.  The recorded information has been reviewed and considered.         Hanley Seamen, MD 08/05/12 2117

## 2012-08-05 NOTE — ED Notes (Signed)
Pt states she fell Thursday and was seen at the [facility] earlier. Sent here. Bruising to right side of face. C/O pain to same and right arm pain.

## 2012-08-05 NOTE — ED Notes (Signed)
Pt d/c with daughter to drive- rx x 1 given for hydrocodone at d/c- pt requesting copy of medical record to give to [facility]- pt directed to unit secretary at d/c to obtain record

## 2012-08-13 ENCOUNTER — Other Ambulatory Visit: Payer: Self-pay | Admitting: Oncology

## 2012-08-16 ENCOUNTER — Other Ambulatory Visit (HOSPITAL_BASED_OUTPATIENT_CLINIC_OR_DEPARTMENT_OTHER): Payer: Medicare Other | Admitting: Lab

## 2012-08-16 ENCOUNTER — Telehealth: Payer: Self-pay | Admitting: *Deleted

## 2012-08-16 ENCOUNTER — Ambulatory Visit (HOSPITAL_BASED_OUTPATIENT_CLINIC_OR_DEPARTMENT_OTHER): Payer: Medicare Other | Admitting: Nurse Practitioner

## 2012-08-16 ENCOUNTER — Ambulatory Visit: Payer: Medicare Other

## 2012-08-16 ENCOUNTER — Telehealth: Payer: Self-pay | Admitting: Oncology

## 2012-08-16 VITALS — BP 152/78 | HR 93 | Temp 97.0°F | Resp 20 | Ht 60.0 in | Wt 203.5 lb

## 2012-08-16 DIAGNOSIS — D709 Neutropenia, unspecified: Secondary | ICD-10-CM

## 2012-08-16 DIAGNOSIS — T451X5A Adverse effect of antineoplastic and immunosuppressive drugs, initial encounter: Secondary | ICD-10-CM

## 2012-08-16 DIAGNOSIS — C189 Malignant neoplasm of colon, unspecified: Secondary | ICD-10-CM

## 2012-08-16 DIAGNOSIS — C186 Malignant neoplasm of descending colon: Secondary | ICD-10-CM

## 2012-08-16 DIAGNOSIS — C787 Secondary malignant neoplasm of liver and intrahepatic bile duct: Secondary | ICD-10-CM

## 2012-08-16 LAB — CBC WITH DIFFERENTIAL/PLATELET
Eosinophils Absolute: 0.2 10*3/uL (ref 0.0–0.5)
HCT: 30.4 % — ABNORMAL LOW (ref 34.8–46.6)
LYMPH%: 57.8 % — ABNORMAL HIGH (ref 14.0–49.7)
MONO#: 0.6 10*3/uL (ref 0.1–0.9)
NEUT#: 0.8 10*3/uL — ABNORMAL LOW (ref 1.5–6.5)
Platelets: 325 10*3/uL (ref 145–400)
RBC: 3.2 10*6/uL — ABNORMAL LOW (ref 3.70–5.45)
WBC: 3.8 10*3/uL — ABNORMAL LOW (ref 3.9–10.3)
nRBC: 1 % — ABNORMAL HIGH (ref 0–0)

## 2012-08-16 NOTE — Telephone Encounter (Signed)
Per staff message and POF I have scheduled appts.  JMW  

## 2012-08-16 NOTE — Telephone Encounter (Signed)
PRINTED AND MADE APPT FOR PT.the patient aware tx will be added.

## 2012-08-16 NOTE — Progress Notes (Signed)
OFFICE PROGRESS NOTE  Interval history:  Emily Lawson returns as scheduled. She was last treated with FOLFIRI/Avastin on 08/01/2012. She had nausea/vomiting and diarrhea relieved, the chemotherapy. She continues to have intermittent loose stools. She had no loose stools yesterday. No mouth sores. She denies bleeding. Stable dyspnea on exertion.   Emily Lawson reports falling outside of a store recently. Brain CT was negative. Maxillofacial CT showed soft tissue injury without acute facial fracture. Right shoulder x-ray showed no acute fracture or dislocation. Right hand x-ray showed minimally or mildly displaced fracture through the base of the right fifth metacarpal. She is wearing a brace on the right hand. Her daughter reports no surgery is planned.   Objective: Blood pressure 152/78, pulse 93, temperature 97 F (36.1 C), temperature source Oral, resp. rate 20, height 5' (1.524 m), weight 203 lb 8 oz (92.307 kg).  Area of resolving ecchymosis at the right lateral forehead region. Oropharynx without thrush. Lungs clear. Regular cardiac rhythm. Port-A-Cath site without erythema. Abdomen soft and nontender. No organomegaly. Extremities without edema.  Lab Results: Lab Results  Component Value Date   WBC 3.8* 08/16/2012   HGB 9.9* 08/16/2012   HCT 30.4* 08/16/2012   MCV 95.0 08/16/2012   PLT 325 08/16/2012    Chemistry:    Chemistry      Component Value Date/Time   NA 137 08/01/2012 1044   NA 135 07/17/2012 1058   K 5.0 08/01/2012 1044   K 4.7 07/17/2012 1058   CL 106 08/01/2012 1044   CL 103 07/17/2012 1058   CO2 19* 08/01/2012 1044   CO2 18* 07/17/2012 1058   BUN 18.0 08/01/2012 1044   BUN 21 07/17/2012 1058   CREATININE 1.5* 08/01/2012 1044   CREATININE 1.26* 07/17/2012 1058   CREATININE 1.29* 05/20/2009 1423   GLU 102* 10/19/2007 1433      Component Value Date/Time   CALCIUM 8.7 07/17/2012 1058   ALKPHOS 118 08/01/2012 1044   ALKPHOS 85 07/17/2012 1058   AST 15 08/01/2012 1044   AST 17 07/17/2012 1058     ALT 12 08/01/2012 1044   ALT 13 07/17/2012 1058   BILITOT 0.50 08/01/2012 1044   BILITOT 0.4 07/17/2012 1058       Studies/Results: Dg Shoulder Right  08/05/2012  *RADIOLOGY REPORT*  Clinical Data: 76 year old female status post fall with pain.  RIGHT SHOULDER - 2+ VIEW  Comparison: None.  Findings: No glenohumeral joint dislocation.  Proximal right humerus appears intact.  Right clavicle and scapula appear intact. Partially visible left chest central line.  Right ribs and lung parenchyma appear within normal limits.  IMPRESSION: No acute fracture or dislocation identified about the right shoulder.   Original Report Authenticated By: Harley Hallmark, M.D.    Ct Head Wo Contrast  08/05/2012  *RADIOLOGY REPORT*  Clinical Data:  76 year old female status post fall.  Pain. Metastatic colon cancer.  CT HEAD WITHOUT CONTRAST CT MAXILLOFACIAL WITHOUT CONTRAST  Technique:  Multidetector CT imaging of the head and maxillofacial structures were performed using the standard protocol without intravenous contrast. Multiplanar CT image reconstructions of the maxillofacial structures were also generated.  Comparison:  PET CT 12/24/2011.  CT HEAD  Findings: Face findings are below.  Mastoids are clear.  Hyperostosis of the calvarium.  No calvarial fracture or suspicious osseous lesion. Visualized scalp soft tissues are within normal limits.  Volume loss probably congruent with age.  No ventriculomegaly. No midline shift, mass effect, or evidence of mass lesion.  No acute intracranial  hemorrhage identified.  No evidence of cortically based acute infarction identified.  Minimal or mild for age cerebral white matter hypodensity. Calcified atherosclerosis at the skull base.  No suspicious intracranial vascular hyperdensity.  IMPRESSION: 1.  Negative for age noncontrast CT appearance of the brain. 2.  Face findings are below. 3. Early metastatic disease to the brain cannot be excluded in the absence of intravenous contrast.  CT  MAXILLOFACIAL  Findings:   Right periorbital soft tissue swelling.  Right globe intact.  Bilateral intraorbital soft tissues appear normal. Parapharyngeal and other deep soft tissue spaces of the face appear within normal limits.  Partially retropharyngeal course of the carotid arteries incidentally noted.  Visualized paranasal sinuses and mastoids are clear.  Mandible appears intact.  Multi focal peri apical dental lucency, especially right maxillary incisors.  Previous route canal to most of these levels.  No definite acute nasal bone fracture.  No acute facial fracture identified.  Degenerative changes partially visible in the cervical spine.  IMPRESSION: Soft tissue injury without acute facial fracture identified.   Original Report Authenticated By: Harley Hallmark, M.D.    Ct Abdomen W Contrast  07/28/2012  *RADIOLOGY REPORT*  Clinical Data: History of colon cancer.  Follow up liver lesions. Ongoing chemotherapy.  CT ABDOMEN WITH CONTRAST  Technique:  Multidetector CT imaging of the abdomen was performed following the standard protocol during bolus administration of intravenous contrast.  Contrast: OMNIPAQUE IOHEXOL 300 MG/ML  SOLN  Comparison: 05/08/2012.  Findings: Lung bases show scattered areas of dependent atelectasis.  Central right hepatic lobe lesion previously measured 43 mm x 30 mm today measures 40 mm x 22 mm.  Inferior right hepatic lobe lesion that previously measured 42 mm x 44 mm today measures 31 mm x 28 mm.  Small adjacent lesion also appears smaller and is more ill defined on today's exam, measuring about 27 mm x 16 mm.  No new hepatic metastatic lesions are identified.  Spleen appears normal.  No upper abdominal adenopathy.  Cholecystectomy. Uncomplicated duodenal diverticulum.  Stable bilateral renal cysts. Diastasis of the rectus muscles with a tiny fat containing ventral hernia.  Visualized colon appears within normal limits.  Descending colectomy.  Lumbar spondylosis.  No  aggressive osseous lesions. Fat containing umbilical hernia again noted.  IMPRESSION: 1.  Interval decrease in size of the three presumably metastatic liver lesions.  Positive response to therapy. 2.  Unchanged ventral and umbilical hernia. 3.  Cholecystectomy and renal cysts. 4.  No evidence of new metastatic disease.   Original Report Authenticated By: Andreas Newport, M.D.    Dg Hand Complete Right  08/05/2012  *RADIOLOGY REPORT*  Clinical Data: 76 year old female status post fall with pain.  RIGHT HAND - COMPLETE 3+ VIEW  Comparison: None.  Findings: Minimally or mildly comminuted and displaced fracture through the base of the right fifth metacarpal.  Slight ulnar displacement of the distal fragment.  Distal radius and ulna appear intact.  Carpal bone alignment within normal limits.  Joint spaces preserved.  No other acute fracture identified.  IMPRESSION: Minimally or mildly displaced fracture through the base of the right fifth metacarpal.   Original Report Authenticated By: Harley Hallmark, M.D.    Ct Maxillofacial Wo Cm  08/05/2012  *RADIOLOGY REPORT*  Clinical Data:  76 year old female status post fall.  Pain. Metastatic colon cancer.  CT HEAD WITHOUT CONTRAST CT MAXILLOFACIAL WITHOUT CONTRAST  Technique:  Multidetector CT imaging of the head and maxillofacial structures were performed using the standard protocol  without intravenous contrast. Multiplanar CT image reconstructions of the maxillofacial structures were also generated.  Comparison:  PET CT 12/24/2011.  CT HEAD  Findings: Face findings are below.  Mastoids are clear.  Hyperostosis of the calvarium.  No calvarial fracture or suspicious osseous lesion. Visualized scalp soft tissues are within normal limits.  Volume loss probably congruent with age.  No ventriculomegaly. No midline shift, mass effect, or evidence of mass lesion.  No acute intracranial hemorrhage identified.  No evidence of cortically based acute infarction identified.  Minimal or  mild for age cerebral white matter hypodensity. Calcified atherosclerosis at the skull base.  No suspicious intracranial vascular hyperdensity.  IMPRESSION: 1.  Negative for age noncontrast CT appearance of the brain. 2.  Face findings are below. 3. Early metastatic disease to the brain cannot be excluded in the absence of intravenous contrast.  CT MAXILLOFACIAL  Findings:   Right periorbital soft tissue swelling.  Right globe intact.  Bilateral intraorbital soft tissues appear normal. Parapharyngeal and other deep soft tissue spaces of the face appear within normal limits.  Partially retropharyngeal course of the carotid arteries incidentally noted.  Visualized paranasal sinuses and mastoids are clear.  Mandible appears intact.  Multi focal peri apical dental lucency, especially right maxillary incisors.  Previous route canal to most of these levels.  No definite acute nasal bone fracture.  No acute facial fracture identified.  Degenerative changes partially visible in the cervical spine.  IMPRESSION: Soft tissue injury without acute facial fracture identified.   Original Report Authenticated By: Harley Hallmark, M.D.     Medications: I have reviewed the patient's current medications.  Assessment/Plan:  1. Metastatic colon cancer presenting with synchronous colon primaries in September, 2008, status post a hemicolectomy and liver biopsy 08/03/2007. A CT on 04/20/2010 revealed no evidence metastatic disease. She was maintained on 5-FU/leucovorin per the FOLFOX regimen beginning in April, 2009. Avasta was discontinued from the chemotherapy regimen beginning in November, 2009 due to nephrotic range proteinuria. A CT of the abdomen and pelvis 02/05/2011 revealed evidence of three small liver lesions. An MRI of the abdomen on 03/02/2011 confirmed 3 liver lesions and no additional evidence of metastatic disease. K-RAS testing on the transverse colon and left colon tumors revealed a wild type genotype.  1A. Staging  PET scan 03/21/2011 confirmed 3 hypermetabolic liver lesions and no additional evidence of metastatic disease.  1B. Surgical consultation by Dr. Donell Beers was obtained and Emily Lawson decided against an attempt at surgical resection/radiofrequency ablation.  1C. Salvage therapy with FOLFOX was initiated 04/13/2011.  1D. A restaging CT 06/28/2011 revealed a significant improvement in the hepatic metastasis.  1E. She completed eight cycles of FOLFOX chemotherapy. Treatment was switched to 5FU-leucovorin as per the FOLFOX regimen beginning with cycle #9 due to an allergic reaction (skin rash) following cycle #8.  92F. Restaging CT 10/25/2011 revealed 2 more conspicuous liver metastases .  1G. Restaging PET scan 12/24/2011 revealed an increase in the size and metabolic activity of 3 liver metastases  1H. She began FOLFIRI chemotherapy on 01/19/2012.  1 I. Restaging CT of the abdomen 05/08/2012 revealed a mixed response with enlargement of 2 liver lesions and a decrease in the size of a third lesion, no new lesions.  1J. FOLFIRI chemotherapy continued with the addition of Avastin beginning 05/09/2012.  1K. restaging CT 07/28/2012 with no new liver lesions and a decreased size of the previously noted 3 liver lesions  1. Diabetes. 2. History of proteinuria secondary to Avastin versus diabetes.  3. Mucositis and hand foot syndrome, secondary to 5FU improved with a dose reduction of the 5-FU. 4. Anemia secondary to chemotherapy. Stable. 5. Left back and left leg pain with an MRI of the lumbar spine confirming lumbar disk disease. She is status post surgery by Dr. Franky Macho 02/14/2008 with resolution of the back and left leg pain. 6. Rosacea. She is followed by dermatology. 7. Depression, maintained on Cymbalta-she requested a referral to Dr. Noe Gens 8. History of bilateral knee pain secondary to arthritis -- she takes Vicodin as needed. 9. History of anorexia -- weight loss -- ?Related to depression/anxiety or  metastatic colon cancer. 10. Allergic reaction to oxaliplatin with FOLFOX on 04/13/2011. She was able to tolerate FOLFOX on 04/20/2011 with steroid premedication and a prolonged oxaliplatin infusion. After cycle #8 she developed a rash upon returning home with pruritus. She took Benadryl with resolution of symptoms. This reaction occurred despite steroid premedication. 11. Poor dentition -- she has been evaluated by Dr. Dalene Carrow. She has elected to delay recommended tooth extractions. 12. Neutropenia following cycle 1 of FOLFIRI. The FOLFIRI was dose reduced beginning with cycle 2. She was neutropenic 03/14/2012 and chemotherapy was held for one week.  13. Nausea and diarrhea during the irinotecan infusion on 03/21/2012. Symptoms resolved with atropine. She receives atropine as a premedication with each cycle. 14. Diarrhea following the 05/09/2012 chemotherapy cycle. Likely related to irinotecan. She takes Imodium as needed. 15. Diffuse pain 4-5 days after chemotherapy 07/17/2012-likely toxicity from Neulasta. 16. Recent fall with fracture of the right fifth metacarpal.  Disposition-Emily Lawson appears stable. She is neutropenic on labs today. We will hold today's treatment and reschedule for one week. The treatment schedule will be adjusted from every 2 weeks to every 3 weeks thereafter. She will return for a followup visit and chemotherapy on 09/12/2012. She will contact the office in the interim with any problems.  Plan reviewed with Dr. Truett Perna.  Lonna Cobb ANP/GNP-BC

## 2012-08-21 ENCOUNTER — Telehealth: Payer: Self-pay | Admitting: Oncology

## 2012-08-21 NOTE — Telephone Encounter (Signed)
lmonvm of dr Noe Gens regarding this pt needing an appt to see her as soon as she is aviailable.

## 2012-08-22 ENCOUNTER — Other Ambulatory Visit (HOSPITAL_BASED_OUTPATIENT_CLINIC_OR_DEPARTMENT_OTHER): Payer: Medicare Other | Admitting: Lab

## 2012-08-22 ENCOUNTER — Other Ambulatory Visit: Payer: Self-pay | Admitting: Internal Medicine

## 2012-08-22 ENCOUNTER — Other Ambulatory Visit: Payer: Self-pay | Admitting: Oncology

## 2012-08-22 ENCOUNTER — Ambulatory Visit (HOSPITAL_BASED_OUTPATIENT_CLINIC_OR_DEPARTMENT_OTHER): Payer: Medicare Other

## 2012-08-22 VITALS — BP 151/63 | HR 65 | Temp 96.9°F | Resp 20

## 2012-08-22 DIAGNOSIS — C787 Secondary malignant neoplasm of liver and intrahepatic bile duct: Secondary | ICD-10-CM

## 2012-08-22 DIAGNOSIS — C801 Malignant (primary) neoplasm, unspecified: Secondary | ICD-10-CM

## 2012-08-22 DIAGNOSIS — Z5111 Encounter for antineoplastic chemotherapy: Secondary | ICD-10-CM

## 2012-08-22 DIAGNOSIS — R5383 Other fatigue: Secondary | ICD-10-CM

## 2012-08-22 DIAGNOSIS — C189 Malignant neoplasm of colon, unspecified: Secondary | ICD-10-CM

## 2012-08-22 DIAGNOSIS — Z5112 Encounter for antineoplastic immunotherapy: Secondary | ICD-10-CM

## 2012-08-22 LAB — URINALYSIS, MICROSCOPIC - CHCC
Bilirubin (Urine): NEGATIVE
Glucose: NEGATIVE g/dL
Ketones: NEGATIVE mg/dL

## 2012-08-22 LAB — CBC WITH DIFFERENTIAL/PLATELET
Basophils Absolute: 0.1 10*3/uL (ref 0.0–0.1)
Eosinophils Absolute: 0.5 10*3/uL (ref 0.0–0.5)
HGB: 9.6 g/dL — ABNORMAL LOW (ref 11.6–15.9)
MCV: 96.5 fL (ref 79.5–101.0)
MONO#: 0.7 10*3/uL (ref 0.1–0.9)
NEUT#: 6.3 10*3/uL (ref 1.5–6.5)
RDW: 17.6 % — ABNORMAL HIGH (ref 11.2–14.5)
WBC: 9.4 10*3/uL (ref 3.9–10.3)
lymph#: 1.8 10*3/uL (ref 0.9–3.3)

## 2012-08-22 LAB — COMPREHENSIVE METABOLIC PANEL (CC13)
Albumin: 3.1 g/dL — ABNORMAL LOW (ref 3.5–5.0)
CO2: 18 mEq/L — ABNORMAL LOW (ref 22–29)
Chloride: 105 mEq/L (ref 98–107)
Glucose: 199 mg/dl — ABNORMAL HIGH (ref 70–99)
Potassium: 4.2 mEq/L (ref 3.5–5.1)
Sodium: 138 mEq/L (ref 136–145)
Total Protein: 6.5 g/dL (ref 6.4–8.3)

## 2012-08-22 LAB — UA PROTEIN, DIPSTICK - CHCC: Protein, ur: 300 mg/dL

## 2012-08-22 MED ORDER — DEXAMETHASONE SODIUM PHOSPHATE 4 MG/ML IJ SOLN
20.0000 mg | Freq: Once | INTRAMUSCULAR | Status: AC
  Administered 2012-08-22: 20 mg via INTRAVENOUS

## 2012-08-22 MED ORDER — LEUCOVORIN CALCIUM INJECTION 350 MG
285.0000 mg/m2 | Freq: Once | INTRAVENOUS | Status: AC
  Administered 2012-08-22: 572 mg via INTRAVENOUS
  Filled 2012-08-22: qty 28.6

## 2012-08-22 MED ORDER — SODIUM CHLORIDE 0.9 % IV SOLN
5.0000 mg/kg | Freq: Once | INTRAVENOUS | Status: AC
  Administered 2012-08-22: 475 mg via INTRAVENOUS
  Filled 2012-08-22: qty 19

## 2012-08-22 MED ORDER — ONDANSETRON 16 MG/50ML IVPB (CHCC)
16.0000 mg | Freq: Once | INTRAVENOUS | Status: AC
  Administered 2012-08-22: 16 mg via INTRAVENOUS

## 2012-08-22 MED ORDER — FLUOROURACIL CHEMO INJECTION 5 GM/100ML
1500.0000 mg/m2 | INTRAVENOUS | Status: DC
  Administered 2012-08-22: 3000 mg via INTRAVENOUS
  Filled 2012-08-22: qty 60

## 2012-08-22 MED ORDER — PROMETHAZINE HCL 25 MG/ML IJ SOLN
12.5000 mg | Freq: Once | INTRAMUSCULAR | Status: AC
  Administered 2012-08-22: 12.5 mg via INTRAVENOUS
  Filled 2012-08-22: qty 1

## 2012-08-22 MED ORDER — FLUOROURACIL CHEMO INJECTION 500 MG/10ML
234.0000 mg/m2 | Freq: Once | INTRAVENOUS | Status: AC
  Administered 2012-08-22: 450 mg via INTRAVENOUS
  Filled 2012-08-22: qty 9

## 2012-08-22 MED ORDER — SODIUM CHLORIDE 0.9 % IV SOLN
Freq: Once | INTRAVENOUS | Status: AC
  Administered 2012-08-22: 12:00:00 via INTRAVENOUS

## 2012-08-22 MED ORDER — IRINOTECAN HCL CHEMO INJECTION 100 MG/5ML
140.0000 mg/m2 | Freq: Once | INTRAVENOUS | Status: AC
  Administered 2012-08-22: 282 mg via INTRAVENOUS
  Filled 2012-08-22: qty 14.1

## 2012-08-22 NOTE — Progress Notes (Signed)
Pt vomiting episode was at 1400.  Recheck after Phenergan at 1430, she states nausea has resolved-dhp, rn 1515-Pt c/o frequent urination including overnight which is not usual for her.  She has been to the restroom multiple times during her chemo treatment today which she also states is not normal.  She did have one episode of urinary incontinence.  Lonna Cobb, NP aware.  Per Misty Stanley, will culture urine specimen.  Pt aware, advised her to call if any new urinary symptoms arise.  She verbalized understanding-dhp, rn

## 2012-08-22 NOTE — Patient Instructions (Signed)
[facility] Discharge Instructions for Patients Receiving Chemotherapy  Today you received the following chemotherapy agents Avastin, Leucovorin, Irinotecan and 5FU  To help prevent nausea and vomiting after your treatment, we encourage you to take your nausea medication as prescribed.   If you develop nausea and vomiting that is not controlled by your nausea medication, call the clinic. If it is after clinic hours your family physician or the after hours number for the clinic or go to the Emergency Department.   BELOW ARE SYMPTOMS THAT SHOULD BE REPORTED IMMEDIATELY:  *FEVER GREATER THAN 100.5 F  *CHILLS WITH OR WITHOUT FEVER  NAUSEA AND VOMITING THAT IS NOT CONTROLLED WITH YOUR NAUSEA MEDICATION  *UNUSUAL SHORTNESS OF BREATH  *UNUSUAL BRUISING OR BLEEDING  TENDERNESS IN MOUTH AND THROAT WITH OR WITHOUT PRESENCE OF ULCERS  *URINARY PROBLEMS  *BOWEL PROBLEMS  UNUSUAL RASH Items with * indicate a potential emergency and should be followed up as soon as possible.  One of the nurses will contact you 24 hours after your treatment. Please let the nurse know about any problems that you may have experienced. Feel free to call the clinic you have any questions or concerns. The clinic phone number is [phone].   I have been informed and understand all the instructions given to me. I know to contact the clinic, my physician, or go to the Emergency Department if any problems should occur. I do not have any questions at this time, but understand that I may call the clinic during office hours   should I have any questions or need assistance in obtaining follow up care.    __________________________________________  _____________  __________ Signature of Patient or Authorized Representative            Date                   Time    __________________________________________ Nurse's Signature

## 2012-08-22 NOTE — Progress Notes (Signed)
1230-Per Lonna Cobb, NP/Dr. Truett Perna- will go ahead and treat with Avastin today despite urine protein of 300.  Will get full U/A on urine specimen.  If U/A negative, will get 24 hour urine-dhp, rn

## 2012-08-22 NOTE — Progress Notes (Signed)
Pt began vomiting.  Notified MD.  Received verbal order for IV phenergan 12.5 - Dr. Truett Perna.

## 2012-08-23 ENCOUNTER — Ambulatory Visit: Payer: Medicare Other

## 2012-08-23 ENCOUNTER — Other Ambulatory Visit: Payer: Self-pay | Admitting: Oncology

## 2012-08-24 ENCOUNTER — Ambulatory Visit (HOSPITAL_BASED_OUTPATIENT_CLINIC_OR_DEPARTMENT_OTHER): Payer: Medicare Other

## 2012-08-24 VITALS — BP 113/56 | HR 69 | Temp 98.6°F

## 2012-08-24 DIAGNOSIS — C189 Malignant neoplasm of colon, unspecified: Secondary | ICD-10-CM

## 2012-08-24 DIAGNOSIS — C787 Secondary malignant neoplasm of liver and intrahepatic bile duct: Secondary | ICD-10-CM

## 2012-08-24 MED ORDER — SODIUM CHLORIDE 0.9 % IJ SOLN
10.0000 mL | INTRAMUSCULAR | Status: DC | PRN
  Administered 2012-08-24: 10 mL
  Filled 2012-08-24: qty 10

## 2012-08-24 MED ORDER — PEGFILGRASTIM INJECTION 6 MG/0.6ML
6.0000 mg | Freq: Once | SUBCUTANEOUS | Status: AC
  Administered 2012-08-24: 6 mg via SUBCUTANEOUS
  Filled 2012-08-24: qty 0.6

## 2012-08-24 MED ORDER — HEPARIN SOD (PORK) LOCK FLUSH 100 UNIT/ML IV SOLN
500.0000 [IU] | Freq: Once | INTRAVENOUS | Status: AC | PRN
  Administered 2012-08-24: 500 [IU]
  Filled 2012-08-24: qty 5

## 2012-08-25 ENCOUNTER — Telehealth: Payer: Self-pay | Admitting: *Deleted

## 2012-09-10 ENCOUNTER — Other Ambulatory Visit: Payer: Self-pay | Admitting: Oncology

## 2012-09-12 ENCOUNTER — Ambulatory Visit (HOSPITAL_BASED_OUTPATIENT_CLINIC_OR_DEPARTMENT_OTHER): Payer: Medicare Other

## 2012-09-12 ENCOUNTER — Telehealth: Payer: Self-pay | Admitting: *Deleted

## 2012-09-12 ENCOUNTER — Encounter: Payer: Self-pay | Admitting: *Deleted

## 2012-09-12 ENCOUNTER — Ambulatory Visit (HOSPITAL_BASED_OUTPATIENT_CLINIC_OR_DEPARTMENT_OTHER): Payer: Medicare Other | Admitting: Oncology

## 2012-09-12 ENCOUNTER — Other Ambulatory Visit (HOSPITAL_BASED_OUTPATIENT_CLINIC_OR_DEPARTMENT_OTHER): Payer: Medicare Other | Admitting: Lab

## 2012-09-12 VITALS — BP 146/83 | HR 73 | Temp 96.8°F | Resp 20 | Ht 60.0 in | Wt 209.0 lb

## 2012-09-12 DIAGNOSIS — C787 Secondary malignant neoplasm of liver and intrahepatic bile duct: Secondary | ICD-10-CM

## 2012-09-12 DIAGNOSIS — Z5111 Encounter for antineoplastic chemotherapy: Secondary | ICD-10-CM

## 2012-09-12 DIAGNOSIS — F329 Major depressive disorder, single episode, unspecified: Secondary | ICD-10-CM

## 2012-09-12 DIAGNOSIS — C189 Malignant neoplasm of colon, unspecified: Secondary | ICD-10-CM

## 2012-09-12 LAB — CBC WITH DIFFERENTIAL/PLATELET
BASO%: 0.6 % (ref 0.0–2.0)
EOS%: 2.1 % (ref 0.0–7.0)
HGB: 9.4 g/dL — ABNORMAL LOW (ref 11.6–15.9)
MCH: 30.6 pg (ref 25.1–34.0)
MCHC: 31.2 g/dL — ABNORMAL LOW (ref 31.5–36.0)
RDW: 18 % — ABNORMAL HIGH (ref 11.2–14.5)
lymph#: 2.5 10*3/uL (ref 0.9–3.3)

## 2012-09-12 LAB — COMPREHENSIVE METABOLIC PANEL (CC13)
AST: 11 U/L (ref 5–34)
Albumin: 3.3 g/dL — ABNORMAL LOW (ref 3.5–5.0)
Alkaline Phosphatase: 94 U/L (ref 40–150)
Potassium: 4.8 mEq/L (ref 3.5–5.1)
Sodium: 132 mEq/L — ABNORMAL LOW (ref 136–145)
Total Protein: 6.6 g/dL (ref 6.4–8.3)

## 2012-09-12 MED ORDER — ONDANSETRON 16 MG/50ML IVPB (CHCC)
16.0000 mg | Freq: Once | INTRAVENOUS | Status: AC
  Administered 2012-09-12: 16 mg via INTRAVENOUS

## 2012-09-12 MED ORDER — SODIUM CHLORIDE 0.9 % IV SOLN
Freq: Once | INTRAVENOUS | Status: AC
  Administered 2012-09-12: 13:00:00 via INTRAVENOUS

## 2012-09-12 MED ORDER — ATROPINE SULFATE 1 MG/ML IJ SOLN
0.5000 mg | Freq: Once | INTRAMUSCULAR | Status: AC | PRN
  Administered 2012-09-12: 0.5 mg via INTRAVENOUS

## 2012-09-12 MED ORDER — HEPARIN SOD (PORK) LOCK FLUSH 100 UNIT/ML IV SOLN
250.0000 [IU] | Freq: Once | INTRAVENOUS | Status: DC | PRN
  Filled 2012-09-12: qty 5

## 2012-09-12 MED ORDER — FLUOROURACIL CHEMO INJECTION 500 MG/10ML
234.0000 mg/m2 | Freq: Once | INTRAVENOUS | Status: AC
  Administered 2012-09-12: 450 mg via INTRAVENOUS
  Filled 2012-09-12: qty 9

## 2012-09-12 MED ORDER — ALTEPLASE 2 MG IJ SOLR
2.0000 mg | Freq: Once | INTRAMUSCULAR | Status: DC | PRN
  Filled 2012-09-12: qty 2

## 2012-09-12 MED ORDER — SODIUM CHLORIDE 0.9 % IJ SOLN
10.0000 mL | INTRAMUSCULAR | Status: DC | PRN
  Filled 2012-09-12: qty 10

## 2012-09-12 MED ORDER — SODIUM CHLORIDE 0.9 % IJ SOLN
3.0000 mL | INTRAMUSCULAR | Status: DC | PRN
  Filled 2012-09-12: qty 10

## 2012-09-12 MED ORDER — LEUCOVORIN CALCIUM INJECTION 350 MG
580.0000 mg | Freq: Once | INTRAVENOUS | Status: AC
  Administered 2012-09-12: 580 mg via INTRAVENOUS
  Filled 2012-09-12: qty 29

## 2012-09-12 MED ORDER — SODIUM CHLORIDE 0.9 % IV SOLN
1500.0000 mg/m2 | INTRAVENOUS | Status: DC
  Administered 2012-09-12: 3000 mg via INTRAVENOUS
  Filled 2012-09-12: qty 60

## 2012-09-12 MED ORDER — IRINOTECAN HCL CHEMO INJECTION 100 MG/5ML
280.0000 mg | Freq: Once | INTRAVENOUS | Status: AC
  Administered 2012-09-12: 280 mg via INTRAVENOUS
  Filled 2012-09-12: qty 14

## 2012-09-12 MED ORDER — HEPARIN SOD (PORK) LOCK FLUSH 100 UNIT/ML IV SOLN
500.0000 [IU] | Freq: Once | INTRAVENOUS | Status: DC | PRN
  Filled 2012-09-12: qty 5

## 2012-09-12 MED ORDER — DEXAMETHASONE SODIUM PHOSPHATE 4 MG/ML IJ SOLN
20.0000 mg | Freq: Once | INTRAMUSCULAR | Status: AC
  Administered 2012-09-12: 20 mg via INTRAVENOUS

## 2012-09-12 MED ORDER — SODIUM CHLORIDE 0.9 % IV SOLN
5.0000 mg/kg | Freq: Once | INTRAVENOUS | Status: AC
  Administered 2012-09-12: 475 mg via INTRAVENOUS
  Filled 2012-09-12: qty 19

## 2012-09-12 NOTE — Patient Instructions (Signed)
[facility] Discharge Instructions for Patients Receiving Chemotherapy  Today you received the following chemotherapy agents Avastin, camptosar, leocovorin, 5FU  To help prevent nausea and vomiting after your treatment, we encourage you to take your nausea medication patient reports having an anti-emetic that's yellow in color. Begin taking it at anytime after discharge and take it as often as prescribed for the next 72 hours.   If you develop nausea and vomiting that is not controlled by your nausea medication, call the clinic. If it is after clinic hours your family physician or the after hours number for the clinic or go to the Emergency Department.   BELOW ARE SYMPTOMS THAT SHOULD BE REPORTED IMMEDIATELY:  *FEVER GREATER THAN 100.5 F  *CHILLS WITH OR WITHOUT FEVER  NAUSEA AND VOMITING THAT IS NOT CONTROLLED WITH YOUR NAUSEA MEDICATION  *UNUSUAL SHORTNESS OF BREATH  *UNUSUAL BRUISING OR BLEEDING  TENDERNESS IN MOUTH AND THROAT WITH OR WITHOUT PRESENCE OF ULCERS  *URINARY PROBLEMS  *BOWEL PROBLEMS  UNUSUAL RASH Items with * indicate a potential emergency and should be followed up as soon as possible.  Please call to let a nurse know about any problems that you may have experienced. Feel free to call the clinic you have any questions or concerns. The clinic phone number is [phone].   I have been informed and understand all the instructions given to me. I know to contact the clinic, my physician, or go to the Emergency Department if any problems should occur. I do not have any questions at this time, but understand that I may call the clinic during office hours   should I have any questions or need assistance in obtaining follow up care.    __________________________________________  _____________  __________ Signature of Patient or Authorized Representative            Date                   Time    __________________________________________ Nurse's  Signature

## 2012-09-12 NOTE — Progress Notes (Signed)
Ms. Marksberry assisted to front entrance.  Denies need to call her son.  Daughters at work.  Says she can drive herself home.  Spouse has parkinson's Dz.  Awaiting Valet Parking to bring car to her to drive home.  asked that she call after hours if needed.

## 2012-09-12 NOTE — Progress Notes (Signed)
[facility]    OFFICE PROGRESS NOTE   INTERVAL HISTORY:   She returns as scheduled. She completed another cycle of FOLFIRI/Avastin on 08/22/2012. No significant nausea or diarrhea. She complains of malaise. There is been a recent increase in chronic back pain. No bleeding. The rosacea is worse. She was placed on doxycycline by her dermatologist.  Objective:  Vital signs in last 24 hours:  Blood pressure 146/83, pulse 73, temperature 96.8 F (36 C), temperature source Oral, resp. rate 20, height 5' (1.524 m), weight 209 lb (94.802 kg).    HEENT: No thrush or ulcers Resp: Lungs clear bilaterally Cardio: Regular rate and rhythm GI: No hepatomegaly, no mass, mild tenderness in the right subcostal region Vascular: Trace pretibial edema bilaterally Skin: Erythematous rash over the face with a pustular lesion at the right chin   Portacath/PICC-without erythema  Lab Results:  Lab Results  Component Value Date   WBC 14.5* 09/12/2012   HGB 9.4* 09/12/2012   HCT 30.1* 09/12/2012   MCV 98.0 09/12/2012   PLT 343 09/12/2012   ANC 11.0   Medications: I have reviewed the patient's current medications.  Assessment/Plan: 1. Metastatic colon cancer presenting with synchronous colon primaries in September, 2008, status post a hemicolectomy and liver biopsy 08/03/2007. A CT on 04/20/2010 revealed no evidence metastatic disease. She was maintained on 5-FU/leucovorin per the FOLFOX regimen beginning in April, 2009. Avasta was discontinued from the chemotherapy regimen beginning in November, 2009 due to nephrotic range proteinuria. A CT of the abdomen and pelvis 02/05/2011 revealed evidence of three small liver lesions. An MRI of the abdomen on 03/02/2011 confirmed 3 liver lesions and no additional evidence of metastatic disease. K-RAS testing on the transverse colon and left colon tumors revealed a wild type genotype.  1A. Staging PET scan 03/21/2011 confirmed 3 hypermetabolic  liver lesions and no additional evidence of metastatic disease.  1B. Surgical consultation by Dr. Donell Beers was obtained and Ms. Hua decided against an attempt at surgical resection/radiofrequency ablation.  1C. Salvage therapy with FOLFOX was initiated 04/13/2011.  1D. A restaging CT 06/28/2011 revealed a significant improvement in the hepatic metastasis.  1E. She completed eight cycles of FOLFOX chemotherapy. Treatment was switched to 5FU-leucovorin as per the FOLFOX regimen beginning with cycle #9 due to an allergic reaction (skin rash) following cycle #8.  89F. Restaging CT 10/25/2011 revealed 2 more conspicuous liver metastases .  1G. Restaging PET scan 12/24/2011 revealed an increase in the size and metabolic activity of 3 liver metastases  1H. She began FOLFIRI chemotherapy on 01/19/2012.  1 I. Restaging CT of the abdomen 05/08/2012 revealed a mixed response with enlargement of 2 liver lesions and a decrease in the size of a third lesion, no new lesions.  1J. FOLFIRI chemotherapy continued with the addition of Avastin beginning 05/09/2012.  1K. restaging CT 07/28/2012 with no new liver lesions and a decreased size of the previously noted 3 liver lesions  1. Diabetes. 2. History of proteinuria secondary to Avastin versus diabetes. 3. Mucositis and hand foot syndrome, secondary to 5FU improved with a dose reduction of the 5-FU. 4. Anemia secondary to chemotherapy. Stable. 5. Left back and left leg pain with an MRI of the lumbar spine confirming lumbar disk disease. She is status post surgery by Dr. Franky Macho 02/14/2008 with resolution of the back and left leg pain. 6. Rosacea. She is followed by dermatology. A recent flare, now maintained on doxycycline 7. Depression, maintained on Cymbalta-she requested a referral to Dr.  Peters 8. History of bilateral knee pain secondary to arthritis -- she takes Vicodin as needed. 9. History of anorexia -- weight loss -- ?Related to depression/anxiety or  metastatic colon cancer. 10. Allergic reaction to oxaliplatin with FOLFOX on 04/13/2011. She was able to tolerate FOLFOX on 04/20/2011 with steroid premedication and a prolonged oxaliplatin infusion. After cycle #8 she developed a rash upon returning home with pruritus. She took Benadryl with resolution of symptoms. This reaction occurred despite steroid premedication. 11. Poor dentition -- she has been evaluated by Dr. Dalene Carrow. She has elected to delay recommended tooth extractions. 12. Neutropenia following cycle 1 of FOLFIRI. The FOLFIRI was dose reduced beginning with cycle 2. She was neutropenic 03/14/2012 and chemotherapy was held for one week.  13. Nausea and diarrhea during the irinotecan infusion on 03/21/2012. Symptoms resolved with atropine. She receives atropine as a premedication with each cycle. 14. Diarrhea following the 05/09/2012 chemotherapy cycle. Likely related to irinotecan. She takes Imodium as needed. 15. Diffuse pain 4-5 days after chemotherapy 07/17/2012-likely toxicity from Neulasta. 16. Recent fall with fracture of the right fifth metacarpal.   Disposition:  She appears stable. The plan is to continue FOLFIRI/Avastin on a 3 week schedule. She will return for an office visit and chemotherapy in 3 weeks.  Her case was again discussed at the GI tumor conference several weeks ago. Dr. Mitzi Hansen has reviewed the x-rays and feels she may be a candidate for SRS to the liver lesions. Ms. Schleifer agrees to a consultation with Dr. Mitzi Hansen. We discussed the NSABP MPR tissue bank study today.   Thornton Papas, MD  09/12/2012  2:29 PM

## 2012-09-12 NOTE — Telephone Encounter (Signed)
Per staff message and PODF I have scheduled appts. JMW

## 2012-09-12 NOTE — Progress Notes (Signed)
Today's labs drawn at 1520 from patient's right ante-cubutal with butterfly.

## 2012-09-13 ENCOUNTER — Ambulatory Visit: Payer: Medicare Other

## 2012-09-13 ENCOUNTER — Encounter: Payer: Self-pay | Admitting: *Deleted

## 2012-09-13 ENCOUNTER — Other Ambulatory Visit: Payer: Self-pay | Admitting: *Deleted

## 2012-09-13 ENCOUNTER — Encounter: Payer: Self-pay | Admitting: Radiation Oncology

## 2012-09-13 ENCOUNTER — Ambulatory Visit
Admission: RE | Admit: 2012-09-13 | Discharge: 2012-09-13 | Disposition: A | Discharge status: Home / Self Care | Payer: Medicare Other | Source: Ambulatory Visit | Attending: Radiation Oncology | Admitting: Radiation Oncology

## 2012-09-13 VITALS — BP 121/54 | HR 79 | Temp 97.8°F | Resp 20 | Wt 202.2 lb

## 2012-09-13 DIAGNOSIS — C787 Secondary malignant neoplasm of liver and intrahepatic bile duct: Secondary | ICD-10-CM | POA: Insufficient documentation

## 2012-09-13 DIAGNOSIS — Z79899 Other long term (current) drug therapy: Secondary | ICD-10-CM | POA: Insufficient documentation

## 2012-09-13 DIAGNOSIS — C189 Malignant neoplasm of colon, unspecified: Secondary | ICD-10-CM | POA: Insufficient documentation

## 2012-09-13 DIAGNOSIS — Z7982 Long term (current) use of aspirin: Secondary | ICD-10-CM | POA: Insufficient documentation

## 2012-09-13 DIAGNOSIS — E119 Type 2 diabetes mellitus without complications: Secondary | ICD-10-CM | POA: Insufficient documentation

## 2012-09-13 DIAGNOSIS — Z510 Encounter for antineoplastic radiation therapy: Secondary | ICD-10-CM | POA: Insufficient documentation

## 2012-09-13 NOTE — Progress Notes (Signed)
New Consult Metastatic  Colon Cancer primary,liver BX's= 08/03/07=Adenocarcinoma Folfiri/Avastin on 08/22/12  Recent fall 08/05/12 abrasions face/arm,hands, right periorbital hematoma Liver metastases  Alert,oriented x3, slow steady gait, has left power port, 34fu infusing  Rosacea on face, Married, 4 children, no c/o pain, no bleeding, has mud like stools stated, and incontinence of both bowels and urine,  -3stools today. 2 of them mushy states patient, fatigued, little appetite, no pain 4:00 PM

## 2012-09-13 NOTE — Progress Notes (Signed)
Pt came for chemo infusion pump to be checked.    Per pt,  Chemo pump was dropped early this am  Approx. 0700 am.   Pt called the clinic and was instructed to stop pump and  Clamped tubing, and to come in for pump checked.    Nurse assessed portacath site - huber needle and dressing intact, clean , and dry.   Side port flused easily with normal saline and good blood return.   Pump and chemo bag along with tubing intact. No leaking noted.    Dr. Truett Perna notified of above info.  Per Dr. Truett Perna,  Pt can come in on 09/14/12 at scheduled time for pump disconnected.   Leftover of chemo can be discarded at pump disconnected time.   Explanations given to pt .   Pump was reconnected at same rate at  4.6 ml/hr.   Pt voiced understanding.

## 2012-09-13 NOTE — Progress Notes (Signed)
Please see the Nurse Progress Note in the MD Initial Consult Encounter for this patient. 

## 2012-09-14 ENCOUNTER — Ambulatory Visit (HOSPITAL_BASED_OUTPATIENT_CLINIC_OR_DEPARTMENT_OTHER): Payer: Medicare Other

## 2012-09-14 ENCOUNTER — Other Ambulatory Visit: Payer: Self-pay | Admitting: Certified Registered Nurse Anesthetist

## 2012-09-14 VITALS — BP 130/64 | HR 81 | Temp 97.3°F

## 2012-09-14 DIAGNOSIS — C787 Secondary malignant neoplasm of liver and intrahepatic bile duct: Secondary | ICD-10-CM

## 2012-09-14 DIAGNOSIS — C189 Malignant neoplasm of colon, unspecified: Secondary | ICD-10-CM

## 2012-09-14 MED ORDER — SODIUM CHLORIDE 0.9 % IJ SOLN
10.0000 mL | INTRAMUSCULAR | Status: DC | PRN
  Administered 2012-09-14: 10 mL
  Filled 2012-09-14: qty 10

## 2012-09-14 MED ORDER — HEPARIN SOD (PORK) LOCK FLUSH 100 UNIT/ML IV SOLN
500.0000 [IU] | Freq: Once | INTRAVENOUS | Status: AC | PRN
  Administered 2012-09-14: 500 [IU]
  Filled 2012-09-14: qty 5

## 2012-09-14 MED ORDER — PEGFILGRASTIM INJECTION 6 MG/0.6ML
6.0000 mg | Freq: Once | SUBCUTANEOUS | Status: AC
  Administered 2012-09-14: 6 mg via SUBCUTANEOUS
  Filled 2012-09-14: qty 0.6

## 2012-09-14 NOTE — Addendum Note (Signed)
Encounter addended by: Jonna Coup, MD on: 09/14/2012 11:26 AM<BR>     Documentation filed: Notes Section

## 2012-09-14 NOTE — Progress Notes (Signed)
Radiation Oncology         [phone] ________________________________  Name: Emily Lawson MRN: [id number]  Date: 09/13/2012  DOB: Sep 05, 1936  WU:[id number],JYNWGN ALEXANDER, MD  Ladene Artist, MD     REFERRING PHYSICIAN: Ladene Artist, MD   DIAGNOSIS: The primary encounter diagnosis was Colon cancer. A diagnosis of Secondary malignant neoplasm of liver was also pertinent to this visit.   HISTORY OF PRESENT ILLNESS::Emily Lawson is a 76 y.o. female who is seen for an initial consultation visit. The patient has a diagnosis of metastatic colon cancer. She is initially presented with synchronous colon primary tumors in 2008 and underwent surgical resection. The patient received adjuvant chemotherapy.  Restaging studies in 2012 however revealed evidence of 3 small liver metastases. An MRI scan was performed and this confirmed this finding. There is no evidence of additional metastatic disease seen on PET scan. The patient was evaluated for possible surgical resection or ablation. They decided against this and she proceeded with salvage therapy with FOLFOX beginning in May of 2012. She has been maintained on chemotherapy through Dr. Truett Perna and there has been somewhat of a next response in terms of her liver disease. There is been no development of any known extra liver metastatic disease. The patient's most recent scan of the abdomen on 07/28/2012 showed no new liver lesions. There is a decrease in the size of the previously noted 3 liver lesions. Her most recent regimen has consisted of full Folfiri chemotherapy with Avastin and this was begun after restaging studies in June of this year showed a mixed response with an enlargement of 2 liver lesions.  Patient's case has been discussed at GI conference and I have had a chance to review her most recent imaging. As noted above, the patient has elected not to proceed with resection of the liver metastases or ablation. A course of stereotactic body  radiotherapy represents another alternative and I have been asked to see the patient today for evaluation and discussion of this option.  PREVIOUS RADIATION THERAPY: No   PAST MEDICAL HISTORY:  has a past medical history of Metastases to the liver (dx'd 2008); Hypertension; Arthritis; Heart murmur; Chills; Hearing loss; Leg swelling; Nausea; Weakness; Nausea; Colon cancer (dx'd 2008); Diabetes mellitus; Anemia; Rosacea; Anorexia; Anxiety; Allergy; and Cancer (02/05/11 CT abd/pelvis).     PAST SURGICAL HISTORY: Past Surgical History  Procedure Date  . Llaparoscopic cholecystectomy   . Colon surgery 2008  . Back surgery 2009  . Cholecystectomy 1997  . Inguinal hernia repair     as child  . Tonsillectomy     as child     FAMILY HISTORY: family history includes Cancer in her mother; Diabetes in her brother; and Heart disease in her father.   SOCIAL HISTORY:  reports that she has never smoked. She has never used smokeless tobacco. She reports that she does not drink alcohol or use illicit drugs.   ALLERGIES: Avandia and Oxaliplatin   MEDICATIONS:  Current Outpatient Prescriptions  Medication Sig Dispense Refill  . acetaminophen (TYLENOL) 500 MG tablet Take 500 mg by mouth every 6 (six) hours as needed.        Marland Kitchen alendronate (FOSAMAX) 70 MG tablet Take 70 mg by mouth Once a week. Will begin 03/14/12      . amLODipine (NORVASC) 5 MG tablet Take 1 tablet by mouth Daily.       Marland Kitchen aspirin 81 MG tablet Take 81 mg by mouth 2 (two) times daily.       Marland Kitchen  Cholecalciferol (VITAMIN D-3 PO) Take 5,000 Units by mouth daily.       . CYMBALTA 60 MG capsule Take 60 mg by mouth at bedtime.       . Diphenhyd-Hydrocort-Nystatin (FIRST-DUKES MOUTHWASH) SUSP Use as directed 10 mLs in the mouth or throat 4 (four) times daily as needed (swish and spit prn mouth sores).  500 mL  1  . doxycycline (VIBRAMYCIN) 50 MG capsule Take 50 mg by mouth 2 (two) times daily. Decrease to daily as able      . glipiZIDE  (GLUCOTROL XL) 10 MG 24 hr tablet Take 10 mg by mouth daily.       Marland Kitchen glucosamine-chondroitin 500-400 MG tablet Take 1 tablet by mouth 2 (two) times daily.      . hydrochlorothiazide (HYDRODIURIL) 25 MG tablet Take 25 mg by mouth daily.       Marland Kitchen lidocaine-prilocaine (EMLA) cream Apply topically as needed.  30 g  0  . lisinopril (PRINIVIL,ZESTRIL) 20 MG tablet Take 20 mg by mouth daily.       . metFORMIN (GLUCOPHAGE) 1000 MG tablet Take 1,000 mg by mouth 2 (two) times daily with a meal.       . naproxen sodium (ANAPROX) 220 MG tablet Take 220 mg by mouth 2 (two) times daily with a meal.      . pantoprazole (PROTONIX) 40 MG tablet Take 40 mg by mouth daily.       . pravastatin (PRAVACHOL) 40 MG tablet Take 40 mg by mouth at bedtime.       . traMADol (ULTRAM) 50 MG tablet Take 50 mg by mouth every 6 (six) hours as needed. Has not started taking this medication       No current facility-administered medications for this encounter.   Facility-Administered Medications Ordered in Other Encounters  Medication Dose Route Frequency Provider Last Rate Last Dose  . fluorouracil (ADRUCIL) 3,000 mg in sodium chloride 0.9 % 150 mL chemo infusion  1,500 mg/m2 (Treatment Plan Actual) Intravenous 1 day or 1 dose Ladene Artist, MD   3,000 mg at 07/04/12 1542     REVIEW OF SYSTEMS:  A 15 point review of systems is documented in the electronic medical record. This was obtained by the nursing staff. However, I reviewed this with the patient to discuss relevant findings and make appropriate changes.  Pertinent items are noted in HPI. the patient primarily complains of some fatigue today. Overall she feels that she is done well with chemotherapy although she also feels that this has taken a call on her over time.    PHYSICAL EXAM:  weight is 202 lb 3.2 oz (91.717 kg). Her oral temperature is 97.8 F (36.6 C). Her blood pressure is 121/54 and her pulse is 79. Her respiration is 20.   General: Well-developed, in no  acute distress HEENT: Normocephalic, atraumatic; oral cavity clear Neck: Supple without any lymphadenopathy Cardiovascular: Regular rate and rhythm Respiratory: Clear to auscultation bilaterally GI: Soft, nontender, normal bowel sounds Extremities: No edema present Neuro: No focal deficits   LABORATORY DATA:  Lab Results  Component Value Date   WBC 14.5* 09/12/2012   HGB 9.4* 09/12/2012   HCT 30.1* 09/12/2012   MCV 98.0 09/12/2012   PLT 343 09/12/2012   Lab Results  Component Value Date   NA 132* 09/12/2012   K 4.8 09/12/2012   CL 101 09/12/2012   CO2 19* 09/12/2012   Lab Results  Component Value Date   ALT 10 09/12/2012  AST 11 09/12/2012   ALKPHOS 94 09/12/2012   BILITOT 0.50 09/12/2012      RADIOGRAPHY: No results found.     IMPRESSION: 76 year old female with metastatic colon cancer. Her systemic disease in general has been well controlled with 3 persistent liver metastases. Her most recent scan showed a decrease in the size of these with no additional disease within the liver and no evidence at this time of other distant disease also.   PLAN:  In reviewing the patient's case, I believe that stereotactic body radiotherapy may be a viable option for her. The patient has 3 moderate sized lesions in therefore the planning process will be very helpful to determine exactly what plan we are able to come up with. If she is interested in this treatment, I do believe it is reasonable to proceed with a simulation and treatment planning and this is a case where we could review the results of the treatment planning before her first treatment if there is any question about the dose to normal structures.  Ideally, I like to treat such lesions in 3 fractions. This is often expanded to 5 fractions if necessary. Other alternatives in terms of modifying the regimen would be dose de-escalation, and moderately longer treatment such as a 10 fraction treatment have also been explored with  some success. There is some data that more gentle/lower doses of treatment then the highest doses we give ideally can be effective, and again the planning process will help clarify exactly where we fall in terms of the spectrum. I do believe that there are options to alter the regimen as necessary while still coming up with a plan that is acceptable in most cases.  Discussed all of this with the patient and her daughter. We discussed the rationale of such a treatment in terms of local control. We also discussed in detail the side effects and risks of treatment. For her case, I believe that the liver dose itself would be the parameter which would receive the most focus during the planning process. Liver damage/injury would therefore be the side effect/risk that I would pay attention to the most her the planning process, in addition to other critical structures such as the bowel, stomach, esophagus and kidneys.  The patient indicated an interest in potentially proceeding with such a treatment. We will therefore schedule her for a simulation sometime next week when available. We will coordinate any start time with Dr. Truett Perna as she has been receiving chemotherapy. The patient has expressed an interest in stopping chemotherapy, or taking a break, and it is possible that this treatment which is focusing on the known existing disease may help in this regard. This of course is something that she will further discuss with Dr. Truett Perna and we will coordinate our care with any planned additional chemotherapy.   I spent 60 minutes minutes face to face with the patient and more than 50% of that time was spent in counseling and/or coordination of care.    ________________________________   Radene Gunning, MD, PhD

## 2012-09-15 NOTE — Addendum Note (Signed)
Encounter addended by: Delynn Flavin, RN on: 09/15/2012 12:31 PM<BR>     Documentation filed: Charges VN

## 2012-09-18 ENCOUNTER — Telehealth: Payer: Self-pay | Admitting: *Deleted

## 2012-09-18 NOTE — Telephone Encounter (Signed)
VM from patient reporting diarrhea since "Tuesday or Thursday, I can't remember". Taking Imodium and feels weak.

## 2012-09-18 NOTE — Telephone Encounter (Signed)
Patient thinks her diarrhea started on Wednesday. Having 3-4 liquid stools/day. Was taking Imodium (a few/day) and stopped taking it 2 days ago. Instructed her to take #2 by mouth now, at 6pm, 10 pm and during the night. Call back tomorrow with update on her status. Will provide further directions at that time. Instructed her to eat bland diet, push po fluids.

## 2012-09-19 ENCOUNTER — Telehealth: Payer: Self-pay | Admitting: Oncology

## 2012-09-19 ENCOUNTER — Telehealth: Payer: Self-pay | Admitting: *Deleted

## 2012-09-19 DIAGNOSIS — C189 Malignant neoplasm of colon, unspecified: Secondary | ICD-10-CM

## 2012-09-19 NOTE — Telephone Encounter (Addendum)
Reports her diarrhea has slowed since 10/21- has had #2 loose stools today so far approximately 1 cup volume each. Has taken #2 Imodium so far today. Took #2 qid yesterday as instructed by nurse. Reports feeling weak. Trying to push fluids. Drinking Gatorade now. Had been eating grapes. Told her not to eat raw fruits and vegetables until the diarrhea is resolved. Noted she is not on K+ supplement. Scheduled to be at [facility] on 10/25 for CT in radiation oncology dept. Per Dr. Truett Perna: Continue to push fluids, take Imodium. Will check Bmet on Friday.

## 2012-09-19 NOTE — Telephone Encounter (Signed)
Talked to patient , gave her appt for lab before Radiation on 09/22/12

## 2012-09-20 ENCOUNTER — Telehealth: Payer: Self-pay | Admitting: *Deleted

## 2012-09-20 NOTE — Telephone Encounter (Signed)
Called patient to f/u on status of her diarrhea and po intake. Left message for her to call office.

## 2012-09-20 NOTE — Telephone Encounter (Signed)
Patient reports getting a good night sleep. Drank Ensure this morning. Had one diarrhea stool at 1 pm. Took #2 Imodium. Ate noodle soup for late lunch and so far feeling OK. She will continue to try bland diet and take Imodium for diarrhea. Check labs on Friday.

## 2012-09-22 ENCOUNTER — Other Ambulatory Visit (HOSPITAL_BASED_OUTPATIENT_CLINIC_OR_DEPARTMENT_OTHER): Payer: Medicare Other | Admitting: Lab

## 2012-09-22 ENCOUNTER — Ambulatory Visit
Admission: RE | Admit: 2012-09-22 | Discharge: 2012-09-22 | Disposition: A | Discharge status: Home / Self Care | Payer: Medicare Other | Source: Ambulatory Visit | Attending: Radiation Oncology | Admitting: Radiation Oncology

## 2012-09-22 ENCOUNTER — Ambulatory Visit: Admission: RE | Admit: 2012-09-22 | Payer: Medicare Other | Source: Ambulatory Visit

## 2012-09-22 ENCOUNTER — Ambulatory Visit: Payer: Medicare Other | Admitting: Radiation Oncology

## 2012-09-22 DIAGNOSIS — C189 Malignant neoplasm of colon, unspecified: Secondary | ICD-10-CM

## 2012-09-22 LAB — BASIC METABOLIC PANEL (CC13)
BUN: 45 mg/dL — ABNORMAL HIGH (ref 7.0–26.0)
CO2: 15 mEq/L — ABNORMAL LOW (ref 22–29)
Chloride: 110 mEq/L — ABNORMAL HIGH (ref 98–107)
Creatinine: 1.4 mg/dL — ABNORMAL HIGH (ref 0.6–1.1)
Glucose: 168 mg/dl — ABNORMAL HIGH (ref 70–99)
Potassium: 4.1 mEq/L (ref 3.5–5.1)

## 2012-09-22 NOTE — Progress Notes (Signed)
Patient rescheduled for ct sim next Friday Nov. 1,2013 as bun elevated at 45.Instructed to push po fluids especially water.Labs to be repeated and reviewed prior to accessing porta-cath and ct simulation.Explained to patient and son why appointment had to be rescheduled.To hold metformin the day of simulation but may take glipizide and eat light meal day of procedure.Given verbal and written instructions and both voiced understanding.

## 2012-09-24 ENCOUNTER — Other Ambulatory Visit: Payer: Self-pay | Admitting: Oncology

## 2012-09-24 DIAGNOSIS — C189 Malignant neoplasm of colon, unspecified: Secondary | ICD-10-CM

## 2012-09-25 ENCOUNTER — Encounter: Payer: Self-pay | Admitting: Oncology

## 2012-09-25 ENCOUNTER — Telehealth: Payer: Self-pay | Admitting: *Deleted

## 2012-09-25 NOTE — Progress Notes (Signed)
Waiting on response from [facility] about which drug requires a pa; they didn't put it on the fax.

## 2012-09-25 NOTE — Telephone Encounter (Signed)
[facility] faxed prior authorization.  Request to Managed care.

## 2012-09-28 ENCOUNTER — Other Ambulatory Visit: Payer: Self-pay

## 2012-09-28 ENCOUNTER — Other Ambulatory Visit: Payer: Self-pay | Admitting: Radiation Oncology

## 2012-09-28 DIAGNOSIS — C787 Secondary malignant neoplasm of liver and intrahepatic bile duct: Secondary | ICD-10-CM

## 2012-09-29 ENCOUNTER — Other Ambulatory Visit: Payer: Self-pay | Admitting: *Deleted

## 2012-09-29 ENCOUNTER — Ambulatory Visit
Admission: RE | Admit: 2012-09-29 | Discharge: 2012-09-29 | Disposition: A | Discharge status: Home / Self Care | Payer: Medicare Other | Source: Ambulatory Visit | Attending: Radiation Oncology | Admitting: Radiation Oncology

## 2012-09-29 VITALS — BP 140/61 | HR 71 | Temp 98.6°F | Wt 201.1 lb

## 2012-09-29 DIAGNOSIS — C189 Malignant neoplasm of colon, unspecified: Secondary | ICD-10-CM

## 2012-09-29 DIAGNOSIS — C787 Secondary malignant neoplasm of liver and intrahepatic bile duct: Secondary | ICD-10-CM

## 2012-09-29 LAB — CBC WITH DIFFERENTIAL/PLATELET
BASO%: 0.6 % (ref 0.0–2.0)
HCT: 29.3 % — ABNORMAL LOW (ref 34.8–46.6)
LYMPH%: 13.7 % — ABNORMAL LOW (ref 14.0–49.7)
MCH: 32.1 pg (ref 25.1–34.0)
MCHC: 32.4 g/dL (ref 31.5–36.0)
MCV: 99.2 fL (ref 79.5–101.0)
MONO#: 0.4 10*3/uL (ref 0.1–0.9)
MONO%: 4 % (ref 0.0–14.0)
NEUT%: 78.5 % — ABNORMAL HIGH (ref 38.4–76.8)
Platelets: 258 10*3/uL (ref 145–400)
WBC: 9.9 10*3/uL (ref 3.9–10.3)

## 2012-09-29 LAB — COMPREHENSIVE METABOLIC PANEL (CC13)
ALT: 10 U/L (ref 0–55)
CO2: 21 mEq/L — ABNORMAL LOW (ref 22–29)
Creatinine: 1.1 mg/dL (ref 0.6–1.1)
Total Bilirubin: 0.75 mg/dL (ref 0.20–1.20)

## 2012-09-29 MED ORDER — SODIUM CHLORIDE 0.9 % IJ SOLN
10.0000 mL | Freq: Once | INTRAMUSCULAR | Status: AC
  Administered 2012-09-29: 10 mL via INTRAVENOUS

## 2012-09-29 NOTE — Progress Notes (Signed)
U/A protein and CBC done in error. Order cancelled and labs credited to account.

## 2012-09-29 NOTE — Progress Notes (Signed)
22 g 1 inch needle via right antecubital with good blood return.left chest port-cath flushed with saline and heparin per protocol as port not power port.Patient tolerated well.Patient completed ua to be taken to lab and then ambulated to ct sim.informed therapist to speak with son in lobby regarding appointments as short-term memory limited.BMet with in normal limits.Didnot take metformin since am of 09/28/2012.

## 2012-09-29 NOTE — Progress Notes (Signed)
Patient presents to the clinic following simulation to have right ac 22 gauge IV removed. IV removed. Catheter intact upon removal. Patient tolerated well. Applied an occlusive dressing to the site. Provided patient and her son with a treatment calendar then, reviewed details. Informed patient because of the contrast given today she was to hold off on taking her metformin until her labs had been drawn within the 48 hour window. Requested patient return Monday for labs. Patient refused saying she is coming on Tuesday anyway. Stressed the important of labs but, patient insisted on waiting to return Tuesday. Instructed patient to present for emergency care if blood sugars became elevated. Patient verbalized understanding of all things reviewed.

## 2012-10-01 ENCOUNTER — Other Ambulatory Visit: Payer: Self-pay | Admitting: Oncology

## 2012-10-02 ENCOUNTER — Telehealth: Payer: Self-pay | Admitting: *Deleted

## 2012-10-02 ENCOUNTER — Other Ambulatory Visit (HOSPITAL_BASED_OUTPATIENT_CLINIC_OR_DEPARTMENT_OTHER): Payer: Medicare Other | Admitting: Lab

## 2012-10-02 DIAGNOSIS — C189 Malignant neoplasm of colon, unspecified: Secondary | ICD-10-CM

## 2012-10-02 LAB — COMPREHENSIVE METABOLIC PANEL (CC13)
Albumin: 3.5 g/dL (ref 3.5–5.0)
CO2: 21 mEq/L — ABNORMAL LOW (ref 22–29)
Glucose: 182 mg/dl — ABNORMAL HIGH (ref 70–99)
Potassium: 4.6 mEq/L (ref 3.5–5.1)
Sodium: 137 mEq/L (ref 136–145)
Total Protein: 6.9 g/dL (ref 6.4–8.3)

## 2012-10-02 NOTE — Telephone Encounter (Signed)
Notified patient of Cmet results and OK to resume her Metformin.

## 2012-10-02 NOTE — Telephone Encounter (Signed)
Left VM that patient's lab values came back from 10/02/2012, showed normal kidney function; okay to resume Metformin at this time per Dr. Truett Perna.

## 2012-10-03 ENCOUNTER — Ambulatory Visit: Payer: Medicare Other | Admitting: Nurse Practitioner

## 2012-10-03 ENCOUNTER — Other Ambulatory Visit: Payer: Medicare Other | Admitting: Lab

## 2012-10-03 ENCOUNTER — Ambulatory Visit: Payer: Medicare Other

## 2012-10-05 ENCOUNTER — Ambulatory Visit: Payer: Medicare Other

## 2012-10-10 ENCOUNTER — Encounter: Payer: Self-pay | Admitting: Radiation Oncology

## 2012-10-10 ENCOUNTER — Ambulatory Visit: Payer: Medicare Other

## 2012-10-10 ENCOUNTER — Ambulatory Visit: Admission: RE | Admit: 2012-10-10 | Payer: Medicare Other | Source: Ambulatory Visit | Admitting: Radiation Oncology

## 2012-10-10 ENCOUNTER — Inpatient Hospital Stay: Admission: RE | Admit: 2012-10-10 | Payer: Self-pay | Source: Ambulatory Visit

## 2012-10-10 ENCOUNTER — Ambulatory Visit
Admission: RE | Admit: 2012-10-10 | Discharge: 2012-10-10 | Disposition: A | Discharge status: Home / Self Care | Payer: Medicare Other | Source: Ambulatory Visit | Attending: Radiation Oncology | Admitting: Radiation Oncology

## 2012-10-10 NOTE — Progress Notes (Signed)
  Radiation Oncology         [phone] ________________________________  Name: HERMILA MILLIS MRN: [id number]  Date: 10/10/2012  DOB: 1936-03-03  Stereotactic Body Radiotherapy Treatment Procedure Note  NARRATIVE:  NYSHA KOPLIN was brought to the stereotactic radiation treatment machine and placed supine on the CT couch. The patient was set up for stereotactic body radiotherapy on the body fix pillow.  3D TREATMENT PLANNING AND DOSIMETRY:  The patient's radiation plan was reviewed and approved prior to starting treatment.  It showed 3-dimensional radiation distributions overlaid onto the planning CT.  The [facility] for the target structures as well as the organs at risk were reviewed. The documentation of this is filed in the radiation oncology EMR.  SIMULATION VERIFICATION:  The patient underwent CT imaging on the treatment unit.  These were carefully aligned to document that the ablative radiation dose would cover the target volume and maximally spare the nearby organs at risk according to the planned distribution.  SPECIAL TREATMENT PROCEDURE: [name] received high dose ablative stereotactic body radiotherapy to the planned target volume without unforeseen complications. Treatment was delivered uneventfully to the liver. Cumulative dose to date is 10 Gy of a scheduled 50 Gy The high doses associated with stereotactic body radiotherapy and the significant potential risks require careful treatment set up and patient monitoring constituting a special treatment procedure   STEREOTACTIC TREATMENT MANAGEMENT:  Following delivery, the patient was evaluated clinically. The patient tolerated treatment without significant acute effects, and was discharged to home in stable condition.    PLAN: Continue treatment as planned.  ------------------------------------------------        Maryln Gottron, MD

## 2012-10-12 ENCOUNTER — Encounter: Payer: Self-pay | Admitting: Radiation Oncology

## 2012-10-12 ENCOUNTER — Ambulatory Visit
Admission: RE | Admit: 2012-10-12 | Discharge: 2012-10-12 | Disposition: A | Discharge status: Home / Self Care | Payer: Medicare Other | Source: Ambulatory Visit | Attending: Radiation Oncology | Admitting: Radiation Oncology

## 2012-10-12 ENCOUNTER — Ambulatory Visit: Admission: RE | Admit: 2012-10-12 | Payer: Medicare Other | Source: Ambulatory Visit | Admitting: Radiation Oncology

## 2012-10-13 ENCOUNTER — Ambulatory Visit: Payer: Medicare Other | Admitting: Radiation Oncology

## 2012-10-16 ENCOUNTER — Ambulatory Visit: Payer: Medicare Other | Admitting: Radiation Oncology

## 2012-10-16 ENCOUNTER — Ambulatory Visit
Admission: RE | Admit: 2012-10-16 | Discharge: 2012-10-16 | Disposition: A | Discharge status: Home / Self Care | Payer: Medicare Other | Source: Ambulatory Visit | Attending: Radiation Oncology | Admitting: Radiation Oncology

## 2012-10-16 ENCOUNTER — Ambulatory Visit: Admission: RE | Admit: 2012-10-16 | Payer: Medicare Other | Source: Ambulatory Visit | Admitting: Radiation Oncology

## 2012-10-16 DIAGNOSIS — C787 Secondary malignant neoplasm of liver and intrahepatic bile duct: Secondary | ICD-10-CM

## 2012-10-16 DIAGNOSIS — C801 Malignant (primary) neoplasm, unspecified: Secondary | ICD-10-CM

## 2012-10-18 ENCOUNTER — Ambulatory Visit: Payer: Medicare Other | Admitting: Radiation Oncology

## 2012-10-18 ENCOUNTER — Ambulatory Visit
Admission: RE | Admit: 2012-10-18 | Discharge: 2012-10-18 | Disposition: A | Discharge status: Home / Self Care | Payer: Medicare Other | Source: Ambulatory Visit | Attending: Radiation Oncology | Admitting: Radiation Oncology

## 2012-10-18 ENCOUNTER — Ambulatory Visit: Admission: RE | Admit: 2012-10-18 | Payer: Medicare Other | Source: Ambulatory Visit | Admitting: Radiation Oncology

## 2012-10-18 DIAGNOSIS — C787 Secondary malignant neoplasm of liver and intrahepatic bile duct: Secondary | ICD-10-CM

## 2012-10-18 NOTE — Progress Notes (Signed)
  Radiation Oncology         [phone] ________________________________  Name: Emily Lawson MRN: [id number]  Date: 10/16/2012  DOB: 1936-04-10   Simulation verification note  The patient underwent film verification for the patient's set-up in preparation for stereotactic body radiosurgery. The patient was placed on the treatment unit and a CT scan was performed. These images were then fused with the patient's planning CT scan. The fusion was carefully reviewed in terms of the patient's anatomy as it related to the planning CT scan. The target structures as well as the organs at risk were evaluated on the patient's treatment CT scan. The target and the normal structures were appropriately aligned for treatment. Therefore the patient proceeded with the fraction of stereotactic body radiosurgery.  Fraction: 3  Dose:  30 Gy   ________________________________  Radene Gunning, MD, PhD

## 2012-10-18 NOTE — Progress Notes (Signed)
  Radiation Oncology         [phone]) [phone] ________________________________  Name: Emily Lawson MRN: [id number]  Date: 10/18/2012  DOB: [birth year]   Simulation verification note  The patient underwent film verification for the patient's set-up in preparation for stereotactic body radiosurgery. The patient was placed on the treatment unit and a CT scan was performed. These images were then fused with the patient's planning CT scan. The fusion was carefully reviewed in terms of the patient's anatomy as it related to the planning CT scan. The target structures as well as the organs at risk were evaluated on the patient's treatment CT scan. The target and the normal structures were appropriately aligned for treatment. Therefore the patient proceeded with the fraction of stereotactic body radiosurgery.  Fraction: 4  Dose:  40 Gy   ________________________________  Radene Gunning, MD, PhD

## 2012-10-20 ENCOUNTER — Encounter: Payer: Self-pay | Admitting: Radiation Oncology

## 2012-10-20 ENCOUNTER — Ambulatory Visit: Payer: Medicare Other | Admitting: Radiation Oncology

## 2012-10-20 ENCOUNTER — Ambulatory Visit
Admission: RE | Admit: 2012-10-20 | Discharge: 2012-10-20 | Disposition: A | Discharge status: Home / Self Care | Payer: Medicare Other | Source: Ambulatory Visit | Attending: Radiation Oncology | Admitting: Radiation Oncology

## 2012-10-20 ENCOUNTER — Ambulatory Visit: Admission: RE | Admit: 2012-10-20 | Payer: Medicare Other | Source: Ambulatory Visit | Admitting: Radiation Oncology

## 2012-10-20 VITALS — BP 146/69 | HR 84 | Temp 98.7°F | Wt 198.7 lb

## 2012-10-20 DIAGNOSIS — C787 Secondary malignant neoplasm of liver and intrahepatic bile duct: Secondary | ICD-10-CM

## 2012-10-20 NOTE — Progress Notes (Signed)
Patient completes 5 of 5 radiation treatments.Doing well.Denies pain, nausea.Will schedule one month follow up.

## 2012-10-20 NOTE — Progress Notes (Signed)
Department of Radiation Oncology  Phone:  [phone] Fax:        [phone]  Weekly Treatment Note    Name: Emily Lawson Date: 10/20/2012 MRN: [id number] DOB: Oct 27, 1936   Current dose: 50 Gy  Current fraction: 5   MEDICATIONS: Current Outpatient Prescriptions  Medication Sig Dispense Refill  . acetaminophen (TYLENOL) 500 MG tablet Take 500 mg by mouth every 6 (six) hours as needed.        Marland Kitchen alendronate (FOSAMAX) 70 MG tablet Take 70 mg by mouth Once a week. Will begin 03/14/12      . amLODipine (NORVASC) 5 MG tablet Take 1 tablet by mouth Daily.       Marland Kitchen aspirin 81 MG tablet Take 81 mg by mouth 2 (two) times daily.       . Cholecalciferol (VITAMIN D-3 PO) Take 5,000 Units by mouth daily.       . CYMBALTA 60 MG capsule Take 60 mg by mouth at bedtime.       . Diphenhyd-Hydrocort-Nystatin (FIRST-DUKES MOUTHWASH) SUSP Use as directed 10 mLs in the mouth or throat 4 (four) times daily as needed (swish and spit prn mouth sores).  500 mL  1  . doxycycline (VIBRAMYCIN) 50 MG capsule Take 50 mg by mouth 2 (two) times daily. Decrease to daily as able      . glipiZIDE (GLUCOTROL XL) 10 MG 24 hr tablet Take 10 mg by mouth daily.       Marland Kitchen glucosamine-chondroitin 500-400 MG tablet Take 1 tablet by mouth 2 (two) times daily.      . hydrochlorothiazide (HYDRODIURIL) 25 MG tablet Take 25 mg by mouth daily.       Marland Kitchen lidocaine-prilocaine (EMLA) cream APPLY TO PAC SITE 1 HOUR PRIOR TO STICK, COVER WITH PLASTIC  30 g  PRN  . lisinopril (PRINIVIL,ZESTRIL) 20 MG tablet Take 20 mg by mouth daily.       . metFORMIN (GLUCOPHAGE) 1000 MG tablet Take 1,000 mg by mouth 2 (two) times daily with a meal.       . naproxen sodium (ANAPROX) 220 MG tablet Take 220 mg by mouth 2 (two) times daily with a meal.      . pantoprazole (PROTONIX) 40 MG tablet Take 40 mg by mouth daily.       . pravastatin (PRAVACHOL) 40 MG tablet Take 40 mg by mouth at bedtime.       . traMADol (ULTRAM) 50 MG tablet Take 50 mg by mouth  every 6 (six) hours as needed. Has not started taking this medication       No current facility-administered medications for this encounter.   Facility-Administered Medications Ordered in Other Encounters  Medication Dose Route Frequency Provider Last Rate Last Dose  . fluorouracil (ADRUCIL) 3,000 mg in sodium chloride 0.9 % 150 mL chemo infusion  1,500 mg/m2 (Treatment Plan Actual) Intravenous 1 day or 1 dose Ladene Artist, MD   3,000 mg at 07/04/12 1542     ALLERGIES: Avandia and Oxaliplatin   LABORATORY DATA:  Lab Results  Component Value Date   WBC 9.9 09/29/2012   HGB 9.5* 09/29/2012   HCT 29.3* 09/29/2012   MCV 99.2 09/29/2012   PLT 258 09/29/2012   Lab Results  Component Value Date   NA 137 10/02/2012   K 4.6 10/02/2012   CL 105 10/02/2012   CO2 21* 10/02/2012   Lab Results  Component Value Date   ALT 10 10/02/2012   AST 14 10/02/2012  ALKPHOS 90 10/02/2012   BILITOT 0.60 10/02/2012     NARRATIVE: Emily Lawson was seen today for weekly treatment management. The chart was checked and the patient's films were reviewed. The patient complains of some fatigue. Otherwise she is done very well with no complaints.  PHYSICAL EXAMINATION: weight is 198 lb 11.2 oz (90.13 kg). Her temperature is 98.7 F (37.1 C). Her blood pressure is 146/69 and her pulse is 84.      possibly some faint hyperpigmentation in the skin in the treatment area. No desquamation.  ASSESSMENT: The patient did satisfactorily with treatment.  PLAN: Followup in one month after undergoing a repeat MRI scan.

## 2012-10-23 ENCOUNTER — Telehealth: Payer: Self-pay | Admitting: *Deleted

## 2012-10-23 NOTE — Telephone Encounter (Signed)
XXXX 

## 2012-10-28 NOTE — Progress Notes (Signed)
  Radiation Oncology         [phone] ________________________________  Name: Emily Lawson MRN: [id number]  Date: 10/12/2012  DOB: 09/02/1936  Stereotactic Body Radiotherapy Treatment Procedure Note  NARRATIVE:  Emily Lawson was brought to the stereotactic radiation treatment machine and placed supine on the CT couch for her second SBRT treatment. The patient was set up for stereotactic body radiotherapy on the body fix pillow.  3D TREATMENT PLANNING AND DOSIMETRY:  The patient's radiation plan was reviewed and approved prior to starting treatment.  It showed 3-dimensional radiation distributions overlaid onto the planning CT.  The [facility] for the target structures as well as the organs at risk were reviewed. The documentation of this is filed in the radiation oncology EMR.  SIMULATION VERIFICATION:  The patient underwent CT imaging on the treatment unit.  These were carefully aligned to document that the ablative radiation dose would cover the target volume and maximally spare the nearby organs at risk according to the planned distribution.  SPECIAL TREATMENT PROCEDURE: Emily Lawson received high dose ablative stereotactic body radiotherapy to the planned target volume without unforeseen complications. Treatment was delivered uneventfully. The high doses associated with stereotactic body radiotherapy and the significant potential risks require careful treatment set up and patient monitoring constituting a special treatment procedure   STEREOTACTIC TREATMENT MANAGEMENT:  Following delivery, the patient was evaluated clinically. The patient tolerated treatment without significant acute effects, and was discharged to home in stable condition.    PLAN: Continue treatment as planned.  ________________________________

## 2012-11-06 NOTE — Addendum Note (Signed)
Encounter addended by: Jonna Coup, MD on: 11/06/2012  4:57 PM<BR>     Documentation filed: Notes Section

## 2012-11-06 NOTE — Progress Notes (Signed)
[facility] Radiation Oncology Simulation and Treatment Planning Note   Name:  Emily Lawson MRN: [id number]   Date: 09/29/2012  DOB: 02-13-1936  Status:outpatient    DIAGNOSIS: The primary encounter diagnosis was Colon cancer. A diagnosis of Secondary malignant neoplasm of liver(197.7) was also pertinent to this visit.  SITE:  Liver metastasis   CONSENT VERIFIED:yes   SET UP: Patient is setup supine   IMMOBILIZATION: The patient was immobilized using a bodyfix bag, and a customized accuform device was also constructed to aid in patient immobilization. The patient set up also involved abdominal compression to attempt to reduce respiratory motion. A total of 2 complex treatment devices therefore will be used for immobilization during the course of radiation.    NARRATIVE:The patient was brought to the CT Simulation planning suite.  Identity was confirmed.  All relevant records and images related to the planned course of therapy were reviewed.  Then, the patient was positioned in a stable reproducible clinical set-up for radiation therapy. Abdominal compression was applied by me.  4D CT images were obtained and reproducible breathing pattern was confirmed. Free breathing CT images were obtained.  Skin markings were placed.  The CT images were loaded into the planning software where the target and avoidance structures were contoured.  The radiation prescription was entered and confirmed.    TREATMENT PLANNING NOTE:  Treatment planning then occurred. I have requested : MLC's, 3D simulation/ isodose plan, basic dose calculation. It is anticipated that 4 customized fields will be used for the patient's treatment, with each of these corresponding to an additional complex treatment device. These 4 treatment fields will target 3 metastases which have been contoured within the liver. The 2 most inferior lesions will be treated simultaneously.  3 dimensional simulation is performed  and dose volume histogram of the gross tumor volume, planning tumor volume and criticial normal structures including the spinal cord and liver were analyzed and requested.  Special treatment procedure was performed due to high dose per fraction and the complexity of the planning process.  The patient will be monitored for increased risk of toxicity.  Daily imaging using cone beam CT/ MV CT will be used for target localization.   PLAN:  The patient will receive 50 Gy in 5 fractions.    ________________________________   Radene Gunning, MD, PhD

## 2012-11-06 NOTE — Progress Notes (Signed)
  Radiation Oncology         [phone]) [phone] ________________________________  Name: Emily Lawson MRN: [id number]  Date: 10/20/2012  DOB: 11/11/1936   Simulation verification note  The patient underwent film verification for the patient's set-up in preparation for stereotactic body radiosurgery. The patient was placed on the treatment unit and a CT scan was performed. These images were then fused with the patient's planning CT scan. The fusion was carefully reviewed in terms of the patient's anatomy as it related to the planning CT scan. The target structures as well as the organs at risk were evaluated on the patient's treatment CT scan. The target and the normal structures were appropriately aligned for treatment. Therefore the patient proceeded with the fraction of stereotactic body radiosurgery.  Fraction: 5  Dose:  50 Gy   ________________________________  Radene Gunning, MD, PhD

## 2012-11-06 NOTE — Progress Notes (Signed)
  Radiation Oncology         [phone]) [phone] ________________________________  Name: Emily Lawson MRN: [id number]  Date: 10/20/2012  DOB: [birth year]  End of Treatment Note  Diagnosis:   Liver metastasis     Indication for treatment:  Palliative       Radiation treatment dates:   10/10/2012 through 10/20/2012  Site/dose:   The patient was treated to 3 liver metastases. These were contoured separately. The 2 most inferior lesions were treated simultaneously with one planned and 1 isocenter. A total of 4 rapid heart fields were used in total. This corresponded to 2 fields for the superior most lesion, and 2 fields for the inferior 2 most lesions.  Narrative: The patient tolerated radiation treatment relatively well.   She did not exhibit any substantial signs of acute toxicity. No significant skin irritation.  Plan: The patient has completed radiation treatment. The patient will return to radiation oncology clinic for routine followup in one month. I advised the patient to call or return sooner if they have any questions or concerns related to their recovery or treatment. ________________________________  Radene Gunning, M.D., Ph.D.

## 2012-11-24 ENCOUNTER — Ambulatory Visit
Admission: RE | Admit: 2012-11-24 | Discharge: 2012-11-24 | Disposition: A | Discharge status: Home / Self Care | Payer: Medicare Other | Source: Ambulatory Visit | Attending: Radiation Oncology | Admitting: Radiation Oncology

## 2012-11-24 ENCOUNTER — Other Ambulatory Visit: Payer: Self-pay | Admitting: Radiation Oncology

## 2012-11-24 ENCOUNTER — Other Ambulatory Visit: Payer: Self-pay | Admitting: *Deleted

## 2012-11-24 ENCOUNTER — Ambulatory Visit (HOSPITAL_COMMUNITY)
Admission: RE | Admit: 2012-11-24 | Discharge: 2012-11-24 | Disposition: A | Discharge status: Home / Self Care | Payer: Medicare Other | Source: Ambulatory Visit | Attending: Radiation Oncology | Admitting: Radiation Oncology

## 2012-11-24 DIAGNOSIS — C787 Secondary malignant neoplasm of liver and intrahepatic bile duct: Secondary | ICD-10-CM | POA: Insufficient documentation

## 2012-11-24 DIAGNOSIS — Z923 Personal history of irradiation: Secondary | ICD-10-CM | POA: Insufficient documentation

## 2012-11-24 DIAGNOSIS — C189 Malignant neoplasm of colon, unspecified: Secondary | ICD-10-CM | POA: Insufficient documentation

## 2012-11-24 LAB — BUN AND CREATININE (CC13)
BUN: 21 mg/dL (ref 7.0–26.0)
Creatinine: 1 mg/dL (ref 0.6–1.1)

## 2012-11-24 MED ORDER — GADOBENATE DIMEGLUMINE 529 MG/ML IV SOLN
18.0000 mL | Freq: Once | INTRAVENOUS | Status: AC | PRN
  Administered 2012-11-24: 18 mL via INTRAVENOUS

## 2012-12-06 ENCOUNTER — Telehealth: Payer: Self-pay | Admitting: *Deleted

## 2012-12-06 ENCOUNTER — Telehealth: Payer: Self-pay | Admitting: Oncology

## 2012-12-06 NOTE — Telephone Encounter (Signed)
Spoke with Emily Lawson, informed her the schedulers will contact her with appt to see Dr. Truett Perna.

## 2012-12-06 NOTE — Telephone Encounter (Signed)
Gave pt appt for February 2014 with ML

## 2012-12-07 ENCOUNTER — Ambulatory Visit
Admission: RE | Admit: 2012-12-07 | Discharge: 2012-12-07 | Disposition: A | Discharge status: Home / Self Care | Payer: Medicare Other | Source: Ambulatory Visit | Attending: Radiation Oncology | Admitting: Radiation Oncology

## 2012-12-07 ENCOUNTER — Encounter: Payer: Self-pay | Admitting: Radiation Oncology

## 2012-12-07 VITALS — BP 121/56 | HR 58 | Temp 98.0°F | Resp 20 | Wt 198.7 lb

## 2012-12-07 DIAGNOSIS — C787 Secondary malignant neoplasm of liver and intrahepatic bile duct: Secondary | ICD-10-CM

## 2012-12-07 NOTE — Progress Notes (Signed)
Radiation Oncology         [phone] ________________________________  Name: Emily Lawson MRN: [id number]  Date: 12/07/2012  DOB: [birth year]  Follow-Up Visit Note  CC: Lolita Patella, MD  Ladene Artist, MD  Diagnosis:   Metastatic colon cancer with liver metastases  Interval Since Last Radiation:  6 weeks   Narrative:  The patient returns today for routine follow-up.  The patient completed her course of stereotactic body radiotherapy to 3 liver metastases on 10/20/2012. The patient did well during treatment. She states that she has continued to do so. She has no GI complaints today. No nausea, GI upset, or decreased appetite. The patient had a MRI scan of the abdomen completed and she presents today for followup and for discussion of this.                              ALLERGIES:  is allergic to avandia and oxaliplatin.  Meds: Current Outpatient Prescriptions  Medication Sig Dispense Refill  . acetaminophen (TYLENOL) 500 MG tablet Take 500 mg by mouth every 6 (six) hours as needed.        Marland Kitchen alendronate (FOSAMAX) 70 MG tablet Take 70 mg by mouth Once a week. Will begin 03/14/12      . amLODipine (NORVASC) 5 MG tablet Take 1 tablet by mouth Daily.       Marland Kitchen aspirin 81 MG tablet Take 81 mg by mouth 2 (two) times daily.       . Cholecalciferol (VITAMIN D-3 PO) Take 5,000 Units by mouth daily.       . CYMBALTA 60 MG capsule Take 60 mg by mouth at bedtime.       . Diphenhyd-Hydrocort-Nystatin (FIRST-DUKES MOUTHWASH) SUSP Use as directed 10 mLs in the mouth or throat 4 (four) times daily as needed (swish and spit prn mouth sores).  500 mL  1  . doxycycline (VIBRAMYCIN) 50 MG capsule Take 50 mg by mouth 2 (two) times daily. Decrease to daily as able      . glipiZIDE (GLUCOTROL XL) 10 MG 24 hr tablet Take 10 mg by mouth daily.       Marland Kitchen glucosamine-chondroitin 500-400 MG tablet Take 1 tablet by mouth 2 (two) times daily.      . hydrochlorothiazide (HYDRODIURIL) 25 MG tablet Take 25 mg by  mouth daily.       Marland Kitchen lidocaine-prilocaine (EMLA) cream APPLY TO PAC SITE 1 HOUR PRIOR TO STICK, COVER WITH PLASTIC  30 g  PRN  . lisinopril (PRINIVIL,ZESTRIL) 20 MG tablet Take 20 mg by mouth daily.       . metFORMIN (GLUCOPHAGE) 1000 MG tablet Take 1,000 mg by mouth 2 (two) times daily with a meal.       . naproxen sodium (ANAPROX) 220 MG tablet Take 220 mg by mouth 2 (two) times daily with a meal.      . pantoprazole (PROTONIX) 40 MG tablet Take 40 mg by mouth daily.       . pravastatin (PRAVACHOL) 40 MG tablet Take 40 mg by mouth at bedtime.       . traMADol (ULTRAM) 50 MG tablet Take 50 mg by mouth every 6 (six) hours as needed. Has not started taking this medication       No current facility-administered medications for this encounter.   Facility-Administered Medications Ordered in Other Encounters  Medication Dose Route Frequency Provider Last Rate Last Dose  . fluorouracil (  ADRUCIL) 3,000 mg in sodium chloride 0.9 % 150 mL chemo infusion  1,500 mg/m2 (Treatment Plan Actual) Intravenous 1 day or 1 dose Ladene Artist, MD   3,000 mg at 07/04/12 1542    Physical Findings: The patient is in no acute distress. Patient is alert and oriented.  weight is 198 lb 11.2 oz (90.13 kg). Her oral temperature is 98 F (36.7 C). Her blood pressure is 121/56 and her pulse is 58. Her respiration is 20. Marland Kitchen   General: Well-developed, in no acute distress HEENT: Normocephalic, atraumatic Cardiovascular: Regular rate and rhythm Respiratory: Clear to auscultation bilaterally GI: Soft, nontender, normal bowel sounds Extremities: No edema present   Lab Findings: Lab Results  Component Value Date   WBC 9.9 09/29/2012   HGB 9.5* 09/29/2012   HCT 29.3* 09/29/2012   MCV 99.2 09/29/2012   PLT 258 09/29/2012     Radiographic Findings: Mr Liver W Wo Contrast  11/24/2012  *RADIOLOGY REPORT*  Clinical Data:  Metastatic colon cancer status post radiation therapy.  MRI ABDOMEN WITHOUT AND WITH CONTRAST   Technique:  Multiplanar multisequence MR imaging of the abdomen was performed both before and after the administration of intravenous contrast.  Contrast: 18mL MULTIHANCE GADOBENATE DIMEGLUMINE 529 MG/ML IV SOLN  Comparison:  Abdominal MRI 03/02/2011, PET CT 12/24/2011 and abdominal CT 07/28/2012.  Findings:  Again demonstrated are three peripheral enhancing liver lesions consistent with metastatic disease.  These are most easily measured on the precontrast gradient echo images (series 1100). The central lesion in the medial segment of the left hepatic lobe measures 2.6 x 1.6 cm (image 33) compared with 4.0 x 2.2 cm on the most recent CT.  Lesions inferiorly in the right hepatic lobe measure 2.9 x 1.9 cm (image 47) and 2.4 x 1.8 cm (image 55).  On the most recent CT, these lesions measured 3.8 x 2.3 cm (remeasured), and 3.1 x 2.8 cm, respectively. These lesions have enlarged from the prior MRI.  No new lesions are identified.  The spleen, pancreas, biliary system and adrenal glands appear unremarkable.  There is a stable left renal cyst as well as additional tiny renal lesions bilaterally.  Duodenal diverticulum is again noted.  No enlarged lymph nodes, ascites or peritoneal nodularity is demonstrated.  A small ventral hernia and degenerative changes in the spine are stable.  IMPRESSION:  1.  Compared with the most recent CT of 4 months ago, there has been improvement in the three hepatic metastases. 2.  No progressive disease demonstrated.   Original Report Authenticated By: Carey Bullocks, M.D.     Impression:    The patient is doing well after her course of stereotactic body radiotherapy. Each of the 3 liver lesions did decrease in size, and I felt that this was a good response considering the timing of the study. I discussed the implications of this as well as the good news that no new significant findings were present.   Plan:  The patient will return to clinic for ongoing followup in 4 months after  undergoing a repeat MRI scan of the liver. She is also scheduled to see Dr. Truett Perna within the next several weeks.   Radene Gunning, M.D., Ph.D.

## 2012-12-07 NOTE — Progress Notes (Signed)
Pt c/o pain in knees, has had this issue. Daughter states may be due to inactivity. Pt denies loss of appetite, nausea, bowel issues. She states yesterday she experienced urinary frequency, stream started/stopped. Daughter states pt has hx of this but no hx of UTIs. Pt denies dysuria, dark, cloudy urine, foul odor.

## 2012-12-08 ENCOUNTER — Ambulatory Visit: Payer: Self-pay | Admitting: Radiation Oncology

## 2012-12-08 ENCOUNTER — Telehealth: Payer: Self-pay | Admitting: *Deleted

## 2012-12-08 NOTE — Telephone Encounter (Signed)
CALLED PATIENT TO INFORM OF TEST AND FU FOR MAY 2014, SPOKE WITH PATIENT AND SHE IS AWARE OF THIS  TEST AND FU

## 2013-01-01 ENCOUNTER — Ambulatory Visit (HOSPITAL_BASED_OUTPATIENT_CLINIC_OR_DEPARTMENT_OTHER): Payer: Medicare Other | Admitting: Nurse Practitioner

## 2013-01-01 ENCOUNTER — Telehealth: Payer: Self-pay | Admitting: Oncology

## 2013-01-01 VITALS — BP 159/62 | HR 100 | Temp 97.2°F | Resp 20 | Ht 60.0 in | Wt 199.6 lb

## 2013-01-01 DIAGNOSIS — C787 Secondary malignant neoplasm of liver and intrahepatic bile duct: Secondary | ICD-10-CM

## 2013-01-01 DIAGNOSIS — C189 Malignant neoplasm of colon, unspecified: Secondary | ICD-10-CM

## 2013-01-01 MED ORDER — SODIUM CHLORIDE 0.9 % IJ SOLN
10.0000 mL | INTRAMUSCULAR | Status: DC | PRN
  Administered 2013-01-01: 10 mL via INTRAVENOUS
  Filled 2013-01-01: qty 10

## 2013-01-01 MED ORDER — HEPARIN SOD (PORK) LOCK FLUSH 100 UNIT/ML IV SOLN
500.0000 [IU] | Freq: Once | INTRAVENOUS | Status: AC
  Administered 2013-01-01: 500 [IU] via INTRAVENOUS
  Filled 2013-01-01: qty 5

## 2013-01-01 NOTE — Telephone Encounter (Signed)
Gave pt appt for March and May 2014 lab and MD

## 2013-01-01 NOTE — Progress Notes (Signed)
OFFICE PROGRESS NOTE  Interval history:  Ms. Gailey a is a 77 year old woman with metastatic colon cancer. She was most recently treated with FOLFIRI/Avastin on 09/12/2012. She received radiation to 3 liver metastases 10/10/2012 through 10/20/2012. Followup MRI of the abdomen 11/24/2012 showed improvement in the 3 hepatic metastases. No progressive disease was demonstrated.  She overall is feeling well. Appetite varies. No nausea or vomiting. Bowel habits have been erratic. She attributes this to eating habits. She has occasional mild pain at the left abdomen.   Objective: Blood pressure 159/62, pulse 100, temperature 97.2 F (36.2 C), temperature source Oral, resp. rate 20, height 5' (1.524 m), weight 199 lb 9.6 oz (90.538 kg).  Oropharynx is without thrush or ulceration. No palpable cervical, supraclavicular or axillary lymph nodes. She is mildly tender in the right axilla. No palpable abnormality. Lungs are clear. Regular cardiac rhythm. Port-A-Cath site is without erythema. Abdomen is soft and nontender. No hepatomegaly. Extremities are without edema.  Lab Results: Lab Results  Component Value Date   WBC 9.9 09/29/2012   HGB 9.5* 09/29/2012   HCT 29.3* 09/29/2012   MCV 99.2 09/29/2012   PLT 258 09/29/2012    Chemistry:    Chemistry      Component Value Date/Time   NA 137 10/02/2012 1458   NA 135 07/17/2012 1058   K 4.6 10/02/2012 1458   K 4.7 07/17/2012 1058   CL 105 10/02/2012 1458   CL 103 07/17/2012 1058   CO2 21* 10/02/2012 1458   CO2 18* 07/17/2012 1058   BUN 21.0 11/24/2012 0944   BUN 21 07/17/2012 1058   CREATININE 1.0 11/24/2012 0944   CREATININE 1.26* 07/17/2012 1058   CREATININE 1.29* 05/20/2009 1423   GLU 102* 10/19/2007 1433      Component Value Date/Time   CALCIUM 9.2 10/02/2012 1458   CALCIUM 8.7 07/17/2012 1058   ALKPHOS 90 10/02/2012 1458   ALKPHOS 85 07/17/2012 1058   AST 14 10/02/2012 1458   AST 17 07/17/2012 1058   ALT 10 10/02/2012 1458   ALT 13 07/17/2012 1058   BILITOT 0.60 10/02/2012 1458   BILITOT 0.4 07/17/2012 1058       Studies/Results: No results found.  Medications: I have reviewed the patient's current medications.  Assessment/Plan:  1. Metastatic colon cancer presenting with synchronous colon primaries in September, 2008, status post a hemicolectomy and liver biopsy 08/03/2007. A CT on 04/20/2010 revealed no evidence metastatic disease. She was maintained on 5-FU/leucovorin per the FOLFOX regimen beginning in April, 2009. Avasta was discontinued from the chemotherapy regimen beginning in November, 2009 due to nephrotic range proteinuria. A CT of the abdomen and pelvis 02/05/2011 revealed evidence of three small liver lesions. An MRI of the abdomen on 03/02/2011 confirmed 3 liver lesions and no additional evidence of metastatic disease. K-RAS testing on the transverse colon and left colon tumors revealed a wild type genotype.  1A. Staging PET scan 03/21/2011 confirmed 3 hypermetabolic liver lesions and no additional evidence of metastatic disease.  1B. Surgical consultation by Dr. Donell Beers was obtained and Ms. Delamater decided against an attempt at surgical resection/radiofrequency ablation.  1C. Salvage therapy with FOLFOX was initiated 04/13/2011.  1D. A restaging CT 06/28/2011 revealed a significant improvement in the hepatic metastasis.  1E. She completed eight cycles of FOLFOX chemotherapy. Treatment was switched to 5FU-leucovorin as per the FOLFOX regimen beginning with cycle #9 due to an allergic reaction (skin rash) following cycle #8.  69F. Restaging CT 10/25/2011 revealed 2 more conspicuous liver metastases .  1G. Restaging PET scan 12/24/2011 revealed an increase in the size and metabolic activity of 3 liver metastases  1H. She began FOLFIRI chemotherapy on 01/19/2012.  1 I. Restaging CT of the abdomen 05/08/2012 revealed a mixed response with enlargement of 2 liver lesions and a decrease in the size of a third lesion, no new lesions.  1J.  FOLFIRI chemotherapy continued with the addition of Avastin beginning 05/09/2012.  1K. restaging CT 07/28/2012 with no new liver lesions and a decreased size of the previously noted 3 liver lesions. 1L. She was last treated with FOLFIRI/Avastin on 09/12/2012. 55M. She completed stereotactic radiotherapy to 3 liver metastases 10/10/2012 through 10/20/2012. 1N. Follow-up MRI of the abdomen 11/24/2012 showed improvement in the 3 hepatic metastases and no progressive disease. 1. Diabetes. 2. History of proteinuria secondary to Avastin versus diabetes. 3. Mucositis and hand foot syndrome, secondary to 5FU improved with a dose reduction of the 5-FU. 4. Anemia secondary to chemotherapy.  5. Left back and left leg pain with an MRI of the lumbar spine confirming lumbar disk disease. She is status post surgery by Dr. Franky Macho 02/14/2008 with resolution of the back and left leg pain. 6. Rosacea. She is followed by dermatology. A recent flare, now maintained on doxycycline 7. Depression, maintained on Cymbalta-she requested a referral to Dr. Noe Gens 8. History of bilateral knee pain secondary to arthritis -- she takes Vicodin as needed. 9. History of anorexia -- weight loss -- ?Related to depression/anxiety or metastatic colon cancer. 10. Allergic reaction to oxaliplatin with FOLFOX on 04/13/2011. She was able to tolerate FOLFOX on 04/20/2011 with steroid premedication and a prolonged oxaliplatin infusion. After cycle #8 she developed a rash upon returning home with pruritus. She took Benadryl with resolution of symptoms. This reaction occurred despite steroid premedication. 11. Poor dentition -- she has been evaluated by Dr. Dalene Carrow. She has elected to delay recommended tooth extractions. 12. Neutropenia following cycle 1 of FOLFIRI. The FOLFIRI was dose reduced beginning with cycle 2. She was neutropenic 03/14/2012 and chemotherapy was held for one week.  13. Nausea and diarrhea during the irinotecan infusion  on 03/21/2012. Symptoms resolved with atropine. She receives atropine as a premedication with each cycle. 14. Diarrhea following the 05/09/2012 chemotherapy cycle. Likely related to irinotecan. She takes Imodium as needed. 15. Diffuse pain 4-5 days after chemotherapy 07/17/2012-likely toxicity from Neulasta. 16. History of a fall with fracture of the right fifth metacarpal.  Disposition-Ms. Gengler appears stable. A repeat MRI of the abdomen is planned in May of this year per Dr. Mitzi Hansen. She last had chemotherapy in October 2013. Dr. Truett Perna recommends continuing to follow with observation with the MRI as above. Port-A-Cath is being flushed today. She will return in 6 weeks for the next Port-A-Cath flush and a CEA. She will return for a followup visit in approximately 3 months. She will contact the office in the interim with any problems.  Plan reviewed with Dr. Truett Perna.    Lonna Cobb ANP/GNP-BC

## 2013-02-12 ENCOUNTER — Ambulatory Visit (HOSPITAL_BASED_OUTPATIENT_CLINIC_OR_DEPARTMENT_OTHER): Payer: Medicare Other

## 2013-02-12 ENCOUNTER — Other Ambulatory Visit: Payer: Medicare Other

## 2013-02-12 VITALS — BP 135/60 | HR 72 | Temp 97.6°F | Resp 20

## 2013-02-12 MED ORDER — HEPARIN SOD (PORK) LOCK FLUSH 100 UNIT/ML IV SOLN
500.0000 [IU] | Freq: Once | INTRAVENOUS | Status: AC
  Administered 2013-02-12: 500 [IU] via INTRAVENOUS
  Filled 2013-02-12: qty 5

## 2013-02-12 MED ORDER — SODIUM CHLORIDE 0.9 % IJ SOLN
10.0000 mL | INTRAMUSCULAR | Status: DC | PRN
  Administered 2013-02-12: 10 mL via INTRAVENOUS
  Filled 2013-02-12: qty 10

## 2013-04-06 ENCOUNTER — Other Ambulatory Visit: Payer: Self-pay | Admitting: Radiation Oncology

## 2013-04-06 ENCOUNTER — Ambulatory Visit
Admission: RE | Admit: 2013-04-06 | Discharge: 2013-04-06 | Disposition: A | Discharge status: Home / Self Care | Payer: Medicare Other | Source: Ambulatory Visit | Attending: Radiation Oncology | Admitting: Radiation Oncology

## 2013-04-06 DIAGNOSIS — C787 Secondary malignant neoplasm of liver and intrahepatic bile duct: Secondary | ICD-10-CM | POA: Insufficient documentation

## 2013-04-06 DIAGNOSIS — C801 Malignant (primary) neoplasm, unspecified: Secondary | ICD-10-CM | POA: Insufficient documentation

## 2013-04-09 ENCOUNTER — Other Ambulatory Visit: Payer: Self-pay | Admitting: Radiation Oncology

## 2013-04-09 ENCOUNTER — Ambulatory Visit (HOSPITAL_COMMUNITY)
Admission: RE | Admit: 2013-04-09 | Discharge: 2013-04-09 | Disposition: A | Discharge status: Home / Self Care | Payer: Medicare Other | Source: Ambulatory Visit | Attending: Radiation Oncology | Admitting: Radiation Oncology

## 2013-04-09 DIAGNOSIS — C787 Secondary malignant neoplasm of liver and intrahepatic bile duct: Secondary | ICD-10-CM | POA: Insufficient documentation

## 2013-04-09 DIAGNOSIS — N281 Cyst of kidney, acquired: Secondary | ICD-10-CM | POA: Insufficient documentation

## 2013-04-09 DIAGNOSIS — C189 Malignant neoplasm of colon, unspecified: Secondary | ICD-10-CM | POA: Insufficient documentation

## 2013-04-09 MED ORDER — GADOBENATE DIMEGLUMINE 529 MG/ML IV SOLN
20.0000 mL | Freq: Once | INTRAVENOUS | Status: AC | PRN
  Administered 2013-04-09: 20 mL via INTRAVENOUS

## 2013-04-11 ENCOUNTER — Encounter: Payer: Self-pay | Admitting: Oncology

## 2013-04-12 ENCOUNTER — Ambulatory Visit
Admission: RE | Admit: 2013-04-12 | Discharge: 2013-04-12 | Disposition: A | Discharge status: Home / Self Care | Payer: Medicare Other | Source: Ambulatory Visit | Attending: Radiation Oncology | Admitting: Radiation Oncology

## 2013-04-12 VITALS — BP 123/79 | HR 84 | Temp 98.0°F | Ht 60.0 in | Wt 206.0 lb

## 2013-04-12 DIAGNOSIS — C787 Secondary malignant neoplasm of liver and intrahepatic bile duct: Secondary | ICD-10-CM

## 2013-04-12 NOTE — Progress Notes (Signed)
Radiation Oncology         [phone] ________________________________  Name: Emily Lawson MRN: [id number]  Date: 04/12/2013  DOB: March 01, 1936  Follow-Up Visit Note  CC: Lolita Patella, MD  Ladene Artist, MD  Diagnosis:   Metastatic colon cancer with liver metastases  Interval Since Last Radiation:  6 months   Narrative:  The patient returns today for routine follow-up.  The patient indicates that she has been doing well from her perspective. She denies any significant fatigue at this time. She indicates that she has had some occasional diarrhea but this has been quite infrequent. No ongoing difficulties. No nausea. Her energy level has been good.  The patient underwent a recent MRI scan of the abdomen and presents today for review of these findings with respect to her liver disease.                              ALLERGIES:  is allergic to avandia and oxaliplatin.  Meds: Current Outpatient Prescriptions  Medication Sig Dispense Refill  . alendronate (FOSAMAX) 70 MG tablet Take 70 mg by mouth Once a week. Will begin 03/14/12      . amLODipine (NORVASC) 5 MG tablet Take 1 tablet by mouth Daily.       Marland Kitchen aspirin 81 MG tablet Take 81 mg by mouth 2 (two) times daily.       Marland Kitchen atorvastatin (LIPITOR) 40 MG tablet       . Cholecalciferol (VITAMIN D-3 PO) Take 5,000 Units by mouth daily.       . CYMBALTA 60 MG capsule Take 60 mg by mouth at bedtime.       . Diphenhyd-Hydrocort-Nystatin (FIRST-DUKES MOUTHWASH) SUSP Use as directed 10 mLs in the mouth or throat 4 (four) times daily as needed (swish and spit prn mouth sores).  500 mL  1  . glipiZIDE (GLUCOTROL XL) 10 MG 24 hr tablet Take 10 mg by mouth daily.       Marland Kitchen glucosamine-chondroitin 500-400 MG tablet Take 1 tablet by mouth 2 (two) times daily.      . hydrochlorothiazide (HYDRODIURIL) 25 MG tablet Take 25 mg by mouth daily.       Marland Kitchen lidocaine-prilocaine (EMLA) cream APPLY TO PAC SITE 1 HOUR PRIOR TO STICK, COVER WITH PLASTIC  30 g   PRN  . lisinopril (PRINIVIL,ZESTRIL) 20 MG tablet Take 20 mg by mouth daily.       . metFORMIN (GLUCOPHAGE) 1000 MG tablet Take 1,000 mg by mouth 2 (two) times daily with a meal.       . pantoprazole (PROTONIX) 40 MG tablet Take 40 mg by mouth daily.       Marland Kitchen acetaminophen (TYLENOL) 500 MG tablet Take 500 mg by mouth every 6 (six) hours as needed.        . doxycycline (VIBRAMYCIN) 50 MG capsule Take 50 mg by mouth 2 (two) times daily. Decrease to daily as able      . naproxen sodium (ANAPROX) 220 MG tablet Take 220 mg by mouth 2 (two) times daily with a meal.      . pravastatin (PRAVACHOL) 40 MG tablet Take 40 mg by mouth at bedtime.       . traMADol (ULTRAM) 50 MG tablet Take 50 mg by mouth every 6 (six) hours as needed. Has not started taking this medication       No current facility-administered medications for this encounter.  Facility-Administered Medications Ordered in Other Encounters  Medication Dose Route Frequency Provider Last Rate Last Dose  . fluorouracil (ADRUCIL) 3,000 mg in sodium chloride 0.9 % 150 mL chemo infusion  1,500 mg/m2 (Treatment Plan Actual) Intravenous 1 day or 1 dose Ladene Artist, MD   3,000 mg at 07/04/12 1542    Physical Findings: The patient is in no acute distress. Patient is alert and oriented.  height is 5' (1.524 m) and weight is 206 lb (93.441 kg). Her temperature is 98 F (36.7 C). Her blood pressure is 123/79 and her pulse is 84. Her oxygen saturation is 100%. .   General: Well-developed, in no acute distress HEENT: Normocephalic, atraumatic Cardiovascular: Regular rate and rhythm Respiratory: Clear to auscultation bilaterally GI: Soft, nontender, normal bowel sounds Extremities: No edema present   Lab Findings: Lab Results  Component Value Date   WBC 9.9 09/29/2012   HGB 9.5* 09/29/2012   HCT 29.3* 09/29/2012   MCV 99.2 09/29/2012   PLT 258 09/29/2012     Radiographic Findings: Mr Liver W Wo Contrast  04/09/2013   *RADIOLOGY REPORT*   Clinical Data:  Follow-up metastatic colon carcinoma. Undergoing radiation therapy.  MRI ABDOMEN WITHOUT AND WITH CONTRAST  Technique:  Multiplanar multisequence MR imaging of the abdomen was performed both before and after the administration of intravenous contrast.  Contrast: 20mL MULTIHANCE GADOBENATE DIMEGLUMINE 529 MG/ML IV SOLN  Comparison:  11/24/2012  Findings:  Radiation changes are seen involving the liver. Previously seen to adjacent metastatic lesions in the inferior right hepatic lobe of both decreased in size, currently measuring 1.5 cm compared to 2.4 cm previously, and 2.0 cm compared to 2.9 cm previously.  Previously seen third metastatic lesion in the medial segment left hepatic lobe is no longer visualized.  Two new metastatic lesions are seen, which are located in the lateral segment left hepatic lobe measuring 1.8 cm on image 47 of series 1101, and in the posterior right hepatic lobe measuring 2.2 cm on image 41.  The pancreas, spleen, and adrenal glands are normal appearance. Left renal cyst is stable there is no evidence of renal masses or hydronephrosis.  No evidence of abdominal lymphadenopathy.  No evidence of intra-abdominal inflammatory process or abnormal fluid collections.  IMPRESSION:  1.  Decrease in size of the three previously demonstrated liver metastases, with associated radiation changes. 2.  Two new liver metastases in the lateral segment left hepatic lobe and posterior segment right hepatic lobe, measuring 1.8 cm and 2.2 cm respectively.   Original Report Authenticated By: Myles Rosenthal, M.D.    Impression:    The patient has 2 new metastatic lesions within the liver measuring 1.8 cm and 2.2 cm. The 3 previously treated areas have continued to improve, with 2 lesions continuing to shrink and 1 lesion no longer able to be seen. The patient clinically is doing well with no significant new complaints.  Plan:  The patient is seeing Dr. Truett Perna in medical oncology tomorrow. I  would like to have her case re-presented next week so we can review all of her options. The patient received significant dose to the liver previously with treatment to the 3 areas, and any reading treatment in terms of stereotactic body radiotherapy would be higher risk. I would therefore be very interested in other alternatives and this can be discussed at conference.  I spent 15 minutes with the patient today, the majority of which was spent counseling the patient on the diagnosis of cancer and coordinating care.  Jodelle Gross, M.D., Ph.D.

## 2013-04-12 NOTE — Progress Notes (Signed)
Ms. Paro here with her son for follow up after treatment to 3 liver metastases.  She denies pain, nausea and fatigue.  She reports that her appetite is good.  She does have occasional diarrhea.

## 2013-04-13 ENCOUNTER — Ambulatory Visit (HOSPITAL_BASED_OUTPATIENT_CLINIC_OR_DEPARTMENT_OTHER): Payer: Medicare Other | Admitting: Oncology

## 2013-04-13 ENCOUNTER — Other Ambulatory Visit: Payer: Medicare Other

## 2013-04-13 ENCOUNTER — Telehealth: Payer: Self-pay | Admitting: Oncology

## 2013-04-13 VITALS — BP 135/70 | HR 78 | Temp 96.8°F | Resp 19 | Ht 60.0 in | Wt 205.4 lb

## 2013-04-13 DIAGNOSIS — Z85038 Personal history of other malignant neoplasm of large intestine: Secondary | ICD-10-CM

## 2013-04-13 MED ORDER — HEPARIN SOD (PORK) LOCK FLUSH 100 UNIT/ML IV SOLN
500.0000 [IU] | Freq: Once | INTRAVENOUS | Status: AC
  Administered 2013-04-13: 500 [IU] via INTRAVENOUS
  Filled 2013-04-13: qty 5

## 2013-04-13 MED ORDER — SODIUM CHLORIDE 0.9 % IJ SOLN
10.0000 mL | INTRAMUSCULAR | Status: DC | PRN
  Administered 2013-04-13: 10 mL via INTRAVENOUS
  Filled 2013-04-13: qty 10

## 2013-04-13 NOTE — Progress Notes (Signed)
[facility]    OFFICE PROGRESS NOTE   INTERVAL HISTORY:   She returns as scheduled. She has a good appetite and energy level. No significant pain. No new complaint. Emily Lawson saw Dr. Mitzi Hansen earlier this week after a restaging MRI of the abdomen. The MRI on 04/09/2013 was compared to a study from 11/24/2012. The 3 lesions treated with radiation were noted to improve. 2 of the lesions have decreased in size and one is no longer visualized. 2 new metastatic lesions were noted measuring 1.8 cm in the left lateral liver and 2.2 cm in the posterior right liver. No abdominal lymphadenopathy. No abnormal fluid collection.   Objective:  Vital signs in last 24 hours:  Blood pressure 135/70, pulse 78, temperature 96.8 F (36 C), temperature source Oral, resp. rate 19, height 5' (1.524 m), weight 205 lb 6.4 oz (93.169 kg).    HEENT: Neck without mass Lymphatics: No cervical, supra-clavicular, axillary, or inguinal nodes Resp:  Lungs clear bilaterally Cardio: Regular rate and rhythm GI: No hepatomegaly, no mass, nontender Vascular: No leg edema  Portacath/PICC-without erythema    Medications: I have reviewed the patient's current medications.  Assessment/Plan: 1. Metastatic colon cancer presenting with synchronous colon primaries in September, 2008, status post a hemicolectomy and liver biopsy 08/03/2007. A CT on 04/20/2010 revealed no evidence metastatic disease. She was maintained on 5-FU/leucovorin per the FOLFOX regimen beginning in April, 2009. Avasta was discontinued from the chemotherapy regimen beginning in November, 2009 due to nephrotic range proteinuria. A CT of the abdomen and pelvis 02/05/2011 revealed evidence of three small liver lesions. An MRI of the abdomen on 03/02/2011 confirmed 3 liver lesions and no additional evidence of metastatic disease. K-RAS testing on the transverse colon and left colon tumors revealed a wild type genotype.  1A. Staging PET scan  03/21/2011 confirmed 3 hypermetabolic liver lesions and no additional evidence of metastatic disease.  1B. Surgical consultation by Dr. Donell Beers was obtained and Emily Lawson decided against an attempt at surgical resection/radiofrequency ablation.  1C. Salvage therapy with FOLFOX was initiated 04/13/2011.  1D. A restaging CT 06/28/2011 revealed a significant improvement in the hepatic metastasis.  1E. She completed eight cycles of FOLFOX chemotherapy. Treatment was switched to 5FU-leucovorin as per the FOLFOX regimen beginning with cycle #9 due to an allergic reaction (skin rash) following cycle #8.  8F. Restaging CT 10/25/2011 revealed 2 more conspicuous liver metastases .  1G. Restaging PET scan 12/24/2011 revealed an increase in the size and metabolic activity of 3 liver metastases  1H. She began FOLFIRI chemotherapy on 01/19/2012.  1 I. Restaging CT of the abdomen 05/08/2012 revealed a mixed response with enlargement of 2 liver lesions and a decrease in the size of a third lesion, no new lesions.  1J. FOLFIRI chemotherapy continued with the addition of Avastin beginning 05/09/2012.  1K. restaging CT 07/28/2012 with no new liver lesions and a decreased size of the previously noted 3 liver lesions.  1L. She was last treated with FOLFIRI/Avastin on 09/12/2012.  86M. She completed stereotactic radiotherapy to 3 liver metastases 10/10/2012 through 10/20/2012.  1N. Follow-up MRI of the abdomen 11/24/2012 showed improvement in the 3 hepatic metastases and no progressive disease.  1O. Restaging MRI 04/09/2013 showed improvement in the 3 treated hepatic lesions and 2 new lesions. No evidence of metastatic disease outside of liver 1. Diabetes. 2. History of proteinuria secondary to Avastin versus diabetes. 3. Mucositis and hand foot syndrome, secondary to 5FU improved with a dose reduction  of the 5-FU. 4. Anemia secondary to chemotherapy.  5. Left back and left leg pain with an MRI of the lumbar spine  confirming lumbar disk disease. She is status post surgery by Dr. Franky Macho 02/14/2008 with resolution of the back and left leg pain. 6. Rosacea. She is followed by dermatology. A recent flare, now maintained on doxycycline 7. Depression, maintained on Cymbalta-she requested a referral to Dr. Noe Gens 8. History of bilateral knee pain secondary to arthritis -- she takes Vicodin as needed. 9. History of anorexia -- weight loss -- ?Related to depression/anxiety or metastatic colon cancer. 10. Allergic reaction to oxaliplatin with FOLFOX on 04/13/2011. She was able to tolerate FOLFOX on 04/20/2011 with steroid premedication and a prolonged oxaliplatin infusion. After cycle #8 she developed a rash upon returning home with pruritus. She took Benadryl with resolution of symptoms. This reaction occurred despite steroid premedication. 11. Poor dentition -- she has been evaluated by Dr. Dalene Carrow. She has elected to delay recommended tooth extractions. 12. Neutropenia following cycle 1 of FOLFIRI. The FOLFIRI was dose reduced beginning with cycle 2. She was neutropenic 03/14/2012 and chemotherapy was held for one week.  13. Nausea and diarrhea during the irinotecan infusion on 03/21/2012. Symptoms resolved with atropine. She receives atropine as a premedication with each cycle. 14. Diarrhea following the 05/09/2012 chemotherapy cycle. Likely related to irinotecan. She takes Imodium as needed. 15. Diffuse pain 4-5 days after chemotherapy 07/17/2012-likely toxicity from Neulasta. 16. History of a fall with fracture of the right fifth metacarpal.   Disposition:  Emily Lawson maintains an excellent performance status. I discussed the restaging MRI with Emily Lawson and her son. We discussed treatment options. She appears asymptomatic from the metastatic colon cancer. We discussed continued observation, radiation, radioablation, and salvage systemic therapy. I will present her case at the GI tumor conference on 04/18/2013.  The plan is to continue observation for now. She will return for an office visit and CEA in 6 weeks. The Port-A-Cath was flushed today.  Thornton Papas, MD  04/13/2013  6:49 PM

## 2013-04-13 NOTE — Telephone Encounter (Signed)
Talked to pt and gave her appt for lab,flush and MD visit on June 2014

## 2013-05-07 ENCOUNTER — Telehealth: Payer: Self-pay | Admitting: *Deleted

## 2013-05-07 NOTE — Telephone Encounter (Signed)
Message from pt asking what was determined at oncology conference? Reviewed with GI navigator. Left message on voicemail for pt to call office. MD is planning to discuss this with her at next visit. (There are several systemic treatments available. Ablation is not an option. May be a candidate for Y90.)

## 2013-05-08 NOTE — Telephone Encounter (Signed)
Made patient aware that ablation is not option for her and MD will go into detail at her next visit on 6/27. Gave her appointment for lab/flush on 05/16/13. Made her aware that the plan is for chemotherapy to begin again. Suggested she be sure to have a family member with her for the appointment to help her write everything down. She agrees.

## 2013-05-16 ENCOUNTER — Ambulatory Visit (HOSPITAL_BASED_OUTPATIENT_CLINIC_OR_DEPARTMENT_OTHER): Payer: Medicare Other

## 2013-05-16 ENCOUNTER — Other Ambulatory Visit (HOSPITAL_BASED_OUTPATIENT_CLINIC_OR_DEPARTMENT_OTHER): Payer: Medicare Other | Admitting: Lab

## 2013-05-16 VITALS — BP 130/56 | HR 64 | Temp 98.4°F | Resp 20

## 2013-05-16 DIAGNOSIS — C189 Malignant neoplasm of colon, unspecified: Secondary | ICD-10-CM

## 2013-05-16 DIAGNOSIS — Z85038 Personal history of other malignant neoplasm of large intestine: Secondary | ICD-10-CM

## 2013-05-16 DIAGNOSIS — Z452 Encounter for adjustment and management of vascular access device: Secondary | ICD-10-CM

## 2013-05-16 LAB — CBC WITH DIFFERENTIAL/PLATELET
BASO%: 1.1 % (ref 0.0–2.0)
Basophils Absolute: 0.1 10*3/uL (ref 0.0–0.1)
HCT: 28.8 % — ABNORMAL LOW (ref 34.8–46.6)
HGB: 9.9 g/dL — ABNORMAL LOW (ref 11.6–15.9)
MONO#: 0.4 10*3/uL (ref 0.1–0.9)
NEUT%: 63.7 % (ref 38.4–76.8)
WBC: 6.4 10*3/uL (ref 3.9–10.3)
lymph#: 1.5 10*3/uL (ref 0.9–3.3)

## 2013-05-16 LAB — COMPREHENSIVE METABOLIC PANEL (CC13)
ALT: 29 U/L (ref 0–55)
AST: 33 U/L (ref 5–34)
Albumin: 3.1 g/dL — ABNORMAL LOW (ref 3.5–5.0)
Alkaline Phosphatase: 244 U/L — ABNORMAL HIGH (ref 40–150)
BUN: 23.5 mg/dL (ref 7.0–26.0)
CO2: 21 mEq/L — ABNORMAL LOW (ref 22–29)
Calcium: 9.5 mg/dL (ref 8.4–10.4)
Chloride: 106 mEq/L (ref 98–107)
Creatinine: 1.1 mg/dL (ref 0.6–1.1)
Glucose: 173 mg/dl — ABNORMAL HIGH (ref 70–99)
Potassium: 4.1 mEq/L (ref 3.5–5.1)
Sodium: 139 mEq/L (ref 136–145)
Total Bilirubin: 1.02 mg/dL (ref 0.20–1.20)
Total Protein: 6.9 g/dL (ref 6.4–8.3)

## 2013-05-16 MED ORDER — SODIUM CHLORIDE 0.9 % IJ SOLN
10.0000 mL | INTRAMUSCULAR | Status: DC | PRN
  Administered 2013-05-16: 10 mL via INTRAVENOUS
  Filled 2013-05-16: qty 10

## 2013-05-16 MED ORDER — HEPARIN SOD (PORK) LOCK FLUSH 100 UNIT/ML IV SOLN
500.0000 [IU] | Freq: Once | INTRAVENOUS | Status: AC
  Administered 2013-05-16: 500 [IU] via INTRAVENOUS
  Filled 2013-05-16: qty 5

## 2013-05-16 NOTE — Patient Instructions (Addendum)
Implanted Port Instructions  An implanted port is a central line that has a round shape and is placed under the skin. It is used for long-term IV (intravenous) access for:  · Medicine.  · Fluids.  · Liquid nutrition, such as TPN (total parenteral nutrition).  · Blood samples.  Ports can be placed:  · In the chest area just below the collarbone (this is the most common place.)  · In the arms.  · In the belly (abdomen) area.  · In the legs.  PARTS OF THE PORT  A port has 2 main parts:  · The reservoir. The reservoir is round, disc-shaped, and will be a small, raised area under your skin.  · The reservoir is the part where a needle is inserted (accessed) to either give medicines or to draw blood.  · The catheter. The catheter is a long, slender tube that extends from the reservoir. The catheter is placed into a large vein.  · Medicine that is inserted into the reservoir goes into the catheter and then into the vein.  INSERTION OF THE PORT  · The port is surgically placed in either an operating room or in a procedural area (interventional radiology).  · Medicine may be given to help you relax during the procedure.  · The skin where the port will be inserted is numbed (local anesthetic).  · 1 or 2 small cuts (incisions) will be made in the skin to insert the port.  · The port can be used after it has been inserted.  INCISION SITE CARE  · The incision site may have small adhesive strips on it. This helps keep the incision site closed. Sometimes, no adhesive strips are placed. Instead of adhesive strips, a special kind of surgical glue is used to keep the incision closed.  · If adhesive strips were placed on the incision sites, do not take them off. They will fall off on their own.  · The incision site may be sore for 1 to 2 days. Pain medicine can help.  · Do not get the incision site wet. Bathe or shower as directed by your caregiver.  · The incision site should heal in 5 to 7 days. A small scar may form after the  incision has healed.  ACCESSING THE PORT  Special steps must be taken to access the port:  · Before the port is accessed, a numbing cream can be placed on the skin. This helps numb the skin over the port site.  · A sterile technique is used to access the port.  · The port is accessed with a needle. Only "non-coring" port needles should be used to access the port. Once the port is accessed, a blood return should be checked. This helps ensure the port is in the vein and is not clogged (clotted).  · If your caregiver believes your port should remain accessed, a clear (transparent) bandage will be placed over the needle site. The bandage and needle will need to be changed every week or as directed by your caregiver.  · Keep the bandage covering the needle clean and dry. Do not get it wet. Follow your caregiver's instructions on how to take a shower or bath when the port is accessed.  · If your port does not need to stay accessed, no bandage is needed over the port.  FLUSHING THE PORT  Flushing the port keeps it from getting clogged. How often the port is flushed depends on:  · If a   constant infusion is running. If a constant infusion is running, the port may not need to be flushed.  · If intermittent medicines are given.  · If the port is not being used.  For intermittent medicines:  · The port will need to be flushed:  · After medicines have been given.  · After blood has been drawn.  · As part of routine maintenance.  · A port is normally flushed with:  · Normal saline.  · Heparin.  · Follow your caregiver's advice on how often, how much, and the type of flush to use on your port.  IMPORTANT PORT INFORMATION  · Tell your caregiver if you are allergic to heparin.  · After your port is placed, you will get a manufacturer's information card. The card has information about your port. Keep this card with you at all times.  · There are many types of ports available. Know what kind of port you have.  · In case of an  emergency, it may be helpful to wear a medical alert bracelet. This can help alert health care workers that you have a port.  · The port can stay in for as long as your caregiver believes it is necessary.  · When it is time for the port to come out, surgery will be done to remove it. The surgery will be similar to how the port was put in.  · If you are in the hospital or clinic:  · Your port will be taken care of and flushed by a nurse.  · If you are at home:  · A home health care nurse may give medicines and take care of the port.  · You or a family member can get special training and directions for giving medicine and taking care of the port at home.  SEEK IMMEDIATE MEDICAL CARE IF:   · Your port does not flush or you are unable to get a blood return.  · New drainage or pus is coming from the incision.  · A bad smell is coming from the incision site.  · You develop swelling or increased redness at the incision site.  · You develop increased swelling or pain at the port site.  · You develop swelling or pain in the surrounding skin near the port.  · You have an oral temperature above 102° F (38.9° C), not controlled by medicine.  MAKE SURE YOU:   · Understand these instructions.  · Will watch your condition.  · Will get help right away if you are not doing well or get worse.  Document Released: 11/15/2005 Document Revised: 02/07/2012 Document Reviewed: 02/06/2009  [employer]® Patient Information ©2014 [employer].

## 2013-05-25 ENCOUNTER — Ambulatory Visit (HOSPITAL_BASED_OUTPATIENT_CLINIC_OR_DEPARTMENT_OTHER): Payer: Medicare Other | Admitting: Oncology

## 2013-05-25 ENCOUNTER — Telehealth: Payer: Self-pay | Admitting: *Deleted

## 2013-05-25 ENCOUNTER — Telehealth: Payer: Self-pay | Admitting: Oncology

## 2013-05-25 VITALS — BP 125/69 | HR 73 | Temp 96.9°F | Resp 18 | Ht 60.0 in | Wt 210.8 lb

## 2013-05-25 DIAGNOSIS — C189 Malignant neoplasm of colon, unspecified: Secondary | ICD-10-CM

## 2013-05-25 NOTE — Progress Notes (Signed)
[facility]    OFFICE PROGRESS NOTE   INTERVAL HISTORY:   She returns as scheduled. She has intermittent "dizzy "spells. No other specific complaint. Her daughter reports that Ms. Kosar eats mostly in the evening.  Objective:  Vital signs in last 24 hours:  Blood pressure 125/69, pulse 73, temperature 96.9 F (36.1 C), temperature source Oral, resp. rate 18, height 5' (1.524 m), weight 210 lb 12.8 oz (95.618 kg).    HEENT: Neck without mass Resp: Lungs clear bilaterally Cardio: Regular rate and rhythm GI: No hepatomegaly, no mass Vascular: No leg edema   Portacath/PICC-without erythema  Lab Results:  Lab Results  Component Value Date   WBC 6.4 05/16/2013   HGB 9.9* 05/16/2013   HCT 28.8* 05/16/2013   MCV 96.4 05/16/2013   PLT 195 05/16/2013   ANC 4.1  CEA 8.4  Medications: I have reviewed the patient's current medications.  Assessment/Plan: 1. Metastatic colon cancer presenting with synchronous colon primaries in September, 2008, status post a hemicolectomy and liver biopsy 08/03/2007. A CT on 04/20/2010 revealed no evidence metastatic disease. She was maintained on 5-FU/leucovorin per the FOLFOX regimen beginning in April, 2009. Avasta was discontinued from the chemotherapy regimen beginning in November, 2009 due to nephrotic range proteinuria. A CT of the abdomen and pelvis 02/05/2011 revealed evidence of three small liver lesions. An MRI of the abdomen on 03/02/2011 confirmed 3 liver lesions and no additional evidence of metastatic disease. K-RAS testing on the transverse colon and left colon tumors revealed a wild type genotype.  1A. Staging PET scan 03/21/2011 confirmed 3 hypermetabolic liver lesions and no additional evidence of metastatic disease.  1B. Surgical consultation by Dr. Donell Beers was obtained and Ms. Kisiel decided against an attempt at surgical resection/radiofrequency ablation.  1C. Salvage therapy with FOLFOX was initiated 04/13/2011.  1D.  A restaging CT 06/28/2011 revealed a significant improvement in the hepatic metastasis.  1E. She completed eight cycles of FOLFOX chemotherapy. Treatment was switched to 5FU-leucovorin as per the FOLFOX regimen beginning with cycle #9 due to an allergic reaction (skin rash) following cycle #8.  77F. Restaging CT 10/25/2011 revealed 2 more conspicuous liver metastases .  1G. Restaging PET scan 12/24/2011 revealed an increase in the size and metabolic activity of 3 liver metastases  1H. She began FOLFIRI chemotherapy on 01/19/2012.  1 I. Restaging CT of the abdomen 05/08/2012 revealed a mixed response with enlargement of 2 liver lesions and a decrease in the size of a third lesion, no new lesions.  1J. FOLFIRI chemotherapy continued with the addition of Avastin beginning 05/09/2012.  1K. restaging CT 07/28/2012 with no new liver lesions and a decreased size of the previously noted 3 liver lesions.  1L. She was last treated with FOLFIRI/Avastin on 09/12/2012.  52M. She completed stereotactic radiotherapy to 3 liver metastases 10/10/2012 through 10/20/2012.  1N. Follow-up MRI of the abdomen 11/24/2012 showed improvement in the 3 hepatic metastases and no progressive disease.  1O. Restaging MRI 04/09/2013 showed improvement in the 3 treated hepatic lesions and 2 new lesions. No evidence of metastatic disease outside of liver  1. Diabetes. 2. History of proteinuria secondary to Avastin versus diabetes. 3. Mucositis and hand foot syndrome, secondary to 5FU improved with a dose reduction of the 5-FU. 4. Anemia secondary to chemotherapy.  5. Left back and left leg pain with an MRI of the lumbar spine confirming lumbar disk disease. She is status post surgery by Dr. Franky Macho 02/14/2008 with resolution of the back and  left leg pain. 6. Rosacea. She is followed by dermatology. A recent flare, now maintained on doxycycline 7. Depression, maintained on Cymbalta-she requested a referral to Dr. Noe Gens 8. History of  bilateral knee pain secondary to arthritis -- she takes Vicodin as needed. 9. History of anorexia -- weight loss -- ?Related to depression/anxiety or metastatic colon cancer. 10. Allergic reaction to oxaliplatin with FOLFOX on 04/13/2011. She was able to tolerate FOLFOX on 04/20/2011 with steroid premedication and a prolonged oxaliplatin infusion. After cycle #8 she developed a rash upon returning home with pruritus. She took Benadryl with resolution of symptoms. This reaction occurred despite steroid premedication. 11. Poor dentition -- she has been evaluated by Dr. Dalene Carrow. She has elected to delay recommended tooth extractions. 12. Neutropenia following cycle 1 of FOLFIRI. The FOLFIRI was dose reduced beginning with cycle 2. She was neutropenic 03/14/2012 and chemotherapy was held for one week.  13. Nausea and diarrhea during the irinotecan infusion on 03/21/2012. Symptoms resolved with atropine. She receives atropine as a premedication with each cycle. 14. Diarrhea following the 05/09/2012 chemotherapy cycle. Likely related to irinotecan. She takes Imodium as needed. 15. Diffuse pain 4-5 days after chemotherapy 07/17/2012-likely toxicity from Neulasta. 16. History of a fall with fracture of the right fifth metacarpal.   Disposition:  Ms. Miyazaki appears stable. I discussed treatment options with Ms. Hruska and her daughter. Her case was presented at the GI tumor conference several weeks ago. Further radiation and radiofrequency ablation were not recommended.  She has been treated with FOLFOX, FOLFIRI, and Avastin. She has not received an EGFR inhibitor. Treatment options include repeat treatment with FOLFOX, continuation of FOLFIRI ,irinotecan/panitumumab, and Xeloda.  We discussed the potential toxicities associated with panitumumab and Xeloda. I recommend Xeloda/Avastin. She understands the potential for mucositis, diarrhea, skin hyperpigmentation, rash, and the hand/foot syndrome with Xeloda.  We reviewed the chance of developing proteinuria with Avastin. She agrees to proceed. The plan is to initiate Xeloda/Avastin on a 3 week schedule beginning 05/30/2013. She will return for an office visit prior to cycle 2 on 06/20/2013.   Thornton Papas, MD  05/25/2013  4:48 PM

## 2013-05-25 NOTE — Telephone Encounter (Signed)
Per staff message and POF I have scheduled appts.  JMW  

## 2013-05-28 ENCOUNTER — Other Ambulatory Visit: Payer: Self-pay | Admitting: *Deleted

## 2013-05-28 ENCOUNTER — Encounter: Payer: Self-pay | Admitting: Oncology

## 2013-05-28 MED ORDER — CAPECITABINE 500 MG PO TABS: ORAL_TABLET | ORAL | Status: DC

## 2013-05-28 NOTE — Progress Notes (Signed)
Faxed xeloda prescription to [facility] °

## 2013-05-28 NOTE — Progress Notes (Unsigned)
Verified with pharmacist [facility] [phone]), that patient is currently taking Lipitor 40mg ; last filled 03/01/13; Pravachol 40mg .was discontinued 1/14.

## 2013-05-29 ENCOUNTER — Telehealth: Payer: Self-pay | Admitting: *Deleted

## 2013-05-29 ENCOUNTER — Encounter: Payer: Self-pay | Admitting: Oncology

## 2013-05-29 NOTE — Progress Notes (Signed)
Called [facility] and per Emily Lawson was approved 05/29/13-05/28/14  [id number]. I will scan to billing and forward to Medical records.

## 2013-05-29 NOTE — Telephone Encounter (Signed)
Message from pt asking if she should come in for chemo even if she does not receive Xeloda in time. Called [facility], Xeloda is scheduled for delivery 05/30/13. Called pt, instructed her to keep appts as scheduled. She voiced understanding.

## 2013-05-30 ENCOUNTER — Ambulatory Visit (HOSPITAL_BASED_OUTPATIENT_CLINIC_OR_DEPARTMENT_OTHER): Payer: Medicare Other

## 2013-05-30 VITALS — BP 130/70 | HR 70 | Temp 98.2°F | Resp 20

## 2013-05-30 DIAGNOSIS — C189 Malignant neoplasm of colon, unspecified: Secondary | ICD-10-CM

## 2013-05-30 DIAGNOSIS — C187 Malignant neoplasm of sigmoid colon: Secondary | ICD-10-CM

## 2013-05-30 DIAGNOSIS — Z5112 Encounter for antineoplastic immunotherapy: Secondary | ICD-10-CM

## 2013-05-30 DIAGNOSIS — C787 Secondary malignant neoplasm of liver and intrahepatic bile duct: Secondary | ICD-10-CM

## 2013-05-30 MED ORDER — SODIUM CHLORIDE 0.9 % IV SOLN
Freq: Once | INTRAVENOUS | Status: AC
  Administered 2013-05-30: 15:00:00 via INTRAVENOUS

## 2013-05-30 MED ORDER — HEPARIN SOD (PORK) LOCK FLUSH 100 UNIT/ML IV SOLN
500.0000 [IU] | Freq: Once | INTRAVENOUS | Status: AC | PRN
  Administered 2013-05-30: 500 [IU]
  Filled 2013-05-30: qty 5

## 2013-05-30 MED ORDER — SODIUM CHLORIDE 0.9 % IV SOLN
7.5000 mg/kg | Freq: Once | INTRAVENOUS | Status: AC
  Administered 2013-05-30: 725 mg via INTRAVENOUS
  Filled 2013-05-30: qty 29

## 2013-05-30 MED ORDER — SODIUM CHLORIDE 0.9 % IJ SOLN
10.0000 mL | INTRAMUSCULAR | Status: DC | PRN
  Administered 2013-05-30: 10 mL
  Filled 2013-05-30: qty 10

## 2013-05-30 NOTE — Patient Instructions (Addendum)
[facility] Discharge Instructions for Patients Receiving Chemotherapy  Today you received the following chemotherapy agents Avastin.  To help prevent nausea and vomiting after your treatment, we encourage you to take your nausea medication as prescribed.   If you develop nausea and vomiting that is not controlled by your nausea medication, call the clinic.   BELOW ARE SYMPTOMS THAT SHOULD BE REPORTED IMMEDIATELY:  *FEVER GREATER THAN 100.5 F  *CHILLS WITH OR WITHOUT FEVER  NAUSEA AND VOMITING THAT IS NOT CONTROLLED WITH YOUR NAUSEA MEDICATION  *UNUSUAL SHORTNESS OF BREATH  *UNUSUAL BRUISING OR BLEEDING  TENDERNESS IN MOUTH AND THROAT WITH OR WITHOUT PRESENCE OF ULCERS  *URINARY PROBLEMS  *BOWEL PROBLEMS  UNUSUAL RASH Items with * indicate a potential emergency and should be followed up as soon as possible.  Feel free to call the clinic you have any questions or concerns. The clinic phone number is [phone].    

## 2013-05-30 NOTE — Telephone Encounter (Signed)
Faxed confirmation from [facility] that Capecitabine was shipped on 05/29/13 for next day delivery.

## 2013-06-12 ENCOUNTER — Other Ambulatory Visit: Payer: Self-pay | Admitting: *Deleted

## 2013-06-12 DIAGNOSIS — C189 Malignant neoplasm of colon, unspecified: Secondary | ICD-10-CM

## 2013-06-12 MED ORDER — CAPECITABINE 500 MG PO TABS: ORAL_TABLET | ORAL | Status: DC

## 2013-06-12 NOTE — Addendum Note (Signed)
Addended by: Arvilla Meres on: 06/12/2013 04:27 PM   Modules accepted: Orders

## 2013-06-12 NOTE — Telephone Encounter (Signed)
THIS REFILL REQUEST FOR CAPECITABINE WAS GIVEN TO DR.SHERRILL'S NURSE, SUSAN COWARD,RN. 

## 2013-06-13 ENCOUNTER — Telehealth: Payer: Self-pay | Admitting: *Deleted

## 2013-06-13 NOTE — Telephone Encounter (Signed)
Message from pt reporting she needs her teeth pulled. States her teeth are loose and she is only able to eat soft foods. Painful to eat/ chew. Stated when she saw Dr. Kristin Bruins  she was told she'd need all of her teeth removed. Asking if this is done free of cost. Prentice Docker, with Dr. Kristin Bruins. They will review chart and contact pt with appt or recommendation.

## 2013-06-14 NOTE — Telephone Encounter (Signed)
RECEIVED A FAX FROM [facility] CONCERNING A CONFIRMATION OF PRESCRIPTION SHIPMENT FOR CAPECITABINE ON 06/13/13. 

## 2013-06-17 ENCOUNTER — Other Ambulatory Visit: Payer: Self-pay | Admitting: Oncology

## 2013-06-18 ENCOUNTER — Ambulatory Visit (HOSPITAL_COMMUNITY): Payer: Self-pay | Admitting: Dentistry

## 2013-06-18 ENCOUNTER — Encounter (HOSPITAL_COMMUNITY): Payer: Self-pay | Admitting: Dentistry

## 2013-06-18 ENCOUNTER — Other Ambulatory Visit: Payer: Self-pay | Admitting: *Deleted

## 2013-06-18 VITALS — BP 123/75 | HR 71 | Temp 98.4°F

## 2013-06-18 DIAGNOSIS — K045 Chronic apical periodontitis: Secondary | ICD-10-CM

## 2013-06-18 DIAGNOSIS — C189 Malignant neoplasm of colon, unspecified: Secondary | ICD-10-CM

## 2013-06-18 DIAGNOSIS — K0889 Other specified disorders of teeth and supporting structures: Secondary | ICD-10-CM

## 2013-06-18 DIAGNOSIS — M264 Malocclusion, unspecified: Secondary | ICD-10-CM

## 2013-06-18 DIAGNOSIS — K08409 Partial loss of teeth, unspecified cause, unspecified class: Secondary | ICD-10-CM

## 2013-06-18 DIAGNOSIS — C801 Malignant (primary) neoplasm, unspecified: Secondary | ICD-10-CM

## 2013-06-18 DIAGNOSIS — C787 Secondary malignant neoplasm of liver and intrahepatic bile duct: Secondary | ICD-10-CM

## 2013-06-18 DIAGNOSIS — K0530 Chronic periodontitis, unspecified: Secondary | ICD-10-CM

## 2013-06-18 NOTE — Patient Instructions (Signed)
The patient is scheduled for operating room procedure on Wednesday, 07/04/2013 at 8:30 AM at [place] long. The patient will be scheduled for presurgical testing appointment procedures by [facility] long short stay. Patient is to keep appointment with Dr. Truett Perna for this coming Wednesday, but no Avastin chemotherapy will be given.  Patient to call if problems arise.  Charlynne Pander, DDS

## 2013-06-18 NOTE — Progress Notes (Signed)
06/18/2013  Patient:            Emily Lawson Date of Birth:  [birth year] MRN:                [id number]  BP 123/75  Pulse 71  Temp(Src) 98.4 F (36.9 C)  Emily Lawson is a 77 year old female that presents for periodic oral exam. Patient was previously evaluated 2012 with a history of metastatic colon cancer and active chemotherapy. Patient did not followup with dental treatment at that time and now wishes to be evaluated for possible removal of all remaining teeth with alveoloplasty as indicated.  Patient Active Problem List   Diagnosis Date Noted  . Colon cancer   . Cancer   . Diabetes mellitus   . Malignant neoplasm of colon, unspecified site 10/11/2011  . Secondary malignant neoplasm of liver(197.7) 10/11/2011  . Chest pain 08/27/2011  . Fatigue 08/27/2011  . Dyslipidemia 08/27/2011  . Hypertension 08/27/2011    Medical Hx Update:  Past Medical History  Diagnosis Date  . Metastases to the liver dx'd 2008    chemo ongoing  . Hypertension   . Arthritis   . Heart murmur   . Chills   . Hearing loss   . Leg swelling   . Nausea   . Weakness   . Nausea   . Colon cancer dx'd 2008    hemicolectomy, liver bx 08/03/07  . Diabetes mellitus   . Anemia     2ndary to chemo  . Rosacea     hx of  . Anorexia     hx of  . Anxiety   . Allergy     avandia/oxaliplatin  . Cancer 02/05/11 CT abd/pelvis    colon primary met to liver  . History of radiation therapy 10/10/2012-10/20/2012    3 liver metastases  . ALLERGIES/ADVERSE DRUG REACTIONS: Allergies  Allergen Reactions  . Avandia (Rosiglitazone Maleate)     Hair loss  . Oxaliplatin Rash    With FOLFOX, 04/13/11, tol w/premeds on 04/20/11   MEDICATIONS: Current Outpatient Prescriptions  Medication Sig Dispense Refill  . alendronate (FOSAMAX) 70 MG tablet Take 70 mg by mouth Once a week. Will begin 03/14/12      . amLODipine (NORVASC) 5 MG tablet Take 1 tablet by mouth Daily.       Marland Kitchen aspirin 81 MG tablet Take 81 mg by mouth 2  (two) times daily.       Marland Kitchen atorvastatin (LIPITOR) 40 MG tablet Take 40 mg by mouth.       . capecitabine (XELODA) 500 MG tablet Take 4 tablets (2,000 mg) Q AM 3 tablets (1,500 mg) Q PM = 3500 mg daily for 14 days  98 tablet  1  . Cholecalciferol (VITAMIN D-3 PO) Take 5,000 Units by mouth daily.       . CYMBALTA 60 MG capsule Take 60 mg by mouth at bedtime.       Marland Kitchen glipiZIDE (GLUCOTROL XL) 10 MG 24 hr tablet Take 10 mg by mouth daily.       Marland Kitchen glucosamine-chondroitin 500-400 MG tablet Take 1 tablet by mouth 2 (two) times daily.      . hydrochlorothiazide (HYDRODIURIL) 25 MG tablet Take 25 mg by mouth daily.       Marland Kitchen lidocaine-prilocaine (EMLA) cream APPLY TO PAC SITE 1 HOUR PRIOR TO STICK, COVER WITH PLASTIC  30 g  PRN  . lisinopril (PRINIVIL,ZESTRIL) 20 MG tablet Take 20 mg by mouth daily.       Marland Kitchen  metFORMIN (GLUCOPHAGE) 1000 MG tablet Take 1,000 mg by mouth 2 (two) times daily with a meal.       . pantoprazole (PROTONIX) 40 MG tablet Take 40 mg by mouth daily.       Marland Kitchen acetaminophen (TYLENOL) 500 MG tablet Take 500 mg by mouth every 6 (six) hours as needed.        . traMADol (ULTRAM) 50 MG tablet Take 50 mg by mouth every 6 (six) hours as needed. Has not started taking this medication       No current facility-administered medications for this visit.   Facility-Administered Medications Ordered in Other Visits  Medication Dose Route Frequency Provider Last Rate Last Dose  . fluorouracil (ADRUCIL) 3,000 mg in sodium chloride 0.9 % 150 mL chemo infusion  1,500 mg/m2 (Treatment Plan Actual) Intravenous 1 day or 1 dose Ladene Artist, MD   3,000 mg at 07/04/12 1542    C/C:  " I want to have my teeth pulled."  HPI:  Emily Lawson is a 77 year old female that presents for periodic oral exam and evaluation for dental treatemnt. Patient was previously evaluated 2012 with a history of metastatic colon cancer and active chemotherapy. Patient did not proceed with dental treatment at that time and now  wishes to be evaluated for possible removal of all remaining teeth with alveoloplasty as indicated.   DENTAL EXAM: General: Patient is a well-developed, well-nourished female in no acute distress. Vitals: BP 123/75  Pulse 71  Temp(Src) 98.4 F (36.9 C) Extraoral Exam: No lymphadenopathy.  No TMJ Symptoms but a history of left TMJ crepitus. Intraoral  Exam:  Normal Saliva. No soft tissue lesions or abscesses. Dentition: The patient is missing tooth numbers 1, 2, 3, 4, 5, 6, 14, 15, 16, 17, 18, 19, 24, 30, and 32. Periodontal: The patient has chronic, advanced periodontal disease with plaque and calculus accumulations, generalized gingival recession and generalized extensive tooth mobility as per dental charting form. Patient with a history of periodontal splinting of mandibular anterior teeth 22-26. The splint is fractured at this time. All these teeth are multiple from tooth numbers 22 through 26. Caries: Please see dental charting form. Endodontic: Patient currently denies acute pulpitits symptoms. Patient has multiple periapical radiolucencies.  Patient has had a history of toothache symptoms from time to time. C&B: Patient has multiple crown restorations t some of these are suboptimal in the area to tooth mobility and chronic periodontitis.  Several crowns have fractured porcelain. Prosthodontic: Patient with a history of a maxillary cast partial denture. Patient has never had a lower partial denture. Patient no longer wears the upper partial denture. Occlusion: Patient with a poor occlusal scheme secondary to multiple missing teeth, he'll fitting maxillary cast partial denture, lack of mandibular partial denture, and lack of replacement of the missing teeth with clinically acceptable dental prostheses. Radiographic Interpretation: An orthopantogram was taken today. There are multiple missing teeth. There is moderate to severe bone loss noted. There are multiple areas of periapical pathology  and radiolucency. there is supra-eruption and drifting of the unopposed teeth into the edentulous areas. There is pneumatization of the maxillary sinuses. Extensive crown or bridge restorations and root canal therapies are noted. Assessments: 1. Chronic periodontitis of bone loss 2. I can calculus accumulations 3. Gingival recession 4. Tooth mobility as per dental charting form 5. Multiple missing teeth 6. Supra-eruption and drifting of the unopposed teeth into the edentulous areas 7  History of ill fitting maxillary partial denture 8.  Poor occlusal scheme 9. Significant gag reflex 10. History of periodontal splinting of the mandibular anterior teeth as not effective than mobile at this time. 11. Current active chemotherapy with the potential risk for bleeding and infection with invasive dental procedures 12. Current Avastin therapy with the risk for bleeding and delayed healing with invasive dental procedures 13. Current oral Fosamax therapy with the risk for osteonecrosis of the jaw related to the bisphosphonate therapy.  Plan/recommendations: 1. discussed the risks, benefits, and complications of various treatment options for the patient in relationship to her medical and dental conditions, active chemotherapy, use of Avastin chemotherapy, and use of Fosamax with risk for osteonecrosis of the jaw.  We discussed no treatment, extraction of remaining teeth with alveoloplasty, pre-prosthetic surgery as indicated, periodontal therapy, dental restorations, root canal therapy, crown and bridge therapy, implant therapy, and replacement of missing teeth is indicated after adequate healing.  The patient currently wishes to proceed with extraction of all remaining teeth with alveoloplasty as indicated in the operating room with general anesthesia. Per discussion with Dr. Truett Perna, it was determined that the Avastin chemotherapy would be held from this point on with extractions scheduled for 07/04/2013 at  8:30 AM at [facility] long.  The Avastin will then be continued approximately 2-3 weeks after the extractions to minimize problem of delayed healing.  The patient will then follow the dentist of her choice for fabrication of upper and lower complete dentures after adequate healing.  2. Discussion of findings and coordination of future medical and dental treatment as indicated.  Charlynne Pander, DDS

## 2013-06-19 ENCOUNTER — Telehealth: Payer: Self-pay | Admitting: Oncology

## 2013-06-19 NOTE — Telephone Encounter (Signed)
Talked to pt and gave her appt for lab, ML and chemo

## 2013-06-20 ENCOUNTER — Other Ambulatory Visit: Payer: Medicare Other | Admitting: Lab

## 2013-06-20 ENCOUNTER — Ambulatory Visit: Payer: Medicare Other

## 2013-06-20 ENCOUNTER — Telehealth: Payer: Self-pay | Admitting: Oncology

## 2013-06-20 ENCOUNTER — Ambulatory Visit: Payer: Medicare Other | Admitting: Oncology

## 2013-06-20 ENCOUNTER — Ambulatory Visit: Payer: Medicare Other | Admitting: Nurse Practitioner

## 2013-06-20 ENCOUNTER — Other Ambulatory Visit (HOSPITAL_BASED_OUTPATIENT_CLINIC_OR_DEPARTMENT_OTHER): Payer: Medicare Other | Admitting: Lab

## 2013-06-20 ENCOUNTER — Ambulatory Visit (HOSPITAL_BASED_OUTPATIENT_CLINIC_OR_DEPARTMENT_OTHER): Payer: Medicare Other | Admitting: Nurse Practitioner

## 2013-06-20 VITALS — BP 139/70 | HR 74 | Temp 97.0°F | Resp 17 | Ht 60.0 in | Wt 209.0 lb

## 2013-06-20 DIAGNOSIS — C189 Malignant neoplasm of colon, unspecified: Secondary | ICD-10-CM

## 2013-06-20 LAB — CBC WITH DIFFERENTIAL/PLATELET
BASO%: 0.4 % (ref 0.0–2.0)
Basophils Absolute: 0 10*3/uL (ref 0.0–0.1)
EOS%: 4.6 % (ref 0.0–7.0)
HGB: 9.3 g/dL — ABNORMAL LOW (ref 11.6–15.9)
MCH: 32.5 pg (ref 25.1–34.0)
RBC: 2.86 10*6/uL — ABNORMAL LOW (ref 3.70–5.45)
RDW: 18.6 % — ABNORMAL HIGH (ref 11.2–14.5)
lymph#: 1.3 10*3/uL (ref 0.9–3.3)
nRBC: 0 % (ref 0–0)

## 2013-06-20 NOTE — Telephone Encounter (Signed)
gv and printed avs for pt °

## 2013-06-20 NOTE — Progress Notes (Signed)
OFFICE PROGRESS NOTE  Interval history:  Emily Lawson returns as scheduled. She completed cycle 1 Xeloda/Avastin beginning 05/30/2013. She had 2 episodes of nausea with vomiting. Her tongue became sore. She developed a single lip ulceration. She has periodic loose stools. She noted blood with a bowel movement recently. She has had several bowel movements since then with no bleeding. She denies any other bleeding. No hand or foot pain or redness. She denies abdominal pain. She has chronic back pain. She reports intermittent dizziness. She associates the dizziness with Xeloda. Her daughter notes she is sleeping more.  Objective: Blood pressure 139/70, pulse 74, temperature 97 F (36.1 C), temperature source Oral, resp. rate 17, height 5' (1.524 m), weight 209 lb (94.802 kg), SpO2 100.00%.  Small ulceration at the left side of the lower lip. No ulcerations within the oral cavity. Lungs are clear. Regular cardiac rhythm. Port-A-Cath site is without erythema. Abdomen is soft. No hepatomegaly. Extremities without edema. Palms with mild erythema at the base of the thumbs bilaterally.  Lab Results: Lab Results  Component Value Date   WBC 5.7 06/20/2013   HGB 9.3* 06/20/2013   HCT 28.3* 06/20/2013   MCV 99.0 06/20/2013   PLT 182 06/20/2013    Chemistry:    Chemistry      Component Value Date/Time   NA 139 05/16/2013 1030   NA 135 07/17/2012 1058   K 4.1 05/16/2013 1030   K 4.7 07/17/2012 1058   CL 106 05/16/2013 1030   CL 103 07/17/2012 1058   CO2 21* 05/16/2013 1030   CO2 18* 07/17/2012 1058   BUN 23.5 05/16/2013 1030   BUN 21 07/17/2012 1058   CREATININE 1.1 05/16/2013 1030   CREATININE 1.26* 07/17/2012 1058   CREATININE 1.29* 05/20/2009 1423   GLU 102* 10/19/2007 1433      Component Value Date/Time   CALCIUM 9.5 05/16/2013 1030   CALCIUM 8.7 07/17/2012 1058   ALKPHOS 244 Repeated and Verified* 05/16/2013 1030   ALKPHOS 85 07/17/2012 1058   AST 33 05/16/2013 1030   AST 17 07/17/2012 1058   ALT 29  05/16/2013 1030   ALT 13 07/17/2012 1058   BILITOT 1.02 05/16/2013 1030   BILITOT 0.4 07/17/2012 1058       Studies/Results: No results found.  Medications: I have reviewed the patient's current medications.  Assessment/Plan:  1. Metastatic colon cancer presenting with synchronous colon primaries in September, 2008, status post a hemicolectomy and liver biopsy 08/03/2007. A CT on 04/20/2010 revealed no evidence metastatic disease. She was maintained on 5-FU/leucovorin per the FOLFOX regimen beginning in April, 2009. Avasta was discontinued from the chemotherapy regimen beginning in November, 2009 due to nephrotic range proteinuria. A CT of the abdomen and pelvis 02/05/2011 revealed evidence of three small liver lesions. An MRI of the abdomen on 03/02/2011 confirmed 3 liver lesions and no additional evidence of metastatic disease. K-RAS testing on the transverse colon and left colon tumors revealed a wild type genotype.  1A. Staging PET scan 03/21/2011 confirmed 3 hypermetabolic liver lesions and no additional evidence of metastatic disease.  1B. Surgical consultation by Dr. Donell Beers was obtained and Ms. Schlageter decided against an attempt at surgical resection/radiofrequency ablation.  1C. Salvage therapy with FOLFOX was initiated 04/13/2011.  1D. A restaging CT 06/28/2011 revealed a significant improvement in the hepatic metastasis.  1E. She completed eight cycles of FOLFOX chemotherapy. Treatment was switched to 5FU-leucovorin as per the FOLFOX regimen beginning with cycle #9 due to an allergic reaction (skin rash)  following cycle #8.  35F. Restaging CT 10/25/2011 revealed 2 more conspicuous liver metastases .  1G. Restaging PET scan 12/24/2011 revealed an increase in the size and metabolic activity of 3 liver metastases  1H. She began FOLFIRI chemotherapy on 01/19/2012.  1 I. Restaging CT of the abdomen 05/08/2012 revealed a mixed response with enlargement of 2 liver lesions and a decrease in the  size of a third lesion, no new lesions.  1J. FOLFIRI chemotherapy continued with the addition of Avastin beginning 05/09/2012.  1K. restaging CT 07/28/2012 with no new liver lesions and a decreased size of the previously noted 3 liver lesions.  1L. She was last treated with FOLFIRI/Avastin on 09/12/2012.  48M. She completed stereotactic radiotherapy to 3 liver metastases 10/10/2012 through 10/20/2012.  1N. Follow-up MRI of the abdomen 11/24/2012 showed improvement in the 3 hepatic metastases and no progressive disease.  1O. Restaging MRI 04/09/2013 showed improvement in the 3 treated hepatic lesions and 2 new lesions. No evidence of metastatic disease outside of liver. 1P. Initiation of Xeloda/Avastin 05/30/2013.  1. Diabetes. 2. History of proteinuria secondary to Avastin versus diabetes. 3. Mucositis and hand foot syndrome, secondary to 5FU improved with a dose reduction of the 5-FU. 4. Anemia secondary to chemotherapy.  5. Left back and left leg pain with an MRI of the lumbar spine confirming lumbar disk disease. She is status post surgery by Dr. Franky Macho 02/14/2008 with resolution of the back and left leg pain. 6. Rosacea. She is followed by dermatology. A recent flare, now maintained on doxycycline 7. Depression, maintained on Cymbalta-she requested a referral to Dr. Noe Gens 8. History of bilateral knee pain secondary to arthritis -- she takes Vicodin as needed. 9. History of anorexia -- weight loss -- ?Related to depression/anxiety or metastatic colon cancer. 10. Allergic reaction to oxaliplatin with FOLFOX on 04/13/2011. She was able to tolerate FOLFOX on 04/20/2011 with steroid premedication and a prolonged oxaliplatin infusion. After cycle #8 she developed a rash upon returning home with pruritus. She took Benadryl with resolution of symptoms. This reaction occurred despite steroid premedication. 11. Poor dentition -- she has been evaluated by Dr. Kristin Bruins. She saw Dr. Kristin Bruins recently and  is scheduled for tooth extractions 07/04/2013.  12. Neutropenia following cycle 1 of FOLFIRI. The FOLFIRI was dose reduced beginning with cycle 2. She was neutropenic 03/14/2012 and chemotherapy was held for one week.  13. Nausea and diarrhea during the irinotecan infusion on 03/21/2012. Symptoms resolved with atropine. She receives atropine as a premedication with each cycle. 14. Diarrhea following the 05/09/2012 chemotherapy cycle. Likely related to irinotecan. She takes Imodium as needed. 15. Diffuse pain 4-5 days after chemotherapy 07/17/2012-likely toxicity from Neulasta. 16. History of a fall with fracture of the right fifth metacarpal. 17. Intermittent "dizziness". She did not experience dizziness the week she was off of Xeloda. She will contact the office if the dizziness occurs with cycle 2.  Disposition-Ms. Scheck appears stable. She has completed one cycle of Xeloda/Avastin. We are placing further Avastin on hold pending the upcoming dental surgery. She will proceed with cycle 2 Xeloda today as scheduled. We will see her in followup in 3 weeks. She will contact the office in the interim as outlined above or with any other problems.  Plan reviewed with Dr. Truett Perna.  Lonna Cobb ANP/GNP-BC

## 2013-06-28 ENCOUNTER — Other Ambulatory Visit (HOSPITAL_COMMUNITY): Payer: Self-pay | Admitting: Dentistry

## 2013-06-28 ENCOUNTER — Encounter (HOSPITAL_COMMUNITY): Payer: Self-pay | Admitting: Pharmacy Technician

## 2013-06-28 NOTE — Patient Instructions (Signed)
22 Emily Lawson  06/28/2013   Your procedure is scheduled on:  07/04/13  [facility]  Report to [facility] at   0630    AM.  Call this number if you have problems the morning of surgery: [phone]       Remember: DO NOT TAKE ANY DIABETES MEDICINE MORNING OF SURGERY  Do not eat food  Or drink :After Midnight. Tuesday NIGHT   Take these medicines the morning of surgery with A SIP OF WATER:   .  Contacts, dentures or partial plates can not be worn to surgery  Leave suitcase in the car. After surgery it may be brought to your room.  For patients admitted to the hospital, checkout time is 11:00 AM day of  discharge.             SPECIAL INSTRUCTIONS- SEE [facility] PREPARING FOR SURGERY INSTRUCTION SHEET-     DO NOT WEAR JEWELRY, LOTIONS, POWDERS, OR PERFUMES.  WOMEN-- DO NOT SHAVE LEGS OR UNDERARMS FOR 12 HOURS BEFORE SHOWERS. MEN MAY SHAVE FACE.  Patients discharged the day of surgery will not be allowed to drive home. IF going home the day of surgery, you must have a driver and someone to stay with you for the first 24 hours  Name and phone number of your driver:                                                                                                                                                         Alexius Hangartner  PST [phone]                 FAILURE TO FOLLOW THESE INSTRUCTIONS MAY RESULT IN  CANCELLATION   OF YOUR SURGERY                                                  Patient Signature _____________________________

## 2013-06-29 ENCOUNTER — Inpatient Hospital Stay (HOSPITAL_COMMUNITY)
Admission: RE | Admit: 2013-06-29 | Discharge: 2013-06-29 | Disposition: A | Discharge status: Home / Self Care | Payer: Self-pay | Source: Ambulatory Visit

## 2013-06-29 ENCOUNTER — Telehealth: Payer: Self-pay | Admitting: *Deleted

## 2013-06-29 NOTE — Telephone Encounter (Signed)
Call from pt reporting pain in hands and feet. Returned call, she reports erythema without desquamation. Painful to walk. Reviewed with Misty Stanley, NP: Order received to STOP Xeloda. Instructed pt to HOLD Xeloda for the remainder of this cycle. (Cycle 2 D10) She voiced understanding. Instructed pt to keep appt 8/13 as scheduled. She voiced understanding.

## 2013-07-02 NOTE — Patient Instructions (Addendum)
20 KENSINGTON RIOS  07/02/2013   Your procedure is scheduled on: 07/04/13   Report to [facility] at 6:30 AM.  Call this number if you have problems the morning of surgery [phone]   Remember:   Do not eat food or drink liquids After Midnight.     Take these medicines the morning of surgery with A SIP OF WATER: amlodipine, protonix, lipitor   Do not wear jewelry, make-up or nail polish.  Do not wear lotions, powders, or perfumes. You may wear deodorant.  Do not shave 48 hours prior to surgery. Men may shave face and neck.  Do not bring valuables to the hospital.  Contacts, dentures or bridgework may not be worn into surgery.     Patients discharged the day of surgery will not be allowed to drive home.  Name and phone number of your driver: [place] [phone]     Birdie Sons, RN  pre op nurse call if needed [phone]    FAILURE TO FOLLOW THESE INSTRUCTIONS MAY RESULT IN CANCELLATION OF YOUR SURGERY   Patient Signature: ___________________________________________

## 2013-07-03 ENCOUNTER — Ambulatory Visit (HOSPITAL_COMMUNITY)
Admission: RE | Admit: 2013-07-03 | Discharge: 2013-07-03 | Disposition: A | Discharge status: Home / Self Care | Payer: Medicare Other | Source: Ambulatory Visit | Attending: Dentistry | Admitting: Dentistry

## 2013-07-03 ENCOUNTER — Encounter (HOSPITAL_COMMUNITY)
Admission: RE | Admit: 2013-07-03 | Discharge: 2013-07-03 | Disposition: A | Discharge status: Home / Self Care | Payer: Medicare Other | Source: Ambulatory Visit | Attending: Dentistry | Admitting: Dentistry

## 2013-07-03 ENCOUNTER — Encounter (HOSPITAL_COMMUNITY): Payer: Self-pay

## 2013-07-03 HISTORY — DX: Adverse effect of unspecified anesthetic, initial encounter: T41.45XA

## 2013-07-03 HISTORY — DX: Other complications of anesthesia, initial encounter: T88.59XA

## 2013-07-03 HISTORY — DX: Gastro-esophageal reflux disease without esophagitis: K21.9

## 2013-07-03 HISTORY — DX: Sleep apnea, unspecified: G47.30

## 2013-07-03 HISTORY — DX: Headache: R51

## 2013-07-03 HISTORY — DX: Major depressive disorder, single episode, unspecified: F32.9

## 2013-07-03 HISTORY — DX: Angina pectoris, unspecified: I20.9

## 2013-07-03 HISTORY — DX: Depression, unspecified: F32.A

## 2013-07-03 HISTORY — DX: Other specified disorders of nose and nasal sinuses: J34.89

## 2013-07-03 HISTORY — DX: Pure hypercholesterolemia, unspecified: E78.00

## 2013-07-03 LAB — CBC
Hemoglobin: 9.6 g/dL — ABNORMAL LOW (ref 12.0–15.0)
Platelets: 234 10*3/uL (ref 150–400)
RBC: 2.82 MIL/uL — ABNORMAL LOW (ref 3.87–5.11)
WBC: 6.8 10*3/uL (ref 4.0–10.5)

## 2013-07-03 LAB — BASIC METABOLIC PANEL
GFR calc Af Amer: 50 mL/min — ABNORMAL LOW (ref >90)
GFR calc non Af Amer: 44 mL/min — ABNORMAL LOW (ref >90)
Potassium: 3.8 mEq/L (ref 3.5–5.1)
Sodium: 136 mEq/L (ref 135–145)

## 2013-07-03 NOTE — Anesthesia Preprocedure Evaluation (Addendum)
Anesthesia Evaluation  Patient identified by MRN, date of birth, ID band Patient awake    Reviewed: Allergy & Precautions, H&P , NPO status , Patient's Chart, lab work & pertinent test results  Airway Mallampati: III TM Distance: >3 FB Neck ROM: Full    Dental  (+) Poor Dentition, Loose, Chipped, Missing and Dental Advisory Given   Pulmonary sleep apnea ,  breath sounds clear to auscultation  Pulmonary exam normal       Cardiovascular hypertension, Pt. on medications + angina + Valvular Problems/Murmurs Rhythm:Regular Rate:Normal     Neuro/Psych  Headaches, Anxiety Depression    GI/Hepatic Neg liver ROS, GERD-  Medicated,Metastatic colon CA   Endo/Other  diabetes, Poorly Controlled, Type 2, Oral Hypoglycemic Agents  Renal/GU negative Renal ROS  negative genitourinary   Musculoskeletal negative musculoskeletal ROS (+)   Abdominal   Peds  Hematology negative hematology ROS (+)   Anesthesia Other Findings   Reproductive/Obstetrics                          Anesthesia Physical Anesthesia Plan  ASA: III  Anesthesia Plan: General   Post-op Pain Management:    Induction: Intravenous  Airway Management Planned: Nasal ETT  Additional Equipment:   Intra-op Plan:   Post-operative Plan: Extubation in OR  Informed Consent: I have reviewed the patients History and Physical, chart, labs and discussed the procedure including the risks, benefits and alternatives for the proposed anesthesia with the patient or authorized representative who has indicated his/her understanding and acceptance.   Dental advisory given  Plan Discussed with: CRNA  Anesthesia Plan Comments:         Anesthesia Quick Evaluation

## 2013-07-03 NOTE — Progress Notes (Signed)
Talked to Dr. Smith Mince with anesthesia, will see pt on day of surgery.

## 2013-07-04 ENCOUNTER — Encounter (HOSPITAL_COMMUNITY): Payer: Self-pay | Admitting: *Deleted

## 2013-07-04 ENCOUNTER — Ambulatory Visit (HOSPITAL_COMMUNITY): Payer: Medicare Other | Admitting: Anesthesiology

## 2013-07-04 ENCOUNTER — Encounter (HOSPITAL_COMMUNITY): Payer: Self-pay | Admitting: Anesthesiology

## 2013-07-04 ENCOUNTER — Encounter (HOSPITAL_COMMUNITY)
Admission: RE | Disposition: A | Discharge status: Home / Self Care | Payer: Self-pay | Source: Ambulatory Visit | Attending: Dentistry

## 2013-07-04 ENCOUNTER — Ambulatory Visit (HOSPITAL_COMMUNITY)
Admission: RE | Admit: 2013-07-04 | Discharge: 2013-07-04 | Disposition: A | Discharge status: Home / Self Care | Payer: Medicare Other | Source: Ambulatory Visit | Attending: Dentistry | Admitting: Dentistry

## 2013-07-04 ENCOUNTER — Other Ambulatory Visit (HOSPITAL_COMMUNITY): Payer: Self-pay | Admitting: Dentistry

## 2013-07-04 ENCOUNTER — Other Ambulatory Visit (HOSPITAL_COMMUNITY): Payer: Self-pay

## 2013-07-04 DIAGNOSIS — C801 Malignant (primary) neoplasm, unspecified: Secondary | ICD-10-CM

## 2013-07-04 DIAGNOSIS — K0889 Other specified disorders of teeth and supporting structures: Secondary | ICD-10-CM | POA: Diagnosis present

## 2013-07-04 DIAGNOSIS — K009 Disorder of tooth development, unspecified: Secondary | ICD-10-CM

## 2013-07-04 DIAGNOSIS — K0530 Chronic periodontitis, unspecified: Secondary | ICD-10-CM | POA: Diagnosis present

## 2013-07-04 DIAGNOSIS — K045 Chronic apical periodontitis: Secondary | ICD-10-CM | POA: Insufficient documentation

## 2013-07-04 DIAGNOSIS — M264 Malocclusion, unspecified: Secondary | ICD-10-CM | POA: Insufficient documentation

## 2013-07-04 DIAGNOSIS — C189 Malignant neoplasm of colon, unspecified: Secondary | ICD-10-CM | POA: Insufficient documentation

## 2013-07-04 HISTORY — PX: MULTIPLE EXTRACTIONS WITH ALVEOLOPLASTY: SHX5342

## 2013-07-04 LAB — GLUCOSE, CAPILLARY: Glucose-Capillary: 164 mg/dL — ABNORMAL HIGH (ref 70–99)

## 2013-07-04 SURGERY — MULTIPLE EXTRACTION WITH ALVEOLOPLASTY
Anesthesia: General | Site: Mouth | Wound class: Clean Contaminated

## 2013-07-04 MED ORDER — HYDROCODONE-ACETAMINOPHEN 7.5-325 MG/15ML PO SOLN
15.0000 mL | Freq: Four times a day (QID) | ORAL | Status: AC | PRN
Start: 2013-07-04 — End: 2014-07-04

## 2013-07-04 MED ORDER — LACTATED RINGERS IV SOLN
INTRAVENOUS | Status: DC
Start: 2013-07-04 — End: 2013-07-04

## 2013-07-04 MED ORDER — LIDOCAINE-EPINEPHRINE 2 %-1:100000 IJ SOLN
INTRAMUSCULAR | Status: DC | PRN
  Administered 2013-07-04: 6.8 mL

## 2013-07-04 MED ORDER — SUCCINYLCHOLINE CHLORIDE 20 MG/ML IJ SOLN
INTRAMUSCULAR | Status: DC | PRN
  Administered 2013-07-04: 100 mg via INTRAVENOUS

## 2013-07-04 MED ORDER — INSULIN ASPART 100 UNIT/ML ~~LOC~~ SOLN
0.0000 [IU] | SUBCUTANEOUS | Status: DC
  Administered 2013-07-04: 5 [IU] via SUBCUTANEOUS

## 2013-07-04 MED ORDER — OXYMETAZOLINE HCL 0.05 % NA SOLN
NASAL | Status: AC
  Filled 2013-07-04: qty 15

## 2013-07-04 MED ORDER — HYDROMORPHONE HCL PF 1 MG/ML IJ SOLN
0.2500 mg | INTRAMUSCULAR | Status: DC | PRN
  Administered 2013-07-04: 0.5 mg via INTRAVENOUS

## 2013-07-04 MED ORDER — GLYCOPYRROLATE 0.2 MG/ML IJ SOLN
INTRAMUSCULAR | Status: DC | PRN
  Administered 2013-07-04: .4 mg via INTRAVENOUS

## 2013-07-04 MED ORDER — LACTATED RINGERS IV SOLN: INTRAVENOUS | Status: DC

## 2013-07-04 MED ORDER — LACTATED RINGERS IV SOLN
INTRAVENOUS | Status: DC | PRN
  Administered 2013-07-04: 1000 mL
  Administered 2013-07-04: 08:00:00 via INTRAVENOUS

## 2013-07-04 MED ORDER — HYDROMORPHONE HCL PF 1 MG/ML IJ SOLN
INTRAMUSCULAR | Status: AC
  Filled 2013-07-04: qty 1

## 2013-07-04 MED ORDER — NEOSTIGMINE METHYLSULFATE 1 MG/ML IJ SOLN
INTRAMUSCULAR | Status: DC | PRN
  Administered 2013-07-04: 3 mg via INTRAVENOUS

## 2013-07-04 MED ORDER — STERILE WATER FOR IRRIGATION IR SOLN
Status: DC | PRN
  Administered 2013-07-04: 1500 mL

## 2013-07-04 MED ORDER — INSULIN ASPART 100 UNIT/ML ~~LOC~~ SOLN
SUBCUTANEOUS | Status: AC
  Filled 2013-07-04: qty 1

## 2013-07-04 MED ORDER — ISOPROPYL ALCOHOL 70 % SOLN
Status: DC | PRN
  Administered 2013-07-04: 1 via TOPICAL

## 2013-07-04 MED ORDER — 0.9 % SODIUM CHLORIDE (POUR BTL) OPTIME
TOPICAL | Status: DC | PRN
  Administered 2013-07-04: 1000 mL

## 2013-07-04 MED ORDER — PROPOFOL 10 MG/ML IV BOLUS
INTRAVENOUS | Status: DC | PRN
  Administered 2013-07-04: 110 mg via INTRAVENOUS

## 2013-07-04 MED ORDER — CEFAZOLIN SODIUM-DEXTROSE 2-3 GM-% IV SOLR
2.0000 g | INTRAVENOUS | Status: AC
  Administered 2013-07-04: 2 g via INTRAVENOUS

## 2013-07-04 MED ORDER — OXYCODONE-ACETAMINOPHEN 5-325 MG PO TABS: ORAL_TABLET | ORAL | Status: DC

## 2013-07-04 MED ORDER — LIDOCAINE-EPINEPHRINE 2 %-1:100000 IJ SOLN
INTRAMUSCULAR | Status: AC
  Filled 2013-07-04: qty 10.2

## 2013-07-04 MED ORDER — BUPIVACAINE-EPINEPHRINE PF 0.5-1:200000 % IJ SOLN
INTRAMUSCULAR | Status: DC | PRN
  Administered 2013-07-04: 3.6 mL

## 2013-07-04 MED ORDER — OXYMETAZOLINE HCL 0.05 % NA SOLN
NASAL | Status: DC | PRN
  Administered 2013-07-04 (×3): 1 via NASAL

## 2013-07-04 MED ORDER — PHENYLEPHRINE HCL 10 MG/ML IJ SOLN
INTRAMUSCULAR | Status: DC | PRN
  Administered 2013-07-04: 40 ug via INTRAVENOUS
  Administered 2013-07-04: 80 ug via INTRAVENOUS

## 2013-07-04 MED ORDER — LIDOCAINE HCL (CARDIAC) 20 MG/ML IV SOLN
INTRAVENOUS | Status: DC | PRN
  Administered 2013-07-04: 100 mg via INTRAVENOUS

## 2013-07-04 MED ORDER — OXYCODONE-ACETAMINOPHEN 5-325 MG PO TABS
1.0000 | ORAL_TABLET | ORAL | Status: DC | PRN
  Administered 2013-07-04: 0.5 via ORAL
  Filled 2013-07-04: qty 1

## 2013-07-04 MED ORDER — CEFAZOLIN SODIUM-DEXTROSE 2-3 GM-% IV SOLR
INTRAVENOUS | Status: AC
  Filled 2013-07-04: qty 50

## 2013-07-04 MED ORDER — SUFENTANIL CITRATE 50 MCG/ML IV SOLN
INTRAVENOUS | Status: DC | PRN
  Administered 2013-07-04 (×2): 10 ug via INTRAVENOUS

## 2013-07-04 MED ORDER — CISATRACURIUM BESYLATE (PF) 10 MG/5ML IV SOLN
INTRAVENOUS | Status: DC | PRN
  Administered 2013-07-04: 6 mg via INTRAVENOUS

## 2013-07-04 MED ORDER — PROMETHAZINE HCL 25 MG/ML IJ SOLN: 6.2500 mg | INTRAMUSCULAR | Status: DC | PRN

## 2013-07-04 MED ORDER — BUPIVACAINE-EPINEPHRINE PF 0.5-1:200000 % IJ SOLN
INTRAMUSCULAR | Status: AC
  Filled 2013-07-04: qty 7.2

## 2013-07-04 MED ORDER — ONDANSETRON HCL 4 MG/2ML IJ SOLN
INTRAMUSCULAR | Status: DC | PRN
  Administered 2013-07-04: 4 mg via INTRAVENOUS

## 2013-07-04 SURGICAL SUPPLY — 26 items
ATTRACTOMAT 16X20 MAGNETIC DRP (DRAPES) ×2 IMPLANT
BAG SPEC THK2 15X12 ZIP CLS (MISCELLANEOUS) ×1
BAG ZIPLOCK 12X15 (MISCELLANEOUS) ×2 IMPLANT
BLADE SURG 15 STRL LF DISP TIS (BLADE) ×2 IMPLANT
BLADE SURG 15 STRL SS (BLADE) ×4
CANNULA VESSEL W/WING WO/VALVE (CANNULA) ×4 IMPLANT
CLOTH BEACON ORANGE TIMEOUT ST (SAFETY) ×2 IMPLANT
GAUZE SPONGE 4X4 16PLY XRAY LF (GAUZE/BANDAGES/DRESSINGS) ×2 IMPLANT
GLOVE SURG ORTHO 8.0 STRL STRW (GLOVE) ×2 IMPLANT
GLOVE SURG SS PI 6.5 STRL IVOR (GLOVE) ×2 IMPLANT
GOWN STRL NON-REIN LRG LVL3 (GOWN DISPOSABLE) ×2 IMPLANT
GOWN STRL REIN 3XL LVL4 (GOWN DISPOSABLE) ×2 IMPLANT
KIT BASIN OR (CUSTOM PROCEDURE TRAY) ×2 IMPLANT
NS IRRIG 1000ML POUR BTL (IV SOLUTION) ×2 IMPLANT
PACK EENT SPLIT (PACKS) ×2 IMPLANT
PACKING VAGINAL (PACKING) ×2 IMPLANT
PAD EYE OVAL STERILE LF (GAUZE/BANDAGES/DRESSINGS) IMPLANT
SPONGE GAUZE 4X4 12PLY (GAUZE/BANDAGES/DRESSINGS) ×2 IMPLANT
SUCTION FRAZIER 12FR DISP (SUCTIONS) ×2 IMPLANT
SUT CHROMIC 3 0 PS 2 (SUTURE) ×8 IMPLANT
SUT CHROMIC 4 0 P 3 18 (SUTURE) IMPLANT
SYR 50ML LL SCALE MARK (SYRINGE) ×2 IMPLANT
TOWEL OR 17X26 10 PK STRL BLUE (TOWEL DISPOSABLE) ×2 IMPLANT
TUBING CONNECTING 10 (TUBING) ×2 IMPLANT
WATER STERILE IRR 1500ML POUR (IV SOLUTION) ×2 IMPLANT
YANKAUER SUCT BULB TIP NO VENT (SUCTIONS) ×2 IMPLANT

## 2013-07-04 NOTE — Anesthesia Procedure Notes (Signed)
Procedure Name: Intubation Date/Time: 07/04/2013 8:52 AM Performed by: Leroy Libman L Patient Re-evaluated:Patient Re-evaluated prior to inductionOxygen Delivery Method: Circle system utilized Preoxygenation: Pre-oxygenation with 100% oxygen Intubation Type: IV induction Ventilation: Mask ventilation without difficulty and Nasal airway inserted- appropriate to patient size Laryngoscope Size: Hyacinth Meeker and 2 Grade View: Grade I Nasal Tubes: Nasal Rae, Right, Nasal prep performed and Magill forceps - small, utilized Tube size: 6.5 mm Number of attempts: 1 Placement Confirmation: ETT inserted through vocal cords under direct vision,  breath sounds checked- equal and bilateral and positive ETCO2 Secured at: 26 cm Tube secured with: Tape Dental Injury: Teeth and Oropharynx as per pre-operative assessment

## 2013-07-04 NOTE — Op Note (Signed)
Patient:            Emily Lawson Date of Birth:  [birth year] MRN:                [id number]   DATE OF PROCEDURE:  07/04/2013               OPERATIVE REPORT   PREOPERATIVE DIAGNOSES: 1. Metastatic colon cancer 2. Chemotherapy 3. Chronic periodontitis 4. Chronic apical periodontitis 5. Multiple mobile teeth  POSTOPERATIVE DIAGNOSES: 1. Metastatic colon cancer 2. Chemotherapy 3. Chronic periodontitis 4. Chronic apical periodontitis 5. Multiple mobile teeth  OPERATIONS: 1. Multiple extraction of tooth numbers 7, 8, 9, 10, 11, 12, 13, 20, 21, 22, 23, 25, 26, 27, 28, 29, and 31 . 2. 4 Quadrants of alveoloplasty  SURGEON: Charlynne Pander, DDS  ASSISTANT: Rory Percy (dental assistant)  ANESTHESIA: General anesthesia via nasoendotracheal tube.  MEDICATIONS: 1. Ancef 2 g IV prior to invasive dental procedures. 2. Local anesthesia with a total utilization of 4 carpules each containing 34 mg of lidocaine with 0.017 mg of epinephrine as well as 2 carpules each containing 9 mg of bupivacaine with 0.009 mg of epinephrine.  SPECIMENS: There are 17 teeth that were discarded.  DRAINS: None  CULTURES: None  COMPLICATIONS: None   ESTIMATED BLOOD LOSS: 100 mLs.  INTRAVENOUS FLUIDS: 1000 mLs of Lactated ringers solution.  INDICATIONS: The patient was previously diagnosed with metastatic colon cancer. Patient was on active chemotherapy with Xeloda and Avastin.  A dental consultation was then requested to rule out dental infection that may affect the patient's systemic health while undergoing active chemotherapy.  The patient was examined and treatment planned for extraction of remaining teeth with alveoloplasty as indicated.  This treatment plan was formulated to decrease the risks and complications associated with dental infection from affecting the patient's systemic health.  OPERATIVE FINDINGS: Patient was examined operating room number 12.  The teeth were identified for extraction.  The patient was noted be affected by chronic periodontitis, chronic apical periodontitis, multiple mobile teeth, and malocclusion.   DESCRIPTION OF PROCEDURE: Patient was brought to the main operating room number 12. Patient was then placed in the supine position on the operating table. General Anesthesia was then induced per the anesthesia team. The patient was then prepped and draped in the usual manner for dental medicine procedure. A timeout was performed. The patient was identified and procedures were verified. A throat pack was placed at this time. The oral cavity was then thoroughly examined with the findings noted above. The patient was then ready for dental medicine procedure as follows:  Local anesthesia was then administered sequentially with a total utilization of 4 carpules each containing 34 mg of lidocaine with 0.017 mg of epinephrine as well as 2 carpules  each containing 9 mg bupivacaine with 0.009 mg of epinephrine.  The Maxillary left and right quadrants first approached. Anesthesia was then delivered utilizing infiltration with lidocaine with epinephrine. A #15 blade incision was then made from the distal of #5 and extended to the distal of #14.  A  surgical flap was then carefully reflected. Appropriate amounts of buccal and interseptal bone were then removed.  The teeth were then subluxated with a series of straight elevators. The crown portions of the teeth were then removed with a 150 forceps leaving the roots remaining. The roots were then removed with a 150 forceps leaving only retained root in the area of #11. Further bone was then removed appropriately on the buccal  and interseptal aspect of #11 and the root was elevated out with a series of elevators. Alveoloplasty was then performed utilizing a Rongeurs and bone file. Excess granulation tissue was then removed. The tissues were approximated and trimmed appropriately. The surgical site was then irrigated with copious amounts of  sterile saline x2. The maxillary left surgical site was then closed from the distal of #14 extended to the mesial #9 utilizing 3-0 chromic gut suture in a continuous interrupted suture technique x1. The maxillary right surgical site was then closed from the distal of #5 and extended to the mesial of #8 utilizing 3-0 chromic gut suture in a continuous interrupted suture technique x1.   At this point time, the mandibular quadrants were approached. The patient was given bilateral inferior alveolar nerve blocks and long buccal nerve blocks utilizing the bupivacaine with epinephrine. Further infiltration was then achieved utilizing the lidocaine with epinephrine. A 15 blade incision was then made from the distal of number of #19 and extended to the distal of #32. A surgical flap was then carefully reflected. Appropriate amounts of buccal and interseptal bone were then removed appropriately. Tooth numbers 20, 21, 22, 23, 25, 26, 27, 28, and 29 were then removed with a 151 forceps without complications. Tooth #31 was then approached with a 23 forceps. The coronal aspect and distal root were then removed appropriately. This left the mesial root remaining. Further bone was then removed with a surgical handpiece and bur and copious of sterile water. This was then elevated out with a cryers elevator. At this point time alveoloplasty was then performed of the mandibular right and mandibular left quadrant appropriately. The tissues were approximated and trimmed appropriately. Significant granulation tissue was removed at this time. The surgical sites were then irrigated with copious amounts sterile saline. The mandibular left surgical site was then closed from the distal of #19 and extended the mesial numbers 24 utilizing 3-0 chromic gut suture in a continuous interrupted suture technique x1. The mandibular right surgical site was then closed from the distal of #32 and extended the mesial numbers 25 utilizing 3-0 chromic gut  suture in a continuous interrupted suture technique x1.   At this point time, the entire mouth was irrigated with copious amounts of sterile saline. The patient was examined for complications, seeing none, the dental medicine procedure was deemed to be complete. The throat pack was removed at this time. A series of 4 x 4 gauze were placed in the mouth to aid hemostasis. The patient was then handed over to the anesthesia team for final disposition. After an appropriate amount of time, the patient was extubated and taken to the postanesthsia care unit with stable vital signs and a good condition. All counts were correct for the dental medicine procedure.   Charlynne Pander, DDS.

## 2013-07-04 NOTE — H&P (Signed)
07/04/2013  Patient:            Emily Lawson Date of Birth:  Apr 07, 1936 MRN:                [id number]  The patient denies any acute medical or dental changes. The patient has been off of Avastin therapy per instructions with Dr. Truett Perna to minimize risks for bleeding and delayed healing. Patient will remain off of Avastin therapy for an additional period of time to allow for adequate healing. Please see the note of 06/20/2013 below to use as a history and physical for dental operating room procedure.  Dr. Kristin Bruins     OFFICE PROGRESS NOTE  Interval history:  Emily Lawson returns as scheduled. She completed cycle 1 Xeloda/Avastin beginning 05/30/2013. She had 2 episodes of nausea with vomiting. Her tongue became sore. She developed a single lip ulceration. She has periodic loose stools. She noted blood with a bowel movement recently. She has had several bowel movements since then with no bleeding. She denies any other bleeding. No hand or foot pain or redness. She denies abdominal pain. She has chronic back pain. She reports intermittent dizziness. She associates the dizziness with Xeloda. Her daughter notes she is sleeping more.  Objective:  Blood pressure 139/70, pulse 74, temperature 97 F (36.1 C), temperature source Oral, resp. rate 17, height 5' (1.524 m), weight 209 lb (94.802 kg), SpO2 100.00%.  Small ulceration at the left side of the lower lip. No ulcerations within the oral cavity. Lungs are clear. Regular cardiac rhythm. Port-A-Cath site is without erythema. Abdomen is soft. No hepatomegaly. Extremities without edema. Palms with mild erythema at the base of the thumbs bilaterally.  Lab Results:  Lab Results   Component  Value  Date    WBC  5.7  06/20/2013    HGB  9.3*  06/20/2013    HCT  28.3*  06/20/2013    MCV  99.0  06/20/2013    PLT  182  06/20/2013   Chemistry:  Chemistry       Component  Value  Date/Time  Component  Value  Date/Time    NA  139  05/16/2013 1030   CALCIUM  9.5   05/16/2013 1030    NA  135  07/17/2012 1058   CALCIUM  8.7  07/17/2012 1058    K  4.1  05/16/2013 1030   ALKPHOS  244 Repeated and Verified*  05/16/2013 1030    K  4.7  07/17/2012 1058   ALKPHOS  85  07/17/2012 1058    CL  106  05/16/2013 1030   AST  33  05/16/2013 1030    CL  103  07/17/2012 1058   AST  17  07/17/2012 1058    CO2  21*  05/16/2013 1030   ALT  29  05/16/2013 1030    CO2  18*  07/17/2012 1058   ALT  13  07/17/2012 1058    BUN  23.5  05/16/2013 1030   BILITOT  1.02  05/16/2013 1030    BUN  21  07/17/2012 1058   BILITOT  0.4  07/17/2012 1058    CREATININE  1.1  05/16/2013 1030     CREATININE  1.26*  07/17/2012 1058     CREATININE  1.29*  05/20/2009 1423     GLU  102*  10/19/2007 1433    Studies/Results:  No results found.  Medications: I have reviewed the patient's current medications.  Assessment/Plan:  1. Metastatic  colon cancer presenting with synchronous colon primaries in September, 2008, status post a hemicolectomy and liver biopsy 08/03/2007. A CT on 04/20/2010 revealed no evidence metastatic disease. She was maintained on 5-FU/leucovorin per the FOLFOX regimen beginning in April, 2009. Avasta was discontinued from the chemotherapy regimen beginning in November, 2009 due to nephrotic range proteinuria. A CT of the abdomen and pelvis 02/05/2011 revealed evidence of three small liver lesions. An MRI of the abdomen on 03/02/2011 confirmed 3 liver lesions and no additional evidence of metastatic disease. K-RAS testing on the transverse colon and left colon tumors revealed a wild type genotype.  1A. Staging PET scan 03/21/2011 confirmed 3 hypermetabolic liver lesions and no additional evidence of metastatic disease.  1B. Surgical consultation by Dr. Donell Beers was obtained and Emily Lawson decided against an attempt at surgical resection/radiofrequency ablation.  1C. Salvage therapy with FOLFOX was initiated 04/13/2011.  1D. A restaging CT 06/28/2011 revealed a significant improvement in the hepatic  metastasis.  1E. She completed eight cycles of FOLFOX chemotherapy. Treatment was switched to 5FU-leucovorin as per the FOLFOX regimen beginning with cycle #9 due to an allergic reaction (skin rash) following cycle #8.  54F. Restaging CT 10/25/2011 revealed 2 more conspicuous liver metastases .  1G. Restaging PET scan 12/24/2011 revealed an increase in the size and metabolic activity of 3 liver metastases  1H. She began FOLFIRI chemotherapy on 01/19/2012.  1 I. Restaging CT of the abdomen 05/08/2012 revealed a mixed response with enlargement of 2 liver lesions and a decrease in the size of a third lesion, no new lesions.  1J. FOLFIRI chemotherapy continued with the addition of Avastin beginning 05/09/2012.  1K. restaging CT 07/28/2012 with no new liver lesions and a decreased size of the previously noted 3 liver lesions.  1L. She was last treated with FOLFIRI/Avastin on 09/12/2012.  32M. She completed stereotactic radiotherapy to 3 liver metastases 10/10/2012 through 10/20/2012.  1N. Follow-up MRI of the abdomen 11/24/2012 showed improvement in the 3 hepatic metastases and no progressive disease.  1O. Restaging MRI 04/09/2013 showed improvement in the 3 treated hepatic lesions and 2 new lesions. No evidence of metastatic disease outside of liver.  1P. Initiation of Xeloda/Avastin 05/30/2013.  1. Diabetes. 2. History of proteinuria secondary to Avastin versus diabetes. 3. Mucositis and hand foot syndrome, secondary to 5FU improved with a dose reduction of the 5-FU. 4. Anemia secondary to chemotherapy.  5. Left back and left leg pain with an MRI of the lumbar spine confirming lumbar disk disease. She is status post surgery by Dr. Franky Macho 02/14/2008 with resolution of the back and left leg pain. 6. Rosacea. She is followed by dermatology. A recent flare, now maintained on doxycycline 7. Depression, maintained on Cymbalta-she requested a referral to Dr. Noe Gens 8. History of bilateral knee pain  secondary to arthritis -- she takes Vicodin as needed. 9. History of anorexia -- weight loss -- ?Related to depression/anxiety or metastatic colon cancer. 10. Allergic reaction to oxaliplatin with FOLFOX on 04/13/2011. She was able to tolerate FOLFOX on 04/20/2011 with steroid premedication and a prolonged oxaliplatin infusion. After cycle #8 she developed a rash upon returning home with pruritus. She took Benadryl with resolution of symptoms. This reaction occurred despite steroid premedication. 11. Poor dentition -- she has been evaluated by Dr. Kristin Bruins. She saw Dr. Kristin Bruins recently and is scheduled for tooth extractions 07/04/2013.  12. Neutropenia following cycle 1 of FOLFIRI. The FOLFIRI was dose reduced beginning with cycle 2. She was neutropenic 03/14/2012 and chemotherapy was  held for one week.  13. Nausea and diarrhea during the irinotecan infusion on 03/21/2012. Symptoms resolved with atropine. She receives atropine as a premedication with each cycle. 14. Diarrhea following the 05/09/2012 chemotherapy cycle. Likely related to irinotecan. She takes Imodium as needed. 15. Diffuse pain 4-5 days after chemotherapy 07/17/2012-likely toxicity from Neulasta. 16. History of a fall with fracture of the right fifth metacarpal. 17. Intermittent "dizziness". She did not experience dizziness the week she was off of Xeloda. She will contact the office if the dizziness occurs with cycle 2. Disposition-Emily Lawson appears stable. She has completed one cycle of Xeloda/Avastin. We are placing further Avastin on hold pending the upcoming dental surgery. She will proceed with cycle 2 Xeloda today as scheduled. We will see her in followup in 3 weeks. She will contact the office in the interim as outlined above or with any other problems.  Plan reviewed with Dr. Truett Perna.  Lonna Cobb ANP/GNP-BC

## 2013-07-04 NOTE — Transfer of Care (Signed)
Immediate Anesthesia Transfer of Care Note  Patient: [name]  Procedure(s) Performed: Procedure(s): extraction of tooth #'s 7,8,9,10,11,12,13,20,21,22,23,25,26,27,28,29,31  with alveoloplasty (N/A)  Patient Location: PACU  Anesthesia Type:General  Level of Consciousness: awake, alert  and oriented  Airway & Oxygen Therapy: Patient Spontanous Breathing and Patient connected to face mask oxygen  Post-op Assessment: Report given to PACU RN and Post -op Vital signs reviewed and stable  Post vital signs: Reviewed and stable  Complications: No apparent anesthesia complications

## 2013-07-04 NOTE — Progress Notes (Signed)
PRE-OPERATIVE NOTE:  07/04/2013 FRANKYE SCHWEGEL [id number]  VITALS: BP 104/64  Pulse 78  Temp(Src) 97.7 F (36.5 C) (Oral)  Resp 18  SpO2 100%  Lab Results  Component Value Date   WBC 6.8 07/03/2013   HGB 9.6* 07/03/2013   HCT 28.4* 07/03/2013   MCV 100.7* 07/03/2013   PLT 234 07/03/2013   BMET    Component Value Date/Time   NA 136 07/03/2013 0935   NA 139 05/16/2013 1030   K 3.8 07/03/2013 0935   K 4.1 05/16/2013 1030   CL 99 07/03/2013 0935   CL 106 05/16/2013 1030   CO2 24 07/03/2013 0935   CO2 21* 05/16/2013 1030   GLUCOSE 192* 07/03/2013 0935   GLUCOSE 173* 05/16/2013 1030   BUN 26* 07/03/2013 0935   BUN 23.5 05/16/2013 1030   CREATININE 1.19* 07/03/2013 0935   CREATININE 1.1 05/16/2013 1030   CREATININE 1.29* 05/20/2009 1423   CALCIUM 10.0 07/03/2013 0935   CALCIUM 9.5 05/16/2013 1030   GFRNONAA 44* 07/03/2013 0935   GFRAA 50* 07/03/2013 0935    Lab Results  Component Value Date   INR 1.0 08/03/2007   No results found for this basename: PTT     Patient presents for extraction of remaining teeth with alveoloplasty and pre-prosthetic surgery as indicated in the operating room.  SUBJECTIVE: The patient denies any acute medical or dental changes and agrees to proceed with treatment as planned.  EXAM: No sign of acute dental changes.  ASSESSMENT: Patient is affected by chronic apical periodontitis, chronic periodontitis, tooth mobility, and malocclusion.  PLAN: Patient agrees to proceed with treatment as planned in the operating room as previously discussed and accepts the risks, benefits, complications of the proposed treatment.  Charlynne Pander, DDS

## 2013-07-04 NOTE — Anesthesia Postprocedure Evaluation (Signed)
Anesthesia Post Note  Patient: Emily Lawson  Procedure(s) Performed: Procedure(s) (LRB): extraction of tooth #'s 7,8,9,10,11,12,13,20,21,22,23,25,26,27,28,29,31  with alveoloplasty (N/A)  Anesthesia type: General  Patient location: PACU  Post pain: Pain level controlled  Post assessment: Post-op Vital signs reviewed  Last Vitals:  Filed Vitals:   07/04/13 1145  BP: 143/61  Pulse: 73  Temp: 36.7 C  Resp: 16    Post vital signs: Reviewed  Level of consciousness: sedated  Complications: No apparent anesthesia complications

## 2013-07-04 NOTE — Preoperative (Signed)
Beta Blockers   Reason not to administer Beta Blockers:Not Applicable 

## 2013-07-04 NOTE — Progress Notes (Signed)
07/04/2013  Patient:            Emily Lawson Date of Birth:  28-Apr-1936 MRN:                [id number]  I was informed by short stay nurse (Tammy) that the  patient is unable to swallow the Percocet tablets. Decision was made to provide a prescription for hydrocodone/acetaminophen elixir. Hycet 7.5mg -325 mg/15 mLs Dispense 120 ML's with one refill. Directions are to take 15 ML's every 6 hours as needed for pain. Dr. Kristin Bruins

## 2013-07-04 NOTE — Progress Notes (Signed)
Call To Dr Kristin Bruins to get liquids pain meds for home

## 2013-07-05 ENCOUNTER — Telehealth: Payer: Self-pay | Admitting: *Deleted

## 2013-07-05 ENCOUNTER — Encounter (HOSPITAL_COMMUNITY): Payer: Self-pay | Admitting: Dentistry

## 2013-07-05 NOTE — Telephone Encounter (Signed)
Called to confirm Xeloda is on hold as they were informed by patient. Confirmed this with staff member. Will see her on 07/11/13 to evaluate. Will have patient call if refill needed.

## 2013-07-11 ENCOUNTER — Ambulatory Visit: Payer: Self-pay | Admitting: Nurse Practitioner

## 2013-07-11 ENCOUNTER — Other Ambulatory Visit: Payer: Self-pay | Admitting: Lab

## 2013-07-16 ENCOUNTER — Telehealth: Payer: Self-pay | Admitting: *Deleted

## 2013-07-16 ENCOUNTER — Encounter (HOSPITAL_COMMUNITY): Payer: Self-pay | Admitting: Dentistry

## 2013-07-16 ENCOUNTER — Ambulatory Visit (HOSPITAL_COMMUNITY): Payer: Self-pay | Admitting: Dentistry

## 2013-07-16 VITALS — BP 110/57 | HR 61 | Temp 97.9°F

## 2013-07-16 DIAGNOSIS — K08409 Partial loss of teeth, unspecified cause, unspecified class: Secondary | ICD-10-CM

## 2013-07-16 DIAGNOSIS — C189 Malignant neoplasm of colon, unspecified: Secondary | ICD-10-CM

## 2013-07-16 DIAGNOSIS — K0820 Unspecified atrophy of edentulous alveolar ridge: Secondary | ICD-10-CM

## 2013-07-16 DIAGNOSIS — K08109 Complete loss of teeth, unspecified cause, unspecified class: Secondary | ICD-10-CM

## 2013-07-16 DIAGNOSIS — C787 Secondary malignant neoplasm of liver and intrahepatic bile duct: Secondary | ICD-10-CM

## 2013-07-16 NOTE — Patient Instructions (Addendum)
PLAN: 1.  Continue salt water rinses every 2 hours while awake. 2.  Followup with a dentist of her choice for fabrication of upper and lower complete dentures after adequate healing. Ideally, the patient would require a prosthodontist evaluation for denture fabrication possible implant therapy to assist in retention and stability of the dentures. 3.  Call if problems with healing or exposed bone. 4. Return to clinic in 1 month for evaluation of the 5. Follow up with Dr. Truett Perna for restart of chemotherapy as per his discretion.    MOUTH CARE AFTER SURGERY  FACTS:  Ice used in ice bag helps keep the swelling down, and can help lessen the pain.  It is easier to treat pain BEFORE it happens.  Spitting disturbs the clot and may cause bleeding to start again, or to get worse.  Smoking delays healing and can cause complications.  Sharing prescriptions can be dangerous.  Do not take medications not recently prescribed for you.  Antibiotics may stop birth control pills from working.  Use other means of birth control while on antibiotics.  Warm salt water rinses after the first 24 hours will help lessen the swelling:  Use 1/2 teaspoonful of table salt per oz.of water.  DO NOT:  Do not spit.  Do not drink through a straw.  Strongly advised not to smoke, dip snuff or chew tobacco at least for 3 days.  Do not eat sharp or crunchy foods.  Avoid the area of surgery when chewing.  Do not stop your antibiotics before your instructions say to do so.  Do not eat hot foods until bleeding has stopped.  If you need to, let your food cool down to room temperature.  EXPECT:  Some swelling, especially first 2-3 days.  Soreness or discomfort in varying degrees.  Follow your dentist's instructions about how to handle pain before it starts.  Pinkish saliva or light blood in saliva, or on your pillow in the morning.  This can last around 24 hours.  Bruising inside or outside the mouth.  This  may not show up until 2-3 days after surgery.  Don't worry, it will go away in time.  Pieces of "bone" may work themselves loose.  It's OK.  If they bother you, let us know.  WHAT TO DO IMMEDIATELY AFTER SURGERY:  Bite on the gauze with steady pressure for 1-2 hours.  Don't chew on the gauze.  Do not lie down flat.  Raise your head support especially for the first 24 hours.  Apply ice to your face on the side of the surgery.  You may apply it 20 minutes on and a few minutes off.  Ice for 8-12 hours.  You may use ice up to 24 hours.  Before the numbness wears off, take a pain pill as instructed.  Prescription pain medication is not always required.  SWELLING:  Expect swelling for the first couple of days.  It should get better after that.  If swelling increases 3 days or so after surgery; let us know as soon as possible.  FEVER:  Take Tylenol every 4 hours if needed to lower your temperature, especially if it is at 100F or higher.  Drink lots of fluids.  If the fever does not go away, let us know.  BREATHING TROUBLE:  Any unusual difficulty breathing means you have to have someone bring you to the emergency room ASAP  BLEEDING:  Light oozing is expected for 24 hours or so.  Prop head up  with pillows  Avoid spitting  Do not confuse bright red fresh flowing blood with lots of saliva colored with a little bit of blood.  If you notice some bleeding, place gauze or a tea bag where it is bleeding and apply CONSTANT pressure by biting down for 1 hour.  Avoid talking during this time.  Do not remove the gauze or tea bag during this hour to "check" the bleeding.  If you notice bright RED bleeding FLOWING out of particular area, and filling the floor of your mouth, put a wad of gauze on that area, bite down firmly and constantly.  Call us immediately.  If we're closed, have someone bring you to the emergency room.  ORAL HYGIENE:  Brush your teeth as usual after meals and before  bedtime.  Use a soft toothbrush around the area of surgery.  DO NOT AVOID BRUSHING.  Otherwise bacteria(germs) will grow and may delay healing or encourage infection.  Since you cannot spit, just gently rinse and let the water flow out of your mouth.  DO NOT SWISH HARD.  EATING:  Cool liquids are a good point to start.  Increase to soft foods as tolerated.  PRESCRIPTIONS:  Follow the directions for your prescriptions exactly as written.  If Dr. Kristin Bruins gave you a narcotic pain medication, do not drive, operate machinery or drink alcohol when on that medication.  QUESTIONS:  Call our office during office hours [phone] or call the Emergency Room at [phone].

## 2013-07-16 NOTE — Progress Notes (Signed)
POST OPERATIVE NOTE:  07/16/2013 Emily Lawson [id number]  VITALS: BP 110/57  Pulse 61  Temp(Src) 97.9 F (36.6 C) (Oral)  Emily Lawson is status post multiple extractions with alveoloplasty on 07/04/2013.   SUBJECTIVE: Patient with minimal pain or discomfort. Patient has several stitches that remain.  EXAM: There is no sign of infection, heme, or ooze. Sutures are loosely intact. Generalized primary closure is noted. Atrophy of the edentulous alveolar ridges is noted. Acceptable healing is noted.  Procedure: Sutures removed without complication after a chlorhexidine gluconate rinse for 30 seconds.  ASSESSMENT: Post operative course is consistent with dental procedures performed in the operating room on 07/04/2013. Patient is now edentulous. There is atrophy of the edentulous alveolar ridges.  PLAN: 1.  Continue salt water rinses every 2 hours while awake. 2.  Followup with a dentist of her choice for fabrication of upper and lower complete dentures after adequate healing. Ideally, the patient would require a prosthodontist evaluation for denture fabrication possible implant therapy to assist in retention and stability of the dentures. 3.  Call if problems with healing or exposed bone. 4. Return to clinic in 1 month for evaluation of the 5. Follow up with Dr. Truett Perna for restart of chemotherapy as per his discretion.   Charlynne Pander, DDS

## 2013-07-16 NOTE — Telephone Encounter (Addendum)
Message from pt requesting to reschedule missed appt. S/P dental surgery 07/04/13. Reviewed with Dr. Truett Perna. Appt given for 8/21 at 1045. Lab at 1015. Pt voiced understanding. Order to schedulers.

## 2013-07-17 ENCOUNTER — Telehealth: Payer: Self-pay | Admitting: Oncology

## 2013-07-17 NOTE — Telephone Encounter (Signed)
Called pt and left message regarding lab and MD visit on 8/21

## 2013-07-19 ENCOUNTER — Other Ambulatory Visit: Payer: Self-pay | Admitting: *Deleted

## 2013-07-19 ENCOUNTER — Ambulatory Visit (HOSPITAL_BASED_OUTPATIENT_CLINIC_OR_DEPARTMENT_OTHER): Payer: Medicare Other | Admitting: Oncology

## 2013-07-19 ENCOUNTER — Other Ambulatory Visit (HOSPITAL_BASED_OUTPATIENT_CLINIC_OR_DEPARTMENT_OTHER): Payer: Medicare Other | Admitting: Lab

## 2013-07-19 ENCOUNTER — Telehealth: Payer: Self-pay | Admitting: *Deleted

## 2013-07-19 ENCOUNTER — Telehealth: Payer: Self-pay

## 2013-07-19 VITALS — BP 142/72 | HR 75 | Temp 96.7°F | Resp 20 | Ht 61.0 in | Wt 205.5 lb

## 2013-07-19 DIAGNOSIS — C189 Malignant neoplasm of colon, unspecified: Secondary | ICD-10-CM

## 2013-07-19 LAB — CBC WITH DIFFERENTIAL/PLATELET
BASO%: 1.3 % (ref 0.0–2.0)
LYMPH%: 23 % (ref 14.0–49.7)
MCHC: 34.5 g/dL (ref 31.5–36.0)
MCV: 104.8 fL — ABNORMAL HIGH (ref 79.5–101.0)
MONO%: 6.5 % (ref 0.0–14.0)
Platelets: 226 10*3/uL (ref 145–400)
RBC: 2.7 10*6/uL — ABNORMAL LOW (ref 3.70–5.45)

## 2013-07-19 LAB — COMPREHENSIVE METABOLIC PANEL (CC13)
ALT: 19 U/L (ref 0–55)
Alkaline Phosphatase: 181 U/L — ABNORMAL HIGH (ref 40–150)
Creatinine: 1.1 mg/dL (ref 0.6–1.1)
Glucose: 205 mg/dl — ABNORMAL HIGH (ref 70–140)
Sodium: 142 mEq/L (ref 136–145)
Total Bilirubin: 1.39 mg/dL — ABNORMAL HIGH (ref 0.20–1.20)
Total Protein: 6.6 g/dL (ref 6.4–8.3)

## 2013-07-19 MED ORDER — CAPECITABINE 500 MG PO TABS: 1000.0000 mg | ORAL_TABLET | Freq: Two times a day (BID) | ORAL | Status: DC

## 2013-07-19 MED ORDER — CAPECITABINE 500 MG PO TABS: 2000.0000 mg | ORAL_TABLET | Freq: Two times a day (BID) | ORAL | Status: DC

## 2013-07-19 NOTE — Telephone Encounter (Signed)
gv and printed appt sched and avs for pt...emailed MW to add tx... °

## 2013-07-19 NOTE — Telephone Encounter (Signed)
Per staff phone call and POF I have schedueld appts.  JMW  

## 2013-07-19 NOTE — Progress Notes (Signed)
[facility]    OFFICE PROGRESS NOTE   INTERVAL HISTORY:   She returns as scheduled. She discontinue Xeloda approximately 10 days into this cycle to secondary to pain over the hands and feet. This resolved after a few days.  She underwent multiple tooth extractions and alveoloplasty procedures on 07/04/2013. She reports tolerating the procedure well.  She feels well in general. Avastin was last given on 05/30/2013.  Objective:  Vital signs in last 24 hours:  Blood pressure 142/72, pulse 75, temperature 96.7 F (35.9 C), temperature source Oral, resp. rate 20, height 5\' 1"  (1.549 m), weight 205 lb 8 oz (93.214 kg).    HEENT: Edentulous, no open wounds, no thrush Resp: Lungs clear bilaterally Cardio: Regular rate and rhythm GI: No hepatomegaly nontender Vascular: No leg edema  Skin: Hyperpigmentation with dry desquamation at the hands and feet.   Portacath/PICC-without erythema  Lab Results:  Lab Results  Component Value Date   WBC 7.7 07/19/2013   HGB 9.8* 07/19/2013   HCT 28.3* 07/19/2013   MCV 104.8* 07/19/2013   PLT 226 07/19/2013   ANC 4 point    Medications: I have reviewed the patient's current medications.  Assessment/Plan: 1. Metastatic colon cancer presenting with synchronous colon primaries in September, 2008, status post a hemicolectomy and liver biopsy 08/03/2007. A CT on 04/20/2010 revealed no evidence metastatic disease. She was maintained on 5-FU/leucovorin per the FOLFOX regimen beginning in April, 2009. Avasta was discontinued from the chemotherapy regimen beginning in November, 2009 due to nephrotic range proteinuria. A CT of the abdomen and pelvis 02/05/2011 revealed evidence of three small liver lesions. An MRI of the abdomen on 03/02/2011 confirmed 3 liver lesions and no additional evidence of metastatic disease. K-RAS testing on the transverse colon and left colon tumors revealed a wild type genotype.  1A. Staging PET scan 03/21/2011  confirmed 3 hypermetabolic liver lesions and no additional evidence of metastatic disease.  1B. Surgical consultation by Dr. Donell Beers was obtained and Ms. Horstman decided against an attempt at surgical resection/radiofrequency ablation.  1C. Salvage therapy with FOLFOX was initiated 04/13/2011.  1D. A restaging CT 06/28/2011 revealed a significant improvement in the hepatic metastasis.  1E. She completed eight cycles of FOLFOX chemotherapy. Treatment was switched to 5FU-leucovorin as per the FOLFOX regimen beginning with cycle #9 due to an allergic reaction (skin rash) following cycle #8.  62F. Restaging CT 10/25/2011 revealed 2 more conspicuous liver metastases .  1G. Restaging PET scan 12/24/2011 revealed an increase in the size and metabolic activity of 3 liver metastases  1H. She began FOLFIRI chemotherapy on 01/19/2012.  1 I. Restaging CT of the abdomen 05/08/2012 revealed a mixed response with enlargement of 2 liver lesions and a decrease in the size of a third lesion, no new lesions.  1J. FOLFIRI chemotherapy continued with the addition of Avastin beginning 05/09/2012.  1K. restaging CT 07/28/2012 with no new liver lesions and a decreased size of the previously noted 3 liver lesions.  1L. She was last treated with FOLFIRI/Avastin on 09/12/2012.  39M. She completed stereotactic radiotherapy to 3 liver metastases 10/10/2012 through 10/20/2012.  1N. Follow-up MRI of the abdomen 11/24/2012 showed improvement in the 3 hepatic metastases and no progressive disease.  1O. Restaging MRI 04/09/2013 showed improvement in the 3 treated hepatic lesions and 2 new lesions. No evidence of metastatic disease outside of liver.  1P. Initiation of Xeloda/Avastin 05/30/2013. Cycle 2 initiated on 06/20/2013 (Avastin placed on hold)-Xeloda discontinued after 10 days secondary to  hand/foot syndrome 1. Diabetes. 2. History of proteinuria secondary to Avastin versus diabetes. 3. Mucositis and hand foot syndrome, secondary  to 5FU improved with a dose reduction of the 5-FU. 4. Anemia secondary to chemotherapy.  5. Left back and left leg pain with an MRI of the lumbar spine confirming lumbar disk disease. She is status post surgery by Dr. Franky Macho 02/14/2008 with resolution of the back and left leg pain. 6. Rosacea. She is followed by dermatology. A recent flare, now maintained on doxycycline 7. Depression, maintained on Cymbalta-she requested a referral to Dr. Noe Gens 8. History of bilateral knee pain secondary to arthritis -- she takes Vicodin as needed. 9. History of anorexia -- weight loss -- ?Related to depression/anxiety or metastatic colon cancer. 10. Allergic reaction to oxaliplatin with FOLFOX on 04/13/2011. She was able to tolerate FOLFOX on 04/20/2011 with steroid premedication and a prolonged oxaliplatin infusion. After cycle #8 she developed a rash upon returning home with pruritus. She took Benadryl with resolution of symptoms. This reaction occurred despite steroid premedication. 11. Poor dentition -- she has been evaluated by Dr. Kristin Bruins. She saw Dr. Kristin Bruins recently and is scheduled for tooth extractions 07/04/2013.  12. Neutropenia following cycle 1 of FOLFIRI. The FOLFIRI was dose reduced beginning with cycle 2. She was neutropenic 03/14/2012 and chemotherapy was held for one week.  13. Nausea and diarrhea during the irinotecan infusion on 03/21/2012. Symptoms resolved with atropine. She receives atropine as a premedication with each cycle. 14. Diarrhea following the 05/09/2012 chemotherapy cycle. Likely related to irinotecan. She takes Imodium as needed. 15. Diffuse pain 4-5 days after chemotherapy 07/17/2012-likely toxicity from Neulasta. 16. History of a fall with fracture of the right fifth metacarpal. 17. Hand/foot syndrome secondary to Glenetta Borg will be dose reduced beginning with cycle 3 on 07/23/2013 18.  Multiple tooth extractions on 07/04/2013   Disposition:  Ms. Karwowski appears  well today. She has recovered from the oral surgery. She developed significant hand/foot syndrome with the second cycle of Xeloda. The Xeloda will be dose reduced with cycle 3 scheduled to begin on 07/23/2013. She will discontinue Xeloda and contact us if she develops hand/foot symptoms with this cycle.  She will return for an office visit prior to the next cycle of Xeloda on 08/13/2013. Avastin will be resumed on 08/13/2013. Thornton Papas, MD  07/19/2013  6:01 PM

## 2013-07-24 NOTE — Telephone Encounter (Signed)
RECEIVED A FAX FROM [employer] CONCERNING A CONFIRMATION OF PRESCRIPTION SHIPMENT FOR CAPECITABINE ON 07/23/13. 

## 2013-08-10 ENCOUNTER — Encounter: Payer: Self-pay | Admitting: *Deleted

## 2013-08-10 NOTE — Progress Notes (Signed)
RECEIVED A FAX FROM [facility] CONCERNING A CONFIRMATION OF PRESCRIPTION SHIPMENT FOR CAPECITABINE ON 08/09/13.

## 2013-08-11 ENCOUNTER — Other Ambulatory Visit: Payer: Self-pay | Admitting: Oncology

## 2013-08-13 ENCOUNTER — Ambulatory Visit: Payer: Self-pay

## 2013-08-13 ENCOUNTER — Ambulatory Visit (HOSPITAL_BASED_OUTPATIENT_CLINIC_OR_DEPARTMENT_OTHER): Payer: Medicare Other | Admitting: Nurse Practitioner

## 2013-08-13 ENCOUNTER — Other Ambulatory Visit (HOSPITAL_BASED_OUTPATIENT_CLINIC_OR_DEPARTMENT_OTHER): Payer: Medicare Other | Admitting: Lab

## 2013-08-13 ENCOUNTER — Telehealth: Payer: Self-pay | Admitting: Oncology

## 2013-08-13 VITALS — BP 110/64 | HR 77 | Temp 97.2°F | Resp 20 | Ht 61.0 in | Wt 199.3 lb

## 2013-08-13 DIAGNOSIS — C189 Malignant neoplasm of colon, unspecified: Secondary | ICD-10-CM

## 2013-08-13 LAB — CBC WITH DIFFERENTIAL/PLATELET
BASO%: 0.6 % (ref 0.0–2.0)
EOS%: 5.8 % (ref 0.0–7.0)
Eosinophils Absolute: 0.4 10*3/uL (ref 0.0–0.5)
LYMPH%: 29.4 % (ref 14.0–49.7)
MCH: 35.1 pg — ABNORMAL HIGH (ref 25.1–34.0)
MCHC: 33.5 g/dL (ref 31.5–36.0)
MCV: 104.8 fL — ABNORMAL HIGH (ref 79.5–101.0)
MONO%: 6.4 % (ref 0.0–14.0)
Platelets: 223 10*3/uL (ref 145–400)
RBC: 2.71 10*6/uL — ABNORMAL LOW (ref 3.70–5.45)
RDW: 18.9 % — ABNORMAL HIGH (ref 11.2–14.5)

## 2013-08-13 NOTE — Telephone Encounter (Signed)
gv pt appt schedule for September thru November.  °

## 2013-08-13 NOTE — Progress Notes (Signed)
OFFICE PROGRESS NOTE  Interval history:  Emily Lawson is a 77 year old woman with metastatic colon cancer currently on treatment with Xeloda/Avastin. She is seen today for scheduled followup.  She cannot recall when she began or completed cycle 3 Xeloda. She thinks she may have taken the last Xeloda pills on 08/10/2013.  She denies hand/foot pain and redness. No mouth sores. No nausea or vomiting. No diarrhea. No skin rash. She feels her mouth is healing. She is tolerating soft foods without difficulty. Energy level remains poor. She denies pain, specifically no abdominal pain. She is periodically short of breath. She denies any bleeding.   Objective: Blood pressure 110/64, pulse 77, temperature 97.2 F (36.2 C), temperature source Oral, resp. rate 20, height 5\' 1"  (1.549 m), weight 199 lb 4.8 oz (90.402 kg).  Edentulous. No thrush. Lungs are clear. No wheezes or rales. Regular cardiac rhythm. Port-A-Cath site is without erythema. Abdomen is soft and nontender. No hepatomegaly. Trace lower leg edema bilaterally. Palms with hyperpigmentation. There are areas of dry desquamation beginning at the base of the thumbs bilaterally.  Lab Results: Lab Results  Component Value Date   WBC 6.7 08/13/2013   HGB 9.5* 08/13/2013   HCT 28.4* 08/13/2013   MCV 104.8* 08/13/2013   PLT 223 08/13/2013    Chemistry:    Chemistry      Component Value Date/Time   NA 142 07/19/2013 1031   NA 136 07/03/2013 0935   K 4.4 07/19/2013 1031   K 3.8 07/03/2013 0935   CL 99 07/03/2013 0935   CL 106 05/16/2013 1030   CO2 20* 07/19/2013 1031   CO2 24 07/03/2013 0935   BUN 19.3 07/19/2013 1031   BUN 26* 07/03/2013 0935   CREATININE 1.1 07/19/2013 1031   CREATININE 1.19* 07/03/2013 0935   CREATININE 1.29* 05/20/2009 1423   GLU 102* 10/19/2007 1433      Component Value Date/Time   CALCIUM 9.7 07/19/2013 1031   CALCIUM 10.0 07/03/2013 0935   ALKPHOS 181* 07/19/2013 1031   ALKPHOS 85 07/17/2012 1058   AST 27 07/19/2013 1031   AST 17  07/17/2012 1058   ALT 19 07/19/2013 1031   ALT 13 07/17/2012 1058   BILITOT 1.39* 07/19/2013 1031   BILITOT 0.4 07/17/2012 1058       Studies/Results: No results found.  Medications: I have reviewed the patient's current medications.  Assessment/Plan:  1. Metastatic colon cancer presenting with synchronous colon primaries in September, 2008, status post a hemicolectomy and liver biopsy 08/03/2007. A CT on 04/20/2010 revealed no evidence metastatic disease. She was maintained on 5-FU/leucovorin per the FOLFOX regimen beginning in April, 2009. Avasta was discontinued from the chemotherapy regimen beginning in November, 2009 due to nephrotic range proteinuria. A CT of the abdomen and pelvis 02/05/2011 revealed evidence of three small liver lesions. An MRI of the abdomen on 03/02/2011 confirmed 3 liver lesions and no additional evidence of metastatic disease. K-RAS testing on the transverse colon and left colon tumors revealed a wild type genotype.  1A. Staging PET scan 03/21/2011 confirmed 3 hypermetabolic liver lesions and no additional evidence of metastatic disease.  1B. Surgical consultation by Dr. Donell Beers was obtained and Ms. Runyon decided against an attempt at surgical resection/radiofrequency ablation.  1C. Salvage therapy with FOLFOX was initiated 04/13/2011.  1D. A restaging CT 06/28/2011 revealed a significant improvement in the hepatic metastasis.  1E. She completed eight cycles of FOLFOX chemotherapy. Treatment was switched to 5FU-leucovorin as per the FOLFOX regimen beginning with cycle #9  due to an allergic reaction (skin rash) following cycle #8.  25F. Restaging CT 10/25/2011 revealed 2 more conspicuous liver metastases .  1G. Restaging PET scan 12/24/2011 revealed an increase in the size and metabolic activity of 3 liver metastases  1H. She began FOLFIRI chemotherapy on 01/19/2012.  1 I. Restaging CT of the abdomen 05/08/2012 revealed a mixed response with enlargement of 2 liver  lesions and a decrease in the size of a third lesion, no new lesions.  1J. FOLFIRI chemotherapy continued with the addition of Avastin beginning 05/09/2012.  1K. restaging CT 07/28/2012 with no new liver lesions and a decreased size of the previously noted 3 liver lesions.  1L. She was last treated with FOLFIRI/Avastin on 09/12/2012.  6M. She completed stereotactic radiotherapy to 3 liver metastases 10/10/2012 through 10/20/2012.  1N. Follow-up MRI of the abdomen 11/24/2012 showed improvement in the 3 hepatic metastases and no progressive disease.  1O. Restaging MRI 04/09/2013 showed improvement in the 3 treated hepatic lesions and 2 new lesions. No evidence of metastatic disease outside of liver.  1P. Initiation of Xeloda/Avastin 05/30/2013. Cycle 2 initiated on 06/20/2013 (Avastin placed on hold)-Xeloda discontinued after 10 days secondary to hand/foot syndrome. Cycle 3 Xeloda question initiated on 07/28/2013 (she is unable to recall when she started or stopped cycle 3). 1. Diabetes. 2. History of proteinuria secondary to Avastin versus diabetes. 3. Mucositis and hand foot syndrome, secondary to 5FU improved with a dose reduction of the 5-FU. 4. Anemia secondary to chemotherapy.  5. Left back and left leg pain with an MRI of the lumbar spine confirming lumbar disk disease. She is status post surgery by Dr. Franky Macho 02/14/2008 with resolution of the back and left leg pain. 6. Rosacea. She is followed by dermatology. A recent flare, now maintained on doxycycline 7. Depression, maintained on Cymbalta-she requested a referral to Dr. Noe Gens 8. History of bilateral knee pain secondary to arthritis -- she takes Vicodin as needed. 9. History of anorexia -- weight loss -- ?Related to depression/anxiety or metastatic colon cancer. 10. Allergic reaction to oxaliplatin with FOLFOX on 04/13/2011. She was able to tolerate FOLFOX on 04/20/2011 with steroid premedication and a prolonged oxaliplatin infusion. After  cycle #8 she developed a rash upon returning home with pruritus. She took Benadryl with resolution of symptoms. This reaction occurred despite steroid premedication. 11. Poor dentition -- she has been evaluated by Dr. Kristin Bruins. She underwent tooth extractions 07/04/2013.  12. Neutropenia following cycle 1 of FOLFIRI. The FOLFIRI was dose reduced beginning with cycle 2. She was neutropenic 03/14/2012 and chemotherapy was held for one week.  13. Nausea and diarrhea during the irinotecan infusion on 03/21/2012. Symptoms resolved with atropine. She receives atropine as a premedication with each cycle. 14. Diarrhea following the 05/09/2012 chemotherapy cycle. Likely related to irinotecan. She takes Imodium as needed. 15. Diffuse pain 4-5 days after chemotherapy 07/17/2012-likely toxicity from Neulasta. 16. History of a fall with fracture of the right fifth metacarpal. 17. Hand/foot syndrome secondary to Xeloda-the Xeloda was dose reduced beginning with cycle 3 on 07/23/2013 (?).  Disposition-Ms. Ade has completed 3 cycles of Xeloda. Avastin has been on hold due to the recent tooth extractions. The initial plan was to proceed with cycle 4 Xeloda and resume Avastin today. However, she is unable to recall when she started or stopped cycle 3 Xeloda and thinks she may have completed it as recently as 08/10/2013. We will therefore hold cycle 4 Xeloda until 08/20/2013 and reschedule the Avastin to that day as  well. She was encouraged to write down start and stop dates and bring with her to each office visit.  We will see her in followup on 09/10/2013. She will contact the office in the interim with any problems.  Plan reviewed with Dr. Truett Perna.  Lonna Cobb ANP/GNP-BC

## 2013-08-15 ENCOUNTER — Encounter (HOSPITAL_COMMUNITY): Payer: Self-pay | Admitting: Dentistry

## 2013-08-15 ENCOUNTER — Ambulatory Visit (HOSPITAL_COMMUNITY): Payer: Self-pay | Admitting: Dentistry

## 2013-08-15 VITALS — BP 138/82 | HR 69 | Temp 97.5°F

## 2013-08-15 DIAGNOSIS — C189 Malignant neoplasm of colon, unspecified: Secondary | ICD-10-CM

## 2013-08-15 DIAGNOSIS — K0820 Unspecified atrophy of edentulous alveolar ridge: Secondary | ICD-10-CM

## 2013-08-15 DIAGNOSIS — Z09 Encounter for follow-up examination after completed treatment for conditions other than malignant neoplasm: Secondary | ICD-10-CM

## 2013-08-15 NOTE — Patient Instructions (Addendum)
Patient is to use salt water rinses as needed to aid healing. Patient is to followup with Dr. Hulan Saas (Prosthodontist) for evaluation for upper and lower complete dentures. Patient is to consider followup with [facility] for evaluation for dentures as well. Dr. Kristin Bruins

## 2013-08-15 NOTE — Progress Notes (Signed)
PERIODIC ORAL EXAMINATION:  08/15/2013 Emily Lawson [id number]  VITALS: BP 138/82  Pulse 69  Temp(Src) 97.5 F (36.4 C) (Oral)  Emily Lawson is status post multiple extractions with alveoloplasty on 07/04/2013. Patient has some delayed healing associated with the extraction sites and now returns for reevaluation of healing.   SUBJECTIVE: Patient with minimal pain or discomfort. Patient has some sore spots associated with the upper right maxilla secondary to trauma from a "LIFESAVER".  EXAM: Patient is edentulous. The patient has several small soft tissue ulcerations approximately 0.5 mm round in the area of #5 and 6 . This could be related to the chemotherapy or possible trauma as described above. . The patient has severe atrrophy of the edentulous alveolar ridges is noted.   ASSESSMENT: Post operative course is consistent with dental procedures performed in the operating room on 07/04/2013. Patient is edentulous. There is severe atrophy of the edentulous alveolar ridges.  PLAN: 1.  Continue salt water rinses every 2 hours while awake. 2.  Follow up with evaluation by Dr. Hulan Saas (prosthodontist) for evaluation for upper and lower complete dentures with or without implants.  3.  The patient also is to consider possible referral to [facility] school [facility] dentistry for denture fabrication.  4 . Follow up with Dr. Truett Perna for continued chemotherapy.   Charlynne Pander, DDS

## 2013-08-16 ENCOUNTER — Ambulatory Visit: Payer: Self-pay | Admitting: Oncology

## 2013-08-20 ENCOUNTER — Other Ambulatory Visit (HOSPITAL_BASED_OUTPATIENT_CLINIC_OR_DEPARTMENT_OTHER): Payer: Medicare Other | Admitting: Lab

## 2013-08-20 ENCOUNTER — Ambulatory Visit (HOSPITAL_BASED_OUTPATIENT_CLINIC_OR_DEPARTMENT_OTHER): Payer: Medicare Other

## 2013-08-20 VITALS — BP 116/59 | HR 74 | Temp 97.5°F | Resp 20

## 2013-08-20 DIAGNOSIS — C787 Secondary malignant neoplasm of liver and intrahepatic bile duct: Secondary | ICD-10-CM

## 2013-08-20 DIAGNOSIS — Z5112 Encounter for antineoplastic immunotherapy: Secondary | ICD-10-CM

## 2013-08-20 DIAGNOSIS — C189 Malignant neoplasm of colon, unspecified: Secondary | ICD-10-CM

## 2013-08-20 LAB — CBC WITH DIFFERENTIAL/PLATELET
Basophils Absolute: 0 10*3/uL (ref 0.0–0.1)
Eosinophils Absolute: 0.3 10*3/uL (ref 0.0–0.5)
HCT: 29 % — ABNORMAL LOW (ref 34.8–46.6)
HGB: 9.9 g/dL — ABNORMAL LOW (ref 11.6–15.9)
LYMPH%: 24 % (ref 14.0–49.7)
MCV: 107.7 fL — ABNORMAL HIGH (ref 79.5–101.0)
MONO%: 7.6 % (ref 0.0–14.0)
NEUT#: 3.9 10*3/uL (ref 1.5–6.5)
NEUT%: 63.1 % (ref 38.4–76.8)
Platelets: 179 10*3/uL (ref 145–400)
RBC: 2.69 10*6/uL — ABNORMAL LOW (ref 3.70–5.45)

## 2013-08-20 MED ORDER — HEPARIN SOD (PORK) LOCK FLUSH 100 UNIT/ML IV SOLN
500.0000 [IU] | Freq: Once | INTRAVENOUS | Status: AC | PRN
  Administered 2013-08-20: 500 [IU]
  Filled 2013-08-20: qty 5

## 2013-08-20 MED ORDER — SODIUM CHLORIDE 0.9 % IV SOLN
7.5000 mg/kg | Freq: Once | INTRAVENOUS | Status: AC
  Administered 2013-08-20: 725 mg via INTRAVENOUS
  Filled 2013-08-20: qty 29

## 2013-08-20 MED ORDER — SODIUM CHLORIDE 0.9 % IV SOLN
Freq: Once | INTRAVENOUS | Status: AC
  Administered 2013-08-20: 12:00:00 via INTRAVENOUS

## 2013-08-20 MED ORDER — SODIUM CHLORIDE 0.9 % IJ SOLN
10.0000 mL | INTRAMUSCULAR | Status: DC | PRN
  Administered 2013-08-20: 10 mL
  Filled 2013-08-20: qty 10

## 2013-08-20 NOTE — Patient Instructions (Addendum)
[facility] Discharge Instructions for Patients Receiving Chemotherapy  Today you received the following chemotherapy agents:  Avastin  To help prevent nausea and vomiting after your treatment, we encourage you to take your nausea medication as ordered per MD.   If you develop nausea and vomiting that is not controlled by your nausea medication, call the clinic.   BELOW ARE SYMPTOMS THAT SHOULD BE REPORTED IMMEDIATELY:  *FEVER GREATER THAN 100.5 F  *CHILLS WITH OR WITHOUT FEVER  NAUSEA AND VOMITING THAT IS NOT CONTROLLED WITH YOUR NAUSEA MEDICATION  *UNUSUAL SHORTNESS OF BREATH  *UNUSUAL BRUISING OR BLEEDING  TENDERNESS IN MOUTH AND THROAT WITH OR WITHOUT PRESENCE OF ULCERS  *URINARY PROBLEMS  *BOWEL PROBLEMS  UNUSUAL RASH Items with * indicate a potential emergency and should be followed up as soon as possible.  Feel free to call the clinic you have any questions or concerns. The clinic phone number is [phone].    

## 2013-08-28 ENCOUNTER — Other Ambulatory Visit: Payer: Self-pay | Admitting: *Deleted

## 2013-08-28 DIAGNOSIS — C189 Malignant neoplasm of colon, unspecified: Secondary | ICD-10-CM

## 2013-08-28 NOTE — Telephone Encounter (Signed)
THIS REFILL REQUEST FOR CAPECITABINE WAS PLACED ON DR.SHERRILL'S DESK. 

## 2013-08-29 MED ORDER — CAPECITABINE 500 MG PO TABS: 1000.0000 mg | ORAL_TABLET | Freq: Two times a day (BID) | ORAL | Status: DC

## 2013-08-29 NOTE — Addendum Note (Signed)
Addended by: Wandalee Ferdinand on: 08/29/2013 03:31 PM   Modules accepted: Orders

## 2013-09-06 NOTE — Telephone Encounter (Signed)
RECEIVED A FAX FROM [employer] CONCERNING A CONFIRMATION OF PRESCRIPTION SHIPMENT FOR CAPECITABINE ON 09/05/13.

## 2013-09-09 ENCOUNTER — Other Ambulatory Visit: Payer: Self-pay | Admitting: Oncology

## 2013-09-10 ENCOUNTER — Telehealth: Payer: Self-pay | Admitting: *Deleted

## 2013-09-10 ENCOUNTER — Ambulatory Visit: Payer: Self-pay | Admitting: Nurse Practitioner

## 2013-09-10 ENCOUNTER — Other Ambulatory Visit: Payer: Self-pay | Admitting: Lab

## 2013-09-10 ENCOUNTER — Other Ambulatory Visit: Payer: Self-pay | Admitting: *Deleted

## 2013-09-10 ENCOUNTER — Ambulatory Visit: Payer: Self-pay

## 2013-09-10 DIAGNOSIS — C189 Malignant neoplasm of colon, unspecified: Secondary | ICD-10-CM

## 2013-09-10 MED ORDER — PROCHLORPERAZINE MALEATE 10 MG PO TABS: 10.0000 mg | ORAL_TABLET | Freq: Four times a day (QID) | ORAL | Status: DC | PRN

## 2013-09-10 NOTE — Telephone Encounter (Signed)
Reports nausea since last night with one episode of vomiting this morning. No diarrhea or abdominal cramping. Reports "I feel cold"-no shaking chills and reports her temp is 95.7 (checked X 2). Has felt weak past 2 days and wants to sleep all the time. Asking nurse "Why am I so sleepy and tired all the time?".  Has out of date compazine in home. Will refill her compazine. Instructed her to push po fluids and take antiemetic as needed. Monitor temp and call for fever 101 or more. Advance diet slowly and to monitor her blood sugars. Will reschedule her for later in week. She will hold off on her Xeloda for now.

## 2013-09-11 ENCOUNTER — Telehealth: Payer: Self-pay | Admitting: *Deleted

## 2013-09-11 NOTE — Telephone Encounter (Signed)
Per staff phone call and message I have scheduled appts. Patient aware

## 2013-09-12 ENCOUNTER — Other Ambulatory Visit: Payer: Self-pay | Admitting: Lab

## 2013-09-12 ENCOUNTER — Ambulatory Visit: Payer: Self-pay

## 2013-09-12 ENCOUNTER — Ambulatory Visit: Payer: Self-pay | Admitting: Oncology

## 2013-09-13 ENCOUNTER — Other Ambulatory Visit: Payer: Self-pay | Admitting: Oncology

## 2013-09-13 ENCOUNTER — Telehealth: Payer: Self-pay | Admitting: *Deleted

## 2013-09-13 NOTE — Telephone Encounter (Signed)
Called patient back with appointment for lab/avastin tomorrow at 1130 and she was able to repeat date/time to nurse. Also called daughter to facilitate her being here. Emily Lawson said she is feeling better today and is ready for treatment.

## 2013-09-13 NOTE — Telephone Encounter (Signed)
[facility] 09/12/13 for L/OV/chemo. Called patient and she thinks she was told to come in on Friday at 0915. Per Dr. Truett Perna, OK to have treatment on 10/17 and he will see her when next tx due.

## 2013-09-14 ENCOUNTER — Other Ambulatory Visit (HOSPITAL_BASED_OUTPATIENT_CLINIC_OR_DEPARTMENT_OTHER): Payer: Medicare Other | Admitting: Lab

## 2013-09-14 ENCOUNTER — Ambulatory Visit (HOSPITAL_BASED_OUTPATIENT_CLINIC_OR_DEPARTMENT_OTHER): Payer: Medicare Other

## 2013-09-14 VITALS — BP 110/68 | HR 72 | Temp 98.4°F | Resp 20

## 2013-09-14 DIAGNOSIS — C189 Malignant neoplasm of colon, unspecified: Secondary | ICD-10-CM

## 2013-09-14 DIAGNOSIS — Z5112 Encounter for antineoplastic immunotherapy: Secondary | ICD-10-CM

## 2013-09-14 LAB — CBC WITH DIFFERENTIAL/PLATELET
Basophils Absolute: 0.1 10*3/uL (ref 0.0–0.1)
EOS%: 5.8 % (ref 0.0–7.0)
Eosinophils Absolute: 0.4 10*3/uL (ref 0.0–0.5)
HGB: 9.7 g/dL — ABNORMAL LOW (ref 11.6–15.9)
LYMPH%: 22.3 % (ref 14.0–49.7)
MCV: 109.3 fL — ABNORMAL HIGH (ref 79.5–101.0)
MONO%: 7.2 % (ref 0.0–14.0)
NEUT#: 4.2 10*3/uL (ref 1.5–6.5)
NEUT%: 63.7 % (ref 38.4–76.8)
Platelets: 189 10*3/uL (ref 145–400)
RBC: 2.63 10*6/uL — ABNORMAL LOW (ref 3.70–5.45)

## 2013-09-14 LAB — UA PROTEIN, DIPSTICK - CHCC: Protein, ur: <30 mg/dL

## 2013-09-14 LAB — COMPREHENSIVE METABOLIC PANEL (CC13)
ALT: 19 U/L (ref 0–55)
Albumin: 3 g/dL — ABNORMAL LOW (ref 3.5–5.0)
Alkaline Phosphatase: 154 U/L — ABNORMAL HIGH (ref 40–150)
Anion Gap: 14 mEq/L — ABNORMAL HIGH (ref 3–11)
BUN: 23.6 mg/dL (ref 7.0–26.0)
CO2: 19 mEq/L — ABNORMAL LOW (ref 22–29)
Creatinine: 1.2 mg/dL — ABNORMAL HIGH (ref 0.6–1.1)
Glucose: 278 mg/dl — ABNORMAL HIGH (ref 70–140)
Sodium: 141 mEq/L (ref 136–145)
Total Bilirubin: 1.86 mg/dL — ABNORMAL HIGH (ref 0.20–1.20)

## 2013-09-14 MED ORDER — SODIUM CHLORIDE 0.9 % IV SOLN
7.5000 mg/kg | Freq: Once | INTRAVENOUS | Status: AC
  Administered 2013-09-14: 725 mg via INTRAVENOUS
  Filled 2013-09-14: qty 29

## 2013-09-14 MED ORDER — HEPARIN SOD (PORK) LOCK FLUSH 100 UNIT/ML IV SOLN
500.0000 [IU] | Freq: Once | INTRAVENOUS | Status: AC | PRN
  Administered 2013-09-14: 500 [IU]
  Filled 2013-09-14: qty 5

## 2013-09-14 MED ORDER — SODIUM CHLORIDE 0.9 % IV SOLN
Freq: Once | INTRAVENOUS | Status: AC
  Administered 2013-09-14: 12:00:00 via INTRAVENOUS

## 2013-09-14 MED ORDER — SODIUM CHLORIDE 0.9 % IJ SOLN
10.0000 mL | INTRAMUSCULAR | Status: DC | PRN
  Administered 2013-09-14: 10 mL
  Filled 2013-09-14: qty 10

## 2013-09-14 NOTE — Patient Instructions (Signed)
[facility] Discharge Instructions for Patients Receiving Chemotherapy  Today you received the following chemotherapy agents Avastin.  To help prevent nausea and vomiting after your treatment, we encourage you to take your nausea medication as prescribed.   If you develop nausea and vomiting that is not controlled by your nausea medication, call the clinic.   BELOW ARE SYMPTOMS THAT SHOULD BE REPORTED IMMEDIATELY:  *FEVER GREATER THAN 100.5 F  *CHILLS WITH OR WITHOUT FEVER  NAUSEA AND VOMITING THAT IS NOT CONTROLLED WITH YOUR NAUSEA MEDICATION  *UNUSUAL SHORTNESS OF BREATH  *UNUSUAL BRUISING OR BLEEDING  TENDERNESS IN MOUTH AND THROAT WITH OR WITHOUT PRESENCE OF ULCERS  *URINARY PROBLEMS  *BOWEL PROBLEMS  UNUSUAL RASH Items with * indicate a potential emergency and should be followed up as soon as possible.  Feel free to call the clinic you have any questions or concerns. The clinic phone number is [phone].    

## 2013-09-20 ENCOUNTER — Telehealth: Payer: Self-pay | Admitting: *Deleted

## 2013-09-20 NOTE — Telephone Encounter (Signed)
Call from pt asking when she should begin next round of Xeloda? Reports she took last dose around 10/1. Instructed pt to wait until seen by MD. Bring all Xeloda tablets to next office visit 11/3. Pt wrote appt down and voiced understanding.

## 2013-09-30 ENCOUNTER — Other Ambulatory Visit: Payer: Self-pay | Admitting: Oncology

## 2013-10-01 ENCOUNTER — Other Ambulatory Visit (HOSPITAL_BASED_OUTPATIENT_CLINIC_OR_DEPARTMENT_OTHER): Payer: Medicare Other | Admitting: Lab

## 2013-10-01 ENCOUNTER — Ambulatory Visit (HOSPITAL_BASED_OUTPATIENT_CLINIC_OR_DEPARTMENT_OTHER): Payer: Medicare Other | Admitting: Oncology

## 2013-10-01 ENCOUNTER — Ambulatory Visit (HOSPITAL_BASED_OUTPATIENT_CLINIC_OR_DEPARTMENT_OTHER): Payer: Medicare Other

## 2013-10-01 VITALS — BP 153/63 | HR 82 | Temp 97.5°F | Resp 20 | Ht 61.0 in | Wt 207.5 lb

## 2013-10-01 VITALS — BP 155/66

## 2013-10-01 DIAGNOSIS — D6481 Anemia due to antineoplastic chemotherapy: Secondary | ICD-10-CM

## 2013-10-01 DIAGNOSIS — R609 Edema, unspecified: Secondary | ICD-10-CM

## 2013-10-01 DIAGNOSIS — C189 Malignant neoplasm of colon, unspecified: Secondary | ICD-10-CM

## 2013-10-01 DIAGNOSIS — C787 Secondary malignant neoplasm of liver and intrahepatic bile duct: Secondary | ICD-10-CM

## 2013-10-01 DIAGNOSIS — F329 Major depressive disorder, single episode, unspecified: Secondary | ICD-10-CM

## 2013-10-01 DIAGNOSIS — F3289 Other specified depressive episodes: Secondary | ICD-10-CM

## 2013-10-01 DIAGNOSIS — Z5112 Encounter for antineoplastic immunotherapy: Secondary | ICD-10-CM

## 2013-10-01 LAB — CBC WITH DIFFERENTIAL/PLATELET
BASO%: 0.5 % (ref 0.0–2.0)
Basophils Absolute: 0 10*3/uL (ref 0.0–0.1)
EOS%: 6.5 % (ref 0.0–7.0)
HCT: 30.6 % — ABNORMAL LOW (ref 34.8–46.6)
HGB: 10.2 g/dL — ABNORMAL LOW (ref 11.6–15.9)
LYMPH%: 23.2 % (ref 14.0–49.7)
MCH: 34.8 pg — ABNORMAL HIGH (ref 25.1–34.0)
MCHC: 33.3 g/dL (ref 31.5–36.0)
MCV: 104.4 fL — ABNORMAL HIGH (ref 79.5–101.0)
NEUT#: 4.6 10*3/uL (ref 1.5–6.5)
NEUT%: 62.9 % (ref 38.4–76.8)
Platelets: 218 10*3/uL (ref 145–400)

## 2013-10-01 LAB — COMPREHENSIVE METABOLIC PANEL (CC13)
AST: 28 U/L (ref 5–34)
Alkaline Phosphatase: 154 U/L — ABNORMAL HIGH (ref 40–150)
BUN: 23.4 mg/dL (ref 7.0–26.0)
Calcium: 9.5 mg/dL (ref 8.4–10.4)
Creatinine: 1.2 mg/dL — ABNORMAL HIGH (ref 0.6–1.1)
Glucose: 168 mg/dl — ABNORMAL HIGH (ref 70–140)
Total Bilirubin: 1.43 mg/dL — ABNORMAL HIGH (ref 0.20–1.20)

## 2013-10-01 LAB — UA PROTEIN, DIPSTICK - CHCC: Protein, ur: 30 mg/dL

## 2013-10-01 MED ORDER — SODIUM CHLORIDE 0.9 % IJ SOLN
10.0000 mL | INTRAMUSCULAR | Status: DC | PRN
  Administered 2013-10-01: 10 mL
  Filled 2013-10-01: qty 10

## 2013-10-01 MED ORDER — SODIUM CHLORIDE 0.9 % IV SOLN
Freq: Once | INTRAVENOUS | Status: AC
  Administered 2013-10-01: 13:00:00 via INTRAVENOUS

## 2013-10-01 MED ORDER — SODIUM CHLORIDE 0.9 % IV SOLN
7.5000 mg/kg | Freq: Once | INTRAVENOUS | Status: AC
  Administered 2013-10-01: 725 mg via INTRAVENOUS
  Filled 2013-10-01: qty 29

## 2013-10-01 MED ORDER — HEPARIN SOD (PORK) LOCK FLUSH 100 UNIT/ML IV SOLN
500.0000 [IU] | Freq: Once | INTRAVENOUS | Status: AC | PRN
  Administered 2013-10-01: 500 [IU]
  Filled 2013-10-01: qty 5

## 2013-10-01 NOTE — Progress Notes (Signed)
[facility]    OFFICE PROGRESS NOTE   INTERVAL HISTORY:   [name] returns for scheduled followup of metastatic colon cancer. She was most recently treated with Avastin on 09/14/2013. No bleeding or symptom of thrombosis. She complains of malaise. No mouth sores or diarrhea. She has developed increased bilateral leg edema. She did not take Xeloda with the most recent cycle of chemotherapy.  Objective:  Vital signs in last 24 hours:  Blood pressure 153/63, pulse 82, temperature 97.5 F (36.4 C), temperature source Oral, resp. rate 20, height 5\' 1"  (1.549 m), weight 207 lb 8 oz (94.121 kg).    HEENT: No thrush or ulcers, edentulous. Resp: Lungs clear bilaterally Cardio: Regular rate and rhythm GI: No hepatomegaly, no mass Vascular: 1+ pitting edema at the foot and lower leg bilaterally.  Skin: Dry desquamation and mild hyperpigmentation over the hands and feet.   Portacath/PICC-without erythema  Lab Results:  Lab Results  Component Value Date   WBC 7.4 10/01/2013   HGB 10.2* 10/01/2013   HCT 30.6* 10/01/2013   MCV 104.4* 10/01/2013   PLT 218 10/01/2013   ANC 4.6  CEA on 08/20/2013-7.0  Medications: I have reviewed the patient's current medications.  Assessment/Plan: 1. Metastatic colon cancer presenting with synchronous colon primaries in September, 2008, status post a hemicolectomy and liver biopsy 08/03/2007. A CT on 04/20/2010 revealed no evidence metastatic disease. She was maintained on 5-FU/leucovorin per the FOLFOX regimen beginning in April, 2009. Avasta was discontinued from the chemotherapy regimen beginning in November, 2009 due to nephrotic range proteinuria. A CT of the abdomen and pelvis 02/05/2011 revealed evidence of three small liver lesions. An MRI of the abdomen on 03/02/2011 confirmed 3 liver lesions and no additional evidence of metastatic disease. K-RAS testing on the transverse colon and left colon tumors revealed a wild type genotype.    1A. Staging PET scan 03/21/2011 confirmed 3 hypermetabolic liver lesions and no additional evidence of metastatic disease.  1B. Surgical consultation by Dr. Donell Beers was obtained and Emily Lawson decided against an attempt at surgical resection/radiofrequency ablation.  1C. Salvage therapy with FOLFOX was initiated 04/13/2011.  1D. A restaging CT 06/28/2011 revealed a significant improvement in the hepatic metastasis.  1E. She completed eight cycles of FOLFOX chemotherapy. Treatment was switched to 5FU-leucovorin as per the FOLFOX regimen beginning with cycle #9 due to an allergic reaction (skin rash) following cycle #8.  43F. Restaging CT 10/25/2011 revealed 2 more conspicuous liver metastases .  1G. Restaging PET scan 12/24/2011 revealed an increase in the size and metabolic activity of 3 liver metastases  1H. She began FOLFIRI chemotherapy on 01/19/2012.  1 I. Restaging CT of the abdomen 05/08/2012 revealed a mixed response with enlargement of 2 liver lesions and a decrease in the size of a third lesion, no new lesions.  1J. FOLFIRI chemotherapy continued with the addition of Avastin beginning 05/09/2012.  1K. restaging CT 07/28/2012 with no new liver lesions and a decreased size of the previously noted 3 liver lesions.  1L. She was last treated with FOLFIRI/Avastin on 09/12/2012.  47M. She completed stereotactic radiotherapy to 3 liver metastases 10/10/2012 through 10/20/2012.  1N. Follow-up MRI of the abdomen 11/24/2012 showed improvement in the 3 hepatic metastases and no progressive disease.  1O. Restaging MRI 04/09/2013 showed improvement in the 3 treated hepatic lesions and 2 new lesions. No evidence of metastatic disease outside of liver.  1P. Initiation of Xeloda/Avastin 05/30/2013. Cycle 2 initiated on 06/20/2013 (Avastin placed on hold)-Xeloda  discontinued after 10 days secondary to hand/foot syndrome. Cycle 3 Xeloda question initiated on 07/28/2013 (she is unable to recall when she started or  stopped cycle 3). Cycle 4 08/20/2013. Cycle 5 09/14/2013 (no Xeloda) 1. Diabetes. 2. History of proteinuria secondary to Avastin versus diabetes. 3. Mucositis and hand foot syndrome, secondary to 5FU improved with a dose reduction of the 5-FU. 4. Anemia secondary to chemotherapy.  5. Left back and left leg pain with an MRI of the lumbar spine confirming lumbar disk disease. She is status post surgery by Dr. Franky Macho 02/14/2008 with resolution of the back and left leg pain. 6. Rosacea. She is followed by dermatology. A recent flare, now maintained on doxycycline 7. Depression, maintained on Cymbalta-she requested a referral to Dr. Noe Gens 8. History of bilateral knee pain secondary to arthritis -- she takes Vicodin as needed. 9. History of anorexia -- weight loss -- ?Related to depression/anxiety or metastatic colon cancer. 10. Allergic reaction to oxaliplatin with FOLFOX on 04/13/2011. She was able to tolerate FOLFOX on 04/20/2011 with steroid premedication and a prolonged oxaliplatin infusion. After cycle #8 she developed a rash upon returning home with pruritus. She took Benadryl with resolution of symptoms. This reaction occurred despite steroid premedication. 11. Poor dentition -- she was evaluated by Dr. Kristin Bruins. She underwent tooth extractions 07/04/2013.  12. Neutropenia following cycle 1 of FOLFIRI. The FOLFIRI was dose reduced beginning with cycle 2. She was neutropenic 03/14/2012 and chemotherapy was held for one week.  13. Nausea and diarrhea during the irinotecan infusion on 03/21/2012. Symptoms resolved with atropine. She receives atropine as a premedication with each cycle. 14. Diarrhea following the 05/09/2012 chemotherapy cycle. Likely related to irinotecan. She takes Imodium as needed. 15. Diffuse pain 4-5 days after chemotherapy 07/17/2012-likely toxicity from Neulasta. 16. History of a fall with fracture of the right fifth metacarpal. 17. Hand/foot syndrome secondary to Xeloda-the  Xeloda was dose reduced beginning with cycle 3 on 07/23/2013 (?). 18. Foot/low leg edema-? Etiology. We will followup on the chemistry panel and urinalysis from today.  Disposition:  Her overall status appears unchanged. The plan is to proceed with another cycle of Xeloda/Avastin today. We will check a CEA when she returns in 3 weeks.  It is possible the leg edema is related to renal toxicity from Avastin, amlodipine, or progression of the tumor burden in the liver. We will check a chemistry panel and urinalysis today. The plan is to obtain a restaging CT evaluation after 2-3 more cycles of Xeloda/Avastin.   Thornton Papas, MD  10/01/2013  1:48 PM

## 2013-10-01 NOTE — Patient Instructions (Signed)
[facility] Discharge Instructions for Patients Receiving Chemotherapy  Today you received the following chemotherapy agents  avastin  To help prevent nausea and vomiting after your treatment, we encourage you to take your nausea medication.   If you develop nausea and vomiting that is not controlled by your nausea medication, call the clinic.   BELOW ARE SYMPTOMS THAT SHOULD BE REPORTED IMMEDIATELY:  *FEVER GREATER THAN 100.5 F  *CHILLS WITH OR WITHOUT FEVER  NAUSEA AND VOMITING THAT IS NOT CONTROLLED WITH YOUR NAUSEA MEDICATION  *UNUSUAL SHORTNESS OF BREATH  *UNUSUAL BRUISING OR BLEEDING  TENDERNESS IN MOUTH AND THROAT WITH OR WITHOUT PRESENCE OF ULCERS  *URINARY PROBLEMS  *BOWEL PROBLEMS  UNUSUAL RASH Items with * indicate a potential emergency and should be followed up as soon as possible.  Feel free to call the clinic you have any questions or concerns. The clinic phone number is [phone].    

## 2013-10-02 ENCOUNTER — Telehealth: Payer: Self-pay | Admitting: Nurse Practitioner

## 2013-10-02 ENCOUNTER — Telehealth: Payer: Self-pay | Admitting: *Deleted

## 2013-10-02 NOTE — Telephone Encounter (Signed)
Per 11/4 POF appts made for 11/24 Email to MW to add tx  shh

## 2013-10-02 NOTE — Telephone Encounter (Signed)
Message copied by Caleb Popp on Tue Oct 02, 2013  1:55 PM ------      Message from: Thornton Papas B      Created: Mon Oct 01, 2013  6:25 PM       Copy chem. Panel to primary MD.      ? Increased creatinine related to medictions ------

## 2013-10-02 NOTE — Telephone Encounter (Signed)
Labs routed to PCP.

## 2013-10-03 ENCOUNTER — Telehealth: Payer: Self-pay | Admitting: *Deleted

## 2013-10-03 NOTE — Telephone Encounter (Signed)
Per staff message and POF I have scheduled appts.  JMW  

## 2013-10-15 ENCOUNTER — Encounter: Payer: Self-pay | Admitting: Oncology

## 2013-10-15 NOTE — Progress Notes (Signed)
Per [employer] patient has been approved for Xeloda 05/29/13-05/28/14.

## 2013-10-21 ENCOUNTER — Other Ambulatory Visit: Payer: Self-pay | Admitting: Oncology

## 2013-10-22 ENCOUNTER — Ambulatory Visit: Payer: Self-pay | Admitting: Nurse Practitioner

## 2013-10-22 ENCOUNTER — Other Ambulatory Visit: Payer: Self-pay | Admitting: Lab

## 2013-10-22 ENCOUNTER — Ambulatory Visit: Payer: Self-pay

## 2013-10-23 ENCOUNTER — Telehealth: Payer: Self-pay | Admitting: Oncology

## 2013-10-26 ENCOUNTER — Ambulatory Visit: Payer: Self-pay

## 2013-10-26 ENCOUNTER — Ambulatory Visit: Payer: Self-pay | Admitting: Nurse Practitioner

## 2013-10-26 ENCOUNTER — Other Ambulatory Visit: Payer: Self-pay | Admitting: Lab

## 2013-10-30 ENCOUNTER — Other Ambulatory Visit (HOSPITAL_BASED_OUTPATIENT_CLINIC_OR_DEPARTMENT_OTHER): Payer: Medicare Other | Admitting: Lab

## 2013-10-30 ENCOUNTER — Telehealth: Payer: Self-pay | Admitting: Oncology

## 2013-10-30 ENCOUNTER — Telehealth: Payer: Self-pay | Admitting: *Deleted

## 2013-10-30 ENCOUNTER — Ambulatory Visit (HOSPITAL_BASED_OUTPATIENT_CLINIC_OR_DEPARTMENT_OTHER): Payer: Medicare Other | Admitting: Nurse Practitioner

## 2013-10-30 VITALS — BP 132/65 | HR 73 | Temp 96.7°F | Resp 20 | Ht 61.0 in | Wt 204.8 lb

## 2013-10-30 DIAGNOSIS — C189 Malignant neoplasm of colon, unspecified: Secondary | ICD-10-CM

## 2013-10-30 DIAGNOSIS — C787 Secondary malignant neoplasm of liver and intrahepatic bile duct: Secondary | ICD-10-CM

## 2013-10-30 LAB — COMPREHENSIVE METABOLIC PANEL (CC13)
ALT: 34 U/L (ref 0–55)
AST: 35 U/L — ABNORMAL HIGH (ref 5–34)
Albumin: 3.2 g/dL — ABNORMAL LOW (ref 3.5–5.0)
Anion Gap: 14 mEq/L — ABNORMAL HIGH (ref 3–11)
Calcium: 9.7 mg/dL (ref 8.4–10.4)
Chloride: 106 mEq/L (ref 98–109)
Creatinine: 1.3 mg/dL — ABNORMAL HIGH (ref 0.6–1.1)
Glucose: 278 mg/dl — ABNORMAL HIGH (ref 70–140)
Potassium: 4.3 mEq/L (ref 3.5–5.1)
Sodium: 141 mEq/L (ref 136–145)
Total Protein: 7 g/dL (ref 6.4–8.3)

## 2013-10-30 LAB — CBC WITH DIFFERENTIAL/PLATELET
BASO%: 1.1 % (ref 0.0–2.0)
Basophils Absolute: 0.1 10*3/uL (ref 0.0–0.1)
EOS%: 5.6 % (ref 0.0–7.0)
Eosinophils Absolute: 0.4 10*3/uL (ref 0.0–0.5)
HGB: 11.2 g/dL — ABNORMAL LOW (ref 11.6–15.9)
MCH: 36.4 pg — ABNORMAL HIGH (ref 25.1–34.0)
MCHC: 33.8 g/dL (ref 31.5–36.0)
MONO#: 0.4 10*3/uL (ref 0.1–0.9)
NEUT%: 66.6 % (ref 38.4–76.8)
RDW: 19.5 % — ABNORMAL HIGH (ref 11.2–14.5)
WBC: 6.6 10*3/uL (ref 3.9–10.3)
lymph#: 1.4 10*3/uL (ref 0.9–3.3)

## 2013-10-30 NOTE — Telephone Encounter (Signed)
Per staff message and POF I have scheduled appts.  JMW  

## 2013-10-30 NOTE — Telephone Encounter (Signed)
appts made per 12/2 POF email to MW to add tx for 12/5 and promised pt we would call w time AVS and CAL printed shh

## 2013-10-30 NOTE — Progress Notes (Signed)
OFFICE PROGRESS NOTE  Interval history:  Emily Lawson returns for followup of metastatic colon cancer. She has missed several recent followup visits. She was last treated with Avastin 10/01/2013. She believes she began a cycle of Xeloda 10/01/2013 as well. She has had a few episodes of nausea/vomiting. No mouth sores. No diarrhea. She denies hand or foot pain or redness. She denies any bleeding except occasional bleeding related to hemorrhoids. Leg edema is better. She denies chest pain and shortness of breath. No abdominal pain. Her main complaint is a lack of energy.   Objective: Blood pressure 132/65, pulse 73, temperature 96.7 F (35.9 C), temperature source Oral, resp. rate 20, height 5\' 1"  (1.549 m), weight 204 lb 12.8 oz (92.897 kg).  No thrush or ulcerations. Edentulous. Lungs are clear. Regular cardiac rhythm. Abdomen soft and nontender. No hepatomegaly, no mass. Trace pitting edema at the lower legs bilaterally.  Lab Results: Lab Results  Component Value Date   WBC 6.6 10/30/2013   HGB 11.2* 10/30/2013   HCT 33.0* 10/30/2013   MCV 107.7* 10/30/2013   PLT 196 10/30/2013    Chemistry:    Chemistry      Component Value Date/Time   NA 141 10/30/2013 1331   NA 136 07/03/2013 0935   K 4.3 10/30/2013 1331   K 3.8 07/03/2013 0935   CL 99 07/03/2013 0935   CL 106 05/16/2013 1030   CO2 21* 10/30/2013 1331   CO2 24 07/03/2013 0935   BUN 25.9 10/30/2013 1331   BUN 26* 07/03/2013 0935   CREATININE 1.3* 10/30/2013 1331   CREATININE 1.19* 07/03/2013 0935   CREATININE 1.29* 05/20/2009 1423   GLU 102* 10/19/2007 1433      Component Value Date/Time   CALCIUM 9.7 10/30/2013 1331   CALCIUM 10.0 07/03/2013 0935   ALKPHOS 176* 10/30/2013 1331   ALKPHOS 85 07/17/2012 1058   AST 35* 10/30/2013 1331   AST 17 07/17/2012 1058   ALT 34 10/30/2013 1331   ALT 13 07/17/2012 1058   BILITOT 1.75* 10/30/2013 1331   BILITOT 0.4 07/17/2012 1058       Studies/Results: No results found.  Medications: I have reviewed the  patient's current medications.  Assessment/Plan:  1. Metastatic colon cancer presenting with synchronous colon primaries in September, 2008, status post a hemicolectomy and liver biopsy 08/03/2007. A CT on 04/20/2010 revealed no evidence metastatic disease. She was maintained on 5-FU/leucovorin per the FOLFOX regimen beginning in April, 2009. Avasta was discontinued from the chemotherapy regimen beginning in November, 2009 due to nephrotic range proteinuria. A CT of the abdomen and pelvis 02/05/2011 revealed evidence of three small liver lesions. An MRI of the abdomen on 03/02/2011 confirmed 3 liver lesions and no additional evidence of metastatic disease. K-RAS testing on the transverse colon and left colon tumors revealed a wild type genotype.  1A. Staging PET scan 03/21/2011 confirmed 3 hypermetabolic liver lesions and no additional evidence of metastatic disease.  1B. Surgical consultation by Dr. Donell Beers was obtained and Emily Lawson decided against an attempt at surgical resection/radiofrequency ablation.  1C. Salvage therapy with FOLFOX was initiated 04/13/2011.  1D. A restaging CT 06/28/2011 revealed a significant improvement in the hepatic metastasis.  1E. She completed eight cycles of FOLFOX chemotherapy. Treatment was switched to 5FU-leucovorin as per the FOLFOX regimen beginning with cycle #9 due to an allergic reaction (skin rash) following cycle #8.  57F. Restaging CT 10/25/2011 revealed 2 more conspicuous liver metastases .  1G. Restaging PET scan 12/24/2011 revealed an increase  in the size and metabolic activity of 3 liver metastases  1H. She began FOLFIRI chemotherapy on 01/19/2012.  1 I. Restaging CT of the abdomen 05/08/2012 revealed a mixed response with enlargement of 2 liver lesions and a decrease in the size of a third lesion, no new lesions.  1J. FOLFIRI chemotherapy continued with the addition of Avastin beginning 05/09/2012.  1K. restaging CT 07/28/2012 with no new liver lesions  and a decreased size of the previously noted 3 liver lesions.  1L. She was last treated with FOLFIRI/Avastin on 09/12/2012.  26M. She completed stereotactic radiotherapy to 3 liver metastases 10/10/2012 through 10/20/2012.  1N. Follow-up MRI of the abdomen 11/24/2012 showed improvement in the 3 hepatic metastases and no progressive disease.  1O. Restaging MRI 04/09/2013 showed improvement in the 3 treated hepatic lesions and 2 new lesions. No evidence of metastatic disease outside of liver.  1P. Initiation of Xeloda/Avastin 05/30/2013. Cycle 2 initiated on 06/20/2013 (Avastin placed on hold)-Xeloda discontinued after 10 days secondary to hand/foot syndrome. Cycle 3 Xeloda question initiated on 07/28/2013 (she is unable to recall when she started or stopped cycle 3). Cycle 4 08/20/2013. Cycle 5 09/14/2013 (no Xeloda). Cycle 6 Xeloda/Avastin 10/01/2013.  1. Diabetes. 2. History of proteinuria secondary to Avastin versus diabetes. 3. Mucositis and hand foot syndrome, secondary to 5FU improved with a dose reduction of the 5-FU. 4. Anemia secondary to chemotherapy.  5. Left back and left leg pain with an MRI of the lumbar spine confirming lumbar disk disease. She is status post surgery by Dr. Franky Macho 02/14/2008 with resolution of the back and left leg pain. 6. Rosacea. She is followed by dermatology. A recent flare, now maintained on doxycycline 7. Depression, maintained on Cymbalta-she requested a referral to Dr. Noe Gens 8. History of bilateral knee pain secondary to arthritis -- she takes Vicodin as needed. 9. History of anorexia -- weight loss -- ?Related to depression/anxiety or metastatic colon cancer. 10. Allergic reaction to oxaliplatin with FOLFOX on 04/13/2011. She was able to tolerate FOLFOX on 04/20/2011 with steroid premedication and a prolonged oxaliplatin infusion. After cycle #8 she developed a rash upon returning home with pruritus. She took Benadryl with resolution of symptoms. This reaction  occurred despite steroid premedication. 11. Poor dentition -- she was evaluated by Dr. Kristin Bruins. She underwent tooth extractions 07/04/2013.  12. Neutropenia following cycle 1 of FOLFIRI. The FOLFIRI was dose reduced beginning with cycle 2. She was neutropenic 03/14/2012 and chemotherapy was held for one week.  13. Nausea and diarrhea during the irinotecan infusion on 03/21/2012. Symptoms resolved with atropine. She receives atropine as a premedication with each cycle. 14. Diarrhea following the 05/09/2012 chemotherapy cycle. Likely related to irinotecan. She takes Imodium as needed. 15. Diffuse pain 4-5 days after chemotherapy 07/17/2012-likely toxicity from Neulasta. 16. History of a fall with fracture of the right fifth metacarpal. 17. Hand/foot syndrome secondary to Xeloda-the Xeloda was dose reduced beginning with cycle 3 on 07/23/2013 (?). 18. Foot/low leg edema-? Etiology. Improved. 19. Fatigue.  Disposition-she appears stable. She will return on 11/02/2013 for Avastin with plans to begin the next cycle of Xeloda that day as well. Dr. Truett Perna then recommends a restaging MRI of the liver which is scheduled on 11/16/2013. She will return for a followup visit on 11/23/2013 to review the results of the MRI. She will contact the office in the interim with any problems.  Plan reviewed with Dr. Truett Perna.  Lonna Cobb ANP/GNP-BC

## 2013-10-30 NOTE — Telephone Encounter (Signed)
sw pt advi MW added 12/5 tx and tome was 2pm Understanding voiced by pt shh

## 2013-10-30 NOTE — Telephone Encounter (Signed)
Called [facility] requesting refill for Xeloda be sent to patient.  Per Ebbie Ridge. Maisie Fus, NP

## 2013-10-31 ENCOUNTER — Telehealth: Payer: Self-pay | Admitting: *Deleted

## 2013-10-31 NOTE — Telephone Encounter (Signed)
Message copied by Caleb Popp on Wed Oct 31, 2013  9:11 AM ------      Message from: Ladene Artist      Created: Tue Oct 30, 2013  6:54 PM       Be sure creatinine is ok for contrast MRI ------

## 2013-10-31 NOTE — Telephone Encounter (Signed)
Per Kendal Hymen in MRI Creatinine of 1.3 is OK for pt to have MRI with contrast.

## 2013-11-01 ENCOUNTER — Encounter: Payer: Self-pay | Admitting: *Deleted

## 2013-11-01 NOTE — Progress Notes (Signed)
RECEIVED A FAX FROM [facility] CONCERNING A CONFIRMATION OF PRESCRIPTION SHIPMENT FOR CAPECITABINE ON 10/31/13.

## 2013-11-02 ENCOUNTER — Encounter (INDEPENDENT_AMBULATORY_CARE_PROVIDER_SITE_OTHER): Payer: Self-pay

## 2013-11-02 ENCOUNTER — Other Ambulatory Visit: Payer: Self-pay | Admitting: Oncology

## 2013-11-02 ENCOUNTER — Ambulatory Visit (HOSPITAL_BASED_OUTPATIENT_CLINIC_OR_DEPARTMENT_OTHER): Payer: Medicare Other | Admitting: Lab

## 2013-11-02 ENCOUNTER — Ambulatory Visit (HOSPITAL_BASED_OUTPATIENT_CLINIC_OR_DEPARTMENT_OTHER): Payer: Medicare Other

## 2013-11-02 VITALS — BP 144/70 | HR 70 | Temp 97.7°F | Resp 18

## 2013-11-02 DIAGNOSIS — C787 Secondary malignant neoplasm of liver and intrahepatic bile duct: Secondary | ICD-10-CM

## 2013-11-02 DIAGNOSIS — C189 Malignant neoplasm of colon, unspecified: Secondary | ICD-10-CM

## 2013-11-02 DIAGNOSIS — Z5112 Encounter for antineoplastic immunotherapy: Secondary | ICD-10-CM

## 2013-11-02 MED ORDER — SODIUM CHLORIDE 0.9 % IV SOLN
7.5000 mg/kg | Freq: Once | INTRAVENOUS | Status: AC
  Administered 2013-11-02: 725 mg via INTRAVENOUS
  Filled 2013-11-02: qty 29

## 2013-11-02 MED ORDER — HEPARIN SOD (PORK) LOCK FLUSH 100 UNIT/ML IV SOLN
500.0000 [IU] | Freq: Once | INTRAVENOUS | Status: AC | PRN
  Administered 2013-11-02: 500 [IU]
  Filled 2013-11-02: qty 5

## 2013-11-02 MED ORDER — SODIUM CHLORIDE 0.9 % IJ SOLN
10.0000 mL | INTRAMUSCULAR | Status: DC | PRN
  Administered 2013-11-02: 10 mL
  Filled 2013-11-02: qty 10

## 2013-11-02 MED ORDER — SODIUM CHLORIDE 0.9 % IV SOLN
Freq: Once | INTRAVENOUS | Status: AC
  Administered 2013-11-02: 15:00:00 via INTRAVENOUS

## 2013-11-02 NOTE — Patient Instructions (Signed)
[facility] Discharge Instructions for Patients Receiving Chemotherapy  Today you received the following chemotherapy agents Avastin.  To help prevent nausea and vomiting after your treatment, we encourage you to take your nausea medication as prescribed.   If you develop nausea and vomiting that is not controlled by your nausea medication, call the clinic.   BELOW ARE SYMPTOMS THAT SHOULD BE REPORTED IMMEDIATELY:  *FEVER GREATER THAN 100.5 F  *CHILLS WITH OR WITHOUT FEVER  NAUSEA AND VOMITING THAT IS NOT CONTROLLED WITH YOUR NAUSEA MEDICATION  *UNUSUAL SHORTNESS OF BREATH  *UNUSUAL BRUISING OR BLEEDING  TENDERNESS IN MOUTH AND THROAT WITH OR WITHOUT PRESENCE OF ULCERS  *URINARY PROBLEMS  *BOWEL PROBLEMS  UNUSUAL RASH Items with * indicate a potential emergency and should be followed up as soon as possible.  Feel free to call the clinic you have any questions or concerns. The clinic phone number is [phone].    

## 2013-11-09 ENCOUNTER — Telehealth: Payer: Self-pay | Admitting: *Deleted

## 2013-11-09 NOTE — Telephone Encounter (Signed)
Call from pt's daughter reporting she received a letter from [employer] stating they were discontinuing co-pay assistance due to non-use of the benefit. Called [facility], per Jasmine: Letter was sent after 30 days non- use of benefit in Nov. Pt had a recent dispense so assistance will not be discontinued. Called Ramona with this information. She voiced understanding.

## 2013-11-12 ENCOUNTER — Other Ambulatory Visit: Payer: Self-pay | Admitting: *Deleted

## 2013-11-12 ENCOUNTER — Encounter: Payer: Self-pay | Admitting: Oncology

## 2013-11-12 NOTE — Progress Notes (Signed)
Per [facility] patient approved for Xeloda 05/29/13-05/28/14

## 2013-11-12 NOTE — Telephone Encounter (Signed)
Refill request from [facility] to MD desk for review.

## 2013-11-13 ENCOUNTER — Other Ambulatory Visit: Payer: Self-pay | Admitting: *Deleted

## 2013-11-13 DIAGNOSIS — C189 Malignant neoplasm of colon, unspecified: Secondary | ICD-10-CM

## 2013-11-13 MED ORDER — CAPECITABINE 500 MG PO TABS: 1000.0000 mg | ORAL_TABLET | Freq: Two times a day (BID) | ORAL | Status: DC

## 2013-11-16 ENCOUNTER — Ambulatory Visit (HOSPITAL_COMMUNITY)
Admission: RE | Admit: 2013-11-16 | Discharge: 2013-11-16 | Disposition: A | Discharge status: Home / Self Care | Payer: Medicare Other | Source: Ambulatory Visit | Attending: Nurse Practitioner | Admitting: Nurse Practitioner

## 2013-11-16 DIAGNOSIS — C787 Secondary malignant neoplasm of liver and intrahepatic bile duct: Secondary | ICD-10-CM | POA: Insufficient documentation

## 2013-11-16 DIAGNOSIS — C189 Malignant neoplasm of colon, unspecified: Secondary | ICD-10-CM | POA: Insufficient documentation

## 2013-11-16 MED ORDER — GADOBENATE DIMEGLUMINE 529 MG/ML IV SOLN
10.0000 mL | Freq: Once | INTRAVENOUS | Status: AC | PRN
  Administered 2013-11-16: 10 mL via INTRAVENOUS

## 2013-11-18 ENCOUNTER — Other Ambulatory Visit: Payer: Self-pay | Admitting: Oncology

## 2013-11-23 ENCOUNTER — Telehealth: Payer: Self-pay | Admitting: Oncology

## 2013-11-23 ENCOUNTER — Other Ambulatory Visit (HOSPITAL_BASED_OUTPATIENT_CLINIC_OR_DEPARTMENT_OTHER): Payer: Medicare Other

## 2013-11-23 ENCOUNTER — Ambulatory Visit (HOSPITAL_BASED_OUTPATIENT_CLINIC_OR_DEPARTMENT_OTHER): Payer: Medicare Other

## 2013-11-23 ENCOUNTER — Telehealth: Payer: Self-pay | Admitting: *Deleted

## 2013-11-23 ENCOUNTER — Ambulatory Visit (HOSPITAL_BASED_OUTPATIENT_CLINIC_OR_DEPARTMENT_OTHER): Payer: Medicare Other | Admitting: Oncology

## 2013-11-23 ENCOUNTER — Encounter (INDEPENDENT_AMBULATORY_CARE_PROVIDER_SITE_OTHER): Payer: Self-pay

## 2013-11-23 VITALS — BP 140/73 | HR 72 | Temp 97.0°F | Resp 18 | Ht 61.0 in | Wt 199.2 lb

## 2013-11-23 DIAGNOSIS — C787 Secondary malignant neoplasm of liver and intrahepatic bile duct: Secondary | ICD-10-CM

## 2013-11-23 DIAGNOSIS — R609 Edema, unspecified: Secondary | ICD-10-CM

## 2013-11-23 DIAGNOSIS — C189 Malignant neoplasm of colon, unspecified: Secondary | ICD-10-CM

## 2013-11-23 DIAGNOSIS — R5381 Other malaise: Secondary | ICD-10-CM

## 2013-11-23 DIAGNOSIS — D6481 Anemia due to antineoplastic chemotherapy: Secondary | ICD-10-CM

## 2013-11-23 DIAGNOSIS — Z5112 Encounter for antineoplastic immunotherapy: Secondary | ICD-10-CM

## 2013-11-23 DIAGNOSIS — E119 Type 2 diabetes mellitus without complications: Secondary | ICD-10-CM

## 2013-11-23 DIAGNOSIS — F329 Major depressive disorder, single episode, unspecified: Secondary | ICD-10-CM

## 2013-11-23 LAB — CBC WITH DIFFERENTIAL/PLATELET
BASO%: 0.6 % (ref 0.0–2.0)
EOS%: 4.5 % (ref 0.0–7.0)
Eosinophils Absolute: 0.3 10*3/uL (ref 0.0–0.5)
HGB: 10.6 g/dL — ABNORMAL LOW (ref 11.6–15.9)
LYMPH%: 28.1 % (ref 14.0–49.7)
MCH: 35 pg — ABNORMAL HIGH (ref 25.1–34.0)
MCHC: 34.1 g/dL (ref 31.5–36.0)
MCV: 102.6 fL — ABNORMAL HIGH (ref 79.5–101.0)
MONO%: 7.2 % (ref 0.0–14.0)
NEUT#: 4 10*3/uL (ref 1.5–6.5)
Platelets: 195 10*3/uL (ref 145–400)
RBC: 3.03 10*6/uL — ABNORMAL LOW (ref 3.70–5.45)
RDW: 18.7 % — ABNORMAL HIGH (ref 11.2–14.5)
nRBC: 0 % (ref 0–0)

## 2013-11-23 LAB — COMPREHENSIVE METABOLIC PANEL (CC13)
ALT: 30 U/L (ref 0–55)
AST: 35 U/L — ABNORMAL HIGH (ref 5–34)
Albumin: 3.3 g/dL — ABNORMAL LOW (ref 3.5–5.0)
Alkaline Phosphatase: 109 U/L (ref 40–150)
Anion Gap: 12 mEq/L — ABNORMAL HIGH (ref 3–11)
Creatinine: 1.2 mg/dL — ABNORMAL HIGH (ref 0.6–1.1)
Glucose: 143 mg/dl — ABNORMAL HIGH (ref 70–140)
Potassium: 4.3 mEq/L (ref 3.5–5.1)
Sodium: 143 mEq/L (ref 136–145)
Total Bilirubin: 1.68 mg/dL — ABNORMAL HIGH (ref 0.20–1.20)
Total Protein: 6.5 g/dL (ref 6.4–8.3)

## 2013-11-23 LAB — UA PROTEIN, DIPSTICK - CHCC: Protein, ur: NEGATIVE mg/dL

## 2013-11-23 MED ORDER — SODIUM CHLORIDE 0.9 % IV SOLN
7.5000 mg/kg | Freq: Once | INTRAVENOUS | Status: AC
  Administered 2013-11-23: 725 mg via INTRAVENOUS
  Filled 2013-11-23: qty 29

## 2013-11-23 MED ORDER — HEPARIN SOD (PORK) LOCK FLUSH 100 UNIT/ML IV SOLN
500.0000 [IU] | Freq: Once | INTRAVENOUS | Status: AC | PRN
  Administered 2013-11-23: 500 [IU]
  Filled 2013-11-23: qty 5

## 2013-11-23 MED ORDER — SODIUM CHLORIDE 0.9 % IV SOLN
Freq: Once | INTRAVENOUS | Status: AC
  Administered 2013-11-23: 11:00:00 via INTRAVENOUS

## 2013-11-23 MED ORDER — SODIUM CHLORIDE 0.9 % IJ SOLN
10.0000 mL | INTRAMUSCULAR | Status: DC | PRN
  Administered 2013-11-23: 10 mL
  Filled 2013-11-23: qty 10

## 2013-11-23 NOTE — Patient Instructions (Signed)
[facility] Discharge Instructions for Patients Receiving Chemotherapy  Today you received the following chemotherapy agents Avastin.  To help prevent nausea and vomiting after your treatment, we encourage you to take your nausea medication as prescribed.   If you develop nausea and vomiting that is not controlled by your nausea medication, call the clinic.   BELOW ARE SYMPTOMS THAT SHOULD BE REPORTED IMMEDIATELY:  *FEVER GREATER THAN 100.5 F  *CHILLS WITH OR WITHOUT FEVER  NAUSEA AND VOMITING THAT IS NOT CONTROLLED WITH YOUR NAUSEA MEDICATION  *UNUSUAL SHORTNESS OF BREATH  *UNUSUAL BRUISING OR BLEEDING  TENDERNESS IN MOUTH AND THROAT WITH OR WITHOUT PRESENCE OF ULCERS  *URINARY PROBLEMS  *BOWEL PROBLEMS  UNUSUAL RASH Items with * indicate a potential emergency and should be followed up as soon as possible.  Feel free to call the clinic you have any questions or concerns. The clinic phone number is [phone].    

## 2013-11-23 NOTE — Progress Notes (Signed)
[facility]    OFFICE PROGRESS NOTE   INTERVAL HISTORY:   She returns for scheduled followup of metastatic colon cancer. She completed the most recent cycle of Xeloda/Avastin beginning on 11/02/2013. One episode of diarrhea. Soreness over the tongue without a discrete ulcer. Mild discomfort at the right hand. She feels tired after being active this week.  Objective:  Vital signs in last 24 hours:  There were no vitals taken for this visit.    HEENT: No thrush or ulcers, mild erythema at the tip of the tongue Resp: Lungs clear bilaterally Cardio: Regular rate and rhythm GI: No hepatosplenomegaly, nontender, no mass Vascular: 1+ pitting edema at the low leg and foot bilaterally, left greater than right  Skin: Hyperpigmentation over the hands and feet. Dry desquamation at the feet. Mild erythema with superficial desquamation at the palm of the right hand   Portacath/PICC-without erythema  Lab Results:  Lab Results  Component Value Date   WBC 6.6 10/30/2013   HGB 11.2* 10/30/2013   HCT 33.0* 10/30/2013   MCV 107.7* 10/30/2013   PLT 196 10/30/2013   ANC 4.0 The urine protein-negative  X-rays: MRI of the liver on 11/16/2013, compared to 04/09/2013: 3 enhancing lesions in the right liver are slightly increased in size compared to the previous study. There are radiation changes. The segment 7 lesion appears outside of the radiation field. There is an ill-defined area of enhancement in segment 8 which is new, but no associated measurable lesion. No suspicious lesion in the left hepatic lobe. No evidence of retroperitoneal/periportal lymphadenopathy.   Medications: I have reviewed the patient's current medications.  Assessment/Plan: 1. Metastatic colon cancer presenting with synchronous colon primaries in September, 2008, status post a hemicolectomy and liver biopsy 08/03/2007. A CT on 04/20/2010 revealed no evidence metastatic disease. She was maintained on  5-FU/leucovorin per the FOLFOX regimen beginning in April, 2009. Avasta was discontinued from the chemotherapy regimen beginning in November, 2009 due to nephrotic range proteinuria. A CT of the abdomen and pelvis 02/05/2011 revealed evidence of three small liver lesions. An MRI of the abdomen on 03/02/2011 confirmed 3 liver lesions and no additional evidence of metastatic disease. K-RAS testing on the transverse colon and left colon tumors revealed a wild type genotype.  1A. Staging PET scan 03/21/2011 confirmed 3 hypermetabolic liver lesions and no additional evidence of metastatic disease.  1B. Surgical consultation by Dr. Donell Beers was obtained and Ms. Marcin decided against an attempt at surgical resection/radiofrequency ablation.  1C. Salvage therapy with FOLFOX was initiated 04/13/2011.  1D. A restaging CT 06/28/2011 revealed a significant improvement in the hepatic metastasis.  1E. She completed eight cycles of FOLFOX chemotherapy. Treatment was switched to 5FU-leucovorin as per the FOLFOX regimen beginning with cycle #9 due to an allergic reaction (skin rash) following cycle #8.  39F. Restaging CT 10/25/2011 revealed 2 more conspicuous liver metastases .  1G. Restaging PET scan 12/24/2011 revealed an increase in the size and metabolic activity of 3 liver metastases  1H. She began FOLFIRI chemotherapy on 01/19/2012.  1 I. Restaging CT of the abdomen 05/08/2012 revealed a mixed response with enlargement of 2 liver lesions and a decrease in the size of a third lesion, no new lesions.  1J. FOLFIRI chemotherapy continued with the addition of Avastin beginning 05/09/2012.  1K. restaging CT 07/28/2012 with no new liver lesions and a decreased size of the previously noted 3 liver lesions.  1L. She was last treated with FOLFIRI/Avastin on 09/12/2012.  88M.  She completed stereotactic radiotherapy to 3 liver metastases 10/10/2012 through 10/20/2012.  1N. Follow-up MRI of the abdomen 11/24/2012 showed  improvement in the 3 hepatic metastases and no progressive disease.  1O. Restaging MRI 04/09/2013 showed improvement in the 3 treated hepatic lesions and 2 new lesions. No evidence of metastatic disease outside of liver.  1P. Initiation of Xeloda/Avastin 05/30/2013. Cycle 2 initiated on 06/20/2013 (Avastin placed on hold)-Xeloda discontinued after 10 days secondary to hand/foot syndrome. Cycle 3 Xeloda question initiated on 07/28/2013 (she is unable to recall when she started or stopped cycle 3). Cycle 4 08/20/2013. Cycle 5 09/14/2013 (no Xeloda). Cycle 6 Xeloda/Avastin 10/01/2013. Cycle 7 Xeloda/Avastin 11/02/2013 1Q. restaging MRI of the liver 11/16/2013-3 enhancing right liver lesion, slightly larger. No evidence of metastasis in the left hepatic lobe 1. Diabetes. 2. History of proteinuria secondary to Avastin versus diabetes. 3. Mucositis and hand foot syndrome, secondary to 5FU improved with a dose reduction of the 5-FU. 4. Anemia secondary to chemotherapy.  5. Left back and left leg pain with an MRI of the lumbar spine confirming lumbar disk disease. She is status post surgery by Dr. Franky Macho 02/14/2008 with resolution of the back and left leg pain. 6. Rosacea. She is followed by dermatology. A recent flare, now maintained on doxycycline 7. Depression, maintained on Cymbalta-she requested a referral to Dr. Noe Gens 8. History of bilateral knee pain secondary to arthritis -- she takes Vicodin as needed. 9. History of anorexia -- weight loss -- ?Related to depression/anxiety or metastatic colon cancer. 10. Allergic reaction to oxaliplatin with FOLFOX on 04/13/2011. She was able to tolerate FOLFOX on 04/20/2011 with steroid premedication and a prolonged oxaliplatin infusion. After cycle #8 she developed a rash upon returning home with pruritus. She took Benadryl with resolution of symptoms. This reaction occurred despite steroid premedication. 11. Poor dentition -- she was evaluated by Dr. Kristin Bruins.  She underwent tooth extractions 07/04/2013. She reports being scheduled for dentures 12. Neutropenia following cycle 1 of FOLFIRI. The FOLFIRI was dose reduced beginning with cycle 2. She was neutropenic 03/14/2012 and chemotherapy was held for one week.  13. Nausea and diarrhea during the irinotecan infusion on 03/21/2012. Symptoms resolved with atropine. She receives atropine as a premedication with each cycle. 14. Diarrhea following the 05/09/2012 chemotherapy cycle. Likely related to irinotecan. She takes Imodium as needed. 15. Diffuse pain 4-5 days after chemotherapy 07/17/2012-likely toxicity from Neulasta. 16. History of a fall with fracture of the right fifth metacarpal. 17. Hand/foot syndrome secondary to Xeloda-the Xeloda was dose reduced beginning with cycle 3 on 07/23/2013 (?). 18. Foot/low leg edema-? Etiology. She has not been taking Lasix consistently 19. Fatigue.   Disposition:  Emily Lawson appears stable. The restaging MRI reveals a slight increase in the size of 3 liver lesions compared to the MRI from May of 2014. She did not resume treatment with systemic therapy until July of 2014.  I discussed the MRI findings with Ms. Shorts and her daughter. We decided to continue Xeloda/Avastin beginning with another cycle today. She will return for an office visit and chemotherapy in 3 weeks. We will check a CEA today.  I will present her case again at the GI tumor conference 12/05/2013. Thornton Papas, MD  11/23/2013  9:07 AM

## 2013-11-23 NOTE — Telephone Encounter (Signed)
Per staff message and POF I have scheduled appts.  JMW  

## 2013-11-23 NOTE — Telephone Encounter (Signed)
Gave pt appt for lab,md and chemo for january 2015 °

## 2013-11-23 NOTE — Telephone Encounter (Signed)
Called [facility] to follow up on Xeloda Rx. They have Rx on file. Pharmacy has been unable to contact pt to arrange delivery. Called pt, instructed her to contact [facility] to set up delivery. She stated she would.

## 2013-11-26 ENCOUNTER — Telehealth: Payer: Self-pay | Admitting: *Deleted

## 2013-11-26 NOTE — Telephone Encounter (Signed)
Received fax from [facility]- Capectabine shipped 11/23/13

## 2013-12-05 ENCOUNTER — Telehealth: Payer: Self-pay | Admitting: *Deleted

## 2013-12-05 NOTE — Telephone Encounter (Signed)
Call from pt reporting swelling and pain in feet and hands. No relief after taking 2 doses of Lasix on 1/6. Reports it is difficult to walk. Fingertips are painful. Noted palmar erythema. No peeling on hands or feet. Pt asks if she should discontinue Xeloda? Due to complete cycle on 1/10.  Reviewed with Dr. Benay Spice: STOP XELODA. Pt voiced understanding. Next appt confirmed.

## 2013-12-09 ENCOUNTER — Other Ambulatory Visit: Payer: Self-pay | Admitting: Oncology

## 2013-12-10 ENCOUNTER — Other Ambulatory Visit: Payer: Self-pay | Admitting: *Deleted

## 2013-12-10 NOTE — Telephone Encounter (Signed)
THIS REFILL REQUEST FOR CAPECITABINE WAS GIVEN TO DR.SHERRILL'S NURSE, SUSAN COWARD,RN. 

## 2013-12-14 ENCOUNTER — Ambulatory Visit (HOSPITAL_BASED_OUTPATIENT_CLINIC_OR_DEPARTMENT_OTHER): Payer: Commercial Managed Care - HMO | Admitting: Nurse Practitioner

## 2013-12-14 ENCOUNTER — Telehealth: Payer: Self-pay | Admitting: Oncology

## 2013-12-14 ENCOUNTER — Ambulatory Visit (HOSPITAL_BASED_OUTPATIENT_CLINIC_OR_DEPARTMENT_OTHER): Payer: Commercial Managed Care - HMO

## 2013-12-14 ENCOUNTER — Encounter (INDEPENDENT_AMBULATORY_CARE_PROVIDER_SITE_OTHER): Payer: Self-pay

## 2013-12-14 ENCOUNTER — Other Ambulatory Visit: Payer: Self-pay | Admitting: *Deleted

## 2013-12-14 ENCOUNTER — Other Ambulatory Visit (HOSPITAL_BASED_OUTPATIENT_CLINIC_OR_DEPARTMENT_OTHER): Payer: Commercial Managed Care - HMO

## 2013-12-14 ENCOUNTER — Telehealth: Payer: Self-pay | Admitting: *Deleted

## 2013-12-14 VITALS — BP 109/55 | HR 59 | Temp 98.5°F | Resp 18 | Ht 61.0 in | Wt 199.0 lb

## 2013-12-14 DIAGNOSIS — C787 Secondary malignant neoplasm of liver and intrahepatic bile duct: Secondary | ICD-10-CM

## 2013-12-14 DIAGNOSIS — T451X5A Adverse effect of antineoplastic and immunosuppressive drugs, initial encounter: Secondary | ICD-10-CM

## 2013-12-14 DIAGNOSIS — C189 Malignant neoplasm of colon, unspecified: Secondary | ICD-10-CM

## 2013-12-14 DIAGNOSIS — R609 Edema, unspecified: Secondary | ICD-10-CM

## 2013-12-14 DIAGNOSIS — Z5112 Encounter for antineoplastic immunotherapy: Secondary | ICD-10-CM

## 2013-12-14 DIAGNOSIS — D6481 Anemia due to antineoplastic chemotherapy: Secondary | ICD-10-CM

## 2013-12-14 LAB — CBC WITH DIFFERENTIAL/PLATELET
BASO%: 0.5 % (ref 0.0–2.0)
BASOS ABS: 0 10*3/uL (ref 0.0–0.1)
EOS%: 3.8 % (ref 0.0–7.0)
Eosinophils Absolute: 0.3 10*3/uL (ref 0.0–0.5)
HCT: 31.3 % — ABNORMAL LOW (ref 34.8–46.6)
HEMOGLOBIN: 10.6 g/dL — AB (ref 11.6–15.9)
LYMPH#: 1.6 10*3/uL (ref 0.9–3.3)
LYMPH%: 23.7 % (ref 14.0–49.7)
MCH: 35 pg — ABNORMAL HIGH (ref 25.1–34.0)
MCHC: 33.9 g/dL (ref 31.5–36.0)
MCV: 103.3 fL — ABNORMAL HIGH (ref 79.5–101.0)
MONO#: 0.5 10*3/uL (ref 0.1–0.9)
MONO%: 7.9 % (ref 0.0–14.0)
NEUT#: 4.3 10*3/uL (ref 1.5–6.5)
NEUT%: 64.1 % (ref 38.4–76.8)
Platelets: 172 10*3/uL (ref 145–400)
RBC: 3.03 10*6/uL — AB (ref 3.70–5.45)
RDW: 20 % — AB (ref 11.2–14.5)
WBC: 6.6 10*3/uL (ref 3.9–10.3)

## 2013-12-14 LAB — UA PROTEIN, DIPSTICK - CHCC: Protein, ur: <30 mg/dL

## 2013-12-14 MED ORDER — SODIUM CHLORIDE 0.9 % IV SOLN
Freq: Once | INTRAVENOUS | Status: AC
Start: 2013-12-14 — End: 2013-12-14
  Administered 2013-12-14: 12:00:00 via INTRAVENOUS

## 2013-12-14 MED ORDER — HEPARIN SOD (PORK) LOCK FLUSH 100 UNIT/ML IV SOLN
500.0000 [IU] | Freq: Once | INTRAVENOUS | Status: AC | PRN
  Administered 2013-12-14: 500 [IU]
  Filled 2013-12-14: qty 5

## 2013-12-14 MED ORDER — CAPECITABINE 500 MG PO TABS: 1000.0000 mg | ORAL_TABLET | Freq: Two times a day (BID) | ORAL | Status: DC

## 2013-12-14 MED ORDER — BEVACIZUMAB CHEMO INJECTION 400 MG/16ML
7.5000 mg/kg | Freq: Once | INTRAVENOUS | Status: AC
  Administered 2013-12-14: 725 mg via INTRAVENOUS
  Filled 2013-12-14: qty 29

## 2013-12-14 MED ORDER — SODIUM CHLORIDE 0.9 % IJ SOLN
10.0000 mL | INTRAMUSCULAR | Status: DC | PRN
  Administered 2013-12-14: 10 mL
  Filled 2013-12-14: qty 10

## 2013-12-14 NOTE — Telephone Encounter (Signed)
Gave pt appt for lab and MD for February 2015 emailed Sharyn Lull regarding chemo

## 2013-12-14 NOTE — Progress Notes (Signed)
OFFICE PROGRESS NOTE  Interval history:  Emily Lawson returns for followup of metastatic colon cancer. She reports developing redness over the hands and painful swollen feet during the most recent cycle of Xeloda. She has significant pain with walking. She discontinued Xeloda beginning 12/05/2013. Hands and feet are better. She denies mouth sores. No nausea or vomiting. No recent diarrhea. She denies bleeding.   Objective: Filed Vitals:   12/14/13 1059  BP: 109/55  Pulse: 59  Temp:   Resp:    Oropharynx is without thrush or ulceration. Lungs are clear. Regular cardiac rhythm. Port-A-Cath site without erythema. Abdomen is soft and nontender. No hepatomegaly. Pitting edema at the lower legs bilaterally. Soles with erythema and dry desquamation. Palms with hyperpigmentation, skin thickening and dryness which is most notable between the base of the thumb and pointer finger.   Lab Results: Lab Results  Component Value Date   WBC 6.6 12/14/2013   HGB 10.6* 12/14/2013   HCT 31.3* 12/14/2013   MCV 103.3* 12/14/2013   PLT 172 12/14/2013   NEUTROABS 4.3 12/14/2013    Chemistry:    Chemistry      Component Value Date/Time   NA 143 11/23/2013 0902   NA 136 07/03/2013 0935   K 4.3 11/23/2013 0902   K 3.8 07/03/2013 0935   CL 99 07/03/2013 0935   CL 106 05/16/2013 1030   CO2 22 11/23/2013 0902   CO2 24 07/03/2013 0935   BUN 29.5* 11/23/2013 0902   BUN 26* 07/03/2013 0935   CREATININE 1.2* 11/23/2013 0902   CREATININE 1.19* 07/03/2013 0935   CREATININE 1.29* 05/20/2009 1423   GLU 102* 10/19/2007 1433      Component Value Date/Time   CALCIUM 10.3 11/23/2013 0902   CALCIUM 10.0 07/03/2013 0935   ALKPHOS 109 11/23/2013 0902   ALKPHOS 85 07/17/2012 1058   AST 35* 11/23/2013 0902   AST 17 07/17/2012 1058   ALT 30 11/23/2013 0902   ALT 13 07/17/2012 1058   BILITOT 1.68* 11/23/2013 0902   BILITOT 0.4 07/17/2012 1058       Studies/Results: Mr Liver W Wo Contrast  11/16/2013   CLINICAL DATA:  Colorectal  carcinoma with liver metastasis. Patient undergoing radiation therapy to the liver.  EXAM: MRI ABDOMEN WITHOUT AND WITH CONTRAST  TECHNIQUE: Multiplanar, multisequence MR imaging was performed both before and after administration of intravenous contrast.  CONTRAST:  45m MULTIHANCE GADOBENATE DIMEGLUMINE 529 MG/ML IV SOLN  COMPARISON:  MR ABDOMEN WO/W CM dated 04/09/2013; MR ABDOMEN WO/W CM dated 11/24/2012; NM PET IMAGE RESTAG (PS) SKULL BASE TO THIGH dated 12/24/2011  FINDINGS: There are 3 enhancing lesions in the posterior right aspect of the liver. The most inferior lesion measures 14 mm (image 14, series 1202) compared to 11 mm on prior. Lateral subcapsular lesion measures 24 mm (image 37) compared to 22 mm on prior. More central right hepatic lobe (segment 7) lesion measures 18 mm (image 32) compared 15 mm on prior. Within the more superior central aspect the liver (segment 8) there is ill-defined enhancement (image 29) which is new from prior but there is no measurable lesion here. There is no suspicious lesion in the left hepatic lobe.  On the T2 weighted imaging (series 3) there are geographic lesions of abnormal increased signal intensity consists with radiation change. The segment 7 lesion (image 16, series 3) appears outside of this treatment field.  No biliary duct dilatation. The gallbladder is absent. The pancreas, spleen, adrenal glands, kidneys are normal. No evidence  of retroperitoneal periportal lymphadenopathy. The stomach and limited view of the small bowel and colon are unremarkable. No aggressive osseous lesion. Lung bases are clear.  IMPRESSION: 1. Three enhancing lesions of the right hepatic lobe measure mildly larger than prior. 2. Increased enhancement within the superior anterior right hepatic lobe (segment 8) without measurable lesion. 3. No evidence metastasis n the left hepatic lobe. 4. A restaging FDG PET scan may help delineate the persistent active disease and identify any occult  lesions.   Electronically Signed   By: Suzy Bouchard M.D.   On: 11/16/2013 13:07    Medications: I have reviewed the patient's current medications.  Assessment/Plan: 1. Metastatic colon cancer presenting with synchronous colon primaries in September, 2008, status post a hemicolectomy and liver biopsy 08/03/2007. A CT on 04/20/2010 revealed no evidence metastatic disease. She was maintained on 5-FU/leucovorin per the FOLFOX regimen beginning in April, 2009. Avasta was discontinued from the chemotherapy regimen beginning in November, 2009 due to nephrotic range proteinuria. A CT of the abdomen and pelvis 02/05/2011 revealed evidence of three small liver lesions. An MRI of the abdomen on 03/02/2011 confirmed 3 liver lesions and no additional evidence of metastatic disease. K-RAS testing on the transverse colon and left colon tumors revealed a wild type genotype.  1A. Staging PET scan 03/21/2011 confirmed 3 hypermetabolic liver lesions and no additional evidence of metastatic disease.  1B. Surgical consultation by Dr. Barry Dienes was obtained and Emily Lawson.  1C. Salvage therapy with FOLFOX was initiated 04/13/2011.  1D. A restaging CT 06/28/2011 revealed a significant improvement in the hepatic metastasis.  1E. She completed eight cycles of FOLFOX chemotherapy. Treatment was switched to 5FU-leucovorin as per the FOLFOX regimen beginning with cycle #9 due to an allergic reaction (skin rash) following cycle #8.  16F. Restaging CT 10/25/2011 revealed 2 more conspicuous liver metastases .  1G. Restaging PET scan 12/24/2011 revealed an increase in the size and metabolic activity of 3 liver metastases  1H. She began FOLFIRI chemotherapy on 01/19/2012.  1 I. Restaging CT of the abdomen 05/08/2012 revealed a mixed response with enlargement of 2 liver lesions and a decrease in the size of a third lesion, no new lesions.  1J. FOLFIRI  chemotherapy continued with the addition of Avastin beginning 05/09/2012.  1K. restaging CT 07/28/2012 with no new liver lesions and a decreased size of the previously noted 3 liver lesions.  1L. She was last treated with FOLFIRI/Avastin on 09/12/2012.  48M. She completed stereotactic radiotherapy to 3 liver metastases 10/10/2012 through 10/20/2012.  1N. Follow-up MRI of the abdomen 11/24/2012 showed improvement in the 3 hepatic metastases and no progressive disease.  1O. Restaging MRI 04/09/2013 showed improvement in the 3 treated hepatic lesions and 2 new lesions. No evidence of metastatic disease outside of liver.  1P. Initiation of Xeloda/Avastin 05/30/2013. Cycle 2 initiated on 06/20/2013 (Avastin placed on hold)-Xeloda discontinued after 10 days secondary to hand/foot syndrome. Cycle 3 Xeloda question initiated on 07/28/2013 (she is unable to recall when she started or stopped cycle 3). Cycle 4 08/20/2013. Cycle 5 09/14/2013 (no Xeloda). Cycle 6 Xeloda/Avastin 10/01/2013. Cycle 7 Xeloda/Avastin 11/02/2013  1Q. restaging MRI of the liver 11/16/2013-3 enhancing right liver lesion, slightly larger. No evidence of metastasis in the left hepatic lobe. 1R. Xeloda/Avastin continued.  1. Diabetes. 2. History of proteinuria secondary to Avastin versus diabetes. 3. Mucositis and hand foot syndrome, secondary to 5FU improved with a dose reduction of the 5-FU. 4. Anemia  secondary to chemotherapy.  5. Left back and left leg pain with an MRI of the lumbar spine confirming lumbar disk disease. She is status post surgery by Dr. Christella Noa 02/14/2008 with resolution of the back and left leg pain. 6. Rosacea. She is followed by dermatology. A recent flare, now maintained on doxycycline 7. Depression, maintained on Cymbalta-she requested a referral to Dr. Ferdinand Lango 8. History of bilateral knee pain secondary to arthritis -- she takes Vicodin as needed. 9. History of anorexia -- weight loss -- ?Related to  depression/anxiety or metastatic colon cancer. 10. Allergic reaction to oxaliplatin with FOLFOX on 04/13/2011. She was able to tolerate FOLFOX on 04/20/2011 with steroid premedication and a prolonged oxaliplatin infusion. After cycle #8 she developed a rash upon returning home with pruritus. She took Benadryl with resolution of symptoms. This reaction occurred despite steroid premedication. 11. Poor dentition -- she was evaluated by Dr. Enrique Sack. She underwent tooth extractions 07/04/2013. She reports being scheduled for dentures 12. Neutropenia following cycle 1 of FOLFIRI. The FOLFIRI was dose reduced beginning with cycle 2. She was neutropenic 03/14/2012 and chemotherapy was held for one week.  13. Nausea and diarrhea during the irinotecan infusion on 03/21/2012. Symptoms resolved with atropine. She receives atropine as a premedication with each cycle. 14. Diarrhea following the 05/09/2012 chemotherapy cycle. Likely related to irinotecan. She takes Imodium as needed. 15. Diffuse pain 4-5 days after chemotherapy 07/17/2012-likely toxicity from Neulasta. 16. History of a fall with fracture of the right fifth metacarpal. 17. Hand/foot syndrome secondary to Xeloda-the Xeloda was dose reduced beginning with cycle 3 on 07/23/2013 (?).  18. Foot/low leg edema-? Etiology. She has not been taking Lasix consistently. 19. Fatigue.  Dispositon-she developed progressive symptoms of hand-foot syndrome during the most recent cycle of Xeloda. Dr. Benay Spice recommends resuming Xeloda at the current dose of 1000 mg twice daily on a 7 day on/7 day off schedule. She will continue every 3 week Avastin. She is agreeable with this plan. We will see her in followup in 3 weeks.  Patient seen with Dr. Benay Spice.  25 minutes were spent face-to-face at today's visit with the majority of that time involved in counseling/coordination of care.   Ned Card ANP/GNP-BC   This was a shared visit with Ned Card. I discussed  treatment options with Emily Lawson and her daughter.I will present her case at the GI tumor conference next week.We will discuss options for hepatic directed therapy.She will continue Xeloda/Avastin with followup of the CEA.  Julieanne Manson, M.D.

## 2013-12-14 NOTE — Patient Instructions (Signed)
[facility] Discharge Instructions for Patients Receiving Chemotherapy  Today you received the following chemotherapy agents Avastin.  To help prevent nausea and vomiting after your treatment, we encourage you to take your nausea medication as prescribed.   If you develop nausea and vomiting that is not controlled by your nausea medication, call the clinic.   BELOW ARE SYMPTOMS THAT SHOULD BE REPORTED IMMEDIATELY:  *FEVER GREATER THAN 100.5 F  *CHILLS WITH OR WITHOUT FEVER  NAUSEA AND VOMITING THAT IS NOT CONTROLLED WITH YOUR NAUSEA MEDICATION  *UNUSUAL SHORTNESS OF BREATH  *UNUSUAL BRUISING OR BLEEDING  TENDERNESS IN MOUTH AND THROAT WITH OR WITHOUT PRESENCE OF ULCERS  *URINARY PROBLEMS  *BOWEL PROBLEMS  UNUSUAL RASH Items with * indicate a potential emergency and should be followed up as soon as possible.  Feel free to call the clinic you have any questions or concerns. The clinic phone number is [phone].    

## 2013-12-14 NOTE — Telephone Encounter (Signed)
Per staff message and POF I have scheduled appts.  JMW  

## 2013-12-15 ENCOUNTER — Telehealth: Payer: Self-pay | Admitting: Oncology

## 2013-12-15 NOTE — Telephone Encounter (Signed)
Talked to pt and gave her appt for lab,mL and chemo for February 2015

## 2013-12-17 NOTE — Telephone Encounter (Signed)
RECEIVED A FAX FROM [facility] CONCERNING A NOTICE OF RX TRANSFER TO [employer] [facility]. 

## 2013-12-19 ENCOUNTER — Other Ambulatory Visit: Payer: Self-pay | Admitting: *Deleted

## 2013-12-19 DIAGNOSIS — C189 Malignant neoplasm of colon, unspecified: Secondary | ICD-10-CM

## 2013-12-19 MED ORDER — CAPECITABINE 500 MG PO TABS: 1000.0000 mg | ORAL_TABLET | Freq: Two times a day (BID) | ORAL | Status: DC

## 2013-12-21 NOTE — Telephone Encounter (Signed)
RECEIVED A FAX FROM [facility] CONCERNING A PRIOR AUTHORIZATION FOR CAPECITABINE. THIS REQUEST WAS PLACED IN THE MANAGED CARE BIN.

## 2013-12-24 ENCOUNTER — Encounter: Payer: Self-pay | Admitting: Oncology

## 2013-12-24 NOTE — Progress Notes (Signed)
[employer] approved xeloda under medicare part B.

## 2013-12-28 ENCOUNTER — Encounter: Payer: Self-pay | Admitting: Oncology

## 2013-12-28 NOTE — Progress Notes (Signed)
Patient left a message for call back. I called and she has question about medicaid application, but her daughter has it. She will have her call me and if I can asst will give ph for [phone] with asst in filling out their app

## 2013-12-30 ENCOUNTER — Other Ambulatory Visit: Payer: Self-pay | Admitting: Oncology

## 2013-12-31 ENCOUNTER — Telehealth: Payer: Self-pay | Admitting: *Deleted

## 2013-12-31 NOTE — Telephone Encounter (Signed)
Call from pt requesting refill on Xeloda. States she ran out 3-4 days early. Pt unable to verbalize dosing instructions. Stated she thinks she takes one twice daily. Pt unable to tell RN when she started/ stopped Xeloda. Spoke with pt's daughter, she reports they did not receive the latest order of Xeloda from [facility]. [place] [phone], they will contact pt to arrange delivery.

## 2014-01-04 ENCOUNTER — Telehealth: Payer: Self-pay | Admitting: Oncology

## 2014-01-04 ENCOUNTER — Ambulatory Visit (HOSPITAL_BASED_OUTPATIENT_CLINIC_OR_DEPARTMENT_OTHER): Payer: Medicare PPO | Admitting: Nurse Practitioner

## 2014-01-04 ENCOUNTER — Ambulatory Visit (HOSPITAL_BASED_OUTPATIENT_CLINIC_OR_DEPARTMENT_OTHER): Payer: Commercial Managed Care - HMO

## 2014-01-04 ENCOUNTER — Other Ambulatory Visit (HOSPITAL_BASED_OUTPATIENT_CLINIC_OR_DEPARTMENT_OTHER): Payer: Commercial Managed Care - HMO

## 2014-01-04 VITALS — BP 136/73 | HR 59 | Temp 97.9°F | Resp 18 | Ht 61.0 in | Wt 196.1 lb

## 2014-01-04 DIAGNOSIS — C189 Malignant neoplasm of colon, unspecified: Secondary | ICD-10-CM

## 2014-01-04 DIAGNOSIS — L270 Generalized skin eruption due to drugs and medicaments taken internally: Secondary | ICD-10-CM

## 2014-01-04 DIAGNOSIS — C787 Secondary malignant neoplasm of liver and intrahepatic bile duct: Secondary | ICD-10-CM

## 2014-01-04 DIAGNOSIS — R5381 Other malaise: Secondary | ICD-10-CM

## 2014-01-04 DIAGNOSIS — T451X5A Adverse effect of antineoplastic and immunosuppressive drugs, initial encounter: Secondary | ICD-10-CM

## 2014-01-04 DIAGNOSIS — Z5112 Encounter for antineoplastic immunotherapy: Secondary | ICD-10-CM

## 2014-01-04 DIAGNOSIS — E119 Type 2 diabetes mellitus without complications: Secondary | ICD-10-CM

## 2014-01-04 DIAGNOSIS — D6481 Anemia due to antineoplastic chemotherapy: Secondary | ICD-10-CM

## 2014-01-04 DIAGNOSIS — R609 Edema, unspecified: Secondary | ICD-10-CM

## 2014-01-04 DIAGNOSIS — R5383 Other fatigue: Secondary | ICD-10-CM

## 2014-01-04 LAB — CBC WITH DIFFERENTIAL/PLATELET
BASO%: 0.6 % (ref 0.0–2.0)
Basophils Absolute: 0.1 10*3/uL (ref 0.0–0.1)
EOS%: 3.9 % (ref 0.0–7.0)
Eosinophils Absolute: 0.3 10*3/uL (ref 0.0–0.5)
HCT: 33.1 % — ABNORMAL LOW (ref 34.8–46.6)
HGB: 11.3 g/dL — ABNORMAL LOW (ref 11.6–15.9)
LYMPH%: 24 % (ref 14.0–49.7)
MCH: 35.8 pg — ABNORMAL HIGH (ref 25.1–34.0)
MCHC: 34.1 g/dL (ref 31.5–36.0)
MCV: 104.7 fL — ABNORMAL HIGH (ref 79.5–101.0)
MONO#: 0.6 10*3/uL (ref 0.1–0.9)
MONO%: 7.1 % (ref 0.0–14.0)
NEUT#: 5.5 10*3/uL (ref 1.5–6.5)
NEUT%: 64.4 % (ref 38.4–76.8)
NRBC: 0 % (ref 0–0)
Platelets: 230 10*3/uL (ref 145–400)
RBC: 3.16 10*6/uL — AB (ref 3.70–5.45)
RDW: 19.8 % — AB (ref 11.2–14.5)
WBC: 8.5 10*3/uL (ref 3.9–10.3)
lymph#: 2 10*3/uL (ref 0.9–3.3)

## 2014-01-04 LAB — COMPREHENSIVE METABOLIC PANEL (CC13)
ALT: 25 U/L (ref 0–55)
AST: 31 U/L (ref 5–34)
Albumin: 3.1 g/dL — ABNORMAL LOW (ref 3.5–5.0)
Alkaline Phosphatase: 202 U/L — ABNORMAL HIGH (ref 40–150)
Anion Gap: 14 mEq/L — ABNORMAL HIGH (ref 3–11)
BUN: 31 mg/dL — ABNORMAL HIGH (ref 7.0–26.0)
CALCIUM: 9.3 mg/dL (ref 8.4–10.4)
CHLORIDE: 103 meq/L (ref 98–109)
CO2: 22 mEq/L (ref 22–29)
Creatinine: 1.3 mg/dL — ABNORMAL HIGH (ref 0.6–1.1)
Glucose: 153 mg/dl — ABNORMAL HIGH (ref 70–140)
Potassium: 3.8 mEq/L (ref 3.5–5.1)
Sodium: 140 mEq/L (ref 136–145)
Total Bilirubin: 2.16 mg/dL — ABNORMAL HIGH (ref 0.20–1.20)
Total Protein: 6.3 g/dL — ABNORMAL LOW (ref 6.4–8.3)

## 2014-01-04 LAB — UA PROTEIN, DIPSTICK - CHCC: Protein, ur: NEGATIVE mg/dL

## 2014-01-04 MED ORDER — SODIUM CHLORIDE 0.9 % IV SOLN
Freq: Once | INTRAVENOUS | Status: AC
  Administered 2014-01-04: 14:00:00 via INTRAVENOUS

## 2014-01-04 MED ORDER — SODIUM CHLORIDE 0.9 % IV SOLN
7.5000 mg/kg | Freq: Once | INTRAVENOUS | Status: AC
  Administered 2014-01-04: 725 mg via INTRAVENOUS
  Filled 2014-01-04: qty 29

## 2014-01-04 MED ORDER — SODIUM CHLORIDE 0.9 % IJ SOLN
10.0000 mL | INTRAMUSCULAR | Status: DC | PRN
  Administered 2014-01-04: 10 mL
  Filled 2014-01-04: qty 10

## 2014-01-04 MED ORDER — HEPARIN SOD (PORK) LOCK FLUSH 100 UNIT/ML IV SOLN
500.0000 [IU] | Freq: Once | INTRAVENOUS | Status: AC | PRN
  Administered 2014-01-04: 500 [IU]
  Filled 2014-01-04: qty 5

## 2014-01-04 NOTE — Progress Notes (Signed)
OFFICE PROGRESS NOTE  Interval history:  Emily Lawson returns for followup of metastatic colon cancer. She continues of Xeloda 1 week on/1 week off and every three-week Avastin. She notes a persistent area of dryness on the right hand. No significant hand or foot pain. She denies mouth sores. No nausea or vomiting. No diarrhea. She denies abdominal pain. No shortness of breath or chest pain. No bleeding except a small amount with nose blowing.   Objective: Filed Vitals:   01/04/14 1242  BP: 136/73  Pulse: 59  Temp: 97.9 F (36.6 C)  Resp: 18   Oropharynx is without thrush or ulceration. Lungs are clear. Regular cardiac rhythm. Port-A-Cath site without erythema. Abdomen soft and nontender. No hepatomegaly. Trace pitting edema at the lower legs bilaterally. Palms with hyperpigmentation and skin thickening. Persistent area of dryness right hand between the base of the thumb and first finger.   Lab Results: Lab Results  Component Value Date   WBC 8.5 01/04/2014   HGB 11.3* 01/04/2014   HCT 33.1* 01/04/2014   MCV 104.7* 01/04/2014   PLT 230 01/04/2014   NEUTROABS 5.5 01/04/2014    Chemistry:    Chemistry      Component Value Date/Time   NA 143 11/23/2013 0902   NA 136 07/03/2013 0935   K 4.3 11/23/2013 0902   K 3.8 07/03/2013 0935   CL 99 07/03/2013 0935   CL 106 05/16/2013 1030   CO2 22 11/23/2013 0902   CO2 24 07/03/2013 0935   BUN 29.5* 11/23/2013 0902   BUN 26* 07/03/2013 0935   CREATININE 1.2* 11/23/2013 0902   CREATININE 1.19* 07/03/2013 0935   CREATININE 1.29* 05/20/2009 1423   GLU 102* 10/19/2007 1433      Component Value Date/Time   CALCIUM 10.3 11/23/2013 0902   CALCIUM 10.0 07/03/2013 0935   ALKPHOS 109 11/23/2013 0902   ALKPHOS 85 07/17/2012 1058   AST 35* 11/23/2013 0902   AST 17 07/17/2012 1058   ALT 30 11/23/2013 0902   ALT 13 07/17/2012 1058   BILITOT 1.68* 11/23/2013 0902   BILITOT 0.4 07/17/2012 1058       Studies/Results: No results found.  Medications: I have reviewed  the patient's current medications.  Assessment/Plan: 1. Metastatic colon cancer presenting with synchronous colon primaries in September, 2008, status post a hemicolectomy and liver biopsy 08/03/2007. A CT on 04/20/2010 revealed no evidence metastatic disease. She was maintained on 5-FU/leucovorin per the FOLFOX regimen beginning in April, 2009. Avasta was discontinued from the chemotherapy regimen beginning in November, 2009 due to nephrotic range proteinuria. A CT of the abdomen and pelvis 02/05/2011 revealed evidence of three small liver lesions. An MRI of the abdomen on 03/02/2011 confirmed 3 liver lesions and no additional evidence of metastatic disease. K-RAS testing on the transverse colon and left colon tumors revealed a wild type genotype.  1A. Staging PET scan 03/21/2011 confirmed 3 hypermetabolic liver lesions and no additional evidence of metastatic disease.  1B. Surgical consultation by Dr. Barry Dienes was obtained and Emily Lawson decided against an attempt at surgical resection/radiofrequency ablation.  1C. Salvage therapy with FOLFOX was initiated 04/13/2011.  1D. A restaging CT 06/28/2011 revealed a significant improvement in the hepatic metastasis.  1E. She completed eight cycles of FOLFOX chemotherapy. Treatment was switched to 5FU-leucovorin as per the FOLFOX regimen beginning with cycle #9 due to an allergic reaction (skin rash) following cycle #8.  69F. Restaging CT 10/25/2011 revealed 2 more conspicuous liver metastases .  1G. Restaging PET scan  12/24/2011 revealed an increase in the size and metabolic activity of 3 liver metastases  1H. She began FOLFIRI chemotherapy on 01/19/2012.  1 I. Restaging CT of the abdomen 05/08/2012 revealed a mixed response with enlargement of 2 liver lesions and a decrease in the size of a third lesion, no new lesions.  1J. FOLFIRI chemotherapy continued with the addition of Avastin beginning 05/09/2012.  1K. restaging CT 07/28/2012 with no new liver lesions  and a decreased size of the previously noted 3 liver lesions.  1L. She was last treated with FOLFIRI/Avastin on 09/12/2012.  17M. She completed stereotactic radiotherapy to 3 liver metastases 10/10/2012 through 10/20/2012.  1N. Follow-up MRI of the abdomen 11/24/2012 showed improvement in the 3 hepatic metastases and no progressive disease.  1O. Restaging MRI 04/09/2013 showed improvement in the 3 treated hepatic lesions and 2 new lesions. No evidence of metastatic disease outside of liver.  1P. Initiation of Xeloda/Avastin 05/30/2013. Cycle 2 initiated on 06/20/2013 (Avastin placed on hold)-Xeloda discontinued after 10 days secondary to hand/foot syndrome. Cycle 3 Xeloda question initiated on 07/28/2013 (she is unable to recall when she started or stopped cycle 3). Cycle 4 08/20/2013. Cycle 5 09/14/2013 (no Xeloda). Cycle 6 Xeloda/Avastin 10/01/2013. Cycle 7 Xeloda/Avastin 11/02/2013  1Q. restaging MRI of the liver 11/16/2013-3 enhancing right liver lesion, slightly larger. No evidence of metastasis in the left hepatic lobe.  1R. Xeloda/Avastin continued.  1. Diabetes. 2. History of proteinuria secondary to Avastin versus diabetes. 3. Mucositis and hand foot syndrome, secondary to 5FU improved with a dose reduction of the 5-FU. 4. Anemia secondary to chemotherapy.  5. Left back and left leg pain with an MRI of the lumbar spine confirming lumbar disk disease. She is status post surgery by Dr. Christella Noa 02/14/2008 with resolution of the back and left leg pain. 6. Rosacea. She is followed by dermatology. A recent flare, now maintained on doxycycline 7. Depression, maintained on Cymbalta-she requested a referral to Dr. Ferdinand Lango 8. History of bilateral knee pain secondary to arthritis -- she takes Vicodin as needed. 9. History of anorexia -- weight loss -- ?Related to depression/anxiety or metastatic colon cancer. 10. Allergic reaction to oxaliplatin with FOLFOX on 04/13/2011. She was able to tolerate FOLFOX  on 04/20/2011 with steroid premedication and a prolonged oxaliplatin infusion. After cycle #8 she developed a rash upon returning home with pruritus. She took Benadryl with resolution of symptoms. This reaction occurred despite steroid premedication. 11. Poor dentition -- she was evaluated by Dr. Enrique Sack. She underwent tooth extractions 07/04/2013. She reports being scheduled for dentures 12. Neutropenia following cycle 1 of FOLFIRI. The FOLFIRI was dose reduced beginning with cycle 2. She was neutropenic 03/14/2012 and chemotherapy was held for one week.  13. Nausea and diarrhea during the irinotecan infusion on 03/21/2012. Symptoms resolved with atropine. She receives atropine as a premedication with each cycle. 14. Diarrhea following the 05/09/2012 chemotherapy cycle. Likely related to irinotecan. She takes Imodium as needed. 15. Diffuse pain 4-5 days after chemotherapy 07/17/2012-likely toxicity from Neulasta. 16. History of a fall with fracture of the right fifth metacarpal. 17. Hand/foot syndrome secondary to Xeloda-the Xeloda was dose reduced beginning with cycle 3 on 07/23/2013 (?). She is now taking Xeloda 1 week on/1 week off. 18. Foot/low leg edema-? Etiology. She has not been taking Lasix consistently. 19. Fatigue.  Dispositon-she appears stable. She will continue Xeloda at 1 week on/1 week off and every three-week Avastin. Dr. Benay Spice presented her case at the GI tumor conference. The consensus was  to proceed with a restaging PET scan. She is agreeable with this plan.  She will return for a followup visit and Avastin in 3 weeks. She will contact the office in the interim with any problems.  Plan reviewed with Dr. Benay Spice.   Ned Card ANP/GNP-BC

## 2014-01-04 NOTE — Telephone Encounter (Signed)
gave pt appt for lab and MD for June 2015 and CT for February 2015

## 2014-01-04 NOTE — Patient Instructions (Addendum)
[facility] Discharge Instructions for Patients Receiving Chemotherapy  Today you received the following chemotherapy agents: avastin   To help prevent nausea and vomiting after your treatment, we encourage you to take your nausea medication.  Take it as often as prescribed.     If you develop nausea and vomiting that is not controlled by your nausea medication, call the clinic. If it is after clinic hours your family physician or the after hours number for the clinic or go to the Emergency Department.   BELOW ARE SYMPTOMS THAT SHOULD BE REPORTED IMMEDIATELY:  *FEVER GREATER THAN 100.5 F  *CHILLS WITH OR WITHOUT FEVER  NAUSEA AND VOMITING THAT IS NOT CONTROLLED WITH YOUR NAUSEA MEDICATION  *UNUSUAL SHORTNESS OF BREATH  *UNUSUAL BRUISING OR BLEEDING  TENDERNESS IN MOUTH AND THROAT WITH OR WITHOUT PRESENCE OF ULCERS  *URINARY PROBLEMS  *BOWEL PROBLEMS  UNUSUAL RASH Items with * indicate a potential emergency and should be followed up as soon as possible.  Feel free to call the clinic you have any questions or concerns. The clinic phone number is [phone].   I have been informed and understand all the instructions given to me. I know to contact the clinic, my physician, or go to the Emergency Department if any problems should occur. I do not have any questions at this time, but understand that I may call the clinic during office hours   should I have any questions or need assistance in obtaining follow up care.    __________________________________________  _____________  __________ Signature of Patient or Authorized Representative            Date                   Time    __________________________________________ Nurse's Signature    

## 2014-01-04 NOTE — Progress Notes (Signed)
Urine protein negative 

## 2014-01-05 LAB — CEA: CEA: 8.6 ng/mL — ABNORMAL HIGH (ref 0.0–5.0)

## 2014-01-08 ENCOUNTER — Telehealth: Payer: Self-pay | Admitting: *Deleted

## 2014-01-08 NOTE — Telephone Encounter (Signed)
Per staff message and POF I have scheduled appts.  JMW  

## 2014-01-10 ENCOUNTER — Telehealth: Payer: Self-pay | Admitting: Oncology

## 2014-01-10 NOTE — Telephone Encounter (Signed)
Gave pt appt for lab and MD for February 2015 °

## 2014-01-18 ENCOUNTER — Ambulatory Visit (HOSPITAL_COMMUNITY): Payer: Medicare PPO

## 2014-01-20 ENCOUNTER — Other Ambulatory Visit: Payer: Self-pay | Admitting: Oncology

## 2014-01-22 ENCOUNTER — Other Ambulatory Visit: Payer: Self-pay | Admitting: *Deleted

## 2014-01-22 NOTE — Telephone Encounter (Signed)
THIS REFILL REQUEST FOR CAPECITABINE WAS GIVEN TO DR.SHERRILL'S NURSE, SUSAN COWARD,RN. 

## 2014-01-23 ENCOUNTER — Telehealth: Payer: Self-pay | Admitting: *Deleted

## 2014-01-23 ENCOUNTER — Other Ambulatory Visit: Payer: Self-pay | Admitting: *Deleted

## 2014-01-23 DIAGNOSIS — C189 Malignant neoplasm of colon, unspecified: Secondary | ICD-10-CM

## 2014-01-23 MED ORDER — CAPECITABINE 500 MG PO TABS: 1000.0000 mg | ORAL_TABLET | Freq: Two times a day (BID) | ORAL | Status: DC

## 2014-01-23 NOTE — Telephone Encounter (Signed)
Call from pt asking if she should reschedule office visit. PET is scheduled for 3/2. Office 2/27. Reviewed with Dr. Benay Spice: Keep appts as scheduled. Pt voiced understanding.

## 2014-01-25 ENCOUNTER — Other Ambulatory Visit (HOSPITAL_BASED_OUTPATIENT_CLINIC_OR_DEPARTMENT_OTHER): Payer: Commercial Managed Care - HMO

## 2014-01-25 ENCOUNTER — Telehealth: Payer: Self-pay | Admitting: *Deleted

## 2014-01-25 ENCOUNTER — Ambulatory Visit (HOSPITAL_BASED_OUTPATIENT_CLINIC_OR_DEPARTMENT_OTHER): Payer: Commercial Managed Care - HMO

## 2014-01-25 ENCOUNTER — Ambulatory Visit (HOSPITAL_BASED_OUTPATIENT_CLINIC_OR_DEPARTMENT_OTHER): Payer: Commercial Managed Care - HMO | Admitting: Oncology

## 2014-01-25 VITALS — BP 132/68 | HR 68 | Resp 18

## 2014-01-25 VITALS — BP 133/65 | HR 74 | Temp 97.5°F | Resp 19 | Ht 61.0 in | Wt 200.3 lb

## 2014-01-25 DIAGNOSIS — C189 Malignant neoplasm of colon, unspecified: Secondary | ICD-10-CM

## 2014-01-25 DIAGNOSIS — C787 Secondary malignant neoplasm of liver and intrahepatic bile duct: Secondary | ICD-10-CM

## 2014-01-25 DIAGNOSIS — R609 Edema, unspecified: Secondary | ICD-10-CM

## 2014-01-25 DIAGNOSIS — Z5112 Encounter for antineoplastic immunotherapy: Secondary | ICD-10-CM

## 2014-01-25 LAB — CBC WITH DIFFERENTIAL/PLATELET
BASO%: 0.4 % (ref 0.0–2.0)
Basophils Absolute: 0 10*3/uL (ref 0.0–0.1)
EOS%: 5 % (ref 0.0–7.0)
Eosinophils Absolute: 0.4 10*3/uL (ref 0.0–0.5)
HCT: 30.8 % — ABNORMAL LOW (ref 34.8–46.6)
HGB: 10.3 g/dL — ABNORMAL LOW (ref 11.6–15.9)
LYMPH#: 1.8 10*3/uL (ref 0.9–3.3)
LYMPH%: 24.9 % (ref 14.0–49.7)
MCH: 36 pg — AB (ref 25.1–34.0)
MCHC: 33.4 g/dL (ref 31.5–36.0)
MCV: 107.7 fL — ABNORMAL HIGH (ref 79.5–101.0)
MONO#: 0.6 10*3/uL (ref 0.1–0.9)
MONO%: 8.6 % (ref 0.0–14.0)
NEUT#: 4.4 10*3/uL (ref 1.5–6.5)
NEUT%: 61.1 % (ref 38.4–76.8)
NRBC: 0 % (ref 0–0)
Platelets: 214 10*3/uL (ref 145–400)
RBC: 2.86 10*6/uL — AB (ref 3.70–5.45)
RDW: 19.3 % — ABNORMAL HIGH (ref 11.2–14.5)
WBC: 7.2 10*3/uL (ref 3.9–10.3)

## 2014-01-25 LAB — COMPREHENSIVE METABOLIC PANEL (CC13)
ALBUMIN: 2.9 g/dL — AB (ref 3.5–5.0)
ALT: 13 U/L (ref 0–55)
AST: 25 U/L (ref 5–34)
Alkaline Phosphatase: 151 U/L — ABNORMAL HIGH (ref 40–150)
Anion Gap: 13 mEq/L — ABNORMAL HIGH (ref 3–11)
BUN: 26.5 mg/dL — ABNORMAL HIGH (ref 7.0–26.0)
CO2: 21 mEq/L — ABNORMAL LOW (ref 22–29)
Calcium: 8.7 mg/dL (ref 8.4–10.4)
Chloride: 105 mEq/L (ref 98–109)
Creatinine: 1.2 mg/dL — ABNORMAL HIGH (ref 0.6–1.1)
Glucose: 184 mg/dl — ABNORMAL HIGH (ref 70–140)
POTASSIUM: 3.6 meq/L (ref 3.5–5.1)
Sodium: 139 mEq/L (ref 136–145)
Total Bilirubin: 1.51 mg/dL — ABNORMAL HIGH (ref 0.20–1.20)
Total Protein: 6.2 g/dL — ABNORMAL LOW (ref 6.4–8.3)

## 2014-01-25 LAB — UA PROTEIN, DIPSTICK - CHCC: Protein, ur: NEGATIVE mg/dL

## 2014-01-25 MED ORDER — HEPARIN SOD (PORK) LOCK FLUSH 100 UNIT/ML IV SOLN
500.0000 [IU] | Freq: Once | INTRAVENOUS | Status: AC | PRN
  Administered 2014-01-25: 500 [IU]
  Filled 2014-01-25: qty 5

## 2014-01-25 MED ORDER — BEVACIZUMAB CHEMO INJECTION 400 MG/16ML
7.5000 mg/kg | Freq: Once | INTRAVENOUS | Status: AC
  Administered 2014-01-25: 725 mg via INTRAVENOUS
  Filled 2014-01-25: qty 29

## 2014-01-25 MED ORDER — SODIUM CHLORIDE 0.9 % IV SOLN
Freq: Once | INTRAVENOUS | Status: AC
  Administered 2014-01-25: 14:00:00 via INTRAVENOUS

## 2014-01-25 MED ORDER — SODIUM CHLORIDE 0.9 % IJ SOLN
10.0000 mL | INTRAMUSCULAR | Status: DC | PRN
  Administered 2014-01-25: 10 mL
  Filled 2014-01-25: qty 10

## 2014-01-25 NOTE — Patient Instructions (Signed)
[facility] Discharge Instructions for Patients Receiving Chemotherapy  Today you received the following chemotherapy agents:  Avastin  To help prevent nausea and vomiting after your treatment, we encourage you to take your nausea medication as ordered per MD.   If you develop nausea and vomiting that is not controlled by your nausea medication, call the clinic.   BELOW ARE SYMPTOMS THAT SHOULD BE REPORTED IMMEDIATELY:  *FEVER GREATER THAN 100.5 F  *CHILLS WITH OR WITHOUT FEVER  NAUSEA AND VOMITING THAT IS NOT CONTROLLED WITH YOUR NAUSEA MEDICATION  *UNUSUAL SHORTNESS OF BREATH  *UNUSUAL BRUISING OR BLEEDING  TENDERNESS IN MOUTH AND THROAT WITH OR WITHOUT PRESENCE OF ULCERS  *URINARY PROBLEMS  *BOWEL PROBLEMS  UNUSUAL RASH Items with * indicate a potential emergency and should be followed up as soon as possible.  Feel free to call the clinic you have any questions or concerns. The clinic phone number is [phone].    

## 2014-01-25 NOTE — Telephone Encounter (Signed)
appts made and printed. Pt is aware that tx will be added. i emailed MW to add the tx...td 

## 2014-01-25 NOTE — Progress Notes (Signed)
[place]    OFFICE PROGRESS NOTE   INTERVAL HISTORY:   She returns for scheduled followup of metastatic colon cancer. She completed another treatment with Avastin on 01/04/2014. She is taking Xeloda every other week. She complains of increased foot and lower leg swelling. She now has dentures.  Objective:  Vital signs in last 24 hours:  Blood pressure 133/65, pulse 74, temperature 97.5 F (36.4 C), temperature source Oral, resp. rate 19, height 5' 1"  (1.549 m), weight 200 lb 4.8 oz (90.855 kg), SpO2 100.00%.    HEENT: No thrush or ulcers Resp: Lungs clear bilaterally Cardio: Regular rate and rhythm, 2/6 systolic murmur GI: No hepatomegaly, nontender, no mass Vascular: 1+ pitting edema below the knee bilaterally  Skin: Hyperpigmentation with dry peeling at the palms and soles.   Portacath/PICC-without erythema  Lab Results:  Lab Results  Component Value Date   WBC 7.2 01/25/2014   HGB 10.3* 01/25/2014   HCT 30.8* 01/25/2014   MCV 107.7* 01/25/2014   PLT 214 01/25/2014   NEUTROABS 4.4 01/25/2014   ANC on 01/04/2014-8.6   Medications: I have reviewed the patient's current medications.  Assessment/Plan: 1. Metastatic colon cancer presenting with synchronous colon primaries in September, 2008, status post a hemicolectomy and liver biopsy 08/03/2007. A CT on 04/20/2010 revealed no evidence metastatic disease. She was maintained on 5-FU/leucovorin per the FOLFOX regimen beginning in April, 2009. Avasta was discontinued from the chemotherapy regimen beginning in November, 2009 due to nephrotic range proteinuria. A CT of the abdomen and pelvis 02/05/2011 revealed evidence of three small liver lesions. An MRI of the abdomen on 03/02/2011 confirmed 3 liver lesions and no additional evidence of metastatic disease. K-RAS testing on the transverse colon and left colon tumors revealed a wild type genotype.  1A. Staging PET scan 03/21/2011 confirmed 3 hypermetabolic liver  lesions and no additional evidence of metastatic disease.  1B. Surgical consultation by Dr. Barry Lawson was obtained and Emily Lawson decided against an attempt at surgical resection/radiofrequency ablation.  1C. Salvage therapy with FOLFOX was initiated 04/13/2011.  1D. A restaging CT 06/28/2011 revealed a significant improvement in the hepatic metastasis.  1E. She completed eight cycles of FOLFOX chemotherapy. Treatment was switched to 5FU-leucovorin as per the FOLFOX regimen beginning with cycle #9 due to an allergic reaction (skin rash) following cycle #8.  62F. Restaging CT 10/25/2011 revealed 2 more conspicuous liver metastases .  1G. Restaging PET scan 12/24/2011 revealed an increase in the size and metabolic activity of 3 liver metastases  1H. She began FOLFIRI chemotherapy on 01/19/2012.  1 I. Restaging CT of the abdomen 05/08/2012 revealed a mixed response with enlargement of 2 liver lesions and a decrease in the size of a third lesion, no new lesions.  1J. FOLFIRI chemotherapy continued with the addition of Avastin beginning 05/09/2012.  1K. restaging CT 07/28/2012 with no new liver lesions and a decreased size of the previously noted 3 liver lesions.  1L. She was last treated with FOLFIRI/Avastin on 09/12/2012.  76M. She completed stereotactic radiotherapy to 3 liver metastases 10/10/2012 through 10/20/2012.  1N. Follow-up MRI of the abdomen 11/24/2012 showed improvement in the 3 hepatic metastases and no progressive disease.  1O. Restaging MRI 04/09/2013 showed improvement in the 3 treated hepatic lesions and 2 new lesions. No evidence of metastatic disease outside of liver.  1P. Initiation of Xeloda/Avastin 05/30/2013. Cycle 2 initiated on 06/20/2013 (Avastin placed on hold)-Xeloda discontinued after 10 days secondary to hand/foot syndrome. Cycle 3 Xeloda question initiated  on 07/28/2013 (she is unable to recall when she started or stopped cycle 3). Cycle 4 08/20/2013. Cycle 5 09/14/2013 (no  Xeloda). Cycle 6 Xeloda/Avastin 10/01/2013. Cycle 7 Xeloda/Avastin 11/02/2013  1Q. restaging MRI of the liver 11/16/2013-3 enhancing right liver lesion, slightly larger. No evidence of metastasis in the left hepatic lobe.  1R. Xeloda/Avastin continued.  1. Diabetes. 2. History of proteinuria secondary to Avastin versus diabetes. 3. Mucositis and hand foot syndrome, secondary to 5FU improved with a dose reduction of the 5-FU. 4. Anemia secondary to chemotherapy.  5. Left back and left leg pain with an MRI of the lumbar spine confirming lumbar disk disease. She is status post surgery by Dr. Christella Lawson 02/14/2008 with resolution of the back and left leg pain. 6. Rosacea. She is followed by dermatology. A recent flare, now maintained on doxycycline 7. Depression, maintained on Cymbalta-she requested a referral to Dr. Ferdinand Lawson 8. History of bilateral knee pain secondary to arthritis -- she takes Vicodin as needed. 9. History of anorexia -- weight loss -- ?Related to depression/anxiety or metastatic colon cancer. 10. Allergic reaction to oxaliplatin with FOLFOX on 04/13/2011. She was able to tolerate FOLFOX on 04/20/2011 with steroid premedication and a prolonged oxaliplatin infusion. After cycle #8 she developed a rash upon returning home with pruritus. She took Benadryl with resolution of symptoms. This reaction occurred despite steroid premedication. 11. Poor dentition -- she was evaluated by Dr. Enrique Lawson. She underwent tooth extractions 07/04/2013. She now has dentures  12. Neutropenia following cycle 1 of FOLFIRI. The FOLFIRI was dose reduced beginning with cycle 2. She was neutropenic 03/14/2012 and chemotherapy was held for one week.  13. Nausea and diarrhea during the irinotecan infusion on 03/21/2012. Symptoms resolved with atropine. She receives atropine as a premedication with each cycle. 14. Diarrhea following the 05/09/2012 chemotherapy cycle. Likely related to irinotecan. She takes Imodium as  needed. 15. Diffuse pain 4-5 days after chemotherapy 07/17/2012-likely toxicity from Neulasta. 16. History of a fall with fracture of the right fifth metacarpal. 17. Hand/foot syndrome secondary to Xeloda-the Xeloda was dose reduced beginning with cycle 3 on 07/23/2013 (?). She is now taking Xeloda 1 week on/1 week off. 18. Foot/low leg edema-? Etiology. She has not been taking Lasix consistently. The edema could be related to amlodipine   Disposition:  Emily Lawson appears stable. She is tolerating the reduced dose Xeloda well. The plan is to continue Xeloda every other week with Avastin every 3 weeks. She is scheduled for a PET scan 01/28/2014. We will contact her with the PET scan result. She will return for an office visit on 02/15/2014. She may be a candidate for hepatic directed therapy based on the PET scan result.  I recommended she contact Dr. Alyson Ingles to discuss the leg edema.  Betsy Coder, MD  01/25/2014  1:20 PM

## 2014-01-25 NOTE — Telephone Encounter (Signed)
Per staff message and POF I have scheduled appts.  JMW  

## 2014-01-28 ENCOUNTER — Encounter (HOSPITAL_COMMUNITY): Payer: Self-pay

## 2014-01-28 ENCOUNTER — Encounter (HOSPITAL_COMMUNITY)
Admission: RE | Admit: 2014-01-28 | Discharge: 2014-01-28 | Disposition: A | Discharge status: Home / Self Care | Payer: Medicare PPO | Source: Ambulatory Visit | Attending: Nurse Practitioner | Admitting: Nurse Practitioner

## 2014-01-28 DIAGNOSIS — C787 Secondary malignant neoplasm of liver and intrahepatic bile duct: Secondary | ICD-10-CM | POA: Insufficient documentation

## 2014-01-28 DIAGNOSIS — C189 Malignant neoplasm of colon, unspecified: Secondary | ICD-10-CM | POA: Insufficient documentation

## 2014-01-28 LAB — GLUCOSE, CAPILLARY: Glucose-Capillary: 196 mg/dL — ABNORMAL HIGH (ref 70–99)

## 2014-01-28 MED ORDER — FLUDEOXYGLUCOSE F - 18 (FDG) INJECTION
11.4000 | Freq: Once | INTRAVENOUS | Status: AC | PRN
  Administered 2014-01-28: 11.4 via INTRAVENOUS

## 2014-01-31 ENCOUNTER — Telehealth: Payer: Self-pay | Admitting: *Deleted

## 2014-01-31 NOTE — Telephone Encounter (Signed)
Message copied by Tania Ade on Thu Jan 31, 2014  9:56 AM ------      Message from: Betsy Coder B      Created: Wed Jan 30, 2014  8:33 PM       Please call patient, MRI shows 2 hypermetabolic liver metastases, no other met. Disease. Cont. Current treatment, f/u as scheduled      Ask Barnett Applebaum to schedule for conference, radiology only 3/18 ------

## 2014-01-31 NOTE — Telephone Encounter (Signed)
Notified that PET showed only #2 areas of liver metastases and no other disease. Continue current tx and follow up on 02/15/14 as scheduled. Made her aware that case will be reviewed in GI Cancer Conference 02/13/14 and will discuss further with her at the 3/20 visit.

## 2014-02-06 ENCOUNTER — Telehealth: Payer: Self-pay | Admitting: *Deleted

## 2014-02-06 NOTE — Telephone Encounter (Signed)
VM from patient asking to talk with Dr. Benay Spice. "I'm having a bad time of it". Has appointment with her PCP at 3pm today, but wants to talk with Dr. Benay Spice.

## 2014-02-06 NOTE — Telephone Encounter (Signed)
Spoke with Bonnita Nasuti: says her feet are more swollen. Had nose bleed today and throat is sore-aid that sees her husband said she had a "knot" in her throat. Sees Dr. Alyson Ingles at 3pm today. Instructed her to keep that appointment so she can be examined (Dr. Benay Spice and NP not in office). Dr. Benay Spice had suggested her PCP manage her foot swelling at last visit.

## 2014-02-10 ENCOUNTER — Other Ambulatory Visit: Payer: Self-pay | Admitting: Oncology

## 2014-02-15 ENCOUNTER — Ambulatory Visit: Payer: Self-pay | Admitting: Nurse Practitioner

## 2014-02-15 ENCOUNTER — Encounter: Payer: Self-pay | Admitting: *Deleted

## 2014-02-15 ENCOUNTER — Other Ambulatory Visit (HOSPITAL_BASED_OUTPATIENT_CLINIC_OR_DEPARTMENT_OTHER): Payer: Commercial Managed Care - HMO

## 2014-02-15 ENCOUNTER — Ambulatory Visit (HOSPITAL_BASED_OUTPATIENT_CLINIC_OR_DEPARTMENT_OTHER): Payer: Commercial Managed Care - HMO

## 2014-02-15 ENCOUNTER — Ambulatory Visit (HOSPITAL_BASED_OUTPATIENT_CLINIC_OR_DEPARTMENT_OTHER): Payer: Commercial Managed Care - HMO | Admitting: Nurse Practitioner

## 2014-02-15 ENCOUNTER — Encounter: Payer: Self-pay | Admitting: Oncology

## 2014-02-15 VITALS — BP 155/59 | HR 69 | Temp 97.8°F | Resp 20 | Ht 61.0 in | Wt 197.9 lb

## 2014-02-15 VITALS — BP 136/51

## 2014-02-15 DIAGNOSIS — R609 Edema, unspecified: Secondary | ICD-10-CM

## 2014-02-15 DIAGNOSIS — C189 Malignant neoplasm of colon, unspecified: Secondary | ICD-10-CM

## 2014-02-15 DIAGNOSIS — K121 Other forms of stomatitis: Secondary | ICD-10-CM

## 2014-02-15 DIAGNOSIS — Z5112 Encounter for antineoplastic immunotherapy: Secondary | ICD-10-CM

## 2014-02-15 DIAGNOSIS — L270 Generalized skin eruption due to drugs and medicaments taken internally: Secondary | ICD-10-CM

## 2014-02-15 DIAGNOSIS — D6481 Anemia due to antineoplastic chemotherapy: Secondary | ICD-10-CM

## 2014-02-15 DIAGNOSIS — C787 Secondary malignant neoplasm of liver and intrahepatic bile duct: Secondary | ICD-10-CM

## 2014-02-15 DIAGNOSIS — K1230 Oral mucositis (ulcerative), unspecified: Secondary | ICD-10-CM

## 2014-02-15 DIAGNOSIS — T451X5A Adverse effect of antineoplastic and immunosuppressive drugs, initial encounter: Secondary | ICD-10-CM

## 2014-02-15 LAB — CBC WITH DIFFERENTIAL/PLATELET
BASO%: 0.6 % (ref 0.0–2.0)
BASOS ABS: 0 10*3/uL (ref 0.0–0.1)
EOS ABS: 0.4 10*3/uL (ref 0.0–0.5)
EOS%: 5.8 % (ref 0.0–7.0)
HEMATOCRIT: 32.1 % — AB (ref 34.8–46.6)
HGB: 10.9 g/dL — ABNORMAL LOW (ref 11.6–15.9)
LYMPH%: 24.3 % (ref 14.0–49.7)
MCH: 36.6 pg — ABNORMAL HIGH (ref 25.1–34.0)
MCHC: 34 g/dL (ref 31.5–36.0)
MCV: 107.7 fL — ABNORMAL HIGH (ref 79.5–101.0)
MONO#: 0.4 10*3/uL (ref 0.1–0.9)
MONO%: 6.7 % (ref 0.0–14.0)
NEUT%: 62.6 % (ref 38.4–76.8)
NEUTROS ABS: 3.9 10*3/uL (ref 1.5–6.5)
PLATELETS: 188 10*3/uL (ref 145–400)
RBC: 2.98 10*6/uL — ABNORMAL LOW (ref 3.70–5.45)
RDW: 19.3 % — ABNORMAL HIGH (ref 11.2–14.5)
WBC: 6.3 10*3/uL (ref 3.9–10.3)
lymph#: 1.5 10*3/uL (ref 0.9–3.3)
nRBC: 0 % (ref 0–0)

## 2014-02-15 LAB — UA PROTEIN, DIPSTICK - CHCC: Protein, ur: 30 mg/dL

## 2014-02-15 MED ORDER — SODIUM CHLORIDE 0.9 % IJ SOLN
10.0000 mL | INTRAMUSCULAR | Status: DC | PRN
  Administered 2014-02-15: 10 mL
  Filled 2014-02-15: qty 10

## 2014-02-15 MED ORDER — HEPARIN SOD (PORK) LOCK FLUSH 100 UNIT/ML IV SOLN
500.0000 [IU] | Freq: Once | INTRAVENOUS | Status: AC | PRN
  Administered 2014-02-15: 500 [IU]
  Filled 2014-02-15: qty 5

## 2014-02-15 MED ORDER — SODIUM CHLORIDE 0.9 % IV SOLN
Freq: Once | INTRAVENOUS | Status: AC
  Administered 2014-02-15: 13:00:00 via INTRAVENOUS

## 2014-02-15 MED ORDER — SODIUM CHLORIDE 0.9 % IV SOLN
7.5000 mg/kg | Freq: Once | INTRAVENOUS | Status: AC
  Administered 2014-02-15: 725 mg via INTRAVENOUS
  Filled 2014-02-15: qty 29

## 2014-02-15 NOTE — Progress Notes (Signed)
Faxed xeloda pa form to [facility]

## 2014-02-15 NOTE — Progress Notes (Signed)
RECEIVED A FAX FROM [employer] CONCERNING A PRIOR AUTHORIZATION FOR CAPECITABINE. THIS REQUEST WAS PLACED IN THE MANAGED CARE BIN.

## 2014-02-15 NOTE — Patient Instructions (Signed)
Bevacizumab injection  What is this medicine?  BEVACIZUMAB (be va SIZ yoo mab) is a chemotherapy drug. It targets a protein found in many cancer cell types, and halts cancer growth. This drug treats many cancers including non-small cell lung cancer, and colon or rectal cancer. It is usually given with other chemotherapy drugs.  This medicine may be used for other purposes; ask your health care provider or pharmacist if you have questions.  COMMON BRAND NAME(S): Avastin  What should I tell my health care provider before I take this medicine?  They need to know if you have any of these conditions:  -blood clots  -heart disease, including heart failure, heart attack, or chest pain (angina)  -high blood pressure  -infection (especially a virus infection such as chickenpox, cold sores, or herpes)  -kidney disease  -lung disease  -prior chemotherapy with doxorubicin, daunorubicin, epirubicin, or other anthracycline type chemotherapy agents  -recent or ongoing radiation therapy  -recent surgery  -stroke  -an unusual or allergic reaction to bevacizumab, hamster proteins, mouse proteins, other medicines, foods, dyes, or preservatives  -pregnant or trying to get pregnant  -breast-feeding  How should I use this medicine?  This medicine is for infusion into a vein. It is given by a health care professional in a hospital or clinic setting.  Talk to your pediatrician regarding the use of this medicine in children. Special care may be needed.  Overdosage: If you think you have taken too much of this medicine contact a poison control center or emergency room at once.  NOTE: This medicine is only for you. Do not share this medicine with others.  What if I miss a dose?  It is important not to miss your dose. Call your doctor or health care professional if you are unable to keep an appointment.  What may interact with this medicine?  Interactions are not expected.  This list may not describe all possible interactions. Give your health  care provider a list of all the medicines, herbs, non-prescription drugs, or dietary supplements you use. Also tell them if you smoke, drink alcohol, or use illegal drugs. Some items may interact with your medicine.  What should I watch for while using this medicine?  Your condition will be monitored carefully while you are receiving this medicine. You will need important blood work and urine testing done while you are taking this medicine.  During your treatment, let your health care professional know if you have any unusual symptoms, such as difficulty breathing.  This medicine may rarely cause 'gastrointestinal perforation' (holes in the stomach, intestines or colon), a serious side effect requiring surgery to repair.  This medicine should be started at least 28 days following major surgery and the site of the surgery should be totally healed. Check with your doctor before scheduling dental work or surgery while you are receiving this treatment. Talk to your doctor if you have recently had surgery or if you have a wound that has not healed.  Do not become pregnant while taking this medicine. Women should inform their doctor if they wish to become pregnant or think they might be pregnant. There is a potential for serious side effects to an unborn child. Talk to your health care professional or pharmacist for more information. Do not breast-feed an infant while taking this medicine.  This medicine has caused ovarian failure in some women. This medicine may interfere with the ability to have a child. You should talk to your doctor or health   care professional if you are concerned about your fertility.  What side effects may I notice from receiving this medicine?  Side effects that you should report to your doctor or health care professional as soon as possible:  -allergic reactions like skin rash, itching or hives, swelling of the face, lips, or tongue  -signs of infection - fever or chills, cough, sore throat, pain  or trouble passing urine  -signs of decreased platelets or bleeding - bruising, pinpoint red spots on the skin, black, tarry stools, nosebleeds, blood in the urine  -breathing problems  -changes in vision  -chest pain  -confusion  -jaw pain, especially after dental work  -mouth sores  -seizures  -severe abdominal pain  -severe headache  -sudden numbness or weakness of the face, arm or leg  -swelling of legs or ankles  -symptoms of a stroke: change in mental awareness, inability to talk or move one side of the body (especially in patients with lung cancer)  -trouble passing urine or change in the amount of urine  -trouble speaking or understanding  -trouble walking, dizziness, loss of balance or coordination  Side effects that usually do not require medical attention (report to your doctor or health care professional if they continue or are bothersome):  -constipation  -diarrhea  -dry skin  -headache  -loss of appetite  -nausea, vomiting  This list may not describe all possible side effects. Call your doctor for medical advice about side effects. You may report side effects to FDA at 1-800-FDA-1088.  Where should I keep my medicine?  This drug is given in a hospital or clinic and will not be stored at home.  NOTE: This sheet is a summary. It may not cover all possible information. If you have questions about this medicine, talk to your doctor, pharmacist, or health care provider.   2014, Elsevier/Gold Standard. (2010-10-16 16:25:37)

## 2014-02-15 NOTE — Progress Notes (Signed)
[facility]  Clinical Social Work was referred by nurse for assessment of psychosocial needs as pt requested to see a Print production planner" at her appointment today.  Clinical Social Worker was not in her office when RN called and then later phoned patient at home to offer support and assess for needs.  Pt declined talking over the phone currently and would like to see CSW at next appointment. Pt reports she had seen [facility] psychologist in the past. No appointments put in for follow up to date. CSW will try to check in on pt at next appt.     Clinical Social Work interventions: CSW to follow up at next appt.   Loren Racer, LCSW Clinical Social Worker [facility] Wednesday, Thursday and Friday Phone: [phone] Fax: [phone]

## 2014-02-15 NOTE — Progress Notes (Addendum)
OFFICE PROGRESS NOTE  Interval history:  Ms. Zurn returns for scheduled followup of metastatic colon cancer. She continues Xeloda every other week and every three-week Avastin. She had a minor nosebleed earlier in the week. No other bleeding. Stable mild dyspnea on exertion. Bowels moving. No recent nausea or vomiting. Hands feel cold.   Objective: Filed Vitals:   02/15/14 1204  BP: 155/59  Pulse: 69  Temp: 97.8 F (36.6 C)  Resp: 20   Oropharynx is without thrush or ulceration. Lungs clear. Regular cardiac rhythm. Port-A-Cath site without erythema. Abdomen is soft, mildly tender. Bowel sounds active. No hepatomegaly. No mass. 1+ pitting edema below the knees bilaterally. Palms with hyperpigmentation and a few areas of dry peeling skin.   Lab Results: Lab Results  Component Value Date   WBC 6.3 02/15/2014   HGB 10.9* 02/15/2014   HCT 32.1* 02/15/2014   MCV 107.7* 02/15/2014   PLT 188 02/15/2014   NEUTROABS 3.9 02/15/2014    Chemistry:    Chemistry      Component Value Date/Time   NA 139 01/25/2014 1221   NA 136 07/03/2013 0935   K 3.6 01/25/2014 1221   K 3.8 07/03/2013 0935   CL 99 07/03/2013 0935   CL 106 05/16/2013 1030   CO2 21* 01/25/2014 1221   CO2 24 07/03/2013 0935   BUN 26.5* 01/25/2014 1221   BUN 26* 07/03/2013 0935   CREATININE 1.2* 01/25/2014 1221   CREATININE 1.19* 07/03/2013 0935   CREATININE 1.29* 05/20/2009 1423   GLU 102* 10/19/2007 1433      Component Value Date/Time   CALCIUM 8.7 01/25/2014 1221   CALCIUM 10.0 07/03/2013 0935   ALKPHOS 151* 01/25/2014 1221   ALKPHOS 85 07/17/2012 1058   AST 25 01/25/2014 1221   AST 17 07/17/2012 1058   ALT 13 01/25/2014 1221   ALT 13 07/17/2012 1058   BILITOT 1.51* 01/25/2014 1221   BILITOT 0.4 07/17/2012 1058       Studies/Results: Nm Pet Image Restag (ps) Skull Base To Thigh  01/28/2014   CLINICAL DATA:  Subsequent treatment strategy for colorectal cancer.  EXAM: NUCLEAR MEDICINE PET SKULL BASE TO THIGH  FASTING BLOOD GLUCOSE:  Value:  196 mg/dl  TECHNIQUE: 11.4 mCi F-18 FDG was injected intravenously. Full-ring PET imaging was performed from the skull base to thigh after the radiotracer. CT data was obtained and used for attenuation correction and anatomic localization.  COMPARISON:  MRI from 11/16/2013.  PET-CT from 03/22/2011.  FINDINGS: NECK  No hypermetabolic lymph nodes in the neck.  CHEST  No hypermetabolic mediastinal or hilar nodes. No suspicious pulmonary nodules on the CT scan.  The tip of the left-sided Port-A-Cath is at the junction of the SVC and RA.  ABDOMEN/PELVIS  The 3 hypermetabolic liver lesions seen on the previous PET-CT are no longer evident. However, hypermetabolic uptake is identified in 2 of the lesions noted on the recent abdominal MRI.  The posterior right hepatic lesion (segment 7) is hypermetabolic with SUV max = 7. This corresponds to the 18 mm lesion seen on the MRI.  A second hypermetabolic focus is also seen in the posterior right liver, in a subcapsular location. This corresponds to the 24 mm lesion seen on the recent MRI. SUV max = 4 for this lesion.  The inferior right hepatic lesion which was measured at 14 mm on the recent MRI is not visualized on the PET images, possibly secondary to the small size and background hepatic uptake.  No other  evidence for hypermetabolic metastatic disease in the abdomen. The  There is been interval development of a mild amount of perihepatic ascites. Duodenum diverticulum is evident.  SKELETON  No focal hypermetabolic activity to suggest skeletal metastasis.  IMPRESSION: Two hypermetabolic lesions in the liver corresponding to the abnormality seen on the recent MRI. The third liver lesion seen on the recent MRI (characterized as the most inferior lesion on the MRI) is not discernible on today's PET imaging, but this may be secondary to the small size of this lesion and activity being obscured by background liver uptake.  No other evidence for hypermetabolic metastases in the  neck, chest, abdomen, or pelvis.   Electronically Signed   By: Misty Stanley M.D.   On: 01/28/2014 15:55    Medications: I have reviewed the patient's current medications.  Assessment/Plan: 1. Metastatic colon cancer presenting with synchronous colon primaries in September, 2008, status post a hemicolectomy and liver biopsy 08/03/2007. A CT on 04/20/2010 revealed no evidence metastatic disease. She was maintained on 5-FU/leucovorin per the FOLFOX regimen beginning in April, 2009. Avasta was discontinued from the chemotherapy regimen beginning in November, 2009 due to nephrotic range proteinuria. A CT of the abdomen and pelvis 02/05/2011 revealed evidence of three small liver lesions. An MRI of the abdomen on 03/02/2011 confirmed 3 liver lesions and no additional evidence of metastatic disease. K-RAS testing on the transverse colon and left colon tumors revealed a wild type genotype.  1A. Staging PET scan 03/21/2011 confirmed 3 hypermetabolic liver lesions and no additional evidence of metastatic disease.  1B. Surgical consultation by Dr. Barry Dienes was obtained and Ms. Coolman decided against an attempt at surgical resection/radiofrequency ablation.  1C. Salvage therapy with FOLFOX was initiated 04/13/2011.  1D. A restaging CT 06/28/2011 revealed a significant improvement in the hepatic metastasis.  1E. She completed eight cycles of FOLFOX chemotherapy. Treatment was switched to 5FU-leucovorin as per the FOLFOX regimen beginning with cycle #9 due to an allergic reaction (skin rash) following cycle #8.  36F. Restaging CT 10/25/2011 revealed 2 more conspicuous liver metastases .  1G. Restaging PET scan 12/24/2011 revealed an increase in the size and metabolic activity of 3 liver metastases  1H. She began FOLFIRI chemotherapy on 01/19/2012.  1 I. Restaging CT of the abdomen 05/08/2012 revealed a mixed response with enlargement of 2 liver lesions and a decrease in the size of a third lesion, no new lesions.   1J. FOLFIRI chemotherapy continued with the addition of Avastin beginning 05/09/2012.  1K. restaging CT 07/28/2012 with no new liver lesions and a decreased size of the previously noted 3 liver lesions.  1L. She was last treated with FOLFIRI/Avastin on 09/12/2012.  41M. She completed stereotactic radiotherapy to 3 liver metastases 10/10/2012 through 10/20/2012.  1N. Follow-up MRI of the abdomen 11/24/2012 showed improvement in the 3 hepatic metastases and no progressive disease.  1O. Restaging MRI 04/09/2013 showed improvement in the 3 treated hepatic lesions and 2 new lesions. No evidence of metastatic disease outside of liver.  1P. Initiation of Xeloda/Avastin 05/30/2013. Cycle 2 initiated on 06/20/2013 (Avastin placed on hold)-Xeloda discontinued after 10 days secondary to hand/foot syndrome. Cycle 3 Xeloda question initiated on 07/28/2013 (she is unable to recall when she started or stopped cycle 3). Cycle 4 08/20/2013. Cycle 5 09/14/2013 (no Xeloda). Cycle 6 Xeloda/Avastin 10/01/2013. Cycle 7 Xeloda/Avastin 11/02/2013  1Q. restaging MRI of the liver 11/16/2013-3 enhancing right liver lesion, slightly larger. No evidence of metastasis in the left hepatic lobe. 1R. Xeloda/Avastin continued.  1S. restaging PET scan 01/28/2014 showed 2 hypermetabolic lesions in the liver corresponding to the abnormality seen on the recent MRI. The third lesion seen on the recent MRI was not discernible on the PET. No other evidence for hypermetabolic metastases.  1. Diabetes. 2. History of proteinuria secondary to Avastin versus diabetes. 3. Mucositis and hand foot syndrome, secondary to 5FU improved with a dose reduction of the 5-FU. 4. Anemia secondary to chemotherapy.  5. Left back and left leg pain with an MRI of the lumbar spine confirming lumbar disk disease. She is status post surgery by Dr. Christella Noa 02/14/2008 with resolution of the back and left leg pain. 6. Rosacea. She is followed by dermatology. A recent  flare, now maintained on doxycycline 7. Depression, maintained on Cymbalta-she requested a referral to Dr. Ferdinand Lango 8. History of bilateral knee pain secondary to arthritis -- she takes Vicodin as needed. 9. History of anorexia -- weight loss -- ?Related to depression/anxiety or metastatic colon cancer. 10. Allergic reaction to oxaliplatin with FOLFOX on 04/13/2011. She was able to tolerate FOLFOX on 04/20/2011 with steroid premedication and a prolonged oxaliplatin infusion. After cycle #8 she developed a rash upon returning home with pruritus. She took Benadryl with resolution of symptoms. This reaction occurred despite steroid premedication. 11. Poor dentition -- she was evaluated by Dr. Enrique Sack. She underwent tooth extractions 07/04/2013. She now has dentures  12. Neutropenia following cycle 1 of FOLFIRI. The FOLFIRI was dose reduced beginning with cycle 2. She was neutropenic 03/14/2012 and chemotherapy was held for one week.  13. Nausea and diarrhea during the irinotecan infusion on 03/21/2012. Symptoms resolved with atropine. She receives atropine as a premedication with each cycle. 14. Diarrhea following the 05/09/2012 chemotherapy cycle. Likely related to irinotecan. She takes Imodium as needed. 15. Diffuse pain 4-5 days after chemotherapy 07/17/2012-likely toxicity from Neulasta. 16. History of a fall with fracture of the right fifth metacarpal. 17. Hand/foot syndrome secondary to Xeloda-the Xeloda was dose reduced beginning with cycle 3 on 07/23/2013 (?). She is now taking Xeloda 1 week on/1 week off. 18. Foot/low leg edema-? Etiology. She has not been taking Lasix consistently. The edema could be related to amlodipine  Dispositon-she appears stable. Dr. Benay Spice reviewed the recent PET scan findings with Ms. Cupit. Options were discussed including consideration of an ablation procedure with one lesion potentially requiring intraoperative ablation due to the location versus continuation of  Xeloda/Avastin as she is currently taking. She elects to continue Xeloda/Avastin.  Plan to proceed with treatment today as scheduled. She will return in 3 weeks for Avastin and in 6 weeks for a followup visit. She will contact the office in the interim with any problems.  Patient seen with Dr. Benay Spice.   Ned Card ANP/GNP-BC   This was a shared visit with Ned Card.   Her case was presented at the GI tumor conference this week. the PET scan reveals 2 hypermetabolic liver lesions.these lesions may be amenable to radioablation. It is very unlikely any therapy will be curative. I discussed treatment options with Ms. Klich including referral for radioablation and continuing systemic therapy. We decided to continue the current therapy with Xeloda/Avastin.  Julieanne Manson, M.D.

## 2014-02-16 LAB — CEA: CEA: 10.2 ng/mL — ABNORMAL HIGH (ref 0.0–5.0)

## 2014-02-18 ENCOUNTER — Encounter: Payer: Self-pay | Admitting: Oncology

## 2014-02-18 ENCOUNTER — Telehealth: Payer: Self-pay | Admitting: Oncology

## 2014-02-18 NOTE — Telephone Encounter (Signed)
lmonvm for pt re appts for 4/10 and 5/1. schedule mailed.

## 2014-02-18 NOTE — Progress Notes (Signed)
[employer] denied xeloda under Medicare Part D because it is a Medicare Part B drug.

## 2014-02-27 ENCOUNTER — Other Ambulatory Visit: Payer: Self-pay | Admitting: *Deleted

## 2014-02-27 DIAGNOSIS — C189 Malignant neoplasm of colon, unspecified: Secondary | ICD-10-CM

## 2014-02-27 NOTE — Telephone Encounter (Signed)
Xeloda script refill request to MD desk for approval.

## 2014-03-01 MED ORDER — CAPECITABINE 500 MG PO TABS: 1000.0000 mg | ORAL_TABLET | Freq: Two times a day (BID) | ORAL | Status: DC

## 2014-03-01 NOTE — Addendum Note (Signed)
Addended by: Wyonia Hough on: 03/01/2014 10:07 AM   Modules accepted: Orders

## 2014-03-03 ENCOUNTER — Other Ambulatory Visit: Payer: Self-pay | Admitting: Oncology

## 2014-03-07 ENCOUNTER — Ambulatory Visit (HOSPITAL_BASED_OUTPATIENT_CLINIC_OR_DEPARTMENT_OTHER): Payer: Medicare HMO

## 2014-03-07 ENCOUNTER — Telehealth: Payer: Self-pay | Admitting: *Deleted

## 2014-03-07 ENCOUNTER — Telehealth: Payer: Self-pay | Admitting: Oncology

## 2014-03-07 ENCOUNTER — Other Ambulatory Visit: Payer: Self-pay

## 2014-03-07 ENCOUNTER — Ambulatory Visit (HOSPITAL_BASED_OUTPATIENT_CLINIC_OR_DEPARTMENT_OTHER): Payer: Commercial Managed Care - HMO | Admitting: Oncology

## 2014-03-07 ENCOUNTER — Other Ambulatory Visit: Payer: Self-pay | Admitting: *Deleted

## 2014-03-07 VITALS — BP 135/71 | HR 79 | Temp 98.1°F | Resp 18 | Ht 61.0 in | Wt 195.3 lb

## 2014-03-07 DIAGNOSIS — C787 Secondary malignant neoplasm of liver and intrahepatic bile duct: Secondary | ICD-10-CM

## 2014-03-07 DIAGNOSIS — R609 Edema, unspecified: Secondary | ICD-10-CM

## 2014-03-07 DIAGNOSIS — C189 Malignant neoplasm of colon, unspecified: Secondary | ICD-10-CM

## 2014-03-07 DIAGNOSIS — R197 Diarrhea, unspecified: Secondary | ICD-10-CM

## 2014-03-07 DIAGNOSIS — R112 Nausea with vomiting, unspecified: Secondary | ICD-10-CM

## 2014-03-07 LAB — COMPREHENSIVE METABOLIC PANEL (CC13)
ALBUMIN: 2.9 g/dL — AB (ref 3.5–5.0)
ALK PHOS: 156 U/L — AB (ref 40–150)
ALT: 14 U/L (ref 0–55)
AST: 27 U/L (ref 5–34)
Anion Gap: 15 mEq/L — ABNORMAL HIGH (ref 3–11)
BUN: 21.3 mg/dL (ref 7.0–26.0)
CHLORIDE: 109 meq/L (ref 98–109)
CO2: 17 mEq/L — ABNORMAL LOW (ref 22–29)
Calcium: 9.7 mg/dL (ref 8.4–10.4)
Creatinine: 1.1 mg/dL (ref 0.6–1.1)
GLUCOSE: 102 mg/dL (ref 70–140)
POTASSIUM: 5 meq/L (ref 3.5–5.1)
Sodium: 142 mEq/L (ref 136–145)
TOTAL PROTEIN: 6.3 g/dL — AB (ref 6.4–8.3)
Total Bilirubin: 1.95 mg/dL — ABNORMAL HIGH (ref 0.20–1.20)

## 2014-03-07 LAB — CBC WITH DIFFERENTIAL/PLATELET
BASO%: 0.2 % (ref 0.0–2.0)
Basophils Absolute: 0 10*3/uL (ref 0.0–0.1)
EOS ABS: 0.4 10*3/uL (ref 0.0–0.5)
EOS%: 3.9 % (ref 0.0–7.0)
HCT: 31.4 % — ABNORMAL LOW (ref 34.8–46.6)
HEMOGLOBIN: 10.4 g/dL — AB (ref 11.6–15.9)
LYMPH%: 15.5 % (ref 14.0–49.7)
MCH: 36.4 pg — ABNORMAL HIGH (ref 25.1–34.0)
MCHC: 33.1 g/dL (ref 31.5–36.0)
MCV: 109.8 fL — ABNORMAL HIGH (ref 79.5–101.0)
MONO#: 0.5 10*3/uL (ref 0.1–0.9)
MONO%: 5.7 % (ref 0.0–14.0)
NEUT%: 74.7 % (ref 38.4–76.8)
NEUTROS ABS: 6.8 10*3/uL — AB (ref 1.5–6.5)
Platelets: 160 10*3/uL (ref 145–400)
RBC: 2.86 10*6/uL — AB (ref 3.70–5.45)
RDW: 18.5 % — AB (ref 11.2–14.5)
WBC: 9.2 10*3/uL (ref 3.9–10.3)
lymph#: 1.4 10*3/uL (ref 0.9–3.3)

## 2014-03-07 MED ORDER — PROCHLORPERAZINE MALEATE 10 MG PO TABS: 10.0000 mg | ORAL_TABLET | Freq: Four times a day (QID) | ORAL | Status: DC | PRN

## 2014-03-07 NOTE — Telephone Encounter (Signed)
per 4/9 pof cxd lb/tx 4/10. per pof pt to f/u as scheduled (5/1). per BS pt aware appts 4/10 cxd. mailed schedule for may. called pt but not able to reach her or lm - fax tied to phone line.

## 2014-03-07 NOTE — Telephone Encounter (Signed)
Pt's daughter Marchia Meiers called stating that "for 2-3 weeks mom has been throwing up a lot and has diarrhea"  She states that she doesn't do this everyday but "several times during the week" and thinks patient needs to be seen.  Last diarrhea was yesterday and daughter has started her on Imodium and she has not had any diarrhea today.  States "when pt throws-up, it's a lot"  Reports no fever; "pt fell this am getting out of chair and landed on bottom and back"  Daughter states they didn't take her to the doctor; pt stated she was OK.  Marchia Meiers states she "just thinks something is not right" and request pt to be seen.  Dr. Benay Spice made aware.  Per MD; instructed Romana to hold pt Xeloda and MD can see pt today at 3:30.  Marchia Meiers states she will be able to bring her this afternoon.

## 2014-03-07 NOTE — Progress Notes (Signed)
Emily Lawson OFFICE PROGRESS NOTE   Diagnosis: Metastatic colon cancer  INTERVAL HISTORY:   Emily Lawson was last treated with Avastin 02/15/2014. She is unclear when she began the most recent week of Xeloda, but states she missed several days last week. She last took a single Xeloda tablet earlier today.  She developed nausea and vomiting beginning yesterday. She also had 7 episodes of diarrhea yesterday. She had cramping abdominal pain with diarrhea. She vomited "grits "earlier today. She had an episode of vomiting when she arrived at the [facility].   No fever. She continues to have erythema and skin desquamation over the feet.  Objective:  Vital signs in last 24 hours:  Blood pressure 135/71, pulse 79, temperature 98.1 F (36.7 C), temperature source Oral, resp. rate 18, height _0  (1.549 m), weight 195 lb 4.8 oz (88.587 kg).    HEENT: No thrush or ulcers, the mucous membranes are moist Resp: Lungs clear bilaterally Cardio: Regular rate and rhythm GI: No hepatomegaly, nontender, soft, active bowel sounds Vascular: Trace low pretibial and foot edema bilaterally Neuro: Alert and oriented  Skin: Dry thickening of the palms and soles with mild erythema and superficial desquamation   Portacath/PICC-without erythema  Lab Results:  Lab Results  Component Value Date   WBC 9.2 03/07/2014   HGB 10.4* 03/07/2014   HCT 31.4* 03/07/2014   MCV 109.8* 03/07/2014   PLT 160 03/07/2014   NEUTROABS 6.8* 03/07/2014   Potassium 5.0, BUN 21.3, creatinine 1.1, glucose 102, bilirubin 1.95   Lab Results  Component Value Date   CEA 10.2* 02/15/2014     Medications: I have reviewed the patient's current medications.  Assessment/Plan: 1. Metastatic colon cancer presenting with synchronous colon primaries in September, 2008, status post a hemicolectomy and liver biopsy 08/03/2007. A CT on 04/20/2010 revealed no evidence metastatic disease. She was maintained on 5-FU/leucovorin per  the FOLFOX regimen beginning in April, 2009. Avasta was discontinued from the chemotherapy regimen beginning in November, 2009 due to nephrotic range proteinuria. A CT of the abdomen and pelvis 02/05/2011 revealed evidence of three small liver lesions. An MRI of the abdomen on 03/02/2011 confirmed 3 liver lesions and no additional evidence of metastatic disease. K-RAS testing on the transverse colon and left colon tumors revealed a wild type genotype.  1A. Staging PET scan 03/21/2011 confirmed 3 hypermetabolic liver lesions and no additional evidence of metastatic disease.  1B. Surgical consultation by Dr. Barry Dienes was obtained and Emily Lawson against an attempt at surgical resection/radiofrequency ablation.  1C. Salvage therapy with FOLFOX was initiated 04/13/2011.  1D. A restaging CT 06/28/2011 revealed a significant improvement in the hepatic metastasis.  1E. She completed eight cycles of FOLFOX chemotherapy. Treatment was switched to 5FU-leucovorin as per the FOLFOX regimen beginning with cycle #9 due to an allergic reaction (skin rash) following cycle #8.  63F. Restaging CT 10/25/2011 revealed 2 more conspicuous liver metastases .  1G. Restaging PET scan 12/24/2011 revealed an increase in the size and metabolic activity of 3 liver metastases  1H. She began FOLFIRI chemotherapy on 01/19/2012.  1 I. Restaging CT of the abdomen 05/08/2012 revealed a mixed response with enlargement of 2 liver lesions and a decrease in the size of a third lesion, no new lesions.  1J. FOLFIRI chemotherapy continued with the addition of Avastin beginning 05/09/2012.  1K. restaging CT 07/28/2012 with no new liver lesions and a decreased size of the previously noted 3 liver lesions.  1L. She was last treated  with FOLFIRI/Avastin on 09/12/2012.  51M. She completed stereotactic radiotherapy to 3 liver metastases 10/10/2012 through 10/20/2012.  1N. Follow-up MRI of the abdomen 11/24/2012 showed improvement in the 3 hepatic  metastases and no progressive disease.  1O. Restaging MRI 04/09/2013 showed improvement in the 3 treated hepatic lesions and 2 new lesions. No evidence of metastatic disease outside of liver.  1P. Initiation of Xeloda/Avastin 05/30/2013. Cycle 2 initiated on 06/20/2013 (Avastin placed on hold)-Xeloda discontinued after 10 days secondary to hand/foot syndrome. Cycle 3 Xeloda question initiated on 07/28/2013 (she is unable to recall when she started or stopped cycle 3). Cycle 4 08/20/2013. Cycle 5 09/14/2013 (no Xeloda). Cycle 6 Xeloda/Avastin 10/01/2013. Cycle 7 Xeloda/Avastin 11/02/2013  1Q. restaging MRI of the liver 11/16/2013-3 enhancing right liver lesion, slightly larger. No evidence of metastasis in the left hepatic lobe.  1R. Xeloda/Avastin continued.  1S. restaging PET scan 01/28/2014 showed 2 hypermetabolic lesions in the liver corresponding to the abnormality seen on the recent MRI. The third lesion seen on the recent MRI was not discernible on the PET. No other evidence for hypermetabolic metastases.  Emily Lawson continued 1. Diabetes. 2. History of proteinuria secondary to Avastin versus diabetes. 3. Mucositis and hand foot syndrome, secondary to 5FU improved with a dose reduction of the 5-FU. 4. Anemia secondary to chemotherapy.  5. Left back and left leg pain with an MRI of the lumbar spine confirming lumbar disk disease. She is status post surgery by Dr. Christella Noa 02/14/2008 with resolution of the back and left leg pain. 6. Rosacea. She is followed by dermatology. A recent flare, now maintained on doxycycline 7. Depression, maintained on Cymbalta-she requested a referral to Dr. Ferdinand Lango 8. History of bilateral knee pain secondary to arthritis -- she takes Vicodin as needed. 9. History of anorexia -- weight loss -- ?Related to depression/anxiety or metastatic colon cancer. 10. Allergic reaction to oxaliplatin with FOLFOX on 04/13/2011. She was able to tolerate FOLFOX on 04/20/2011  with steroid premedication and a prolonged oxaliplatin infusion. After cycle #8 she developed a rash upon returning home with pruritus. She took Benadryl with resolution of symptoms. This reaction occurred despite steroid premedication. 11. Poor dentition -- she was evaluated by Dr. Enrique Sack. She underwent tooth extractions 07/04/2013. She now has dentures  12. Neutropenia following cycle 1 of FOLFIRI. The FOLFIRI was dose reduced beginning with cycle 2. She was neutropenic 03/14/2012 and chemotherapy was held for one week.  13. Nausea and diarrhea during the irinotecan infusion on 03/21/2012. Symptoms resolved with atropine. She receives atropine as a premedication with each cycle. 14. Diarrhea following the 05/09/2012 chemotherapy cycle. Likely related to irinotecan. She takes Imodium as needed. 15. Diffuse pain 4-5 days after chemotherapy 07/17/2012-likely toxicity from Neulasta. 16. History of a fall with fracture of the right fifth metacarpal. 17. Hand/foot syndrome secondary to Xeloda-the Xeloda was dose reduced beginning with cycle 3 on 07/23/2013 (?). She is now taking Xeloda 1 week on/1 week off and the hand/foot symptoms persist 18. Foot/low leg edema-? Etiology. She has not been taking Lasix consistently. The edema could be related to amlodipine 19. Nausea/vomiting/diarrhea starting 03/06/2014   Disposition:  Emily Lawson presents prior to a scheduled visit with nausea/vomiting and diarrhea. It is possible her symptoms are related to Xeloda, a viral gastroenteritis, or metastatic colon cancer. She will discontinue Xeloda and we will hold the scheduled Avastin 03/08/2014. She does not appear dehydrated. She will continue Imodium as needed for diarrhea and push oral hydration. She was able to tolerate  liquids in the office by mouth. She will contact us if her symptoms are not improved 03/08/2014. I asked her to hold the furosemide and HCTZ until the diarrhea resolves.    Ladell Pier,  MD  03/07/2014  5:05 PM

## 2014-03-08 ENCOUNTER — Other Ambulatory Visit: Payer: Self-pay

## 2014-03-08 ENCOUNTER — Ambulatory Visit: Payer: Self-pay

## 2014-03-24 ENCOUNTER — Other Ambulatory Visit: Payer: Self-pay | Admitting: Oncology

## 2014-03-25 ENCOUNTER — Telehealth: Payer: Self-pay | Admitting: Dietician

## 2014-03-25 NOTE — Telephone Encounter (Signed)
Brief Outpatient Oncology Nutrition Note  Patient has been identified to be at risk on malnutrition screen.  Wt Readings from Last 10 Encounters:  03/07/14 195 lb 4.8 oz (88.587 kg)  02/15/14 197 lb 14.4 oz (89.767 kg)  01/25/14 200 lb 4.8 oz (90.855 kg)  01/04/14 196 lb 1.6 oz (88.95 kg)  12/14/13 199 lb (90.266 kg)  11/23/13 199 lb 3.2 oz (90.357 kg)  10/30/13 204 lb 12.8 oz (92.897 kg)  10/01/13 207 lb 8 oz (94.121 kg)  08/13/13 199 lb 4.8 oz (90.402 kg)  07/19/13 205 lb 8 oz (93.214 kg)       Dx:  Metastatic colon cancer.  Patient of Dr. Benay Spice.  Called patient due to weight loss who was unavailable. Noted N/V/D problems recently.  Contact information for the [facility] provided.  Antonieta Iba, RD, LDN

## 2014-03-29 ENCOUNTER — Ambulatory Visit (HOSPITAL_BASED_OUTPATIENT_CLINIC_OR_DEPARTMENT_OTHER): Payer: Commercial Managed Care - HMO | Admitting: Oncology

## 2014-03-29 ENCOUNTER — Telehealth: Payer: Self-pay | Admitting: *Deleted

## 2014-03-29 ENCOUNTER — Telehealth: Payer: Self-pay | Admitting: Oncology

## 2014-03-29 ENCOUNTER — Ambulatory Visit (HOSPITAL_BASED_OUTPATIENT_CLINIC_OR_DEPARTMENT_OTHER): Payer: Medicare HMO

## 2014-03-29 ENCOUNTER — Ambulatory Visit (HOSPITAL_BASED_OUTPATIENT_CLINIC_OR_DEPARTMENT_OTHER): Payer: Commercial Managed Care - HMO

## 2014-03-29 ENCOUNTER — Encounter: Payer: Self-pay | Admitting: *Deleted

## 2014-03-29 VITALS — BP 147/55 | HR 75 | Temp 98.4°F | Resp 17 | Ht 61.0 in | Wt 190.6 lb

## 2014-03-29 DIAGNOSIS — T451X5A Adverse effect of antineoplastic and immunosuppressive drugs, initial encounter: Secondary | ICD-10-CM

## 2014-03-29 DIAGNOSIS — D6481 Anemia due to antineoplastic chemotherapy: Secondary | ICD-10-CM

## 2014-03-29 DIAGNOSIS — C787 Secondary malignant neoplasm of liver and intrahepatic bile duct: Secondary | ICD-10-CM

## 2014-03-29 DIAGNOSIS — Z95828 Presence of other vascular implants and grafts: Secondary | ICD-10-CM

## 2014-03-29 DIAGNOSIS — Z5112 Encounter for antineoplastic immunotherapy: Secondary | ICD-10-CM

## 2014-03-29 DIAGNOSIS — C189 Malignant neoplasm of colon, unspecified: Secondary | ICD-10-CM

## 2014-03-29 LAB — COMPREHENSIVE METABOLIC PANEL (CC13)
ALK PHOS: 184 U/L — AB (ref 40–150)
ALT: 15 U/L (ref 0–55)
AST: 27 U/L (ref 5–34)
Albumin: 2.9 g/dL — ABNORMAL LOW (ref 3.5–5.0)
Anion Gap: 15 mEq/L — ABNORMAL HIGH (ref 3–11)
BILIRUBIN TOTAL: 1.24 mg/dL — AB (ref 0.20–1.20)
BUN: 16.8 mg/dL (ref 7.0–26.0)
CO2: 16 mEq/L — ABNORMAL LOW (ref 22–29)
Calcium: 9.4 mg/dL (ref 8.4–10.4)
Chloride: 109 mEq/L (ref 98–109)
Creatinine: 1.1 mg/dL (ref 0.6–1.1)
GLUCOSE: 185 mg/dL — AB (ref 70–140)
POTASSIUM: 4.6 meq/L (ref 3.5–5.1)
Sodium: 140 mEq/L (ref 136–145)
TOTAL PROTEIN: 6.4 g/dL (ref 6.4–8.3)

## 2014-03-29 LAB — CBC WITH DIFFERENTIAL/PLATELET
BASO%: 0.8 % (ref 0.0–2.0)
Basophils Absolute: 0.1 10*3/uL (ref 0.0–0.1)
EOS%: 9.9 % — AB (ref 0.0–7.0)
Eosinophils Absolute: 0.8 10*3/uL — ABNORMAL HIGH (ref 0.0–0.5)
HCT: 30.8 % — ABNORMAL LOW (ref 34.8–46.6)
HGB: 10.2 g/dL — ABNORMAL LOW (ref 11.6–15.9)
LYMPH%: 20.2 % (ref 14.0–49.7)
MCH: 36 pg — AB (ref 25.1–34.0)
MCHC: 33 g/dL (ref 31.5–36.0)
MCV: 109 fL — AB (ref 79.5–101.0)
MONO#: 0.4 10*3/uL (ref 0.1–0.9)
MONO%: 5.4 % (ref 0.0–14.0)
NEUT#: 4.9 10*3/uL (ref 1.5–6.5)
NEUT%: 63.7 % (ref 38.4–76.8)
PLATELETS: 238 10*3/uL (ref 145–400)
RBC: 2.83 10*6/uL — ABNORMAL LOW (ref 3.70–5.45)
RDW: 18.9 % — ABNORMAL HIGH (ref 11.2–14.5)
WBC: 7.7 10*3/uL (ref 3.9–10.3)
lymph#: 1.6 10*3/uL (ref 0.9–3.3)

## 2014-03-29 LAB — UA PROTEIN, DIPSTICK - CHCC: PROTEIN: 100 mg/dL

## 2014-03-29 MED ORDER — SODIUM CHLORIDE 0.9 % IV SOLN
650.0000 mg | Freq: Once | INTRAVENOUS | Status: AC
  Administered 2014-03-29: 650 mg via INTRAVENOUS
  Filled 2014-03-29: qty 26

## 2014-03-29 MED ORDER — SODIUM CHLORIDE 0.9 % IJ SOLN
10.0000 mL | INTRAMUSCULAR | Status: DC | PRN
  Administered 2014-03-29: 10 mL
  Filled 2014-03-29: qty 10

## 2014-03-29 MED ORDER — HEPARIN SOD (PORK) LOCK FLUSH 100 UNIT/ML IV SOLN
500.0000 [IU] | Freq: Once | INTRAVENOUS | Status: DC | PRN
  Filled 2014-03-29: qty 5

## 2014-03-29 MED ORDER — SODIUM CHLORIDE 0.9 % IV SOLN
Freq: Once | INTRAVENOUS | Status: AC
  Administered 2014-03-29: 13:00:00 via INTRAVENOUS

## 2014-03-29 MED ORDER — SODIUM CHLORIDE 0.9 % IJ SOLN
10.0000 mL | INTRAMUSCULAR | Status: DC | PRN
  Administered 2014-03-29: 10 mL via INTRAVENOUS
  Filled 2014-03-29: qty 10

## 2014-03-29 MED ORDER — HEPARIN SOD (PORK) LOCK FLUSH 100 UNIT/ML IV SOLN
500.0000 [IU] | Freq: Once | INTRAVENOUS | Status: AC
  Administered 2014-03-29: 500 [IU] via INTRAVENOUS
  Filled 2014-03-29: qty 5

## 2014-03-29 NOTE — Progress Notes (Signed)
Determined that patient would benefit from seeing social worker today due to high level of anxiety and tension between her and her daughter (caregiver). Patient's dementia seems to be progressing also along with her baseline depression. Has been taking metformin incorrectly which has resulted in low glucose readings. Suggested she obtain pill box and allow her children to dispense her medications for safety. Poorly  motivated to be active or do anything for herself according to family member. Social worker, Loren Racer will make contact with Emily Lawson & daughter today. Also requesting dietician consult face to face and not by telephone.

## 2014-03-29 NOTE — Telephone Encounter (Signed)
Per staff message and POF I have scheduled appts.  JMW  

## 2014-03-29 NOTE — Patient Instructions (Signed)

## 2014-03-29 NOTE — Patient Instructions (Signed)
[facility] Discharge Instructions for Patients Receiving Chemotherapy  Today you received the following chemotherapy agents: Avastin  To help prevent nausea and vomiting after your treatment, we encourage you to take your nausea medication: Compazine 10 mg every 6 hrs as needed.    If you develop nausea and vomiting that is not controlled by your nausea medication, call the clinic.   BELOW ARE SYMPTOMS THAT SHOULD BE REPORTED IMMEDIATELY:  *FEVER GREATER THAN 100.5 F  *CHILLS WITH OR WITHOUT FEVER  NAUSEA AND VOMITING THAT IS NOT CONTROLLED WITH YOUR NAUSEA MEDICATION  *UNUSUAL SHORTNESS OF BREATH  *UNUSUAL BRUISING OR BLEEDING  TENDERNESS IN MOUTH AND THROAT WITH OR WITHOUT PRESENCE OF ULCERS  *URINARY PROBLEMS  *BOWEL PROBLEMS  UNUSUAL RASH Items with * indicate a potential emergency and should be followed up as soon as possible.  Feel free to call the clinic you have any questions or concerns. The clinic phone number is [phone].

## 2014-03-29 NOTE — Patient Instructions (Addendum)
[place]  Please try to bring in copy of Advanced Directive at next appointment to be scanned into your medical record.  Fall Prevention and Home Safety Falls cause injuries and can affect all age groups. It is possible to use preventive measures to significantly decrease the likelihood of falls. There are many simple measures which can make your home safer and prevent falls. OUTDOORS  Repair cracks and edges of walkways and driveways.  Remove high doorway thresholds.  Trim shrubbery on the main path into your home.  Have good outside lighting.  Clear walkways of tools, rocks, debris, and clutter.  Check that handrails are not broken and are securely fastened. Both sides of steps should have handrails.  Have leaves, snow, and ice cleared regularly.  Use sand or salt on walkways during winter months.  In the garage, clean up grease or oil spills. BATHROOM  Install night lights.  Install grab bars by the toilet and in the tub and shower.  Use non-skid mats or decals in the tub or shower.  Place a plastic non-slip stool in the shower to sit on, if needed.  Keep floors dry and clean up all water on the floor immediately.  Remove soap buildup in the tub or shower on a regular basis.  Secure bath mats with non-slip, double-sided rug tape.  Remove throw rugs and tripping hazards from the floors. BEDROOMS  Install night lights.  Make sure a bedside light is easy to reach.  Do not use oversized bedding.  Keep a telephone by your bedside.  Have a firm chair with side arms to use for getting dressed.  Remove throw rugs and tripping hazards from the floor. KITCHEN  Keep handles on pots and pans turned toward the center of the stove. Use back burners when possible.  Clean up spills quickly and allow time for drying.  Avoid walking on wet floors.  Avoid hot utensils and knives.  Position shelves so they are not too high or low.  Place commonly  used objects within easy reach.  If necessary, use a sturdy step stool with a grab bar when reaching.  Keep electrical cables out of the way.  Do not use floor polish or wax that makes floors slippery. If you must use wax, use non-skid floor wax.  Remove throw rugs and tripping hazards from the floor. STAIRWAYS  Never leave objects on stairs.  Place handrails on both sides of stairways and use them. Fix any loose handrails. Make sure handrails on both sides of the stairways are as long as the stairs.  Check carpeting to make sure it is firmly attached along stairs. Make repairs to worn or loose carpet promptly.  Avoid placing throw rugs at the top or bottom of stairways, or properly secure the rug with carpet tape to prevent slippage. Get rid of throw rugs, if possible.  Have an electrician put in a light switch at the top and bottom of the stairs. OTHER FALL PREVENTION TIPS  Wear low-heel or rubber-soled shoes that are supportive and fit well. Wear closed toe shoes.  When using a stepladder, make sure it is fully opened and both spreaders are firmly locked. Do not climb a closed stepladder.  Add color or contrast paint or tape to grab bars and handrails in your home. Place contrasting color strips on first and last steps.  Learn and use mobility aids as needed. Install an electrical emergency response system.  Turn on lights to avoid dark areas.  Replace light bulbs that burn out immediately. Get light switches that glow.  Arrange furniture to create clear pathways. Keep furniture in the same place.  Firmly attach carpet with non-skid or double-sided tape.  Eliminate uneven floor surfaces.  Select a carpet pattern that does not visually hide the edge of steps.  Be aware of all pets. OTHER HOME SAFETY TIPS  Set the water temperature for 120 F (48.8 C).  Keep emergency numbers on or near the telephone.  Keep smoke detectors on every level of the home and near sleeping  areas. Document Released: 11/05/2002 Document Revised: 05/16/2012 Document Reviewed: 02/04/2012 [facility] Patient Information 2014 [place].                        [place]:  Per Dr. Benay Spice, resume your chemo pill, Xeloda 500 mg twice a day for 7 days on and 7 days off--start today.

## 2014-03-29 NOTE — Progress Notes (Signed)
[place] Work  Clinical Social Work was referred by nurse for assessment of psychosocial needs due to concerns about caregiver and pt distress.  Clinical Education officer, museum met with pt and her daughter separately at [facility] to offer support and assess for needs.  Pt is familiar to this CSW as she often requests assistance or counseling by the "psychologist" at her visits. Pt has been in counseling here in the past when there was a psychologist on staff. CSW met with pt in the chemo room while her daughter was going to scheduling. Pt asked for help for her husband who appears to have extensive medical concerns as well. Pt seemed forgetful during our conversation and often lost the topic we were on and asked "what were we talking about". Pt did share they receive help from family, meals on wheels and her husband receives help from a caregiver two hours a day. She really wanted CSW to arrange for someone to take her husband for a "ride in the car". CSW explained to pt there really wasn't a resource for that currently. Pt appreciative of support.   Daughter, Donnald Garre then came to infusion room and CSW was able to discuss caregiver supports needed and how things were going at home. Daughter successfully has completed medicaid application process and reports pt should be approved and can then begin receiving CAP services. CSW explained how CSW could further assist with this as needed. CSW and daughter were also able to meet alone and daughter disclosed typical emotions experienced as a caregiver. CSW discussed common emotions, ways to cope and resources in the community to assist caregivers. Daughter is very open to additional supports. She is also open to further counseling supports for both of them. Daughter agrees to talking with CSW again in the next week.     Clinical Social Work interventions: Supportive Warden/ranger Coping techniques  Loren Racer, [place] Clinical Social Worker [place]  [facility] [place] for [place] Wednesday, Thursday and Friday Phone: [phone] Fax: [phone]

## 2014-03-29 NOTE — Telephone Encounter (Signed)
Gave pt's daughter lab,md and chemo for May 2015

## 2014-03-29 NOTE — Progress Notes (Signed)
Okay to tx with urine protein-100 per Dr. Benay Spice.

## 2014-03-29 NOTE — Progress Notes (Signed)
[facility] OFFICE PROGRESS NOTE   Diagnosis: Colon cancer  INTERVAL HISTORY:   Ms. Fencl returns as scheduled. The diarrhea and nausea have resolved. The hand and foot symptoms have improved. She complains of anorexia. She reports that she was taking an excessive dose of Glucophage when she was last here.  Objective:  Vital signs in last 24 hours:  Blood pressure 147/55, pulse 75, temperature 98.4 F (36.9 C), temperature source Oral, resp. rate 17, height _0  (1.549 m), weight 190 lb 9.6 oz (86.456 kg), SpO2 100.00%.    HEENT: No thrush or ulcers Resp: Lungs clear bilaterally Cardio: Regular rate and rhythm, 2/6 systolic murmur GI: No hepatomegaly, nontender Vascular: Trace edema at the left greater than right lower leg  Skin: Skin thickening and hyperpigmentation over the hands and feet. Mild erythema at the toes. No discrete ulcers.   Portacath/PICC-without erythema  Lab Results:  Lab Results  Component Value Date   WBC 7.7 03/29/2014   HGB 10.2* 03/29/2014   HCT 30.8* 03/29/2014   MCV 109.0* 03/29/2014   PLT 238 03/29/2014   NEUTROABS 4.9 03/29/2014      Lab Results  Component Value Date   CEA 10.2* 02/15/2014    Medications: I have reviewed the patient's current medications.  Assessment/Plan: 1. Metastatic colon cancer presenting with synchronous colon primaries in September, 2008, status post a hemicolectomy and liver biopsy 08/03/2007. A CT on 04/20/2010 revealed no evidence metastatic disease. She was maintained on 5-FU/leucovorin per the FOLFOX regimen beginning in April, 2009. Avasta was discontinued from the chemotherapy regimen beginning in November, 2009 due to nephrotic range proteinuria. A CT of the abdomen and pelvis 02/05/2011 revealed evidence of three small liver lesions. An MRI of the abdomen on 03/02/2011 confirmed 3 liver lesions and no additional evidence of metastatic disease. K-RAS testing on the transverse colon and left colon tumors  revealed a wild type genotype.  1A. Staging PET scan 03/21/2011 confirmed 3 hypermetabolic liver lesions and no additional evidence of metastatic disease.  1B. Surgical consultation by Dr. Barry Dienes was obtained and Ms. Kloc decided against an attempt at surgical resection/radiofrequency ablation.  1C. Salvage therapy with FOLFOX was initiated 04/13/2011.  1D. A restaging CT 06/28/2011 revealed a significant improvement in the hepatic metastasis.  1E. She completed eight cycles of FOLFOX chemotherapy. Treatment was switched to 5FU-leucovorin as per the FOLFOX regimen beginning with cycle #9 due to an allergic reaction (skin rash) following cycle #8.  66F. Restaging CT 10/25/2011 revealed 2 more conspicuous liver metastases .  1G. Restaging PET scan 12/24/2011 revealed an increase in the size and metabolic activity of 3 liver metastases  1H. She began FOLFIRI chemotherapy on 01/19/2012.  1 I. Restaging CT of the abdomen 05/08/2012 revealed a mixed response with enlargement of 2 liver lesions and a decrease in the size of a third lesion, no new lesions.  1J. FOLFIRI chemotherapy continued with the addition of Avastin beginning 05/09/2012.  1K. restaging CT 07/28/2012 with no new liver lesions and a decreased size of the previously noted 3 liver lesions.  1L. She was last treated with FOLFIRI/Avastin on 09/12/2012.  61M. She completed stereotactic radiotherapy to 3 liver metastases 10/10/2012 through 10/20/2012.  1N. Follow-up MRI of the abdomen 11/24/2012 showed improvement in the 3 hepatic metastases and no progressive disease.  1O. Restaging MRI 04/09/2013 showed improvement in the 3 treated hepatic lesions and 2 new lesions. No evidence of metastatic disease outside of liver.  1P. Initiation of Xeloda/Avastin 05/30/2013.  Cycle 2 initiated on 06/20/2013 (Avastin placed on hold)-Xeloda discontinued after 10 days secondary to hand/foot syndrome. Cycle 3 Xeloda question initiated on 07/28/2013 (she is  unable to recall when she started or stopped cycle 3). Cycle 4 08/20/2013. Cycle 5 09/14/2013 (no Xeloda). Cycle 6 Xeloda/Avastin 10/01/2013. Cycle 7 Xeloda/Avastin 11/02/2013  1Q. restaging MRI of the liver 11/16/2013-3 enhancing right liver lesion, slightly larger. No evidence of metastasis in the left hepatic lobe.  1R. Xeloda/Avastin continued.  1S. restaging PET scan 01/28/2014 showed 2 hypermetabolic lesions in the liver corresponding to the abnormality seen on the recent MRI. The third lesion seen on the recent MRI was not discernible on the PET. No other evidence for hypermetabolic metastases.  [name] continued  1. Diabetes. 2. History of proteinuria secondary to Avastin versus diabetes. 3. Mucositis and hand foot syndrome, secondary to 5FU improved with a dose reduction of the 5-FU. 4. Anemia secondary to chemotherapy.  5. Left back and left leg pain with an MRI of the lumbar spine confirming lumbar disk disease. She is status post surgery by Dr. Christella Noa 02/14/2008 with resolution of the back and left leg pain. 6. Rosacea. She is followed by dermatology. A recent flare, now maintained on doxycycline 7. Depression, maintained on Cymbalta-she requested a referral to Dr. Ferdinand Lango 8. History of bilateral knee pain secondary to arthritis -- she takes Vicodin as needed. 9. History of anorexia -- weight loss -- ?Related to depression/anxiety or metastatic colon cancer. 10. Allergic reaction to oxaliplatin with FOLFOX on 04/13/2011. She was able to tolerate FOLFOX on 04/20/2011 with steroid premedication and a prolonged oxaliplatin infusion. After cycle #8 she developed a rash upon returning home with pruritus. She took Benadryl with resolution of symptoms. This reaction occurred despite steroid premedication. 11. Poor dentition -- she was evaluated by Dr. Enrique Sack. She underwent tooth extractions 07/04/2013. She now has dentures  12. Neutropenia following cycle 1 of FOLFIRI. The FOLFIRI  was dose reduced beginning with cycle 2. She was neutropenic 03/14/2012 and chemotherapy was held for one week.  13. Nausea and diarrhea during the irinotecan infusion on 03/21/2012. Symptoms resolved with atropine. She receives atropine as a premedication with each cycle. 14. Diarrhea following the 05/09/2012 chemotherapy cycle. Likely related to irinotecan. She takes Imodium as needed. 15. Diffuse pain 4-5 days after chemotherapy 07/17/2012-likely toxicity from Neulasta. 16. History of a fall with fracture of the right fifth metacarpal. 17. Hand/foot syndrome secondary to Xeloda-the Xeloda was dose reduced beginning with cycle 3 on 07/23/2013 (?). She is now taking Xeloda 1 week on/1 week off and the hand/foot symptoms persist 18. Nausea/vomiting/diarrhea starting 03/06/2014-secondary to Xeloda versus Glucophage versus gastroenteritis-resolved  Disposition:  Ms. Calvert appears improved compared to when she was here on 03/07/2014. It is not clear whether the diarrhea and nausea she had last month were related to capecitabine or another etiology. There is no clear evidence for progression of the metastatic colon cancer. I discussed treatment options with Ms. Haman and her daughter. We discussed continuing Avastin with a 5-fluorouracil pump or Xeloda. She prefers to continue Xeloda. We decided to further dose reduce the Xeloda (500 mg twice daily) and continue on a 7 day on/7 day off schedule.  Ms. Driskill will return for an office visit, CEA, and Avastin in 3 weeks.    Ladell Pier, MD  03/29/2014  12:26 PM

## 2014-03-30 LAB — CEA: CEA: 8.6 ng/mL — ABNORMAL HIGH (ref 0.0–5.0)

## 2014-04-02 ENCOUNTER — Other Ambulatory Visit: Payer: Self-pay | Admitting: *Deleted

## 2014-04-02 NOTE — Telephone Encounter (Signed)
THIS REFILL REQUEST FOR CAPECITABINE WAS GIVEN TO DR.SHERRILL'S NURSE, SUSAN COWARD,RN. 

## 2014-04-03 ENCOUNTER — Other Ambulatory Visit: Payer: Self-pay | Admitting: *Deleted

## 2014-04-03 DIAGNOSIS — C189 Malignant neoplasm of colon, unspecified: Secondary | ICD-10-CM

## 2014-04-03 MED ORDER — CAPECITABINE 500 MG PO TABS: 500.0000 mg | ORAL_TABLET | Freq: Two times a day (BID) | ORAL | Status: DC

## 2014-04-08 ENCOUNTER — Telehealth: Payer: Self-pay | Admitting: Dietician

## 2014-04-08 NOTE — Telephone Encounter (Signed)
Brief Outpatient Oncology Nutrition Note  Patient has been identified to be at risk on malnutrition screen.  Wt Readings from Last 10 Encounters:  03/29/14 190 lb 9.6 oz (86.456 kg)  03/07/14 195 lb 4.8 oz (88.587 kg)  02/15/14 197 lb 14.4 oz (89.767 kg)  01/25/14 200 lb 4.8 oz (90.855 kg)  01/04/14 196 lb 1.6 oz (88.95 kg)  12/14/13 199 lb (90.266 kg)  11/23/13 199 lb 3.2 oz (90.357 kg)  10/30/13 204 lb 12.8 oz (92.897 kg)  10/01/13 207 lb 8 oz (94.121 kg)  08/13/13 199 lb 4.8 oz (90.402 kg)      Dx:  Metastatic colon cancer  Called patient regarding recent weight loss.  Patient states that she has requested to speak with the dietitian in person during her next chemo visit.  Request sent to scheduler.    Antonieta Iba, RD, LDN

## 2014-04-14 ENCOUNTER — Other Ambulatory Visit: Payer: Self-pay | Admitting: Oncology

## 2014-04-18 ENCOUNTER — Ambulatory Visit (HOSPITAL_BASED_OUTPATIENT_CLINIC_OR_DEPARTMENT_OTHER): Payer: Commercial Managed Care - HMO

## 2014-04-18 ENCOUNTER — Other Ambulatory Visit: Payer: Self-pay | Admitting: *Deleted

## 2014-04-18 ENCOUNTER — Other Ambulatory Visit (HOSPITAL_BASED_OUTPATIENT_CLINIC_OR_DEPARTMENT_OTHER): Payer: Commercial Managed Care - HMO

## 2014-04-18 ENCOUNTER — Ambulatory Visit (HOSPITAL_BASED_OUTPATIENT_CLINIC_OR_DEPARTMENT_OTHER): Payer: Commercial Managed Care - HMO | Admitting: Nurse Practitioner

## 2014-04-18 ENCOUNTER — Other Ambulatory Visit: Payer: Self-pay | Admitting: Medical Oncology

## 2014-04-18 ENCOUNTER — Ambulatory Visit: Payer: Commercial Managed Care - HMO | Admitting: Nutrition

## 2014-04-18 VITALS — BP 134/55 | HR 67 | Temp 97.9°F | Resp 18 | Ht 61.0 in | Wt 189.6 lb

## 2014-04-18 DIAGNOSIS — C787 Secondary malignant neoplasm of liver and intrahepatic bile duct: Secondary | ICD-10-CM

## 2014-04-18 DIAGNOSIS — C189 Malignant neoplasm of colon, unspecified: Secondary | ICD-10-CM

## 2014-04-18 DIAGNOSIS — Z5112 Encounter for antineoplastic immunotherapy: Secondary | ICD-10-CM

## 2014-04-18 DIAGNOSIS — M6281 Muscle weakness (generalized): Secondary | ICD-10-CM

## 2014-04-18 LAB — URINALYSIS, MICROSCOPIC - CHCC
Bilirubin (Urine): NEGATIVE
Blood: NEGATIVE
Glucose: NEGATIVE mg/dL
KETONES: NEGATIVE mg/dL
LEUKOCYTE ESTERASE: NEGATIVE
Nitrite: NEGATIVE
PH: 6 (ref 4.6–8.0)
PROTEIN: 100 mg/dL
RBC / HPF: NEGATIVE (ref 0–2)
Specific Gravity, Urine: 1.025 (ref 1.003–1.035)
Urobilinogen, UR: 0.2 mg/dL (ref 0.2–1)

## 2014-04-18 LAB — CBC WITH DIFFERENTIAL/PLATELET
BASO%: 1.2 % (ref 0.0–2.0)
BASOS ABS: 0.1 10*3/uL (ref 0.0–0.1)
EOS%: 6 % (ref 0.0–7.0)
Eosinophils Absolute: 0.4 10*3/uL (ref 0.0–0.5)
HCT: 30.4 % — ABNORMAL LOW (ref 34.8–46.6)
HEMOGLOBIN: 10 g/dL — AB (ref 11.6–15.9)
LYMPH#: 1.4 10*3/uL (ref 0.9–3.3)
LYMPH%: 21.3 % (ref 14.0–49.7)
MCH: 35.3 pg — ABNORMAL HIGH (ref 25.1–34.0)
MCHC: 33 g/dL (ref 31.5–36.0)
MCV: 107.1 fL — ABNORMAL HIGH (ref 79.5–101.0)
MONO#: 0.4 10*3/uL (ref 0.1–0.9)
MONO%: 6.5 % (ref 0.0–14.0)
NEUT#: 4.1 10*3/uL (ref 1.5–6.5)
NEUT%: 65 % (ref 38.4–76.8)
Platelets: 193 10*3/uL (ref 145–400)
RBC: 2.84 10*6/uL — ABNORMAL LOW (ref 3.70–5.45)
RDW: 19.3 % — AB (ref 11.2–14.5)
WBC: 6.4 10*3/uL (ref 3.9–10.3)

## 2014-04-18 LAB — UA PROTEIN, DIPSTICK - CHCC: Protein, ur: 100 mg/dL

## 2014-04-18 LAB — COMPREHENSIVE METABOLIC PANEL (CC13)
ALT: 17 U/L (ref 0–55)
AST: 26 U/L (ref 5–34)
Albumin: 3 g/dL — ABNORMAL LOW (ref 3.5–5.0)
Alkaline Phosphatase: 151 U/L — ABNORMAL HIGH (ref 40–150)
Anion Gap: 17 mEq/L — ABNORMAL HIGH (ref 3–11)
BUN: 21.6 mg/dL (ref 7.0–26.0)
CALCIUM: 9.9 mg/dL (ref 8.4–10.4)
CHLORIDE: 103 meq/L (ref 98–109)
CO2: 20 mEq/L — ABNORMAL LOW (ref 22–29)
Creatinine: 1.1 mg/dL (ref 0.6–1.1)
Glucose: 159 mg/dl — ABNORMAL HIGH (ref 70–140)
Potassium: 4 mEq/L (ref 3.5–5.1)
SODIUM: 140 meq/L (ref 136–145)
TOTAL PROTEIN: 6.2 g/dL — AB (ref 6.4–8.3)
Total Bilirubin: 2.3 mg/dL — ABNORMAL HIGH (ref 0.20–1.20)

## 2014-04-18 MED ORDER — SODIUM CHLORIDE 0.9 % IJ SOLN
10.0000 mL | INTRAMUSCULAR | Status: DC | PRN
  Administered 2014-04-18: 10 mL
  Filled 2014-04-18: qty 10

## 2014-04-18 MED ORDER — SODIUM CHLORIDE 0.9 % IV SOLN
7.5000 mg/kg | Freq: Once | INTRAVENOUS | Status: AC
  Administered 2014-04-18: 650 mg via INTRAVENOUS
  Filled 2014-04-18: qty 26

## 2014-04-18 MED ORDER — HEPARIN SOD (PORK) LOCK FLUSH 100 UNIT/ML IV SOLN
500.0000 [IU] | Freq: Once | INTRAVENOUS | Status: AC | PRN
  Administered 2014-04-18: 500 [IU]
  Filled 2014-04-18: qty 5

## 2014-04-18 MED ORDER — SODIUM CHLORIDE 0.9 % IV SOLN
Freq: Once | INTRAVENOUS | Status: AC
  Administered 2014-04-18: 15:00:00 via INTRAVENOUS

## 2014-04-18 NOTE — Progress Notes (Signed)
78 year old female diagnosed with metastatic colon cancer.  She is a patient of Dr. Benay Spice.  Past medical history includes hypertension, diabetes, anemia, depression, GERD, hypercholesterolemia.  Medications include Lipitor, vitamin D, Lasix, Glucotrol, and Xeloda.  Labs include glucose 185, and albumin 2.9.  Height: 61 inches. Weight: 189.6 pounds. Usual body weight: 200 pounds. BMI: 35.84.  Patient reports anorexia.  She denies problems with nausea and vomiting.  States she sometimes only eats one meal a day.  She has been feeling weak.  Nutrition diagnosis: Food and nutrition related knowledge deficit related to diagnosis of metastatic colon cancer as evidenced by no prior need for nutrition related information.  Intervention: Patient educated to consume a healthy, plant-based diet with adequate calories and protein to minimize weight loss.  Encouraged patient to eat regular meals with snacks in between.  Questions were answered.  Teach back method used.  Patient provided with fact sheets and coupons.  Monitoring, evaluation, goals: Patient will tolerate adequate calories and protein to minimize weight loss throughout treatment.  Next visit: No followup is scheduled.  Patient has my contact information for questions or concerns.

## 2014-04-18 NOTE — Progress Notes (Signed)
[facility] OFFICE PROGRESS NOTE   Diagnosis:  Metastatic colon cancer.  INTERVAL HISTORY:   Emily Lawson returns as scheduled. She reports feeling "weak and shaky" earlier today. She ate a banana just prior to having lab work done. She just ate something about her crackers. She is feeling much better. She denies nausea/vomiting. No mouth sores. No diarrhea. She has noted a few bruises on her forearms. No shortness of breath or chest pain.  Objective:  Vital signs in last 24 hours:  Blood pressure 134/55, pulse 67, temperature 97.9 F (36.6 C), temperature source Oral, resp. rate 18, height $RemoveBe'5\' 1"'[id number]$  (1.549 m), weight 189 lb 9.6 oz (86.002 kg), SpO2 100.00%.    HEENT: No thrush or ulcerations. Resp: Lungs clear. Cardio: Regular cardiac rhythm. GI: Abdomen soft and nontender. Vascular: Minimal lower leg edema bilaterally. Neuro: Alert and oriented. Follows commands. Motor strength intact.  Skin: Palms with hyperpigmentation. No erythema or skin breakdown.    Lab Results:  Lab Results  Component Value Date   WBC 6.4 04/18/2014   HGB 10.0* 04/18/2014   HCT 30.4* 04/18/2014   MCV 107.1* 04/18/2014   PLT 193 04/18/2014   NEUTROABS 4.1 04/18/2014    Imaging:  No results found.  Medications: I have reviewed the patient's current medications.  Assessment/Plan: 1. Metastatic colon cancer presenting with synchronous colon primaries in September, 2008, status post a hemicolectomy and liver biopsy 08/03/2007. A CT on 04/20/2010 revealed no evidence metastatic disease. She was maintained on 5-FU/leucovorin per the FOLFOX regimen beginning in April, 2009. Avasta was discontinued from the chemotherapy regimen beginning in November, 2009 due to nephrotic range proteinuria. A CT of the abdomen and pelvis 02/05/2011 revealed evidence of three small liver lesions. An MRI of the abdomen on 03/02/2011 confirmed 3 liver lesions and no additional evidence of metastatic disease. K-RAS  testing on the transverse colon and left colon tumors revealed a wild type genotype.  1A. Staging PET scan 03/21/2011 confirmed 3 hypermetabolic liver lesions and no additional evidence of metastatic disease.  1B. Surgical consultation by Dr. Barry Dienes was obtained and Emily Lawson decided against an attempt at surgical resection/radiofrequency ablation.  1C. Salvage therapy with FOLFOX was initiated 04/13/2011.  1D. A restaging CT 06/28/2011 revealed a significant improvement in the hepatic metastasis.  1E. She completed eight cycles of FOLFOX chemotherapy. Treatment was switched to 5FU-leucovorin as per the FOLFOX regimen beginning with cycle #9 due to an allergic reaction (skin rash) following cycle #8.  74F. Restaging CT 10/25/2011 revealed 2 more conspicuous liver metastases .  1G. Restaging PET scan 12/24/2011 revealed an increase in the size and metabolic activity of 3 liver metastases  1H. She began FOLFIRI chemotherapy on 01/19/2012.  1 I. Restaging CT of the abdomen 05/08/2012 revealed a mixed response with enlargement of 2 liver lesions and a decrease in the size of a third lesion, no new lesions.  1J. FOLFIRI chemotherapy continued with the addition of Avastin beginning 05/09/2012.  1K. restaging CT 07/28/2012 with no new liver lesions and a decreased size of the previously noted 3 liver lesions.  1L. She was last treated with FOLFIRI/Avastin on 09/12/2012.  49M. She completed stereotactic radiotherapy to 3 liver metastases 10/10/2012 through 10/20/2012.  1N. Follow-up MRI of the abdomen 11/24/2012 showed improvement in the 3 hepatic metastases and no progressive disease.  1O. Restaging MRI 04/09/2013 showed improvement in the 3 treated hepatic lesions and 2 new lesions. No evidence of metastatic disease outside of liver.  1P. Initiation  of Xeloda/Avastin 05/30/2013. Cycle 2 initiated on 06/20/2013 (Avastin placed on hold)-Xeloda discontinued after 10 days secondary to hand/foot syndrome. Cycle 3  Xeloda question initiated on 07/28/2013 (she is unable to recall when she started or stopped cycle 3). Cycle 4 08/20/2013. Cycle 5 09/14/2013 (no Xeloda). Cycle 6 Xeloda/Avastin 10/01/2013. Cycle 7 Xeloda/Avastin 11/02/2013  1Q. restaging MRI of the liver 11/16/2013-3 enhancing right liver lesion, slightly larger. No evidence of metastasis in the left hepatic lobe.  1R. Xeloda/Avastin continued.  1S. restaging PET scan 01/28/2014 showed 2 hypermetabolic lesions in the liver corresponding to the abnormality seen on the recent MRI. The third lesion seen on the recent MRI was not discernible on the PET. No other evidence for hypermetabolic metastases.  Emily Lawson continued  1. Diabetes. 2. History of proteinuria secondary to Avastin versus diabetes. 3. Mucositis and hand foot syndrome, secondary to 5FU improved with a dose reduction of the 5-FU. 4. Anemia secondary to chemotherapy.  5. Left back and left leg pain with an MRI of the lumbar spine confirming lumbar disk disease. She is status post surgery by Dr. Christella Noa 02/14/2008 with resolution of the back and left leg pain. 6. Rosacea. She is followed by dermatology. A recent flare, now maintained on doxycycline 7. Depression, maintained on Cymbalta-she requested a referral to Dr. Ferdinand Lango 8. History of bilateral knee pain secondary to arthritis -- she takes Vicodin as needed. 9. History of anorexia -- weight loss -- ?Related to depression/anxiety or metastatic colon cancer. 10. Allergic reaction to oxaliplatin with FOLFOX on 04/13/2011. She was able to tolerate FOLFOX on 04/20/2011 with steroid premedication and a prolonged oxaliplatin infusion. After cycle #8 she developed a rash upon returning home with pruritus. She took Benadryl with resolution of symptoms. This reaction occurred despite steroid premedication. 11. Poor dentition -- she was evaluated by Dr. Enrique Sack. She underwent tooth extractions 07/04/2013. She now has dentures.   12. Neutropenia following cycle 1 of FOLFIRI. The FOLFIRI was dose reduced beginning with cycle 2. She was neutropenic 03/14/2012 and chemotherapy was held for one week.  13. Nausea and diarrhea during the irinotecan infusion on 03/21/2012. Symptoms resolved with atropine. She receives atropine as a premedication with each cycle. 14. Diarrhea following the 05/09/2012 chemotherapy cycle. Likely related to irinotecan. She takes Imodium as needed. 15. Diffuse pain 4-5 days after chemotherapy 07/17/2012-likely toxicity from Neulasta. 16. History of a fall with fracture of the right fifth metacarpal. 17. Hand/foot syndrome secondary to Xeloda-the Xeloda was dose reduced beginning with cycle 3 on 07/23/2013 (?). She is now taking Xeloda 1 week on/1 week off and the hand/foot symptoms persist 18. Nausea/vomiting/diarrhea starting 03/06/2014-secondary to Xeloda versus Glucophage versus gastroenteritis-resolved.   Disposition: Emily Lawson appears stable. Plan to continue Xeloda 500 mg twice daily 7 days on/7 days off and every 3 week Avastin. She will return for a followup visit in 3 weeks. She will contact the office in the interim with any problems.  The episode of feeling "weak and shaky" earlier today may have been related to hypoglycemia. Symptoms resolved with eating.  Plan reviewed with Dr. Benay Spice.    Owens Shark ANP/GNP-BC   04/18/2014  4:52 PM

## 2014-04-19 ENCOUNTER — Telehealth: Payer: Self-pay | Admitting: Oncology

## 2014-04-19 LAB — CEA: CEA: 12 ng/mL — ABNORMAL HIGH (ref 0.0–5.0)

## 2014-04-19 NOTE — Telephone Encounter (Signed)
s.w. pt and advised on June appt....pt ok adn aware °

## 2014-04-30 ENCOUNTER — Telehealth: Payer: Self-pay | Admitting: Oncology

## 2014-04-30 NOTE — Telephone Encounter (Signed)
per BS moved 6/11 appts to 6/10 due to call day. per desk nurse ok to move tx also. s/w pt she is aware of change and new d/t for 6/10.

## 2014-05-05 ENCOUNTER — Other Ambulatory Visit: Payer: Self-pay | Admitting: Oncology

## 2014-05-08 ENCOUNTER — Telehealth: Payer: Self-pay | Admitting: Oncology

## 2014-05-08 ENCOUNTER — Other Ambulatory Visit (HOSPITAL_BASED_OUTPATIENT_CLINIC_OR_DEPARTMENT_OTHER): Payer: Commercial Managed Care - HMO

## 2014-05-08 ENCOUNTER — Ambulatory Visit (HOSPITAL_BASED_OUTPATIENT_CLINIC_OR_DEPARTMENT_OTHER): Payer: Commercial Managed Care - HMO | Admitting: Oncology

## 2014-05-08 ENCOUNTER — Ambulatory Visit: Payer: Commercial Managed Care - HMO

## 2014-05-08 ENCOUNTER — Telehealth: Payer: Self-pay | Admitting: *Deleted

## 2014-05-08 ENCOUNTER — Ambulatory Visit (HOSPITAL_BASED_OUTPATIENT_CLINIC_OR_DEPARTMENT_OTHER): Payer: Commercial Managed Care - HMO

## 2014-05-08 VITALS — BP 152/54 | HR 67 | Resp 18

## 2014-05-08 VITALS — BP 148/53 | HR 71 | Temp 98.0°F | Resp 18 | Ht 61.0 in | Wt 192.4 lb

## 2014-05-08 DIAGNOSIS — Z5112 Encounter for antineoplastic immunotherapy: Secondary | ICD-10-CM | POA: Diagnosis not present

## 2014-05-08 DIAGNOSIS — C787 Secondary malignant neoplasm of liver and intrahepatic bile duct: Secondary | ICD-10-CM

## 2014-05-08 DIAGNOSIS — F329 Major depressive disorder, single episode, unspecified: Secondary | ICD-10-CM

## 2014-05-08 DIAGNOSIS — K121 Other forms of stomatitis: Secondary | ICD-10-CM

## 2014-05-08 DIAGNOSIS — K1230 Oral mucositis (ulcerative), unspecified: Secondary | ICD-10-CM

## 2014-05-08 DIAGNOSIS — C189 Malignant neoplasm of colon, unspecified: Secondary | ICD-10-CM

## 2014-05-08 DIAGNOSIS — L270 Generalized skin eruption due to drugs and medicaments taken internally: Secondary | ICD-10-CM

## 2014-05-08 DIAGNOSIS — E119 Type 2 diabetes mellitus without complications: Secondary | ICD-10-CM

## 2014-05-08 DIAGNOSIS — R630 Anorexia: Secondary | ICD-10-CM

## 2014-05-08 DIAGNOSIS — Z95828 Presence of other vascular implants and grafts: Secondary | ICD-10-CM

## 2014-05-08 DIAGNOSIS — F3289 Other specified depressive episodes: Secondary | ICD-10-CM

## 2014-05-08 DIAGNOSIS — R609 Edema, unspecified: Secondary | ICD-10-CM

## 2014-05-08 DIAGNOSIS — C187 Malignant neoplasm of sigmoid colon: Secondary | ICD-10-CM

## 2014-05-08 LAB — CBC WITH DIFFERENTIAL/PLATELET
BASO%: 1.1 % (ref 0.0–2.0)
Basophils Absolute: 0.1 10*3/uL (ref 0.0–0.1)
EOS ABS: 0.5 10*3/uL (ref 0.0–0.5)
EOS%: 7.3 % — ABNORMAL HIGH (ref 0.0–7.0)
HCT: 29.9 % — ABNORMAL LOW (ref 34.8–46.6)
HGB: 9.8 g/dL — ABNORMAL LOW (ref 11.6–15.9)
LYMPH#: 1.3 10*3/uL (ref 0.9–3.3)
LYMPH%: 20.6 % (ref 14.0–49.7)
MCH: 35.2 pg — ABNORMAL HIGH (ref 25.1–34.0)
MCHC: 32.8 g/dL (ref 31.5–36.0)
MCV: 107.3 fL — ABNORMAL HIGH (ref 79.5–101.0)
MONO#: 0.5 10*3/uL (ref 0.1–0.9)
MONO%: 7.6 % (ref 0.0–14.0)
NEUT%: 63.4 % (ref 38.4–76.8)
NEUTROS ABS: 4 10*3/uL (ref 1.5–6.5)
PLATELETS: 190 10*3/uL (ref 145–400)
RBC: 2.78 10*6/uL — ABNORMAL LOW (ref 3.70–5.45)
RDW: 21.1 % — AB (ref 11.2–14.5)
WBC: 6.3 10*3/uL (ref 3.9–10.3)

## 2014-05-08 LAB — COMPREHENSIVE METABOLIC PANEL (CC13)
ALT: 17 U/L (ref 0–55)
AST: 28 U/L (ref 5–34)
Albumin: 2.8 g/dL — ABNORMAL LOW (ref 3.5–5.0)
Alkaline Phosphatase: 163 U/L — ABNORMAL HIGH (ref 40–150)
Anion Gap: 16 mEq/L — ABNORMAL HIGH (ref 3–11)
BUN: 23.1 mg/dL (ref 7.0–26.0)
CO2: 21 meq/L — AB (ref 22–29)
Calcium: 8.9 mg/dL (ref 8.4–10.4)
Chloride: 104 mEq/L (ref 98–109)
Creatinine: 1.2 mg/dL — ABNORMAL HIGH (ref 0.6–1.1)
Glucose: 198 mg/dl — ABNORMAL HIGH (ref 70–140)
Potassium: 3.7 mEq/L (ref 3.5–5.1)
SODIUM: 140 meq/L (ref 136–145)
TOTAL PROTEIN: 6.1 g/dL — AB (ref 6.4–8.3)
Total Bilirubin: 2.2 mg/dL — ABNORMAL HIGH (ref 0.20–1.20)

## 2014-05-08 MED ORDER — SODIUM CHLORIDE 0.9 % IV SOLN
7.5000 mg/kg | Freq: Once | INTRAVENOUS | Status: AC
  Administered 2014-05-08: 650 mg via INTRAVENOUS
  Filled 2014-05-08: qty 26

## 2014-05-08 MED ORDER — HEPARIN SOD (PORK) LOCK FLUSH 100 UNIT/ML IV SOLN
500.0000 [IU] | Freq: Once | INTRAVENOUS | Status: AC | PRN
  Administered 2014-05-08: 500 [IU]
  Filled 2014-05-08: qty 5

## 2014-05-08 MED ORDER — SODIUM CHLORIDE 0.9 % IJ SOLN
10.0000 mL | INTRAMUSCULAR | Status: DC | PRN
  Filled 2014-05-08: qty 10

## 2014-05-08 MED ORDER — SODIUM CHLORIDE 0.9 % IJ SOLN
10.0000 mL | INTRAMUSCULAR | Status: DC | PRN
  Administered 2014-05-08: 10 mL
  Filled 2014-05-08: qty 10

## 2014-05-08 MED ORDER — SODIUM CHLORIDE 0.9 % IV SOLN
Freq: Once | INTRAVENOUS | Status: AC
  Administered 2014-05-08: 14:00:00 via INTRAVENOUS

## 2014-05-08 MED ORDER — HEPARIN SOD (PORK) LOCK FLUSH 100 UNIT/ML IV SOLN
500.0000 [IU] | Freq: Once | INTRAVENOUS | Status: AC
  Administered 2014-05-08: 500 [IU] via INTRAVENOUS
  Filled 2014-05-08: qty 5

## 2014-05-08 NOTE — Telephone Encounter (Signed)
Per staff phone call and POF I have schedueld appts.  JMW  

## 2014-05-08 NOTE — Progress Notes (Signed)
Dr. Benay Spice notified that pt unable to produce urine specimen for Uprotein. Last two urine protein checks resulted in 100. Per MD, not to bolus and OK to treat with Avastin without urine today. Pharmacy notified.

## 2014-05-08 NOTE — Progress Notes (Signed)
[facility] OFFICE PROGRESS NOTE   Diagnosis: Colon cancer  INTERVAL HISTORY:   She returns as scheduled. She completed on treatment with Avastin 04/18/2014. No bleeding or symptom of thrombosis. She continues every other week Xeloda. No diarrhea or hand/foot pain. She reports anorexia. She had a fall at home. She developed a bruise at the left shoulder.  Objective:  Vital signs in last 24 hours:  Blood pressure 148/53, pulse 71, temperature 98 F (36.7 C), temperature source Oral, resp. rate 18, height _0  (1.549 m), weight 192 lb 6.4 oz (87.272 kg).    HEENT: No thrush or ulcers Resp: Lungs clear bilaterally Cardio: Regular rate and rhythm, 2/6 systolic murmur GI: No hepatomegaly, nontender Vascular: 1+ pitting edema below the knee bilaterally.  Skin: Resolving ecchymoses at the left shoulder and right upper , skin thickening and hyperpigmentation at the palms and soles. Dry desquamation over the soles.  Portacath/PICC-without erythema  Lab Results:  Lab Results  Component Value Date   WBC 6.3 05/08/2014   HGB 9.8* 05/08/2014   HCT 29.9* 05/08/2014   MCV 107.3* 05/08/2014   PLT 190 05/08/2014   NEUTROABS 4.0 05/08/2014    Lab Results  Component Value Date   CEA 12.0* 04/18/2014    Medications: I have reviewed the patient's current medications.  Assessment/Plan: 1. Metastatic colon cancer presenting with synchronous colon primaries in September, 2008, status post a hemicolectomy and liver biopsy 08/03/2007. A CT on 04/20/2010 revealed no evidence metastatic disease. She was maintained on 5-FU/leucovorin per the FOLFOX regimen beginning in April, 2009. Avasta was discontinued from the chemotherapy regimen beginning in November, 2009 due to nephrotic range proteinuria. A CT of the abdomen and pelvis 02/05/2011 revealed evidence of three small liver lesions. An MRI of the abdomen on 03/02/2011 confirmed 3 liver lesions and no additional evidence of metastatic  disease. K-RAS testing on the transverse colon and left colon tumors revealed a wild type genotype.  1A. Staging PET scan 03/21/2011 confirmed 3 hypermetabolic liver lesions and no additional evidence of metastatic disease.  1B. Surgical consultation by Dr. Barry Dienes was obtained and Emily Lawson decided against an attempt at surgical resection/radiofrequency ablation.  1C. Salvage therapy with FOLFOX was initiated 04/13/2011.  1D. A restaging CT 06/28/2011 revealed a significant improvement in the hepatic metastasis.  1E. She completed eight cycles of FOLFOX chemotherapy. Treatment was switched to 5FU-leucovorin as per the FOLFOX regimen beginning with cycle #9 due to an allergic reaction (skin rash) following cycle #8.  75F. Restaging CT 10/25/2011 revealed 2 more conspicuous liver metastases .  1G. Restaging PET scan 12/24/2011 revealed an increase in the size and metabolic activity of 3 liver metastases  1H. She began FOLFIRI chemotherapy on 01/19/2012.  1 I. Restaging CT of the abdomen 05/08/2012 revealed a mixed response with enlargement of 2 liver lesions and a decrease in the size of a third lesion, no new lesions.  1J. FOLFIRI chemotherapy continued with the addition of Avastin beginning 05/09/2012.  1K. restaging CT 07/28/2012 with no new liver lesions and a decreased size of the previously noted 3 liver lesions.  1L. She was last treated with FOLFIRI/Avastin on 09/12/2012.  41M. She completed stereotactic radiotherapy to 3 liver metastases 10/10/2012 through 10/20/2012.  1N. Follow-up MRI of the abdomen 11/24/2012 showed improvement in the 3 hepatic metastases and no progressive disease.  1O. Restaging MRI 04/09/2013 showed improvement in the 3 treated hepatic lesions and 2 new lesions. No evidence of metastatic disease outside of liver.  1P. Initiation of Xeloda/Avastin 05/30/2013. Cycle 2 initiated on 06/20/2013 (Avastin placed on hold)-Xeloda discontinued after 10 days secondary to hand/foot  syndrome. Cycle 3 Xeloda question initiated on 07/28/2013 (she is unable to recall when she started or stopped cycle 3). Cycle 4 08/20/2013. Cycle 5 09/14/2013 (no Xeloda). Cycle 6 Xeloda/Avastin 10/01/2013. Cycle 7 Xeloda/Avastin 11/02/2013  1Q. restaging MRI of the liver 11/16/2013-3 enhancing right liver lesion, slightly larger. No evidence of metastasis in the left hepatic lobe.  1R. Xeloda/Avastin continued.  1S. restaging PET scan 01/28/2014 showed 2 hypermetabolic lesions in the liver corresponding to the abnormality seen on the recent MRI. The third lesion seen on the recent MRI was not discernible on the PET. No other evidence for hypermetabolic metastases.  Emily Lawson continued  1. Diabetes. 2. History of proteinuria secondary to Avastin versus diabetes. 3. Mucositis and hand foot syndrome, secondary to 5FU improved with a dose reduction of the 5-FU. 4. Anemia secondary to chemotherapy.  5. Left back and left leg pain with an MRI of the lumbar spine confirming lumbar disk disease. She is status post surgery by Dr. Christella Noa 02/14/2008 with resolution of the back and left leg pain. 6. Rosacea. She is followed by dermatology. A recent flare, now maintained on doxycycline 7. Depression, maintained on Cymbalta-she requested a referral to Dr. Ferdinand Lango 8. History of bilateral knee pain secondary to arthritis -- she takes Vicodin as needed. 9. History of anorexia -- weight loss -- ?Related to depression/anxiety or metastatic colon cancer. 10. Allergic reaction to oxaliplatin with FOLFOX on 04/13/2011. She was able to tolerate FOLFOX on 04/20/2011 with steroid premedication and a prolonged oxaliplatin infusion. After cycle #8 she developed a rash upon returning home with pruritus. She took Benadryl with resolution of symptoms. This reaction occurred despite steroid premedication. 11. Poor dentition -- she was evaluated by Dr. Enrique Sack. She underwent tooth extractions 07/04/2013. She now has  dentures.  12. Neutropenia following cycle 1 of FOLFIRI. The FOLFIRI was dose reduced beginning with cycle 2. She was neutropenic 03/14/2012 and chemotherapy was held for one week.  13. Nausea and diarrhea during the irinotecan infusion on 03/21/2012. Symptoms resolved with atropine. She receives atropine as a premedication with each cycle. 14. Diarrhea following the 05/09/2012 chemotherapy cycle. Likely related to irinotecan. She takes Imodium as needed. 15. Diffuse pain 4-5 days after chemotherapy 07/17/2012-likely toxicity from Neulasta. 16. History of a fall with fracture of the right fifth metacarpal. 17. Hand/foot syndrome secondary to Xeloda-the Xeloda was dose reduced beginning with cycle 3 on 07/23/2013 (?). She is now taking Xeloda 1 week on/1 week off at a reduced dose.  Disposition:  Emily Lawson reports anorexia. Her weight is stable, but she has significant bilateral leg edema. The CEA was higher last month. The plan is to continue every three-week Avastin and Xeloda (7 days on/7 days off) . She will return for an office visit in 3 weeks. We will schedule a restaging CT evaluation if her performance status continues to decline. Betsy Coder, MD  05/08/2014  12:12 PM

## 2014-05-08 NOTE — Patient Instructions (Signed)
[facility] Discharge Instructions for Patients Receiving Chemotherapy  Today you received the following chemotherapy agent: Avastin   To help prevent nausea and vomiting after your treatment, we encourage you to take your nausea medication as prescribed.    If you develop nausea and vomiting that is not controlled by your nausea medication, call the clinic.   BELOW ARE SYMPTOMS THAT SHOULD BE REPORTED IMMEDIATELY:  *FEVER GREATER THAN 100.5 F  *CHILLS WITH OR WITHOUT FEVER  NAUSEA AND VOMITING THAT IS NOT CONTROLLED WITH YOUR NAUSEA MEDICATION  *UNUSUAL SHORTNESS OF BREATH  *UNUSUAL BRUISING OR BLEEDING  TENDERNESS IN MOUTH AND THROAT WITH OR WITHOUT PRESENCE OF ULCERS  *URINARY PROBLEMS  *BOWEL PROBLEMS  UNUSUAL RASH Items with * indicate a potential emergency and should be followed up as soon as possible.  Feel free to call the clinic you have any questions or concerns. The clinic phone number is [phone].    

## 2014-05-08 NOTE — Patient Instructions (Signed)

## 2014-05-08 NOTE — Telephone Encounter (Signed)
, °

## 2014-05-09 ENCOUNTER — Encounter: Payer: Self-pay | Admitting: Nutrition

## 2014-05-09 ENCOUNTER — Ambulatory Visit: Payer: Self-pay

## 2014-05-09 ENCOUNTER — Ambulatory Visit: Payer: Self-pay | Admitting: Oncology

## 2014-05-09 ENCOUNTER — Other Ambulatory Visit: Payer: Self-pay

## 2014-05-09 LAB — CEA: CEA: 15 ng/mL — ABNORMAL HIGH (ref 0.0–5.0)

## 2014-05-26 ENCOUNTER — Other Ambulatory Visit: Payer: Self-pay | Admitting: Oncology

## 2014-05-30 ENCOUNTER — Ambulatory Visit (HOSPITAL_BASED_OUTPATIENT_CLINIC_OR_DEPARTMENT_OTHER): Payer: Commercial Managed Care - HMO

## 2014-05-30 ENCOUNTER — Other Ambulatory Visit (HOSPITAL_BASED_OUTPATIENT_CLINIC_OR_DEPARTMENT_OTHER): Payer: Commercial Managed Care - HMO

## 2014-05-30 ENCOUNTER — Ambulatory Visit (HOSPITAL_BASED_OUTPATIENT_CLINIC_OR_DEPARTMENT_OTHER): Payer: Commercial Managed Care - HMO | Admitting: Nurse Practitioner

## 2014-05-30 VITALS — BP 153/57 | HR 68

## 2014-05-30 VITALS — BP 145/56 | HR 70 | Temp 98.0°F | Resp 20 | Ht 61.0 in | Wt 191.5 lb

## 2014-05-30 DIAGNOSIS — C787 Secondary malignant neoplasm of liver and intrahepatic bile duct: Secondary | ICD-10-CM

## 2014-05-30 DIAGNOSIS — C189 Malignant neoplasm of colon, unspecified: Secondary | ICD-10-CM

## 2014-05-30 DIAGNOSIS — Z5112 Encounter for antineoplastic immunotherapy: Secondary | ICD-10-CM

## 2014-05-30 LAB — CBC WITH DIFFERENTIAL/PLATELET
BASO%: 0.5 % (ref 0.0–2.0)
BASOS ABS: 0 10*3/uL (ref 0.0–0.1)
EOS ABS: 0.4 10*3/uL (ref 0.0–0.5)
EOS%: 6.9 % (ref 0.0–7.0)
HCT: 31.3 % — ABNORMAL LOW (ref 34.8–46.6)
HEMOGLOBIN: 10.2 g/dL — AB (ref 11.6–15.9)
LYMPH#: 1.6 10*3/uL (ref 0.9–3.3)
LYMPH%: 26.7 % (ref 14.0–49.7)
MCH: 33.8 pg (ref 25.1–34.0)
MCHC: 32.6 g/dL (ref 31.5–36.0)
MCV: 103.6 fL — ABNORMAL HIGH (ref 79.5–101.0)
MONO#: 0.4 10*3/uL (ref 0.1–0.9)
MONO%: 6.4 % (ref 0.0–14.0)
NEUT%: 59.5 % (ref 38.4–76.8)
NEUTROS ABS: 3.5 10*3/uL (ref 1.5–6.5)
Platelets: 210 10*3/uL (ref 145–400)
RBC: 3.02 10*6/uL — ABNORMAL LOW (ref 3.70–5.45)
RDW: 19 % — AB (ref 11.2–14.5)
WBC: 5.9 10*3/uL (ref 3.9–10.3)
nRBC: 0 % (ref 0–0)

## 2014-05-30 LAB — COMPREHENSIVE METABOLIC PANEL (CC13)
ALBUMIN: 2.9 g/dL — AB (ref 3.5–5.0)
ALT: 16 U/L (ref 0–55)
AST: 26 U/L (ref 5–34)
Alkaline Phosphatase: 150 U/L (ref 40–150)
Anion Gap: 17 mEq/L — ABNORMAL HIGH (ref 3–11)
BUN: 24.8 mg/dL (ref 7.0–26.0)
CO2: 20 meq/L — AB (ref 22–29)
Calcium: 9.6 mg/dL (ref 8.4–10.4)
Chloride: 106 mEq/L (ref 98–109)
Creatinine: 1.2 mg/dL — ABNORMAL HIGH (ref 0.6–1.1)
Glucose: 169 mg/dl — ABNORMAL HIGH (ref 70–140)
POTASSIUM: 3.6 meq/L (ref 3.5–5.1)
SODIUM: 143 meq/L (ref 136–145)
TOTAL PROTEIN: 6.3 g/dL — AB (ref 6.4–8.3)
Total Bilirubin: 1.53 mg/dL — ABNORMAL HIGH (ref 0.20–1.20)

## 2014-05-30 LAB — CEA: CEA: 16.4 ng/mL — AB (ref 0.0–5.0)

## 2014-05-30 LAB — UA PROTEIN, DIPSTICK - CHCC: Protein, ur: 30 mg/dL

## 2014-05-30 MED ORDER — SODIUM CHLORIDE 0.9 % IV SOLN
Freq: Once | INTRAVENOUS | Status: AC
  Administered 2014-05-30: 11:00:00 via INTRAVENOUS

## 2014-05-30 MED ORDER — SODIUM CHLORIDE 0.9 % IJ SOLN
10.0000 mL | INTRAMUSCULAR | Status: DC | PRN
  Administered 2014-05-30: 10 mL
  Filled 2014-05-30: qty 10

## 2014-05-30 MED ORDER — SODIUM CHLORIDE 0.9 % IV SOLN
7.5000 mg/kg | Freq: Once | INTRAVENOUS | Status: AC
  Administered 2014-05-30: 650 mg via INTRAVENOUS
  Filled 2014-05-30: qty 26

## 2014-05-30 MED ORDER — HEPARIN SOD (PORK) LOCK FLUSH 100 UNIT/ML IV SOLN
500.0000 [IU] | Freq: Once | INTRAVENOUS | Status: AC | PRN
  Administered 2014-05-30: 500 [IU]
  Filled 2014-05-30: qty 5

## 2014-05-30 NOTE — Patient Instructions (Signed)
[facility] Discharge Instructions for Patients Receiving Chemotherapy  Today you received the following chemotherapy agents: Avastin  To help prevent nausea and vomiting after your treatment, we encourage you to take your nausea medication as prescribed by your physician.   If you develop nausea and vomiting that is not controlled by your nausea medication, call the clinic.   BELOW ARE SYMPTOMS THAT SHOULD BE REPORTED IMMEDIATELY:  *FEVER GREATER THAN 100.5 F  *CHILLS WITH OR WITHOUT FEVER  NAUSEA AND VOMITING THAT IS NOT CONTROLLED WITH YOUR NAUSEA MEDICATION  *UNUSUAL SHORTNESS OF BREATH  *UNUSUAL BRUISING OR BLEEDING  TENDERNESS IN MOUTH AND THROAT WITH OR WITHOUT PRESENCE OF ULCERS  *URINARY PROBLEMS  *BOWEL PROBLEMS  UNUSUAL RASH Items with * indicate a potential emergency and should be followed up as soon as possible.  Feel free to call the clinic you have any questions or concerns. The clinic phone number is [phone].    

## 2014-05-30 NOTE — Progress Notes (Signed)
[facility] OFFICE PROGRESS NOTE   Diagnosis:  Colon cancer.  INTERVAL HISTORY:   Emily Lawson returns as scheduled. She was last treated with Avastin on 05/08/2014. She continues every other week Xeloda. She continues to note leg swelling but thinks it is some better. Appetite is poor. She estimates eating 2 meals a day. She denies any bleeding. She has dyspnea on exertion. She mainly notes this when getting out of the bathtub. No fever or cough. She had some loose stools yesterday. No consistent diarrhea. She denies nausea/vomiting. No hand or foot pain or redness. She has noted "swelling" at the low anterior neck region recently.  Objective:  Vital signs in last 24 hours:  Blood pressure 145/56, pulse 70, temperature 98 F (36.7 C), temperature source Oral, resp. rate 20, height 5' 1"  (1.549 m), weight 191 lb 8 oz (86.864 kg).   She requested to be examined in the wheelchair as she did not feel she could safely maneuver onto the exam table. HEENT: No thrush or ulcerations. Lymphatics: No palpable cervical, supraclavicular or axillary lymph nodes. Resp: Lungs clear. Cardio: Regular cardiac rhythm. GI: Abdomen soft. Tender right upper mid abdomen. No hepatomegaly. Vascular: Pitting edema below the knees bilaterally. Skin: Palms with hyperpigmentation. No erythema or skin breakdown. MSK: Asymmetric fullness at the head of the left clavicle/sternoclavicular notch.   Lab Results:  Lab Results  Component Value Date   WBC 5.9 05/30/2014   HGB 10.2* 05/30/2014   HCT 31.3* 05/30/2014   MCV 103.6* 05/30/2014   PLT 210 05/30/2014   NEUTROABS 3.5 05/30/2014    Imaging:  No results found.  Medications: I have reviewed the patient's current medications.  Assessment/Plan: 1. Metastatic colon cancer presenting with synchronous colon primaries in September, 2008, status post a hemicolectomy and liver biopsy 08/03/2007. A CT on 04/20/2010 revealed no evidence metastatic disease. She  was maintained on 5-FU/leucovorin per the FOLFOX regimen beginning in April, 2009. Avasta was discontinued from the chemotherapy regimen beginning in November, 2009 due to nephrotic range proteinuria. A CT of the abdomen and pelvis 02/05/2011 revealed evidence of three small liver lesions. An MRI of the abdomen on 03/02/2011 confirmed 3 liver lesions and no additional evidence of metastatic disease. K-RAS testing on the transverse colon and left colon tumors revealed a wild type genotype.  1A. Staging PET scan 03/21/2011 confirmed 3 hypermetabolic liver lesions and no additional evidence of metastatic disease.  1B. Surgical consultation by Dr. Barry Dienes was obtained and Emily Lawson decided against an attempt at surgical resection/radiofrequency ablation.  1C. Salvage therapy with FOLFOX was initiated 04/13/2011.  1D. A restaging CT 06/28/2011 revealed a significant improvement in the hepatic metastasis.  1E. She completed eight cycles of FOLFOX chemotherapy. Treatment was switched to 5FU-leucovorin as per the FOLFOX regimen beginning with cycle #9 due to an allergic reaction (skin rash) following cycle #8.  75F. Restaging CT 10/25/2011 revealed 2 more conspicuous liver metastases .  1G. Restaging PET scan 12/24/2011 revealed an increase in the size and metabolic activity of 3 liver metastases  1H. She began FOLFIRI chemotherapy on 01/19/2012.  1 I. Restaging CT of the abdomen 05/08/2012 revealed a mixed response with enlargement of 2 liver lesions and a decrease in the size of a third lesion, no new lesions.  1J. FOLFIRI chemotherapy continued with the addition of Avastin beginning 05/09/2012.  1K. restaging CT 07/28/2012 with no new liver lesions and a decreased size of the previously noted 3 liver lesions.  1L. She was  last treated with FOLFIRI/Avastin on 09/12/2012.  60M. She completed stereotactic radiotherapy to 3 liver metastases 10/10/2012 through 10/20/2012.  1N. Follow-up MRI of the abdomen  11/24/2012 showed improvement in the 3 hepatic metastases and no progressive disease.  1O. Restaging MRI 04/09/2013 showed improvement in the 3 treated hepatic lesions and 2 new lesions. No evidence of metastatic disease outside of liver.  1P. Initiation of Xeloda/Avastin 05/30/2013. Cycle 2 initiated on 06/20/2013 (Avastin placed on hold)-Xeloda discontinued after 10 days secondary to hand/foot syndrome. Cycle 3 Xeloda question initiated on 07/28/2013 (she is unable to recall when she started or stopped cycle 3). Cycle 4 08/20/2013. Cycle 5 09/14/2013 (no Xeloda). Cycle 6 Xeloda/Avastin 10/01/2013. Cycle 7 Xeloda/Avastin 11/02/2013  1Q. restaging MRI of the liver 11/16/2013-3 enhancing right liver lesion, slightly larger. No evidence of metastasis in the left hepatic lobe.  1R. Xeloda/Avastin continued.  1S. restaging PET scan 01/28/2014 showed 2 hypermetabolic lesions in the liver corresponding to the abnormality seen on the recent MRI. The third lesion seen on the recent MRI was not discernible on the PET. No other evidence for hypermetabolic metastases.  Emily Lawson continued.  1. Diabetes. 2. History of proteinuria secondary to Avastin versus diabetes. 3. Mucositis and hand foot syndrome, secondary to 5FU improved with a dose reduction of the 5-FU. 4. Anemia secondary to chemotherapy.  5. Left back and left leg pain with an MRI of the lumbar spine confirming lumbar disk disease. She is status post surgery by Dr. Christella Noa 02/14/2008 with resolution of the back and left leg pain. 6. Rosacea. She is followed by dermatology. A recent flare, now maintained on doxycycline 7. Depression, maintained on Cymbalta-she requested a referral to Dr. Ferdinand Lango 8. History of bilateral knee pain secondary to arthritis -- she takes Vicodin as needed. 9. History of anorexia -- weight loss -- ?Related to depression/anxiety or metastatic colon cancer. 10. Allergic reaction to oxaliplatin with FOLFOX on 04/13/2011.  She was able to tolerate FOLFOX on 04/20/2011 with steroid premedication and a prolonged oxaliplatin infusion. After cycle #8 she developed a rash upon returning home with pruritus. She took Benadryl with resolution of symptoms. This reaction occurred despite steroid premedication. 11. Poor dentition -- she was evaluated by Dr. Enrique Sack. She underwent tooth extractions 07/04/2013. She now has dentures.  12. Neutropenia following cycle 1 of FOLFIRI. The FOLFIRI was dose reduced beginning with cycle 2. She was neutropenic 03/14/2012 and chemotherapy was held for one week.  13. Nausea and diarrhea during the irinotecan infusion on 03/21/2012. Symptoms resolved with atropine. She receives atropine as a premedication with each cycle. 14. Diarrhea following the 05/09/2012 chemotherapy cycle. Likely related to irinotecan. She takes Imodium as needed. 15. Diffuse pain 4-5 days after chemotherapy 07/17/2012-likely toxicity from Neulasta. 16. History of a fall with fracture of the right fifth metacarpal. 17. Hand/foot syndrome secondary to Xeloda-the Xeloda was dose reduced beginning with cycle 3 on 07/23/2013 (?). She is now taking Xeloda 1 week on/1 week off at a reduced dose. 18. Asymmetric fullness in the region of the left clavicular head/sternoclavicular notch.   Disposition: Emily Lawson performance status continues to decline. We will followup on the CEA from today. Plan to proceed with Avastin as scheduled. For now she will continue Xeloda 7 days on/7 days off. We are referring her for restaging CT scans prior to her next visit in 3 weeks. She will contact the office prior to her next visit with any problems.  Plan reviewed with Dr. Benay Spice.    Marcello Moores,  Emily Lawson ANP/GNP-BC   05/30/2014  10:16 AM

## 2014-05-31 ENCOUNTER — Telehealth: Payer: Self-pay | Admitting: Oncology

## 2014-05-31 NOTE — Telephone Encounter (Signed)
Talked to pt gave her appt for 06/19/14, she cannot come that day and wants to come on Thursday, emailed Dr Benay Spice for anoher spot m also [employer] regarduing chemo

## 2014-06-03 ENCOUNTER — Telehealth: Payer: Self-pay | Admitting: *Deleted

## 2014-06-03 NOTE — Telephone Encounter (Signed)
Per staff message and POF I have scheduled appts. Advised scheduler of appts. JMW  

## 2014-06-05 ENCOUNTER — Other Ambulatory Visit: Payer: Self-pay | Admitting: *Deleted

## 2014-06-05 DIAGNOSIS — C189 Malignant neoplasm of colon, unspecified: Secondary | ICD-10-CM

## 2014-06-05 MED ORDER — CAPECITABINE 500 MG PO TABS: 500.0000 mg | ORAL_TABLET | Freq: Two times a day (BID) | ORAL | Status: DC

## 2014-06-11 ENCOUNTER — Telehealth: Payer: Self-pay | Admitting: Oncology

## 2014-06-11 NOTE — Telephone Encounter (Signed)
per staff message response from [facility] re pt not being able to come in on 7/22 or 7/20 ok to move everything to 7/29 w.LT. s/w pt she is aware of lab/LT/tx 7/29. pt will kee p ct scheduled for 7/21 and has d/t and instruction for appts 7/21 and 7/29.

## 2014-06-13 ENCOUNTER — Telehealth: Payer: Self-pay | Admitting: *Deleted

## 2014-06-13 NOTE — Telephone Encounter (Signed)
Daughter left VM that she thinks [place] needs to be seen before the weekend-either today or tomorrow after 1pm. She has developed swelling in her abdomen.

## 2014-06-14 ENCOUNTER — Emergency Department (HOSPITAL_COMMUNITY): Payer: Medicare PPO

## 2014-06-14 ENCOUNTER — Encounter (HOSPITAL_COMMUNITY): Payer: Self-pay | Admitting: Emergency Medicine

## 2014-06-14 ENCOUNTER — Emergency Department (HOSPITAL_COMMUNITY)
Admission: EM | Admit: 2014-06-14 | Discharge: 2014-06-15 | Disposition: A | Discharge status: Home / Self Care | Payer: Medicare PPO | Attending: Emergency Medicine | Admitting: Emergency Medicine

## 2014-06-14 ENCOUNTER — Telehealth: Payer: Self-pay | Admitting: *Deleted

## 2014-06-14 DIAGNOSIS — Z872 Personal history of diseases of the skin and subcutaneous tissue: Secondary | ICD-10-CM | POA: Diagnosis not present

## 2014-06-14 DIAGNOSIS — Z862 Personal history of diseases of the blood and blood-forming organs and certain disorders involving the immune mechanism: Secondary | ICD-10-CM | POA: Insufficient documentation

## 2014-06-14 DIAGNOSIS — R188 Other ascites: Secondary | ICD-10-CM | POA: Diagnosis not present

## 2014-06-14 DIAGNOSIS — M129 Arthropathy, unspecified: Secondary | ICD-10-CM | POA: Insufficient documentation

## 2014-06-14 DIAGNOSIS — F3289 Other specified depressive episodes: Secondary | ICD-10-CM | POA: Diagnosis not present

## 2014-06-14 DIAGNOSIS — R141 Gas pain: Secondary | ICD-10-CM | POA: Diagnosis present

## 2014-06-14 DIAGNOSIS — F329 Major depressive disorder, single episode, unspecified: Secondary | ICD-10-CM | POA: Insufficient documentation

## 2014-06-14 DIAGNOSIS — Z923 Personal history of irradiation: Secondary | ICD-10-CM | POA: Insufficient documentation

## 2014-06-14 DIAGNOSIS — I10 Essential (primary) hypertension: Secondary | ICD-10-CM | POA: Insufficient documentation

## 2014-06-14 DIAGNOSIS — Z79899 Other long term (current) drug therapy: Secondary | ICD-10-CM | POA: Diagnosis not present

## 2014-06-14 DIAGNOSIS — Z8669 Personal history of other diseases of the nervous system and sense organs: Secondary | ICD-10-CM | POA: Diagnosis not present

## 2014-06-14 DIAGNOSIS — Z85038 Personal history of other malignant neoplasm of large intestine: Secondary | ICD-10-CM | POA: Diagnosis not present

## 2014-06-14 DIAGNOSIS — F411 Generalized anxiety disorder: Secondary | ICD-10-CM | POA: Insufficient documentation

## 2014-06-14 DIAGNOSIS — Z9089 Acquired absence of other organs: Secondary | ICD-10-CM | POA: Insufficient documentation

## 2014-06-14 DIAGNOSIS — R609 Edema, unspecified: Secondary | ICD-10-CM | POA: Diagnosis not present

## 2014-06-14 DIAGNOSIS — R011 Cardiac murmur, unspecified: Secondary | ICD-10-CM | POA: Diagnosis not present

## 2014-06-14 DIAGNOSIS — Z7982 Long term (current) use of aspirin: Secondary | ICD-10-CM | POA: Diagnosis not present

## 2014-06-14 DIAGNOSIS — I209 Angina pectoris, unspecified: Secondary | ICD-10-CM | POA: Diagnosis not present

## 2014-06-14 DIAGNOSIS — C787 Secondary malignant neoplasm of liver and intrahepatic bile duct: Secondary | ICD-10-CM | POA: Diagnosis not present

## 2014-06-14 DIAGNOSIS — K219 Gastro-esophageal reflux disease without esophagitis: Secondary | ICD-10-CM | POA: Insufficient documentation

## 2014-06-14 DIAGNOSIS — E7800 Pure hypercholesterolemia, unspecified: Secondary | ICD-10-CM | POA: Insufficient documentation

## 2014-06-14 DIAGNOSIS — E119 Type 2 diabetes mellitus without complications: Secondary | ICD-10-CM | POA: Diagnosis not present

## 2014-06-14 DIAGNOSIS — R143 Flatulence: Secondary | ICD-10-CM | POA: Diagnosis present

## 2014-06-14 LAB — URINALYSIS, ROUTINE W REFLEX MICROSCOPIC
Bilirubin Urine: NEGATIVE
Glucose, UA: NEGATIVE mg/dL
Hgb urine dipstick: NEGATIVE
Ketones, ur: NEGATIVE mg/dL
Leukocytes, UA: NEGATIVE
Nitrite: NEGATIVE
Protein, ur: 100 mg/dL — AB
Specific Gravity, Urine: 1.013 (ref 1.005–1.030)
Urobilinogen, UA: 0.2 mg/dL (ref 0.0–1.0)
pH: 5 (ref 5.0–8.0)

## 2014-06-14 LAB — CBC WITH DIFFERENTIAL/PLATELET
Basophils Absolute: 0 10*3/uL (ref 0.0–0.1)
Basophils Relative: 0 % (ref 0–1)
Eosinophils Absolute: 0.2 10*3/uL (ref 0.0–0.7)
Eosinophils Relative: 4 % (ref 0–5)
HCT: 33.6 % — ABNORMAL LOW (ref 36.0–46.0)
Hemoglobin: 11.3 g/dL — ABNORMAL LOW (ref 12.0–15.0)
Lymphocytes Relative: 25 % (ref 12–46)
Lymphs Abs: 1.4 10*3/uL (ref 0.7–4.0)
MCH: 34.6 pg — ABNORMAL HIGH (ref 26.0–34.0)
MCHC: 33.6 g/dL (ref 30.0–36.0)
MCV: 102.8 fL — ABNORMAL HIGH (ref 78.0–100.0)
Monocytes Absolute: 0.5 10*3/uL (ref 0.1–1.0)
Monocytes Relative: 9 % (ref 3–12)
Neutro Abs: 3.5 10*3/uL (ref 1.7–7.7)
Neutrophils Relative %: 62 % (ref 43–77)
Platelets: 204 10*3/uL (ref 150–400)
RBC: 3.27 MIL/uL — ABNORMAL LOW (ref 3.87–5.11)
RDW: 19.4 % — ABNORMAL HIGH (ref 11.5–15.5)
WBC: 5.6 10*3/uL (ref 4.0–10.5)

## 2014-06-14 LAB — COMPREHENSIVE METABOLIC PANEL
ALT: 18 U/L (ref 0–35)
AST: 39 U/L — ABNORMAL HIGH (ref 0–37)
Albumin: 3.2 g/dL — ABNORMAL LOW (ref 3.5–5.2)
Alkaline Phosphatase: 233 U/L — ABNORMAL HIGH (ref 39–117)
Anion gap: 20 — ABNORMAL HIGH (ref 5–15)
BUN: 25 mg/dL — ABNORMAL HIGH (ref 6–23)
CO2: 20 mEq/L (ref 19–32)
Calcium: 9.6 mg/dL (ref 8.4–10.5)
Chloride: 99 mEq/L (ref 96–112)
Creatinine, Ser: 1.2 mg/dL — ABNORMAL HIGH (ref 0.50–1.10)
GFR calc Af Amer: 50 mL/min — ABNORMAL LOW (ref >90)
GFR calc non Af Amer: 43 mL/min — ABNORMAL LOW (ref >90)
Glucose, Bld: 165 mg/dL — ABNORMAL HIGH (ref 70–99)
Potassium: 3.7 mEq/L (ref 3.7–5.3)
Sodium: 139 mEq/L (ref 137–147)
Total Bilirubin: 2 mg/dL — ABNORMAL HIGH (ref 0.3–1.2)
Total Protein: 7.3 g/dL (ref 6.0–8.3)

## 2014-06-14 LAB — URINE MICROSCOPIC-ADD ON

## 2014-06-14 MED ORDER — FUROSEMIDE 10 MG/ML IJ SOLN
40.0000 mg | Freq: Once | INTRAMUSCULAR | Status: AC
  Administered 2014-06-14: 40 mg via INTRAVENOUS
  Filled 2014-06-14: qty 4

## 2014-06-14 MED ORDER — IOHEXOL 300 MG/ML  SOLN
100.0000 mL | Freq: Once | INTRAMUSCULAR | Status: AC | PRN
Start: 2014-06-14 — End: 2014-06-14
  Administered 2014-06-14: 100 mL via INTRAVENOUS

## 2014-06-14 MED ORDER — IOHEXOL 300 MG/ML  SOLN
50.0000 mL | Freq: Once | INTRAMUSCULAR | Status: AC | PRN
  Administered 2014-06-14: 50 mL via ORAL

## 2014-06-14 MED ORDER — FUROSEMIDE 20 MG PO TABS: 20.0000 mg | ORAL_TABLET | Freq: Two times a day (BID) | ORAL | Status: AC

## 2014-06-14 MED ORDER — POTASSIUM CHLORIDE CRYS ER 20 MEQ PO TBCR: 20.0000 meq | EXTENDED_RELEASE_TABLET | Freq: Two times a day (BID) | ORAL | Status: DC

## 2014-06-14 NOTE — Telephone Encounter (Signed)
   Provider input needed: Abdominal Swelling;progressive edema;shaking tremors   Reason for call:   Gastrointestinal: positive for constipation, nausea and abdomen very swollen and firm over past 2 weeks. Swelling has progressed from knees into her thighs now. Last BM was 7/16 but it was consistency of hard marbles with some blood noted. Behavioral/Psych: positive for more lethargic and confused Has been having shaking tremors on and off (cold-hot) for last week. Does not have fever. Has not taken her Lasix in about a week. Daughter also notes that they fill her pill box every 14 days at same time and Korinna seems to finish 2 days early. Has not taken her Lasix in a week. She reports her urine looks normal and daughter does not think she looks jaundiced.   ALLERGIES:  is allergic to avandia and oxaliplatin.  Patient last received chemotherapy/ treatment on 05/30/14-Avastin with Xeloda 7 on 7 off  Patient was last seen in the office on 05/30/14   Next appt is 06/26/14 after scan on 7/21   Is patient having fevers greater than 100.5?  no   Is patient having uncontrolled pain, or new pain? no   Is patient having new back pain that changes with position (worsens or eases when laying down?)  no   Is patient able to eat and drink? yes, poor appetite    Is patient able to pass stool without difficulty?   no, last BM "like marbles" with bleeding     Is patient having uncontrolled nausea?  Mild nausea    family calls 06/14/2014 with complaint of  see above   Summary Based on the above information advised family to  To await return call after talking with NP.   Tania Ade  06/14/2014, 9:22 AM   Background Info  Emily Lawson   DOB: [birth year]   MR#: [id number]   CSN#   [id number] 06/14/2014

## 2014-06-14 NOTE — ED Notes (Signed)
md at bedside  Pt alert and oriented x4. Respirations even and unlabored, bilateral symmetrical rise and fall of chest. Skin warm and dry. In no acute distress. Denies needs.   

## 2014-06-14 NOTE — Telephone Encounter (Signed)
Notified sitter (daughter had left) that she needs to go to emergency room. She has too many issues going on that can't be resolved in an office visit. Many potential reasons for her symptoms and needs work up-especially since she is on Avastin. Sitter agrees to take her to emergency room.

## 2014-06-14 NOTE — Telephone Encounter (Signed)
Patient left VM asking what time was her appointment with Dr. Benay Spice on Monday?

## 2014-06-14 NOTE — Telephone Encounter (Signed)
Called back and spoke with patient (did not go to ER). Informed her that she has CT scan on 6/21 and sees Dr. Benay Spice on 6/29. Again strongly encouraged her to go to ER for assessment based on information we were given earlier in regards to her condition. Told her that to delay could impact her life. She asked nurse what did that mean? Responded "you could die". Brought to her attention all the potential problems that could be going on due to her cancer and the Avastin she takes and that she could even be having a stroke-told her that her speech is more slurred than usual. She tells nurse that it is due to her dentures fitting poorly. She does not want to go to ER due to having so much going on at home right now. Spoke with daughter, Donnald Garre also: Says her mom had refused to go to ER, she asked if it would be OK to wait till later-things are hectic at home. They are having a party and she can't take her now. Maybe she will call her brother to take her. Told daughter that we feel it is in her best interest to go to ER for evaluation/admission and that ultimate decision is Reia's and her's, but delay could cause her to become more ill and death is a possibility given her condition. Daughter says she appreciates nurse calling. Call ended.

## 2014-06-14 NOTE — ED Provider Notes (Signed)
CSN: [id number]     Arrival date & time 06/14/14  1741 History   First MD Initiated Contact with Patient 06/14/14 1847     Chief Complaint  Patient presents with  . abdominal swelling   . Bloated     (Consider location/radiation/quality/duration/timing/severity/associated sxs/prior Treatment) HPI  78 year old female presenting with her son for evaluation of increasing lower extremity edema and abdominal bloating. Additionally history provided later by one of her daughters. Symptom onset a couple weeks ago. Progressively worsening. Patient with known metastatic cancer. Colon primary with metastases to her liver. Currently on chemotherapy. Fatigue. She feels bloated, denies any acute abdominal pain. No urinary complaints. CT. Has seemed tremulous over the past couple weeks. No acute neurological complaints otherwise.  Past Medical History  Diagnosis Date  . Metastases to the liver dx'd 2008    chemo ongoing  . Hypertension   . Arthritis   . Heart murmur   . Chills   . Hearing loss   . Leg swelling   . Weakness   . Nausea   . Colon cancer dx'd 2008    hemicolectomy, liver bx 08/03/07  . Diabetes mellitus   . Anemia     2ndary to chemo  . Rosacea     hx of  . Anorexia     hx of  . Anxiety   . Allergy     avandia/oxaliplatin  . Cancer 02/05/11 CT abd/pelvis    colon primary met to liver  . History of radiation therapy 10/10/2012-10/20/2012    3 liver metastases  . Anginal pain   . Hypercholesteremia   . Headache(784.0)     sinus  . Sinus drainage   . Sleep apnea     does not use CPAP often  . Depression   . GERD (gastroesophageal reflux disease)   . Complication of anesthesia     hard to wake up   Past Surgical History  Procedure Laterality Date  . Colon surgery  2008  . Back surgery  2009  . Cholecystectomy  1997  . Inguinal hernia repair      as child  . Tonsillectomy      as child  . Portacath placement Left 2008    chest  . Multiple extractions with  alveoloplasty N/A 07/04/2013    Procedure: extraction of tooth #'s 7,8,9,10,11,12,13,20,21,22,23,25,26,27,28,29,31  with alveoloplasty;  Surgeon: Lenn Cal, DDS;  Location: [facility];  Service: Oral Surgery;  Laterality: N/A;   Family History  Problem Relation Age of Onset  . Cancer Mother     ovarian  . Heart disease Father   . Diabetes Brother    History  Substance Use Topics  . Smoking status: Never Smoker   . Smokeless tobacco: Never Used  . Alcohol Use: Yes     Comment: rare   OB History   Grav Para Term Preterm Abortions TAB SAB Ect Mult Living                 Review of Systems  All systems reviewed and negative, other than as noted in HPI.   Allergies  Avandia and Oxaliplatin  Home Medications   Prior to Admission medications   Medication Sig Start Date End Date Taking? Authorizing Provider  acetaminophen (TYLENOL) 500 MG tablet Take 500 mg by mouth every 6 (six) hours as needed for pain.    Yes Historical Provider, MD  alendronate (FOSAMAX) 70 MG tablet Take 70 mg by mouth every Sunday at 6pm. Will begin 03/14/12  03/01/12  Yes Historical Provider, MD  amLODipine (NORVASC) 5 MG tablet Take 5 mg by mouth every morning.  07/24/12  Yes Historical Provider, MD  aspirin 81 MG tablet Take 81 mg by mouth 2 (two) times daily.    Yes Historical Provider, MD  atorvastatin (LIPITOR) 40 MG tablet Take 40 mg by mouth at bedtime.  03/01/13  Yes Historical Provider, MD  capecitabine (XELODA) 500 MG tablet Take 1 tablet (500 mg total) by mouth 2 (two) times daily after a meal. Take for 7 days on and 7 days off 06/05/14  Yes Ladell Pier, MD  Cholecalciferol (VITAMIN D-3 PO) Take 5,000 Units by mouth daily.    Yes Historical Provider, MD  CYMBALTA 60 MG capsule Take 60 mg by mouth at bedtime.  08/11/11  Yes Historical Provider, MD  donepezil (ARICEPT) 5 MG tablet Take 5 mg by mouth at bedtime.  02/05/14  Yes Historical Provider, MD  furosemide (LASIX) 20 MG tablet Take 20 mg by mouth 2  (two) times daily.   Yes Historical Provider, MD  glucosamine-chondroitin 500-400 MG tablet Take 2 tablets by mouth 2 (two) times daily.    Yes Historical Provider, MD  hydrochlorothiazide (HYDRODIURIL) 25 MG tablet Take 25 mg by mouth every morning.  07/08/11  Yes Historical Provider, MD  HYDROcodone-acetaminophen (HYCET) 7.5-325 mg/15 ml solution Take 15 mLs by mouth every 6 (six) hours as needed for pain. 07/04/13 07/04/14 Yes Lenn Cal, DDS  iron polysaccharides (NIFEREX) 150 MG capsule Take 150 mg by mouth daily.   Yes Historical Provider, MD  lidocaine-prilocaine (EMLA) cream Apply 1 application topically as needed (APPLY TO PAC SITE 1 HOUR PRIOR TO STICK, COVER WITH PLASTIC).   Yes Historical Provider, MD  metFORMIN (GLUCOPHAGE) 1000 MG tablet Take 1,000 mg by mouth 2 (two) times daily with a meal.  06/09/11  Yes Historical Provider, MD  oxybutynin (DITROPAN) 5 MG tablet Take 5 mg by mouth 2 (two) times daily.  04/09/14  Yes Historical Provider, MD  pantoprazole (PROTONIX) 40 MG tablet Take 40 mg by mouth daily.  08/11/11  Yes Historical Provider, MD  prochlorperazine (COMPAZINE) 10 MG tablet Take 1 tablet (10 mg total) by mouth every 6 (six) hours as needed. 03/07/14  Yes Ladell Pier, MD  traMADol (ULTRAM) 50 MG tablet Take 50 mg by mouth every 6 (six) hours as needed for pain. Has not started taking this medication   Yes Historical Provider, MD   BP 129/67  Pulse 66  Temp(Src) 97.9 F (36.6 C) (Oral)  Resp 18  SpO2 99% Physical Exam  Nursing note and vitals reviewed. Constitutional: She appears well-developed and well-nourished. No distress.  HENT:  Head: Normocephalic and atraumatic.  Eyes: Conjunctivae are normal. Right eye exhibits no discharge. Left eye exhibits no discharge.  Neck: Neck supple.  Cardiovascular: Normal rate, regular rhythm and normal heart sounds.  Exam reveals no gallop and no friction rub.   No murmur heard. Pulmonary/Chest: Effort normal and breath sounds  normal. No respiratory distress.  Abdominal: Soft. She exhibits distension. There is no tenderness.  Abdomen does seem distended. It is soft. Nontender.  Musculoskeletal: She exhibits edema. She exhibits no tenderness.  Neurological: She is alert. No cranial nerve deficit. She exhibits normal muscle tone. Coordination normal.  Speech is clear. Content is appropriate. Follows commands. Strength is 5 bilateral upper and lower extremities. Mild resting tremor noted. No asterixis.  Skin: Skin is warm and dry.  Psychiatric: She has a normal mood  and affect. Her behavior is normal. Thought content normal.    ED Course  Procedures (including critical care time) Labs Review Labs Reviewed  COMPREHENSIVE METABOLIC PANEL - Abnormal; Notable for the following:    Glucose, Bld 165 (*)    BUN 25 (*)    Creatinine, Ser 1.20 (*)    Albumin 3.2 (*)    AST 39 (*)    Alkaline Phosphatase 233 (*)    Total Bilirubin 2.0 (*)    GFR calc non Af Amer 43 (*)    GFR calc Af Amer 50 (*)    Anion gap 20 (*)    All other components within normal limits  CBC WITH DIFFERENTIAL - Abnormal; Notable for the following:    RBC 3.27 (*)    Hemoglobin 11.3 (*)    HCT 33.6 (*)    MCV 102.8 (*)    MCH 34.6 (*)    RDW 19.4 (*)    All other components within normal limits  URINALYSIS, ROUTINE W REFLEX MICROSCOPIC - Abnormal; Notable for the following:    APPearance CLOUDY (*)    Protein, ur 100 (*)    All other components within normal limits  URINE MICROSCOPIC-ADD ON - Abnormal; Notable for the following:    Squamous Epithelial / LPF FEW (*)    Casts HYALINE CASTS (*)    All other components within normal limits    Imaging Review Ct Abdomen Pelvis W Contrast  06/14/2014   CLINICAL DATA:  Abdominal pain and distention. Worsening constipation. Currently on chemotherapy for colorectal carcinoma with liver metastases.  EXAM: CT ABDOMEN AND PELVIS WITH CONTRAST  TECHNIQUE: Multidetector CT imaging of the abdomen and  pelvis was performed using the standard protocol following bolus administration of intravenous contrast.  CONTRAST:  86m OMNIPAQUE IOHEXOL 300 MG/ML SOLN, 1040mOMNIPAQUE IOHEXOL 300 MG/ML SOLN  COMPARISON:  MRI of the abdomen performed 11/16/2013, and PET/CT performed 01/28/2014  FINDINGS: The visualized lung bases are clear.  There is new moderate ascites within the abdomen and pelvis. Mild associated soft tissue inflammation is seen tracking along the mesentery.  Large infiltrating lesions are again noted within the right hepatic lobe, difficult to fully assess on CT. There is mildly increased hepatic atrophy with regard to the two larger lesions, while the smaller lobulated lesion has increased mildly in size, measuring 3.5 x 2.5 cm.  The spleen is unremarkable in appearance. The patient is status post cholecystectomy, with clips noted along the gallbladder fossa. The pancreas and adrenal glands are unremarkable.  Scattered left renal cysts are seen, measuring up to 2.8 cm in size. The kidneys are otherwise unremarkable in appearance. There is no evidence of hydronephrosis. No renal or ureteral stones are seen. No perinephric stranding is appreciated.  The small bowel is unremarkable in appearance. The stomach is within normal limits. No acute vascular abnormalities are seen.  The appendix is not well assessed due to surrounding ascites. A bowel suture line is noted along the mobilized descending colon, with resection of much of the descending and sigmoid colon. The anastomosis is partially filled with stool and grossly unremarkable in appearance.  Vague nonspecific haziness is noted within the soft tissues of the abdominal wall. A small to moderate periumbilical hernia is seen, containing a small amount of fluid.  The bladder is mildly distended and grossly unremarkable. The uterus is within normal limits. The ovaries are grossly symmetric; no suspicious adnexal masses are seen. No inguinal lymphadenopathy is  seen.  No acute osseous  abnormalities are identified. Vacuum phenomenon and disc space narrowing noted at L3-L4 and L4-L5.  IMPRESSION: 1. New moderate volume abdominopelvic ascites noted. 2. Large infiltrating lesions again noted within the right hepatic lobe, difficult to fully assess on CT. There is mildly increased hepatic atrophy with regard to the two larger lesions, while the smaller lobulated lesion has increased mildly in size, measuring 3.5 x 2.5 cm. 3. Bowel suture line along the descending and sigmoid colon is grossly unremarkable in appearance. 4. Small to moderate periumbilical hernia, containing a small amount of fluid. 5. Scattered left renal cysts seen.   Electronically Signed   By: Garald Balding M.D.   On: 06/14/2014 23:18     EKG Interpretation None      MDM   Final diagnoses:  Ascites  Peripheral edema    78yF with worsening abdominal distension and peripheral edema. CT with ascites. Pt with known mets to liver. Pt already prescribed lasix to use on PRN basis but hasn't taken consistently. Will place pt on scheduled lasix for at least a few days. Potassium supplementation. No indication for emergent paracentesis. Hopefully will respond to diuresis.  Numerous other complaints. Feels cold all the time, shaky, poor appetite, etc. She is lucid and neuro exam is nonfocal. Afebrile. HD stable. I feel she is appropriate for discharge. Return precautions discussed with pt and daughter.     Virgel Manifold, MD 06/20/14 [phone]

## 2014-06-14 NOTE — ED Notes (Signed)
Patient has completed PO contrast

## 2014-06-14 NOTE — ED Notes (Signed)
Per pt, sent from MD.  Pt has been having shaking for 2 weeks.  Pt is under chemo.  Last tx 2 weeks ago.  Pt has had swelling on/off again.  Pt has noticeable edema to bil feet.

## 2014-06-17 ENCOUNTER — Ambulatory Visit: Payer: Self-pay | Admitting: Nurse Practitioner

## 2014-06-17 ENCOUNTER — Telehealth: Payer: Self-pay | Admitting: *Deleted

## 2014-06-17 NOTE — Telephone Encounter (Signed)
Pt called asking "Do I need to have the scans tomorrow (06/19/14) since I went to the ED Friday and had a scan?"  Pt had CT abdomen Pelvis 06/14/14.  Per Dr. Benay Spice; notified pt that she does not need to have CT Chest or another CT abdomin pelvis per MD.  Confirmed with pt appt 06/26/14 lab/Lisa Thomas/treatment.  Pt verbalized understanding of information and expressed appreciation for call back.

## 2014-06-18 ENCOUNTER — Ambulatory Visit (HOSPITAL_COMMUNITY): Payer: Commercial Managed Care - HMO

## 2014-06-18 ENCOUNTER — Ambulatory Visit (HOSPITAL_COMMUNITY): Payer: Medicare PPO

## 2014-06-19 ENCOUNTER — Ambulatory Visit: Payer: Self-pay

## 2014-06-19 ENCOUNTER — Other Ambulatory Visit: Payer: Self-pay

## 2014-06-19 ENCOUNTER — Ambulatory Visit: Payer: Self-pay | Admitting: Oncology

## 2014-06-24 ENCOUNTER — Telehealth: Payer: Self-pay | Admitting: *Deleted

## 2014-06-24 NOTE — Telephone Encounter (Signed)
Called to confirm what time her appointment on Wednesday was and if she is having chemo. Instructed her to arrive at 10:15 for lab/see NP/chemo if she is doing well.

## 2014-06-26 ENCOUNTER — Ambulatory Visit (HOSPITAL_BASED_OUTPATIENT_CLINIC_OR_DEPARTMENT_OTHER): Payer: Commercial Managed Care - HMO

## 2014-06-26 ENCOUNTER — Ambulatory Visit (HOSPITAL_BASED_OUTPATIENT_CLINIC_OR_DEPARTMENT_OTHER): Payer: Commercial Managed Care - HMO | Admitting: Nurse Practitioner

## 2014-06-26 ENCOUNTER — Telehealth: Payer: Self-pay | Admitting: Oncology

## 2014-06-26 ENCOUNTER — Other Ambulatory Visit (HOSPITAL_BASED_OUTPATIENT_CLINIC_OR_DEPARTMENT_OTHER): Payer: Commercial Managed Care - HMO

## 2014-06-26 ENCOUNTER — Ambulatory Visit: Payer: Medicare HMO

## 2014-06-26 VITALS — BP 136/47 | HR 64 | Temp 97.6°F | Resp 18 | Ht 61.0 in | Wt 184.4 lb

## 2014-06-26 DIAGNOSIS — R188 Other ascites: Secondary | ICD-10-CM

## 2014-06-26 DIAGNOSIS — F3289 Other specified depressive episodes: Secondary | ICD-10-CM

## 2014-06-26 DIAGNOSIS — C189 Malignant neoplasm of colon, unspecified: Secondary | ICD-10-CM

## 2014-06-26 DIAGNOSIS — F329 Major depressive disorder, single episode, unspecified: Secondary | ICD-10-CM

## 2014-06-26 DIAGNOSIS — T451X5A Adverse effect of antineoplastic and immunosuppressive drugs, initial encounter: Secondary | ICD-10-CM

## 2014-06-26 DIAGNOSIS — C787 Secondary malignant neoplasm of liver and intrahepatic bile duct: Secondary | ICD-10-CM

## 2014-06-26 DIAGNOSIS — Z95828 Presence of other vascular implants and grafts: Secondary | ICD-10-CM

## 2014-06-26 DIAGNOSIS — D6481 Anemia due to antineoplastic chemotherapy: Secondary | ICD-10-CM

## 2014-06-26 LAB — COMPREHENSIVE METABOLIC PANEL (CC13)
ALT: 15 U/L (ref 0–55)
ANION GAP: 15 meq/L — AB (ref 3–11)
AST: 35 U/L — ABNORMAL HIGH (ref 5–34)
Albumin: 2.8 g/dL — ABNORMAL LOW (ref 3.5–5.0)
Alkaline Phosphatase: 236 U/L — ABNORMAL HIGH (ref 40–150)
BILIRUBIN TOTAL: 1.95 mg/dL — AB (ref 0.20–1.20)
BUN: 28.5 mg/dL — ABNORMAL HIGH (ref 7.0–26.0)
CHLORIDE: 105 meq/L (ref 98–109)
CO2: 22 meq/L (ref 22–29)
Calcium: 9.3 mg/dL (ref 8.4–10.4)
Creatinine: 1.3 mg/dL — ABNORMAL HIGH (ref 0.6–1.1)
GLUCOSE: 179 mg/dL — AB (ref 70–140)
Potassium: 4 mEq/L (ref 3.5–5.1)
SODIUM: 141 meq/L (ref 136–145)
Total Protein: 6.5 g/dL (ref 6.4–8.3)

## 2014-06-26 LAB — CBC WITH DIFFERENTIAL/PLATELET
BASO%: 0.5 % (ref 0.0–2.0)
Basophils Absolute: 0 10*3/uL (ref 0.0–0.1)
EOS ABS: 0.4 10*3/uL (ref 0.0–0.5)
EOS%: 5.6 % (ref 0.0–7.0)
HEMATOCRIT: 31.3 % — AB (ref 34.8–46.6)
HGB: 10.3 g/dL — ABNORMAL LOW (ref 11.6–15.9)
LYMPH#: 1.6 10*3/uL (ref 0.9–3.3)
LYMPH%: 20.6 % (ref 14.0–49.7)
MCH: 34.6 pg — ABNORMAL HIGH (ref 25.1–34.0)
MCHC: 32.9 g/dL (ref 31.5–36.0)
MCV: 105 fL — ABNORMAL HIGH (ref 79.5–101.0)
MONO#: 0.6 10*3/uL (ref 0.1–0.9)
MONO%: 7.9 % (ref 0.0–14.0)
NEUT%: 65.4 % (ref 38.4–76.8)
NEUTROS ABS: 5.1 10*3/uL (ref 1.5–6.5)
Platelets: 199 10*3/uL (ref 145–400)
RBC: 2.98 10*6/uL — AB (ref 3.70–5.45)
RDW: 19.7 % — ABNORMAL HIGH (ref 11.2–14.5)
WBC: 7.8 10*3/uL (ref 3.9–10.3)

## 2014-06-26 MED ORDER — SODIUM CHLORIDE 0.9 % IJ SOLN
10.0000 mL | INTRAMUSCULAR | Status: DC | PRN
  Administered 2014-06-26: 10 mL via INTRAVENOUS
  Filled 2014-06-26: qty 10

## 2014-06-26 NOTE — Patient Instructions (Signed)

## 2014-06-26 NOTE — Telephone Encounter (Signed)
gva dn rpinted appt sched adn avs for pt for Aug

## 2014-06-26 NOTE — Progress Notes (Addendum)
[facility] OFFICE PROGRESS NOTE   Diagnosis:  Colon cancer.  INTERVAL HISTORY:   Ms. Porco returns as scheduled. She continues Avastin and every other week Xeloda. She was seen in the emergency Department on 06/14/2014 for evaluation of abdominal distention. CT abdomen/pelvis showed new moderate ascites. Liver lesions were overall stable. She was started on scheduled Lasix.  She has noted improvement in the abdominal distention as well as leg swelling. Energy level continues to be poor. Appetite is also poor. Her daughter feels that she is sleeping too much. She has any pain. No mouth sores. No nausea or vomiting. No diarrhea. No hand or foot pain or redness.  Objective:  Vital signs in last 24 hours:  Blood pressure 136/47, pulse 64, temperature 97.6 F (36.4 C), temperature source Oral, resp. rate 18, height 5' 1"  (1.549 m), weight 184 lb 6.4 oz (83.643 kg).    HEENT: No thrush or ulcerations. Resp: Lungs clear. Cardio: Regular cardiac rhythm. GI: Abdomen is soft, distended. Positive ascites. Vascular: 2+ pitting edema below the knees bilaterally. No sacral edema. Skin: Palms with hyperpigmentation. No erythema or skin breakdown.    Lab Results:  Lab Results  Component Value Date   WBC 7.8 06/26/2014   HGB 10.3* 06/26/2014   HCT 31.3* 06/26/2014   MCV 105.0* 06/26/2014   PLT 199 06/26/2014   NEUTROABS 5.1 06/26/2014    Imaging:  No results found.  Medications: I have reviewed the patient's current medications.  Assessment/Plan: 1. Metastatic colon cancer presenting with synchronous colon primaries in September, 2008, status post a hemicolectomy and liver biopsy 08/03/2007. A CT on 04/20/2010 revealed no evidence metastatic disease. She was maintained on 5-FU/leucovorin per the FOLFOX regimen beginning in April, 2009. Avasta was discontinued from the chemotherapy regimen beginning in November, 2009 due to nephrotic range proteinuria. A CT of the abdomen and  pelvis 02/05/2011 revealed evidence of three small liver lesions. An MRI of the abdomen on 03/02/2011 confirmed 3 liver lesions and no additional evidence of metastatic disease. K-RAS testing on the transverse colon and left colon tumors revealed a wild type genotype.  1A. Staging PET scan 03/21/2011 confirmed 3 hypermetabolic liver lesions and no additional evidence of metastatic disease.  1B. Surgical consultation by Dr. Barry Dienes was obtained and Ms. Hearld decided against an attempt at surgical resection/radiofrequency ablation.  1C. Salvage therapy with FOLFOX was initiated 04/13/2011.  1D. A restaging CT 06/28/2011 revealed a significant improvement in the hepatic metastasis.  1E. She completed eight cycles of FOLFOX chemotherapy. Treatment was switched to 5FU-leucovorin as per the FOLFOX regimen beginning with cycle #9 due to an allergic reaction (skin rash) following cycle #8.  18F. Restaging CT 10/25/2011 revealed 2 more conspicuous liver metastases .  1G. Restaging PET scan 12/24/2011 revealed an increase in the size and metabolic activity of 3 liver metastases  1H. She began FOLFIRI chemotherapy on 01/19/2012.  1 I. Restaging CT of the abdomen 05/08/2012 revealed a mixed response with enlargement of 2 liver lesions and a decrease in the size of a third lesion, no new lesions.  1J. FOLFIRI chemotherapy continued with the addition of Avastin beginning 05/09/2012.  1K. restaging CT 07/28/2012 with no new liver lesions and a decreased size of the previously noted 3 liver lesions.  1L. She was last treated with FOLFIRI/Avastin on 09/12/2012.  14M. She completed stereotactic radiotherapy to 3 liver metastases 10/10/2012 through 10/20/2012.  1N. Follow-up MRI of the abdomen 11/24/2012 showed improvement in the 3 hepatic metastases and  no progressive disease.  1O. Restaging MRI 04/09/2013 showed improvement in the 3 treated hepatic lesions and 2 new lesions. No evidence of metastatic disease outside of  liver.  1P. Initiation of Xeloda/Avastin 05/30/2013. Cycle 2 initiated on 06/20/2013 (Avastin placed on hold)-Xeloda discontinued after 10 days secondary to hand/foot syndrome. Cycle 3 Xeloda question initiated on 07/28/2013 (she is unable to recall when she started or stopped cycle 3). Cycle 4 08/20/2013. Cycle 5 09/14/2013 (no Xeloda). Cycle 6 Xeloda/Avastin 10/01/2013. Cycle 7 Xeloda/Avastin 11/02/2013  1Q. restaging MRI of the liver 11/16/2013-3 enhancing right liver lesion, slightly larger. No evidence of metastasis in the left hepatic lobe.  1R. Xeloda/Avastin continued.  1S. restaging PET scan 01/28/2014 showed 2 hypermetabolic lesions in the liver corresponding to the abnormality seen on the recent MRI. The third lesion seen on the recent MRI was not discernible on the PET. No other evidence for hypermetabolic metastases.  [name] continued.  1U. CT abdomen/pelvis 06/14/2014 with new moderate ascites; large infiltrating lesions again noted within the right hepatic lobe difficult to fully assess on CT. Mildly increased hepatic atrophy with regard to the 2 larger lesions. A smaller lobulated lesion had increased mildly in size. 1. Diabetes. 2. History of proteinuria secondary to Avastin versus diabetes. 3. Mucositis and hand foot syndrome, secondary to 5FU improved with a dose reduction of the 5-FU. 4. Anemia secondary to chemotherapy.  5. Left back and left leg pain with an MRI of the lumbar spine confirming lumbar disk disease. She is status post surgery by Dr. Christella Noa 02/14/2008 with resolution of the back and left leg pain. 6. Rosacea. She is followed by dermatology. A recent flare, now maintained on doxycycline 7. Depression, maintained on Cymbalta-she requested a referral to Dr. Ferdinand Lango 8. History of bilateral knee pain secondary to arthritis -- she takes Vicodin as needed. 9. History of anorexia -- weight loss -- ?Related to depression/anxiety or metastatic colon  cancer. 10. Allergic reaction to oxaliplatin with FOLFOX on 04/13/2011. She was able to tolerate FOLFOX on 04/20/2011 with steroid premedication and a prolonged oxaliplatin infusion. After cycle #8 she developed a rash upon returning home with pruritus. She took Benadryl with resolution of symptoms. This reaction occurred despite steroid premedication. 11. Poor dentition -- she was evaluated by Dr. Enrique Sack. She underwent tooth extractions 07/04/2013. She now has dentures.  12. Neutropenia following cycle 1 of FOLFIRI. The FOLFIRI was dose reduced beginning with cycle 2. She was neutropenic 03/14/2012 and chemotherapy was held for one week.  13. Nausea and diarrhea during the irinotecan infusion on 03/21/2012. Symptoms resolved with atropine. She receives atropine as a premedication with each cycle. 14. Diarrhea following the 05/09/2012 chemotherapy cycle. Likely related to irinotecan. She takes Imodium as needed. 15. Diffuse pain 4-5 days after chemotherapy 07/17/2012-likely toxicity from Neulasta. 16. History of a fall with fracture of the right fifth metacarpal. 17. Hand/foot syndrome secondary to Xeloda-the Xeloda was dose reduced beginning with cycle 3 on 07/23/2013 (?). She is now taking Xeloda 1 week on/1 week off at a reduced dose. 18. Asymmetric fullness in the region of the left clavicular head/sternoclavicular notch.   Disposition: Ms. Rivers continues to have a poor performance status. The recent CT scan showed new ascites. Dr. Benay Spice recommends placing Xeloda and Avastin on hold. We are referring her for a paracentesis with fluid to be sent for cytology. She will return for a followup visit on 07/08/2014 to review the paracentesis results.   Patient seen with Dr. Benay Spice. 25 minutes were  spent face-to-face at today's visit with the majority of that time involved in counseling/coordination of care.  Ned Card ANP/GNP-BC   06/26/2014  2:53 PM  This was a shared visit with Ned Card.  Ms. Stefanick performance status continues to decline. The CT obtained at the emergency room visit reveals new ascites. We decided to place systemic therapy on hold. She will be referred for a diagnostic paracentesis.  Julieanne Manson, M.D.

## 2014-06-28 ENCOUNTER — Encounter: Payer: Self-pay | Admitting: Oncology

## 2014-06-28 NOTE — Progress Notes (Signed)
Per [employer] patient approved [id number]  05/29/14-05/29/15 for Xeloda. I will let billing know and send to medical records.

## 2014-06-28 NOTE — Progress Notes (Signed)
Received letter from [employer].  Pt is approved for Xeloda from 05/29/14 to 05/29/15 or when the benefit cap has been met.  The amount of the grant is $[id number].

## 2014-07-01 ENCOUNTER — Ambulatory Visit (HOSPITAL_COMMUNITY)
Admission: RE | Admit: 2014-07-01 | Discharge: 2014-07-01 | Disposition: A | Discharge status: Home / Self Care | Payer: Medicare PPO | Source: Ambulatory Visit | Attending: Nurse Practitioner | Admitting: Nurse Practitioner

## 2014-07-01 ENCOUNTER — Other Ambulatory Visit: Payer: Self-pay | Admitting: Nurse Practitioner

## 2014-07-01 DIAGNOSIS — C189 Malignant neoplasm of colon, unspecified: Secondary | ICD-10-CM

## 2014-07-01 DIAGNOSIS — C787 Secondary malignant neoplasm of liver and intrahepatic bile duct: Secondary | ICD-10-CM

## 2014-07-01 DIAGNOSIS — R188 Other ascites: Secondary | ICD-10-CM

## 2014-07-08 ENCOUNTER — Telehealth: Payer: Self-pay | Admitting: Oncology

## 2014-07-08 ENCOUNTER — Telehealth: Payer: Self-pay | Admitting: *Deleted

## 2014-07-08 ENCOUNTER — Ambulatory Visit (HOSPITAL_BASED_OUTPATIENT_CLINIC_OR_DEPARTMENT_OTHER): Payer: Commercial Managed Care - HMO | Admitting: Oncology

## 2014-07-08 VITALS — BP 120/85 | HR 75 | Temp 98.1°F | Resp 18 | Ht 61.0 in | Wt 178.3 lb

## 2014-07-08 DIAGNOSIS — C189 Malignant neoplasm of colon, unspecified: Secondary | ICD-10-CM

## 2014-07-08 DIAGNOSIS — C787 Secondary malignant neoplasm of liver and intrahepatic bile duct: Secondary | ICD-10-CM

## 2014-07-08 MED ORDER — HYDROCODONE-ACETAMINOPHEN 5-325 MG PO TABS: 1.0000 | ORAL_TABLET | Freq: Four times a day (QID) | ORAL | Status: DC | PRN

## 2014-07-08 MED ORDER — TRAMADOL HCL 50 MG PO TABS: 50.0000 mg | ORAL_TABLET | Freq: Four times a day (QID) | ORAL | Status: DC | PRN

## 2014-07-08 NOTE — Telephone Encounter (Signed)
Testing request for [employer] [facility]

## 2014-07-08 NOTE — Patient Instructions (Signed)
Please provide office with copy of you Advanced Directive when possible.  Fall Prevention and Home Safety Falls cause injuries and can affect all age groups. It is possible to use preventive measures to significantly decrease the likelihood of falls. There are many simple measures which can make your home safer and prevent falls. OUTDOORS  Repair cracks and edges of walkways and driveways.  Remove high doorway thresholds.  Trim shrubbery on the main path into your home.  Have good outside lighting.  Clear walkways of tools, rocks, debris, and clutter.  Check that handrails are not broken and are securely fastened. Both sides of steps should have handrails.  Have leaves, snow, and ice cleared regularly.  Use sand or salt on walkways during winter months.  In the garage, clean up grease or oil spills. BATHROOM  Install night lights.  Install grab bars by the toilet and in the tub and shower.  Use non-skid mats or decals in the tub or shower.  Place a plastic non-slip stool in the shower to sit on, if needed.  Keep floors dry and clean up all water on the floor immediately.  Remove soap buildup in the tub or shower on a regular basis.  Secure bath mats with non-slip, double-sided rug tape.  Remove throw rugs and tripping hazards from the floors. BEDROOMS  Install night lights.  Make sure a bedside light is easy to reach.  Do not use oversized bedding.  Keep a telephone by your bedside.  Have a firm chair with side arms to use for getting dressed.  Remove throw rugs and tripping hazards from the floor. KITCHEN  Keep handles on pots and pans turned toward the center of the stove. Use back burners when possible.  Clean up spills quickly and allow time for drying.  Avoid walking on wet floors.  Avoid hot utensils and knives.  Position shelves so they are not too high or low.  Place commonly used objects within easy reach.  If necessary, use a sturdy step  stool with a grab bar when reaching.  Keep electrical cables out of the way.  Do not use floor polish or wax that makes floors slippery. If you must use wax, use non-skid floor wax.  Remove throw rugs and tripping hazards from the floor. STAIRWAYS  Never leave objects on stairs.  Place handrails on both sides of stairways and use them. Fix any loose handrails. Make sure handrails on both sides of the stairways are as long as the stairs.  Check carpeting to make sure it is firmly attached along stairs. Make repairs to worn or loose carpet promptly.  Avoid placing throw rugs at the top or bottom of stairways, or properly secure the rug with carpet tape to prevent slippage. Get rid of throw rugs, if possible.  Have an electrician put in a light switch at the top and bottom of the stairs. OTHER FALL PREVENTION TIPS  Wear low-heel or rubber-soled shoes that are supportive and fit well. Wear closed toe shoes.  When using a stepladder, make sure it is fully opened and both spreaders are firmly locked. Do not climb a closed stepladder.  Add color or contrast paint or tape to grab bars and handrails in your home. Place contrasting color strips on first and last steps.  Learn and use mobility aids as needed. Install an electrical emergency response system.  Turn on lights to avoid dark areas. Replace light bulbs that burn out immediately. Get light switches that glow.  Arrange furniture to create clear pathways. Keep furniture in the same place.  Firmly attach carpet with non-skid or double-sided tape.  Eliminate uneven floor surfaces.  Select a carpet pattern that does not visually hide the edge of steps.  Be aware of all pets. OTHER HOME SAFETY TIPS  Set the water temperature for 120 F (48.8 C).  Keep emergency numbers on or near the telephone.  Keep smoke detectors on every level of the home and near sleeping areas. Document Released: 11/05/2002 Document Revised: 05/16/2012  Document Reviewed: 02/04/2012 [facility] Patient Information 2015 [place], [place]. This information is not intended to replace advice given to you by your health care provider. Make sure you discuss any questions you have with your health care provider.

## 2014-07-08 NOTE — Progress Notes (Signed)
Per order from Dr. Benay Spice, request sent to K. Sharlett Iles in pathology dept.to  Send colon tumor to [facility] for further testing on Case #: [id number].

## 2014-07-08 NOTE — Telephone Encounter (Signed)
Pt confirmed labs/ov per 08/10 POF, gave pt AVS....KJ

## 2014-07-08 NOTE — Progress Notes (Signed)
Blood sugars range 140's to 160's per daughter. Legs are less swollen now, but feet remain swollen. Abdomen has shrunk considerably per daughter and patient. Bowels working well with occasional ExLax. Reports mouth is tender-poor fitting dentures and the adhesive comes out from plate and is on teeth. Daughter reports loss of strength in upper body also--shakes when she is holding on to objects to walk. Has lost 6 lb since last visit.

## 2014-07-08 NOTE — Progress Notes (Signed)
[place] OFFICE PROGRESS NOTE   Diagnosis: Colon cancer  INTERVAL HISTORY:   She returns as scheduled. She was referred for a diagnostic/therapeutic paracentesis on 07/01/2014. There was not enough ascites to perform a paracentesis. She has a poor appetite. Her activity level in the home has increased. She complains of pain in the knees and back.  Objective:  Vital signs in last 24 hours:  Blood pressure 120/85, pulse 75, temperature 98.1 F (36.7 C), temperature source Oral, resp. rate 18, height _0  (1.549 m), weight 178 lb 4.8 oz (80.876 kg), SpO2 100.00%.    HEENT: No thrush Resp: Lungs clear bilaterally Cardio: Regular rate and rhythm, 2/6 systolic murmur GI: No hepatomegaly, mild tenderness in the right mid upper abdomen, no mass, no apparent ascites Vascular: Trace pitting edema at the left greater than right lower leg and foot  Portacath/PICC-without erythema  Lab Results:  Lab Results  Component Value Date   WBC 7.8 06/26/2014   HGB 10.3* 06/26/2014   HCT 31.3* 06/26/2014   MCV 105.0* 06/26/2014   PLT 199 06/26/2014   NEUTROABS 5.1 06/26/2014    Lab Results  Component Value Date   NA 141 06/26/2014    Lab Results  Component Value Date   CEA 16.4* 05/30/2014    Imaging:  No results found.  Medications: I have reviewed the patient's current medications.  Assessment/Plan: 1. Metastatic colon cancer presenting with synchronous colon primaries in September, 2008, status post a hemicolectomy and liver biopsy 08/03/2007. A CT on 04/20/2010 revealed no evidence metastatic disease. She was maintained on 5-FU/leucovorin per the FOLFOX regimen beginning in April, 2009. Avasta was discontinued from the chemotherapy regimen beginning in November, 2009 due to nephrotic range proteinuria. A CT of the abdomen and pelvis 02/05/2011 revealed evidence of three small liver lesions. An MRI of the abdomen on 03/02/2011 confirmed 3 liver lesions and no additional  evidence of metastatic disease. K-RAS testing on the transverse colon and left colon tumors revealed a wild type genotype.  1A. Staging PET scan 03/21/2011 confirmed 3 hypermetabolic liver lesions and no additional evidence of metastatic disease.  1B. Surgical consultation by Dr. Barry Dienes was obtained and Ms. Churchman decided against an attempt at surgical resection/radiofrequency ablation.  1C. Salvage therapy with FOLFOX was initiated 04/13/2011.  1D. A restaging CT 06/28/2011 revealed a significant improvement in the hepatic metastasis.  1E. She completed eight cycles of FOLFOX chemotherapy. Treatment was switched to 5FU-leucovorin as per the FOLFOX regimen beginning with cycle #9 due to an allergic reaction (skin rash) following cycle #8.  7F. Restaging CT 10/25/2011 revealed 2 more conspicuous liver metastases .  1G. Restaging PET scan 12/24/2011 revealed an increase in the size and metabolic activity of 3 liver metastases  1H. She began FOLFIRI chemotherapy on 01/19/2012.  1 I. Restaging CT of the abdomen 05/08/2012 revealed a mixed response with enlargement of 2 liver lesions and a decrease in the size of a third lesion, no new lesions.  1J. FOLFIRI chemotherapy continued with the addition of Avastin beginning 05/09/2012.  1K. restaging CT 07/28/2012 with no new liver lesions and a decreased size of the previously noted 3 liver lesions.  1L. She was last treated with FOLFIRI/Avastin on 09/12/2012.  102M. She completed stereotactic radiotherapy to 3 liver metastases 10/10/2012 through 10/20/2012.  1N. Follow-up MRI of the abdomen 11/24/2012 showed improvement in the 3 hepatic metastases and no progressive disease.  1O. Restaging MRI 04/09/2013 showed improvement in the 3 treated hepatic lesions and  2 new lesions. No evidence of metastatic disease outside of liver.  1P. Initiation of Xeloda/Avastin 05/30/2013. Cycle 2 initiated on 06/20/2013 (Avastin placed on hold)-Xeloda discontinued after 10 days  secondary to hand/foot syndrome. Cycle 3 Xeloda question initiated on 07/28/2013 (she is unable to recall when she started or stopped cycle 3). Cycle 4 08/20/2013. Cycle 5 09/14/2013 (no Xeloda). Cycle 6 Xeloda/Avastin 10/01/2013. Cycle 7 Xeloda/Avastin 11/02/2013  1Q. restaging MRI of the liver 11/16/2013-3 enhancing right liver lesion, slightly larger. No evidence of metastasis in the left hepatic lobe.  1R. Xeloda/Avastin continued.  1S. restaging PET scan 01/28/2014 showed 2 hypermetabolic lesions in the liver corresponding to the abnormality seen on the recent MRI. The third lesion seen on the recent MRI was not discernible on the PET. No other evidence for hypermetabolic metastases.  [name] continued.  1U. CT abdomen/pelvis 06/14/2014 with new moderate ascites; large infiltrating lesions again noted within the right hepatic lobe difficult to fully assess on CT. Mildly increased hepatic atrophy with regard to the 2 larger lesions. A smaller lobulated lesion had increased mildly in size.  1. Diabetes. 2. History of proteinuria secondary to Avastin versus diabetes. 3. Mucositis and hand foot syndrome, secondary to 5FU improved with a dose reduction of the 5-FU. 4. Anemia secondary to chemotherapy.  5. Left back and left leg pain with an MRI of the lumbar spine confirming lumbar disk disease. She is status post surgery by Dr. Christella Noa 02/14/2008 with resolution of the back and left leg pain. 6. Rosacea. She is followed by dermatology. A recent flare, now maintained on doxycycline 7. Depression, maintained on Cymbalta-she requested a referral to Dr. Ferdinand Lango 8. History of bilateral knee pain secondary to arthritis -- she takes Vicodin as needed. 9. History of anorexia -- weight loss -- ?Related to depression/anxiety or metastatic colon cancer. 10. Allergic reaction to oxaliplatin with FOLFOX on 04/13/2011. She was able to tolerate FOLFOX on 04/20/2011 with steroid premedication and a  prolonged oxaliplatin infusion. After cycle #8 she developed a rash upon returning home with pruritus. She took Benadryl with resolution of symptoms. This reaction occurred despite steroid premedication. 11. Poor dentition -- she was evaluated by Dr. Enrique Sack. She underwent tooth extractions 07/04/2013. She now has dentures.  12. Neutropenia following cycle 1 of FOLFIRI. The FOLFIRI was dose reduced beginning with cycle 2. She was neutropenic 03/14/2012 and chemotherapy was held for one week.  13. Nausea and diarrhea during the irinotecan infusion on 03/21/2012. Symptoms resolved with atropine. She receives atropine as a premedication with each cycle. 14. Diarrhea following the 05/09/2012 chemotherapy cycle. Likely related to irinotecan. She takes Imodium as needed. 15. Diffuse pain 4-5 days after chemotherapy 07/17/2012-likely toxicity from Neulasta. 16. History of a fall with fracture of the right fifth metacarpal. 17. Hand/foot syndrome secondary to Xeloda-the Xeloda was dose reduced beginning with cycle 3 on 07/23/2013 (?). She is now taking Xeloda 1 week on/1 week off at a reduced dose. 18. Asymmetric fullness in the region of the left clavicular head/sternoclavicular notch.   Disposition:  Ms. Sons has a poor performance status. I suspect this is related to progression of the colon cancer, though it is possible diabetes and other comorbid conditions are contributing. She will increase her activity level as tolerated. I reviewed the 06/26/2014 CT with Ms. Goodreau and her daughter. She will return for an office visit and Port-A-Cath flush/CEA in 2 weeks. We will submit her tumor for extended K-ras testing as she may be a candidate for panitumumab.  She understands will not be able to deliver systemic therapy unless her performance status improves. Betsy Coder, MD  07/08/2014  2:24 PM

## 2014-07-22 ENCOUNTER — Other Ambulatory Visit: Payer: Commercial Managed Care - HMO

## 2014-07-22 ENCOUNTER — Ambulatory Visit: Payer: Commercial Managed Care - HMO

## 2014-07-22 ENCOUNTER — Other Ambulatory Visit (HOSPITAL_COMMUNITY)
Admission: RE | Admit: 2014-07-22 | Discharge: 2014-07-22 | Disposition: A | Discharge status: Home / Self Care | Payer: Medicare PPO | Source: Ambulatory Visit | Attending: Oncology | Admitting: Oncology

## 2014-07-22 ENCOUNTER — Ambulatory Visit (HOSPITAL_BASED_OUTPATIENT_CLINIC_OR_DEPARTMENT_OTHER): Payer: Commercial Managed Care - HMO | Admitting: Nurse Practitioner

## 2014-07-22 ENCOUNTER — Telehealth: Payer: Self-pay | Admitting: Oncology

## 2014-07-22 VITALS — BP 123/48 | HR 70 | Temp 97.9°F | Resp 18 | Ht 61.0 in | Wt 178.3 lb

## 2014-07-22 DIAGNOSIS — D6481 Anemia due to antineoplastic chemotherapy: Secondary | ICD-10-CM

## 2014-07-22 DIAGNOSIS — F329 Major depressive disorder, single episode, unspecified: Secondary | ICD-10-CM

## 2014-07-22 DIAGNOSIS — C189 Malignant neoplasm of colon, unspecified: Secondary | ICD-10-CM

## 2014-07-22 DIAGNOSIS — F3289 Other specified depressive episodes: Secondary | ICD-10-CM

## 2014-07-22 DIAGNOSIS — C787 Secondary malignant neoplasm of liver and intrahepatic bile duct: Secondary | ICD-10-CM

## 2014-07-22 DIAGNOSIS — C187 Malignant neoplasm of sigmoid colon: Secondary | ICD-10-CM

## 2014-07-22 DIAGNOSIS — K121 Other forms of stomatitis: Secondary | ICD-10-CM

## 2014-07-22 DIAGNOSIS — L270 Generalized skin eruption due to drugs and medicaments taken internally: Secondary | ICD-10-CM

## 2014-07-22 DIAGNOSIS — T451X5A Adverse effect of antineoplastic and immunosuppressive drugs, initial encounter: Secondary | ICD-10-CM

## 2014-07-22 DIAGNOSIS — Z95828 Presence of other vascular implants and grafts: Secondary | ICD-10-CM

## 2014-07-22 DIAGNOSIS — K1230 Oral mucositis (ulcerative), unspecified: Secondary | ICD-10-CM

## 2014-07-22 DIAGNOSIS — E119 Type 2 diabetes mellitus without complications: Secondary | ICD-10-CM

## 2014-07-22 LAB — COMPREHENSIVE METABOLIC PANEL
ALK PHOS: 254 U/L — AB (ref 39–117)
ALT: 26 U/L (ref 0–35)
AST: 42 U/L — AB (ref 0–37)
Albumin: 3.2 g/dL — ABNORMAL LOW (ref 3.5–5.2)
BILIRUBIN TOTAL: 1.4 mg/dL — AB (ref 0.2–1.2)
BUN: 35 mg/dL — AB (ref 6–23)
CALCIUM: 9.1 mg/dL (ref 8.4–10.5)
CO2: 26 mEq/L (ref 19–32)
Chloride: 96 mEq/L (ref 96–112)
Creatinine, Ser: 1.34 mg/dL — ABNORMAL HIGH (ref 0.50–1.10)
Glucose, Bld: 258 mg/dL — ABNORMAL HIGH (ref 70–99)
Potassium: 3.6 mEq/L (ref 3.5–5.3)
Sodium: 136 mEq/L (ref 135–145)
Total Protein: 6 g/dL (ref 6.0–8.3)

## 2014-07-22 MED ORDER — POTASSIUM CHLORIDE CRYS ER 20 MEQ PO TBCR: 20.0000 meq | EXTENDED_RELEASE_TABLET | Freq: Every day | ORAL | Status: DC

## 2014-07-22 MED ORDER — HEPARIN SOD (PORK) LOCK FLUSH 100 UNIT/ML IV SOLN
500.0000 [IU] | Freq: Once | INTRAVENOUS | Status: AC
  Administered 2014-07-22: 500 [IU] via INTRAVENOUS
  Filled 2014-07-22: qty 5

## 2014-07-22 MED ORDER — SODIUM CHLORIDE 0.9 % IJ SOLN
10.0000 mL | INTRAMUSCULAR | Status: DC | PRN
  Administered 2014-07-22: 10 mL via INTRAVENOUS
  Filled 2014-07-22: qty 10

## 2014-07-22 NOTE — Telephone Encounter (Signed)
gv adn printed appt sched and avs for pt fro Sept... °

## 2014-07-22 NOTE — Patient Instructions (Signed)

## 2014-07-22 NOTE — Progress Notes (Addendum)
[place] OFFICE PROGRESS NOTE   Diagnosis:  Colon cancer.  INTERVAL HISTORY:   Emily Lawson returns as scheduled. Appetite continues to be marginal. She is more active. She has occasional abdominal pain. Abdominal distention and leg swelling continue to be improved.  Objective:  Vital signs in last 24 hours:  Blood pressure 123/48, pulse 70, temperature 97.9 F (36.6 C), temperature source Oral, resp. rate 18, height _0  (1.549 m), weight 178 lb 4.8 oz (80.876 kg), SpO2 100.00%.    HEENT: No thrush or ulcers. Resp: Lungs clear bilaterally. Cardio: Regular rate and rhythm. GI: Abdomen soft and nontender. Nondistended. No apparent ascites. No hepatomegaly. Vascular: Trace to 1+ pitting edema at the lower legs bilaterally left slightly greater than right.   Port-A-Cath site without erythema.    Lab Results:  Lab Results  Component Value Date   WBC 7.8 06/26/2014   HGB 10.3* 06/26/2014   HCT 31.3* 06/26/2014   MCV 105.0* 06/26/2014   PLT 199 06/26/2014   NEUTROABS 5.1 06/26/2014    Imaging:  No results found.  Medications: I have reviewed the patient's current medications.  Assessment/Plan: 1. Metastatic colon cancer presenting with synchronous colon primaries in September, 2008, status post a hemicolectomy and liver biopsy 08/03/2007. A CT on 04/20/2010 revealed no evidence metastatic disease. She was maintained on 5-FU/leucovorin per the FOLFOX regimen beginning in April, 2009. Avasta was discontinued from the chemotherapy regimen beginning in November, 2009 due to nephrotic range proteinuria. A CT of the abdomen and pelvis 02/05/2011 revealed evidence of three small liver lesions. An MRI of the abdomen on 03/02/2011 confirmed 3 liver lesions and no additional evidence of metastatic disease. K-RAS testing on the transverse colon and left colon tumors revealed a wild type genotype.  1A. Staging PET scan 03/21/2011 confirmed 3 hypermetabolic liver lesions and no  additional evidence of metastatic disease.  1B. Surgical consultation by Dr. Barry Dienes was obtained and Emily Lawson decided against an attempt at surgical resection/radiofrequency ablation.  1C. Salvage therapy with FOLFOX was initiated 04/13/2011.  1D. A restaging CT 06/28/2011 revealed a significant improvement in the hepatic metastasis.  1E. She completed eight cycles of FOLFOX chemotherapy. Treatment was switched to 5FU-leucovorin as per the FOLFOX regimen beginning with cycle #9 due to an allergic reaction (skin rash) following cycle #8.  48F. Restaging CT 10/25/2011 revealed 2 more conspicuous liver metastases .  1G. Restaging PET scan 12/24/2011 revealed an increase in the size and metabolic activity of 3 liver metastases  1H. She began FOLFIRI chemotherapy on 01/19/2012.  1 I. Restaging CT of the abdomen 05/08/2012 revealed a mixed response with enlargement of 2 liver lesions and a decrease in the size of a third lesion, no new lesions.  1J. FOLFIRI chemotherapy continued with the addition of Avastin beginning 05/09/2012.  1K. restaging CT 07/28/2012 with no new liver lesions and a decreased size of the previously noted 3 liver lesions.  1L. She was last treated with FOLFIRI/Avastin on 09/12/2012.  52M. She completed stereotactic radiotherapy to 3 liver metastases 10/10/2012 through 10/20/2012.  1N. Follow-up MRI of the abdomen 11/24/2012 showed improvement in the 3 hepatic metastases and no progressive disease.  1O. Restaging MRI 04/09/2013 showed improvement in the 3 treated hepatic lesions and 2 new lesions. No evidence of metastatic disease outside of liver.  1P. Initiation of Xeloda/Avastin 05/30/2013. Cycle 2 initiated on 06/20/2013 (Avastin placed on hold)-Xeloda discontinued after 10 days secondary to hand/foot syndrome. Cycle 3 Xeloda question initiated on 07/28/2013 (she  is unable to recall when she started or stopped cycle 3). Cycle 4 08/20/2013. Cycle 5 09/14/2013 (no Xeloda). Cycle 6  Xeloda/Avastin 10/01/2013. Cycle 7 Xeloda/Avastin 11/02/2013  1Q. restaging MRI of the liver 11/16/2013-3 enhancing right liver lesion, slightly larger. No evidence of metastasis in the left hepatic lobe.  1R. Xeloda/Avastin continued.  1S. restaging PET scan 01/28/2014 showed 2 hypermetabolic lesions in the liver corresponding to the abnormality seen on the recent MRI. The third lesion seen on the recent MRI was not discernible on the PET. No other evidence for hypermetabolic metastases.  Emily Lawson continued.  1U. CT abdomen/pelvis 06/14/2014 with new moderate ascites; large infiltrating lesions again noted within the right hepatic lobe difficult to fully assess on CT. Mildly increased hepatic atrophy with regard to the 2 larger lesions. A smaller lobulated lesion had increased mildly in size.  1. Diabetes. 2. History of proteinuria secondary to Avastin versus diabetes. 3. Mucositis and hand foot syndrome, secondary to 5FU improved with a dose reduction of the 5-FU. 4. Anemia secondary to chemotherapy.  5. Left back and left leg pain with an MRI of the lumbar spine confirming lumbar disk disease. She is status post surgery by Dr. Christella Noa 02/14/2008 with resolution of the back and left leg pain. 6. Rosacea. She is followed by dermatology. A recent flare, now maintained on doxycycline 7. Depression, maintained on Cymbalta-she requested a referral to Dr. Ferdinand Lango 8. History of bilateral knee pain secondary to arthritis -- she takes Vicodin as needed. 9. History of anorexia -- weight loss -- ?Related to depression/anxiety or metastatic colon cancer. 10. Allergic reaction to oxaliplatin with FOLFOX on 04/13/2011. She was able to tolerate FOLFOX on 04/20/2011 with steroid premedication and a prolonged oxaliplatin infusion. After cycle #8 she developed a rash upon returning home with pruritus. She took Benadryl with resolution of symptoms. This reaction occurred despite steroid  premedication. 11. Poor dentition -- she was evaluated by Dr. Enrique Sack. She underwent tooth extractions 07/04/2013. She now has dentures.  12. Neutropenia following cycle 1 of FOLFIRI. The FOLFIRI was dose reduced beginning with cycle 2. She was neutropenic 03/14/2012 and chemotherapy was held for one week.  13. Nausea and diarrhea during the irinotecan infusion on 03/21/2012. Symptoms resolved with atropine. She receives atropine as a premedication with each cycle. 14. Diarrhea following the 05/09/2012 chemotherapy cycle. Likely related to irinotecan. She takes Imodium as needed. 15. Diffuse pain 4-5 days after chemotherapy 07/17/2012-likely toxicity from Neulasta. 16. History of a fall with fracture of the right fifth metacarpal. 17. Hand/foot syndrome secondary to Xeloda-the Xeloda was dose reduced beginning with cycle 3 on 07/23/2013 (?). She is now taking Xeloda 1 week on/1 week off at a reduced dose. 18. Asymmetric fullness in the region of the left clavicular head/sternoclavicular notch.   Disposition: Emily Lawson performance status appears somewhat improved. Extended K-ras testing has been submitted with results currently pending. Dr. Benay Spice recommends following her off of therapy at present. We will contact her once the K-ras results are available. She will return for a followup visit in approximately 3 weeks.   Patient seen with Dr. Benay Spice.    Ned Card ANP/GNP-BC   07/22/2014  3:27 PM  This was a shared visit with Ned Card. We are waiting on extended K-ras testing prior to deciding on further systemic therapy. Her performance status appears improved. I discussed the prognosis with her son.  Julieanne Manson, M.D.

## 2014-07-23 LAB — CEA: CEA: 24.2 ng/mL — AB (ref 0.0–5.0)

## 2014-07-24 ENCOUNTER — Telehealth: Payer: Self-pay

## 2014-07-24 NOTE — Telephone Encounter (Signed)
Informed of potassium level and to continue taking Kdur 20 meq daily. Verbalized understanding, denies any questions or concerns.

## 2014-07-24 NOTE — Telephone Encounter (Signed)
Message copied by Bevelyn Ngo on Wed Jul 24, 2014  9:29 AM ------      Message from: [place], [place]      Created: Wed Jul 24, 2014  9:12 AM       Please let her know potassium is in low normal range. She needs to continue Kdur 20 meq daily.  ------

## 2014-07-25 ENCOUNTER — Telehealth: Payer: Self-pay | Admitting: *Deleted

## 2014-07-25 NOTE — Telephone Encounter (Signed)
Left VM requesting nurse to call (did not state her need).

## 2014-07-25 NOTE — Telephone Encounter (Signed)
Attempted to return call-line busy on two attempts.

## 2014-08-02 ENCOUNTER — Telehealth: Payer: Self-pay | Admitting: *Deleted

## 2014-08-02 NOTE — Telephone Encounter (Signed)
Received faxed report from [facility]. Results to Dr. Gearldine Shown desk for review.

## 2014-08-06 ENCOUNTER — Encounter (HOSPITAL_COMMUNITY): Payer: Self-pay

## 2014-08-09 ENCOUNTER — Encounter (HOSPITAL_COMMUNITY): Payer: Self-pay

## 2014-08-09 ENCOUNTER — Telehealth: Payer: Self-pay | Admitting: *Deleted

## 2014-08-09 NOTE — Telephone Encounter (Signed)
Spoke with pt's daughter Sonny [facility] results given. Mutation testing negative, may use EGFR inhibitors if necessary. Explained that this give her more treatment options, [place] voiced understanding. MD will discuss further at next office visit.

## 2014-08-15 ENCOUNTER — Telehealth: Payer: Self-pay | Admitting: Oncology

## 2014-08-15 ENCOUNTER — Other Ambulatory Visit: Payer: Commercial Managed Care - HMO

## 2014-08-15 ENCOUNTER — Ambulatory Visit (HOSPITAL_BASED_OUTPATIENT_CLINIC_OR_DEPARTMENT_OTHER): Payer: Commercial Managed Care - HMO | Admitting: Oncology

## 2014-08-15 VITALS — BP 98/85 | HR 67 | Temp 98.3°F | Resp 18 | Ht 61.0 in | Wt 182.9 lb

## 2014-08-15 DIAGNOSIS — C187 Malignant neoplasm of sigmoid colon: Secondary | ICD-10-CM

## 2014-08-15 DIAGNOSIS — C189 Malignant neoplasm of colon, unspecified: Secondary | ICD-10-CM

## 2014-08-15 DIAGNOSIS — C787 Secondary malignant neoplasm of liver and intrahepatic bile duct: Secondary | ICD-10-CM

## 2014-08-15 LAB — COMPREHENSIVE METABOLIC PANEL
ALK PHOS: 246 U/L — AB (ref 39–117)
ALT: 27 U/L (ref 0–35)
AST: 34 U/L (ref 0–37)
Albumin: 3.1 g/dL — ABNORMAL LOW (ref 3.5–5.2)
BILIRUBIN TOTAL: 2 mg/dL — AB (ref 0.2–1.2)
BUN: 23 mg/dL (ref 6–23)
CO2: 21 mEq/L (ref 19–32)
Calcium: 9.7 mg/dL (ref 8.4–10.5)
Chloride: 101 mEq/L (ref 96–112)
Creatinine, Ser: 1.27 mg/dL — ABNORMAL HIGH (ref 0.50–1.10)
GLUCOSE: 171 mg/dL — AB (ref 70–99)
Potassium: 4.5 mEq/L (ref 3.5–5.3)
SODIUM: 137 meq/L (ref 135–145)
TOTAL PROTEIN: 6.3 g/dL (ref 6.0–8.3)

## 2014-08-15 NOTE — Progress Notes (Signed)
[facility] OFFICE PROGRESS NOTE   Diagnosis: Colon cancer  INTERVAL HISTORY:   She returns as scheduled. She has multiple complaints including pain at the right posterior neck, generalized weakness, and anorexia. She has vomited a few times. Her bowels are moving. She has noted hearing loss.  Objective:  Vital signs in last 24 hours:  Blood pressure 98/85, pulse 67, temperature 98.3 F (36.8 C), temperature source Oral, resp. rate 18, height 5' 1"  (1.549 m), weight 182 lb 14.4 oz (82.963 kg).    HEENT: Neck without mass or tenderness. Both external canals are blocked with cerumen Lymphatics: No cervical or supraclavicular nodes Resp: Lungs clear bilaterally Cardio: Regular rate and rhythm GI: No hepatomegaly, nontender Vascular: Pitting edema at the lower leg and foot bilaterally  Portacath/PICC-without erythema  Lab Results:  Lab Results  Component Value Date   WBC 7.8 06/26/2014   HGB 10.3* 06/26/2014   HCT 31.3* 06/26/2014   MCV 105.0* 06/26/2014   PLT 199 06/26/2014   NEUTROABS 5.1 06/26/2014     Lab Results  Component Value Date   CEA 24.2* 07/22/2014    Medications: I have reviewed the patient's current medications.  Assessment/Plan: 1. Metastatic colon cancer presenting with synchronous colon primaries in September, 2008, status post a hemicolectomy and liver biopsy 08/03/2007. A CT on 04/20/2010 revealed no evidence metastatic disease. She was maintained on 5-FU/leucovorin per the FOLFOX regimen beginning in April, 2009. Avasta was discontinued from the chemotherapy regimen beginning in November, 2009 due to nephrotic range proteinuria. A CT of the abdomen and pelvis 02/05/2011 revealed evidence of three small liver lesions. An MRI of the abdomen on 03/02/2011 confirmed 3 liver lesions and no additional evidence of metastatic disease. K-RAS testing on the transverse colon and left colon tumors revealed a wild type genotype. Foundation 1 testing  revealed no K-ras, NRAS, or BRAF mutation. 1A. Staging PET scan 03/21/2011 confirmed 3 hypermetabolic liver lesions and no additional evidence of metastatic disease.  1B. Surgical consultation by Dr. Barry Dienes was obtained and Ms. Millspaugh decided against an attempt at surgical resection/radiofrequency ablation.  1C. Salvage therapy with FOLFOX was initiated 04/13/2011.  1D. A restaging CT 06/28/2011 revealed a significant improvement in the hepatic metastasis.  1E. She completed eight cycles of FOLFOX chemotherapy. Treatment was switched to 5FU-leucovorin as per the FOLFOX regimen beginning with cycle #9 due to an allergic reaction (skin rash) following cycle #8.  66F. Restaging CT 10/25/2011 revealed 2 more conspicuous liver metastases .  1G. Restaging PET scan 12/24/2011 revealed an increase in the size and metabolic activity of 3 liver metastases  1H. She began FOLFIRI chemotherapy on 01/19/2012.  1 I. Restaging CT of the abdomen 05/08/2012 revealed a mixed response with enlargement of 2 liver lesions and a decrease in the size of a third lesion, no new lesions.  1J. FOLFIRI chemotherapy continued with the addition of Avastin beginning 05/09/2012.  1K. restaging CT 07/28/2012 with no new liver lesions and a decreased size of the previously noted 3 liver lesions.  1L. She was last treated with FOLFIRI/Avastin on 09/12/2012.  57M. She completed stereotactic radiotherapy to 3 liver metastases 10/10/2012 through 10/20/2012.  1N. Follow-up MRI of the abdomen 11/24/2012 showed improvement in the 3 hepatic metastases and no progressive disease.  1O. Restaging MRI 04/09/2013 showed improvement in the 3 treated hepatic lesions and 2 new lesions. No evidence of metastatic disease outside of liver.  1P. Initiation of Xeloda/Avastin 05/30/2013. Cycle 2 initiated on 06/20/2013 (Avastin placed on  hold)-Xeloda discontinued after 10 days secondary to hand/foot syndrome. Cycle 3 Xeloda question initiated on 07/28/2013  (she is unable to recall when she started or stopped cycle 3). Cycle 4 08/20/2013. Cycle 5 09/14/2013 (no Xeloda). Cycle 6 Xeloda/Avastin 10/01/2013. Cycle 7 Xeloda/Avastin 11/02/2013  1Q. restaging MRI of the liver 11/16/2013-3 enhancing right liver lesion, slightly larger. No evidence of metastasis in the left hepatic lobe.  1R. Xeloda/Avastin continued.  1S. restaging PET scan 01/28/2014 showed 2 hypermetabolic lesions in the liver corresponding to the abnormality seen on the recent MRI. The third lesion seen on the recent MRI was not discernible on the PET. No other evidence for hypermetabolic metastases.  [name] continued.  1U. CT abdomen/pelvis 06/14/2014 with new moderate ascites; large infiltrating lesions again noted within the right hepatic lobe difficult to fully assess on CT. Mildly increased hepatic atrophy with regard to the 2 larger lesions. A smaller lobulated lesion had increased mildly in size.  1. Diabetes. 2. History of proteinuria secondary to Avastin versus diabetes. 3. Mucositis and hand foot syndrome, secondary to 5FU improved with a dose reduction of the 5-FU. 4. Anemia secondary to chemotherapy.  5. Left back and left leg pain with an MRI of the lumbar spine confirming lumbar disk disease. She is status post surgery by Dr. Christella Noa 02/14/2008 with resolution of the back and left leg pain. 6. Rosacea. She is followed by dermatology. A recent flare, now maintained on doxycycline 7. Depression, maintained on Cymbalta-she requested a referral to Dr. Ferdinand Lango 8. History of bilateral knee pain secondary to arthritis -- she takes Vicodin as needed. 9. History of anorexia -- weight loss -- ?Related to depression/anxiety or metastatic colon cancer. 10. Allergic reaction to oxaliplatin with FOLFOX on 04/13/2011. She was able to tolerate FOLFOX on 04/20/2011 with steroid premedication and a prolonged oxaliplatin infusion. After cycle #8 she developed a rash upon returning home  with pruritus. She took Benadryl with resolution of symptoms. This reaction occurred despite steroid premedication. 11. Poor dentition -- she was evaluated by Dr. Enrique Sack. She underwent tooth extractions 07/04/2013. She now has dentures.  12. Neutropenia following cycle 1 of FOLFIRI. The FOLFIRI was dose reduced beginning with cycle 2. She was neutropenic 03/14/2012 and chemotherapy was held for one week.  13. Nausea and diarrhea during the irinotecan infusion on 03/21/2012. Symptoms resolved with atropine. She receives atropine as a premedication with each cycle. 14. Diarrhea following the 05/09/2012 chemotherapy cycle. Likely related to irinotecan. She takes Imodium as needed. 15. Diffuse pain 4-5 days after chemotherapy 07/17/2012-likely toxicity from Neulasta. 16. History of a fall with fracture of the right fifth metacarpal. 17. Hand/foot syndrome secondary to Xeloda-the Xeloda was dose reduced beginning with cycle 3 on 07/23/2013 (?). She is now taking Xeloda 1 week on/1 week off at a reduced dose.   Disposition:  Emily Lawson continues to have a poor performance status. This may be related to progression of the metastatic colon cancer, dementia, or cardiac disease. She does not appear to be a candidate for salvage systemic therapy at present. Her caretaker will relay our discussion to her daughter. The daughter will contact me as needed.  We will followup on the CEA from today. Ms. Penelope Coop will see her primary physician or ENT for removal of the cerumen. She will return for an office visit and Port-A-Cath flush in 3 weeks.  Betsy Coder, MD  08/15/2014  1:53 PM

## 2014-08-15 NOTE — Telephone Encounter (Signed)
gv adn printed aptp scehd adn avs for pt fro OCT

## 2014-08-16 ENCOUNTER — Telehealth: Payer: Self-pay | Admitting: *Deleted

## 2014-08-16 LAB — CEA: CEA: 27 ng/mL — ABNORMAL HIGH (ref 0.0–5.0)

## 2014-08-16 NOTE — Telephone Encounter (Signed)
Call from pt's daughter requesting to speak with Dr. Benay Spice re: office visit 9/17. Dr. Benay Spice called [facility], will discuss treatment options in further detail at visit 10/5.

## 2014-08-20 ENCOUNTER — Other Ambulatory Visit: Payer: Self-pay | Admitting: Family Medicine

## 2014-08-20 ENCOUNTER — Ambulatory Visit
Admission: RE | Admit: 2014-08-20 | Discharge: 2014-08-20 | Disposition: A | Discharge status: Home / Self Care | Payer: Medicare HMO | Source: Ambulatory Visit | Attending: Family Medicine | Admitting: Family Medicine

## 2014-08-20 DIAGNOSIS — M79671 Pain in right foot: Secondary | ICD-10-CM

## 2014-09-02 ENCOUNTER — Ambulatory Visit: Payer: Medicare HMO

## 2014-09-02 ENCOUNTER — Telehealth: Payer: Self-pay | Admitting: Oncology

## 2014-09-02 ENCOUNTER — Ambulatory Visit (HOSPITAL_BASED_OUTPATIENT_CLINIC_OR_DEPARTMENT_OTHER): Payer: Commercial Managed Care - HMO | Admitting: Nurse Practitioner

## 2014-09-02 VITALS — BP 138/57 | HR 70 | Temp 98.3°F | Resp 18 | Ht 61.0 in | Wt 180.3 lb

## 2014-09-02 DIAGNOSIS — C189 Malignant neoplasm of colon, unspecified: Secondary | ICD-10-CM

## 2014-09-02 DIAGNOSIS — C787 Secondary malignant neoplasm of liver and intrahepatic bile duct: Secondary | ICD-10-CM

## 2014-09-02 DIAGNOSIS — E119 Type 2 diabetes mellitus without complications: Secondary | ICD-10-CM

## 2014-09-02 MED ORDER — MINOCYCLINE HCL 100 MG PO CAPS: 100.0000 mg | ORAL_CAPSULE | Freq: Two times a day (BID) | ORAL | Status: DC

## 2014-09-02 MED ORDER — HEPARIN SOD (PORK) LOCK FLUSH 100 UNIT/ML IV SOLN
500.0000 [IU] | Freq: Once | INTRAVENOUS | Status: AC
  Administered 2014-09-02: 500 [IU] via INTRAVENOUS
  Filled 2014-09-02: qty 5

## 2014-09-02 MED ORDER — SODIUM CHLORIDE 0.9 % IJ SOLN
10.0000 mL | INTRAMUSCULAR | Status: DC | PRN
  Administered 2014-09-02: 10 mL via INTRAVENOUS
  Filled 2014-09-02: qty 10

## 2014-09-02 NOTE — Telephone Encounter (Signed)
gv and pirnted appt sched and avs for pt for OCT....sed added tx.

## 2014-09-02 NOTE — Progress Notes (Addendum)
[facility] OFFICE PROGRESS NOTE   Diagnosis:  Colon cancer  INTERVAL HISTORY:   She returns as scheduled. She feels that overall she is doing better. She is getting out of the house on some days. Appetite has improved. She has had a single episode of nausea/vomiting since her last visit. Bowels overall moving regularly. She periodically has loose stools.  Objective:  Vital signs in last 24 hours:  Blood pressure 138/57, pulse 70, temperature 98.3 F (36.8 C), temperature source Oral, resp. rate 18, height 5' 1"  (1.549 m), weight 180 lb 4.8 oz (81.784 kg).    HEENT: No thrush or ulcers. Resp: Lungs clear bilaterally. Cardio: Regular rate and rhythm. GI: Abdomen is soft. Mild tenderness at the right upper abdomen. No hepatomegaly. Vascular: Pitting edema at the lower legs/feet. Port-A-Cath without erythema.  Lab Results:  Lab Results  Component Value Date   WBC 7.8 06/26/2014   HGB 10.3* 06/26/2014   HCT 31.3* 06/26/2014   MCV 105.0* 06/26/2014   PLT 199 06/26/2014   NEUTROABS 5.1 06/26/2014    Imaging:  No results found.  Medications: I have reviewed the patient's current medications.  Assessment/Plan: 1. Metastatic colon cancer presenting with synchronous colon primaries in September, 2008, status post a hemicolectomy and liver biopsy 08/03/2007. A CT on 04/20/2010 revealed no evidence metastatic disease. She was maintained on 5-FU/leucovorin per the FOLFOX regimen beginning in April, 2009. Avasta was discontinued from the chemotherapy regimen beginning in November, 2009 due to nephrotic range proteinuria. A CT of the abdomen and pelvis 02/05/2011 revealed evidence of three small liver lesions. An MRI of the abdomen on 03/02/2011 confirmed 3 liver lesions and no additional evidence of metastatic disease. K-RAS testing on the transverse colon and left colon tumors revealed a wild type genotype. Foundation 1 testing revealed no K-ras, NRAS, or BRAF mutation. 1A.  Staging PET scan 03/21/2011 confirmed 3 hypermetabolic liver lesions and no additional evidence of metastatic disease.  1B. Surgical consultation by Dr. Barry Dienes was obtained and Ms. Crissman decided against an attempt at surgical resection/radiofrequency ablation.  1C. Salvage therapy with FOLFOX was initiated 04/13/2011.  1D. A restaging CT 06/28/2011 revealed a significant improvement in the hepatic metastasis.  1E. She completed eight cycles of FOLFOX chemotherapy. Treatment was switched to 5FU-leucovorin as per the FOLFOX regimen beginning with cycle #9 due to an allergic reaction (skin rash) following cycle #8.  32F. Restaging CT 10/25/2011 revealed 2 more conspicuous liver metastases .  1G. Restaging PET scan 12/24/2011 revealed an increase in the size and metabolic activity of 3 liver metastases  1H. She began FOLFIRI chemotherapy on 01/19/2012.  1 I. Restaging CT of the abdomen 05/08/2012 revealed a mixed response with enlargement of 2 liver lesions and a decrease in the size of a third lesion, no new lesions.  1J. FOLFIRI chemotherapy continued with the addition of Avastin beginning 05/09/2012.  1K. restaging CT 07/28/2012 with no new liver lesions and a decreased size of the previously noted 3 liver lesions.  1L. She was last treated with FOLFIRI/Avastin on 09/12/2012.  23M. She completed stereotactic radiotherapy to 3 liver metastases 10/10/2012 through 10/20/2012.  1N. Follow-up MRI of the abdomen 11/24/2012 showed improvement in the 3 hepatic metastases and no progressive disease.  1O. Restaging MRI 04/09/2013 showed improvement in the 3 treated hepatic lesions and 2 new lesions. No evidence of metastatic disease outside of liver.  1P. Initiation of Xeloda/Avastin 05/30/2013. Cycle 2 initiated on 06/20/2013 (Avastin placed on hold)-Xeloda discontinued after 10  days secondary to hand/foot syndrome. Cycle 3 Xeloda question initiated on 07/28/2013 (she is unable to recall when she started or  stopped cycle 3). Cycle 4 08/20/2013. Cycle 5 09/14/2013 (no Xeloda). Cycle 6 Xeloda/Avastin 10/01/2013. Cycle 7 Xeloda/Avastin 11/02/2013  1Q. restaging MRI of the liver 11/16/2013-3 enhancing right liver lesion, slightly larger. No evidence of metastasis in the left hepatic lobe.  1R. Xeloda/Avastin continued.  1S. restaging PET scan 01/28/2014 showed 2 hypermetabolic lesions in the liver corresponding to the abnormality seen on the recent MRI. The third lesion seen on the recent MRI was not discernible on the PET. No other evidence for hypermetabolic metastases.  [name] continued.  1U. CT abdomen/pelvis 06/14/2014 with new moderate ascites; large infiltrating lesions again noted within the right hepatic lobe difficult to fully assess on CT. Mildly increased hepatic atrophy with regard to the 2 larger lesions. A smaller lobulated lesion had increased mildly in size.  1. Diabetes. 2. History of proteinuria secondary to Avastin versus diabetes. 3. Mucositis and hand foot syndrome, secondary to 5FU improved with a dose reduction of the 5-FU. 4. Anemia secondary to chemotherapy.  5. Left back and left leg pain with an MRI of the lumbar spine confirming lumbar disk disease. She is status post surgery by Dr. Christella Noa 02/14/2008 with resolution of the back and left leg pain. 6. Rosacea. She is followed by dermatology. A recent flare, now maintained on doxycycline 7. Depression, maintained on Cymbalta-she requested a referral to Dr. Ferdinand Lango 8. History of bilateral knee pain secondary to arthritis -- she takes Vicodin as needed. 9. History of anorexia -- weight loss -- ?Related to depression/anxiety or metastatic colon cancer. 10. Allergic reaction to oxaliplatin with FOLFOX on 04/13/2011. She was able to tolerate FOLFOX on 04/20/2011 with steroid premedication and a prolonged oxaliplatin infusion. After cycle #8 she developed a rash upon returning home with pruritus. She took Benadryl with  resolution of symptoms. This reaction occurred despite steroid premedication. 11. Poor dentition -- she was evaluated by Dr. Enrique Sack. She underwent tooth extractions 07/04/2013. She now has dentures.  12. Neutropenia following cycle 1 of FOLFIRI. The FOLFIRI was dose reduced beginning with cycle 2. She was neutropenic 03/14/2012 and chemotherapy was held for one week.  13. Nausea and diarrhea during the irinotecan infusion on 03/21/2012. Symptoms resolved with atropine. She receives atropine as a premedication with each cycle. 14. Diarrhea following the 05/09/2012 chemotherapy cycle. Likely related to irinotecan. She takes Imodium as needed. 15. Diffuse pain 4-5 days after chemotherapy 07/17/2012-likely toxicity from Neulasta. 16. History of a fall with fracture of the right fifth metacarpal. 17. Hand/foot syndrome secondary to Xeloda-the Xeloda was dose reduced beginning with cycle 3 on 07/23/2013 (?).    Disposition: Ms. Wethington performance status continues to be poor. She and her daughter understand this is likely due to the metastatic colon cancer but other comorbidities may be contributing as well.  Dr. Benay Spice reviewed systemic treatment options to include irinotecan/Panitumumab, single agent Panitumumab, 5-fluorouracil pump/Panitumumab versus observation.   We reviewed potential toxicities associated with irinotecan including myelosuppression, nausea, diarrhea and hair loss. We discussed potential toxicities associated with Panitumumab including an allergic reaction, skin rash, diarrhea. She is familiar with the potential toxicities associated with 5-fluorouracil.  She would like to avoid hair loss and thus prefers to proceed with 5-fluorouracil/Panitumumab every two-weeks. The 5-fluorouracil will be given via pump over 46 hours.  We are referring her for restaging CT scans this week. We anticipate she will begin cycle 1 5-fluorouracil/Panitumumab  on 09/10/2014.   A prescription will be  sent to her pharmacy for minocycline.  She will return for a followup visit on 09/25/2014. She will contact the office in the interim with any problems.  Patient seen with Dr. Benay Spice. 25 minutes were spent face-to-face at today's visit with the majority of the time involved in counseling/coordination of care.    Ned Card ANP/GNP-BC   09/02/2014  4:17 PM  This was a shared visit with Ned Card. I discussed treatment options with Ms. Heber and her daughter. She decided to proceed with 5-Fu and panitumumab.  Julieanne Manson, MD

## 2014-09-02 NOTE — Patient Instructions (Signed)

## 2014-09-05 ENCOUNTER — Telehealth: Payer: Self-pay | Admitting: *Deleted

## 2014-09-05 NOTE — Telephone Encounter (Signed)
Per staff message and POF I have scheduled appts. Advised scheduler of appts. JMW  

## 2014-09-08 ENCOUNTER — Other Ambulatory Visit: Payer: Self-pay | Admitting: Oncology

## 2014-09-09 ENCOUNTER — Encounter (HOSPITAL_COMMUNITY): Payer: Self-pay

## 2014-09-09 ENCOUNTER — Ambulatory Visit (HOSPITAL_COMMUNITY)
Admission: RE | Admit: 2014-09-09 | Discharge: 2014-09-09 | Disposition: A | Discharge status: Home / Self Care | Payer: Medicare HMO | Source: Ambulatory Visit | Attending: Nurse Practitioner | Admitting: Nurse Practitioner

## 2014-09-09 DIAGNOSIS — R110 Nausea: Secondary | ICD-10-CM | POA: Insufficient documentation

## 2014-09-09 DIAGNOSIS — C787 Secondary malignant neoplasm of liver and intrahepatic bile duct: Secondary | ICD-10-CM | POA: Diagnosis not present

## 2014-09-09 DIAGNOSIS — Z923 Personal history of irradiation: Secondary | ICD-10-CM | POA: Diagnosis not present

## 2014-09-09 DIAGNOSIS — C189 Malignant neoplasm of colon, unspecified: Secondary | ICD-10-CM

## 2014-09-09 DIAGNOSIS — R197 Diarrhea, unspecified: Secondary | ICD-10-CM | POA: Insufficient documentation

## 2014-09-09 DIAGNOSIS — R05 Cough: Secondary | ICD-10-CM | POA: Diagnosis present

## 2014-09-09 DIAGNOSIS — Z79899 Other long term (current) drug therapy: Secondary | ICD-10-CM | POA: Insufficient documentation

## 2014-09-09 MED ORDER — IOHEXOL 300 MG/ML  SOLN
100.0000 mL | Freq: Once | INTRAMUSCULAR | Status: AC | PRN
  Administered 2014-09-09: 100 mL via INTRAVENOUS

## 2014-09-10 ENCOUNTER — Ambulatory Visit (HOSPITAL_BASED_OUTPATIENT_CLINIC_OR_DEPARTMENT_OTHER): Payer: Commercial Managed Care - HMO

## 2014-09-10 ENCOUNTER — Other Ambulatory Visit: Payer: Commercial Managed Care - HMO

## 2014-09-10 ENCOUNTER — Other Ambulatory Visit (HOSPITAL_BASED_OUTPATIENT_CLINIC_OR_DEPARTMENT_OTHER): Payer: Commercial Managed Care - HMO

## 2014-09-10 DIAGNOSIS — C787 Secondary malignant neoplasm of liver and intrahepatic bile duct: Secondary | ICD-10-CM

## 2014-09-10 DIAGNOSIS — Z5111 Encounter for antineoplastic chemotherapy: Secondary | ICD-10-CM

## 2014-09-10 DIAGNOSIS — C188 Malignant neoplasm of overlapping sites of colon: Secondary | ICD-10-CM

## 2014-09-10 DIAGNOSIS — C189 Malignant neoplasm of colon, unspecified: Secondary | ICD-10-CM

## 2014-09-10 DIAGNOSIS — Z5112 Encounter for antineoplastic immunotherapy: Secondary | ICD-10-CM

## 2014-09-10 LAB — CBC WITH DIFFERENTIAL/PLATELET
BASO%: 0.9 % (ref 0.0–2.0)
BASOS ABS: 0 10*3/uL (ref 0.0–0.1)
EOS ABS: 0.3 10*3/uL (ref 0.0–0.5)
EOS%: 5.5 % (ref 0.0–7.0)
HCT: 26.9 % — ABNORMAL LOW (ref 34.8–46.6)
HEMOGLOBIN: 8.8 g/dL — AB (ref 11.6–15.9)
LYMPH%: 17.3 % (ref 14.0–49.7)
MCH: 33.5 pg (ref 25.1–34.0)
MCHC: 32.5 g/dL (ref 31.5–36.0)
MCV: 103.1 fL — ABNORMAL HIGH (ref 79.5–101.0)
MONO#: 0.3 10*3/uL (ref 0.1–0.9)
MONO%: 4.9 % (ref 0.0–14.0)
NEUT%: 71.4 % (ref 38.4–76.8)
NEUTROS ABS: 4 10*3/uL (ref 1.5–6.5)
PLATELETS: 198 10*3/uL (ref 145–400)
RBC: 2.61 10*6/uL — ABNORMAL LOW (ref 3.70–5.45)
RDW: 18.4 % — ABNORMAL HIGH (ref 11.2–14.5)
WBC: 5.6 10*3/uL (ref 3.9–10.3)
lymph#: 1 10*3/uL (ref 0.9–3.3)

## 2014-09-10 LAB — COMPREHENSIVE METABOLIC PANEL (CC13)
ALK PHOS: 292 U/L — AB (ref 40–150)
ALT: 20 U/L (ref 0–55)
AST: 29 U/L (ref 5–34)
Albumin: 2.7 g/dL — ABNORMAL LOW (ref 3.5–5.0)
Anion Gap: 11 mEq/L (ref 3–11)
BILIRUBIN TOTAL: 1.3 mg/dL — AB (ref 0.20–1.20)
BUN: 19.5 mg/dL (ref 7.0–26.0)
CO2: 24 mEq/L (ref 22–29)
CREATININE: 1.5 mg/dL — AB (ref 0.6–1.1)
Calcium: 9.6 mg/dL (ref 8.4–10.4)
Chloride: 103 mEq/L (ref 98–109)
GLUCOSE: 324 mg/dL — AB (ref 70–140)
Potassium: 4 mEq/L (ref 3.5–5.1)
Sodium: 139 mEq/L (ref 136–145)
Total Protein: 6.4 g/dL (ref 6.4–8.3)

## 2014-09-10 MED ORDER — LEUCOVORIN CALCIUM INJECTION 350 MG
250.0000 mg/m2 | Freq: Once | INTRAMUSCULAR | Status: AC
  Administered 2014-09-10: 470 mg via INTRAVENOUS
  Filled 2014-09-10: qty 23.5

## 2014-09-10 MED ORDER — PROCHLORPERAZINE MALEATE 10 MG PO TABS
10.0000 mg | ORAL_TABLET | Freq: Once | ORAL | Status: AC
  Administered 2014-09-10: 10 mg via ORAL

## 2014-09-10 MED ORDER — SODIUM CHLORIDE 0.9 % IJ SOLN
10.0000 mL | INTRAMUSCULAR | Status: DC | PRN
  Filled 2014-09-10: qty 10

## 2014-09-10 MED ORDER — SODIUM CHLORIDE 0.9 % IV SOLN
Freq: Once | INTRAVENOUS | Status: AC
  Administered 2014-09-10: 14:00:00 via INTRAVENOUS

## 2014-09-10 MED ORDER — SODIUM CHLORIDE 0.9 % IV SOLN
6.0000 mg/kg | Freq: Once | INTRAVENOUS | Status: AC
  Administered 2014-09-10: 500 mg via INTRAVENOUS
  Filled 2014-09-10: qty 25

## 2014-09-10 MED ORDER — FLUOROURACIL CHEMO INJECTION 500 MG/10ML
250.0000 mg/m2 | Freq: Once | INTRAVENOUS | Status: AC
Start: 2014-09-10 — End: 2014-09-10
  Administered 2014-09-10: 450 mg via INTRAVENOUS
  Filled 2014-09-10: qty 9

## 2014-09-10 MED ORDER — SODIUM CHLORIDE 0.9 % IV SOLN
1500.0000 mg/m2 | INTRAVENOUS | Status: AC
  Administered 2014-09-10: 2800 mg via INTRAVENOUS
  Filled 2014-09-10: qty 56

## 2014-09-10 MED ORDER — PROCHLORPERAZINE MALEATE 10 MG PO TABS
ORAL_TABLET | ORAL | Status: AC
  Filled 2014-09-10: qty 1

## 2014-09-10 MED ORDER — HEPARIN SOD (PORK) LOCK FLUSH 100 UNIT/ML IV SOLN
500.0000 [IU] | Freq: Once | INTRAVENOUS | Status: AC | PRN
  Filled 2014-09-10: qty 5

## 2014-09-10 NOTE — Patient Instructions (Signed)
[facility] Discharge Instructions for Patients Receiving Chemotherapy  Today you received the following chemotherapy agents Fluorouricil, Vectibix, Leucovorin  To help prevent nausea and vomiting after your treatment, we encourage you to take your nausea medication as directed by MD   If you develop nausea and vomiting that is not controlled by your nausea medication, call the clinic.   BELOW ARE SYMPTOMS THAT SHOULD BE REPORTED IMMEDIATELY:  *FEVER GREATER THAN 100.5 F  *CHILLS WITH OR WITHOUT FEVER  NAUSEA AND VOMITING THAT IS NOT CONTROLLED WITH YOUR NAUSEA MEDICATION  *UNUSUAL SHORTNESS OF BREATH  *UNUSUAL BRUISING OR BLEEDING  TENDERNESS IN MOUTH AND THROAT WITH OR WITHOUT PRESENCE OF ULCERS  *URINARY PROBLEMS  *BOWEL PROBLEMS  UNUSUAL RASH Items with * indicate a potential emergency and should be followed up as soon as possible.  Feel free to call the clinic you have any questions or concerns. The clinic phone number is [phone].   Panitumumab Solution for Injection What is this medicine? PANITUMUMAB (pan i TOOM ue mab) is a chemotherapy drug. It targets a specific protein within cancer cells and stops the cells from growing. It is used to treat colorectal cancer. This medicine may be used for other purposes; ask your health care provider or pharmacist if you have questions. COMMON BRAND NAME(S): Vectibix What should I tell my health care provider before I take this medicine? They need to know if you have any of these conditions: -lung disease, especially lung fibrosis -skin conditions or sensitivity -an unusual or allergic reaction to panitumumab, mouse proteins, other medicines, foods, dyes, or preservatives -pregnant or trying to get pregnant -breast-feeding How should I use this medicine? This drug is given as an infusion into a vein. It is administered in a hospital or clinic by a specially trained health care professional. Talk to your  pediatrician regarding the use of this medicine in children. Special care may be needed. Overdosage: If you think you have taken too much of this medicine contact a poison control center or emergency room at once. NOTE: This medicine is only for you. Do not share this medicine with others. What if I miss a dose? It is important not to miss your dose. Call your doctor or health care professional if you are unable to keep an appointment. What may interact with this medicine? -some medicines for cancer This list may not describe all possible interactions. Give your health care provider a list of all the medicines, herbs, non-prescription drugs, or dietary supplements you use. Also tell them if you smoke, drink alcohol, or use illegal drugs. Some items may interact with your medicine. What should I watch for while using this medicine? Visit your doctor for checks on your progress. This drug may make you feel generally unwell. This is not uncommon, as chemotherapy can affect healthy cells as well as cancer cells. Report any side effects. Continue your course of treatment even though you feel ill unless your doctor tells you to stop. This medicine can make you more sensitive to the sun. Keep out of the sun while receiving this medicine and for 2 months after the last dose. If you cannot avoid being in the sun, wear protective clothing and use sunscreen. Do not use sun lamps or tanning beds/booths. In some cases, you may be given additional medicines to help with side effects. Follow all directions for their use. Call your doctor or health care professional for advice if you get a fever, chills or sore throat,  or other symptoms of a cold or flu. Do not treat yourself. This drug decreases your body's ability to fight infections. Try to avoid being around people who are sick. Avoid taking products that contain aspirin, acetaminophen, ibuprofen, naproxen, or ketoprofen unless instructed by your doctor. These  medicines may hide a fever. Do not become pregnant while taking this medicine and for 6 months after the last dose. Women should inform their doctor if they wish to become pregnant or think they might be pregnant. Men should not father a child while taking this medicine and for 6 months after the last dose. There is a potential for serious side effects to an unborn child. Talk to your health care professional or pharmacist for more information. Do not breast-feed an infant while taking this medicine. What side effects may I notice from receiving this medicine? Side effects that you should report to your doctor or health care professional as soon as possible: -allergic reactions like skin rash, itching or hives, swelling of the face, lips, or tongue -breathing problems -changes in vision -fast, irregular heartbeat -feeling faint or lightheaded, falls -fever, chills -mouth sores -swelling of the ankles, feet, hands -unusually weak or tired Side effects that usually do not require medical attention (report to your doctor or health care professional if they continue or are bothersome): -changes in skin like acne, cracks, skin dryness -constipation -diarrhea -eyelash growth -headache -nail changes -nausea, vomiting -stomach upset This list may not describe all possible side effects. Call your doctor for medical advice about side effects. You may report side effects to FDA at 1-800-FDA-1088. Where should I keep my medicine? This drug is given in a hospital or clinic and will not be stored at home. NOTE: This sheet is a summary. It may not cover all possible information. If you have questions about this medicine, talk to your doctor, pharmacist, or health care provider.  2015, Elsevier/Gold Standard. (2011-04-13 11:00:12) Fluorouracil, 5-FU injection What is this medicine? FLUOROURACIL, 5-FU (flure oh YOOR a sil) is a chemotherapy drug. It slows the growth of cancer cells. This medicine is  used to treat many types of cancer like breast cancer, colon or rectal cancer, pancreatic cancer, and stomach cancer. This medicine may be used for other purposes; ask your health care provider or pharmacist if you have questions. COMMON BRAND NAME(S): Adrucil What should I tell my health care provider before I take this medicine? They need to know if you have any of these conditions: -blood disorders -dihydropyrimidine dehydrogenase (DPD) deficiency -infection (especially a virus infection such as chickenpox, cold sores, or herpes) -kidney disease -liver disease -malnourished, poor nutrition -recent or ongoing radiation therapy -an unusual or allergic reaction to fluorouracil, other chemotherapy, other medicines, foods, dyes, or preservatives -pregnant or trying to get pregnant -breast-feeding How should I use this medicine? This drug is given as an infusion or injection into a vein. It is administered in a hospital or clinic by a specially trained health care professional. Talk to your pediatrician regarding the use of this medicine in children. Special care may be needed. Overdosage: If you think you have taken too much of this medicine contact a poison control center or emergency room at once. NOTE: This medicine is only for you. Do not share this medicine with others. What if I miss a dose? It is important not to miss your dose. Call your doctor or health care professional if you are unable to keep an appointment. What may interact with this medicine? -allopurinol -  cimetidine -dapsone -digoxin -hydroxyurea -leucovorin -levamisole -medicines for seizures like ethotoin, fosphenytoin, phenytoin -medicines to increase blood counts like filgrastim, pegfilgrastim, sargramostim -medicines that treat or prevent blood clots like warfarin, enoxaparin, and dalteparin -methotrexate -metronidazole -pyrimethamine -some other chemotherapy drugs like busulfan, cisplatin, estramustine,  vinblastine -trimethoprim -trimetrexate -vaccines Talk to your doctor or health care professional before taking any of these medicines: -acetaminophen -aspirin -ibuprofen -ketoprofen -naproxen This list may not describe all possible interactions. Give your health care provider a list of all the medicines, herbs, non-prescription drugs, or dietary supplements you use. Also tell them if you smoke, drink alcohol, or use illegal drugs. Some items may interact with your medicine. What should I watch for while using this medicine? Visit your doctor for checks on your progress. This drug may make you feel generally unwell. This is not uncommon, as chemotherapy can affect healthy cells as well as cancer cells. Report any side effects. Continue your course of treatment even though you feel ill unless your doctor tells you to stop. In some cases, you may be given additional medicines to help with side effects. Follow all directions for their use. Call your doctor or health care professional for advice if you get a fever, chills or sore throat, or other symptoms of a cold or flu. Do not treat yourself. This drug decreases your body's ability to fight infections. Try to avoid being around people who are sick. This medicine may increase your risk to bruise or bleed. Call your doctor or health care professional if you notice any unusual bleeding. Be careful brushing and flossing your teeth or using a toothpick because you may get an infection or bleed more easily. If you have any dental work done, tell your dentist you are receiving this medicine. Avoid taking products that contain aspirin, acetaminophen, ibuprofen, naproxen, or ketoprofen unless instructed by your doctor. These medicines may hide a fever. Do not become pregnant while taking this medicine. Women should inform their doctor if they wish to become pregnant or think they might be pregnant. There is a potential for serious side effects to an unborn  child. Talk to your health care professional or pharmacist for more information. Do not breast-feed an infant while taking this medicine. Men should inform their doctor if they wish to father a child. This medicine may lower sperm counts. Do not treat diarrhea with over the counter products. Contact your doctor if you have diarrhea that lasts more than 2 days or if it is severe and watery. This medicine can make you more sensitive to the sun. Keep out of the sun. If you cannot avoid being in the sun, wear protective clothing and use sunscreen. Do not use sun lamps or tanning beds/booths. What side effects may I notice from receiving this medicine? Side effects that you should report to your doctor or health care professional as soon as possible: -allergic reactions like skin rash, itching or hives, swelling of the face, lips, or tongue -low blood counts - this medicine may decrease the number of white blood cells, red blood cells and platelets. You may be at increased risk for infections and bleeding. -signs of infection - fever or chills, cough, sore throat, pain or difficulty passing urine -signs of decreased platelets or bleeding - bruising, pinpoint red spots on the skin, black, tarry stools, blood in the urine -signs of decreased red blood cells - unusually weak or tired, fainting spells, lightheadedness -breathing problems -changes in vision -chest pain -mouth sores -nausea and vomiting -  pain, swelling, redness at site where injected -pain, tingling, numbness in the hands or feet -redness, swelling, or sores on hands or feet -stomach pain -unusual bleeding Side effects that usually do not require medical attention (report to your doctor or health care professional if they continue or are bothersome): -changes in finger or toe nails -diarrhea -dry or itchy skin -hair loss -headache -loss of appetite -sensitivity of eyes to the light -stomach upset -unusually teary eyes This list  may not describe all possible side effects. Call your doctor for medical advice about side effects. You may report side effects to FDA at 1-800-FDA-1088. Where should I keep my medicine? This drug is given in a hospital or clinic and will not be stored at home. NOTE: This sheet is a summary. It may not cover all possible information. If you have questions about this medicine, talk to your doctor, pharmacist, or health care provider.  2015, Elsevier/Gold Standard. (2008-03-20 13:53:16) Leucovorin injection What is this medicine? LEUCOVORIN (loo koe VOR in) is used to prevent or treat the harmful effects of some medicines. This medicine is used to treat anemia caused by a low amount of folic acid in the body. It is also used with 5-fluorouracil (5-FU) to treat colon cancer. This medicine may be used for other purposes; ask your health care provider or pharmacist if you have questions. What should I tell my health care provider before I take this medicine? They need to know if you have any of these conditions: -anemia from low levels of vitamin B-12 in the blood -an unusual or allergic reaction to leucovorin, folic acid, other medicines, foods, dyes, or preservatives -pregnant or trying to get pregnant -breast-feeding How should I use this medicine? This medicine is for injection into a muscle or into a vein. It is given by a health care professional in a hospital or clinic setting. Talk to your pediatrician regarding the use of this medicine in children. Special care may be needed. Overdosage: If you think you have taken too much of this medicine contact a poison control center or emergency room at once. NOTE: This medicine is only for you. Do not share this medicine with others. What if I miss a dose? This does not apply. What may interact with this medicine? -capecitabine -fluorouracil -phenobarbital -phenytoin -primidone -trimethoprim-sulfamethoxazole This list may not describe all  possible interactions. Give your health care provider a list of all the medicines, herbs, non-prescription drugs, or dietary supplements you use. Also tell them if you smoke, drink alcohol, or use illegal drugs. Some items may interact with your medicine. What should I watch for while using this medicine? Your condition will be monitored carefully while you are receiving this medicine. This medicine may increase the side effects of 5-fluorouracil, 5-FU. Tell your doctor or health care professional if you have diarrhea or mouth sores that do not get better or that get worse. What side effects may I notice from receiving this medicine? Side effects that you should report to your doctor or health care professional as soon as possible: -allergic reactions like skin rash, itching or hives, swelling of the face, lips, or tongue -breathing problems -fever, infection -mouth sores -unusual bleeding or bruising -unusually weak or tired Side effects that usually do not require medical attention (report to your doctor or health care professional if they continue or are bothersome): -constipation or diarrhea -loss of appetite -nausea, vomiting This list may not describe all possible side effects. Call your doctor for medical advice  about side effects. You may report side effects to FDA at 1-800-FDA-1088. Where should I keep my medicine? This drug is given in a hospital or clinic and will not be stored at home. NOTE: This sheet is a summary. It may not cover all possible information. If you have questions about this medicine, talk to your doctor, pharmacist, or health care provider.  2015, Elsevier/Gold Standard. (2008-05-21 16:50:29)

## 2014-09-10 NOTE — Progress Notes (Signed)
Per Dr. Benay Spice, [place] to tx with Sr Cr of 1.5 from 09/09/14.

## 2014-09-11 LAB — CEA: CEA: 31 ng/mL — AB (ref 0.0–5.0)

## 2014-09-11 NOTE — Progress Notes (Signed)
Patient discharged home with chemo spill kit (since it haand instructed to return on Thursday at 4:45pm to get chemo pump off. Patient wheeled to the car where her daughter picked her up. Gave AVS with time to come back to daughter.

## 2014-09-12 ENCOUNTER — Ambulatory Visit (HOSPITAL_BASED_OUTPATIENT_CLINIC_OR_DEPARTMENT_OTHER): Payer: Commercial Managed Care - HMO

## 2014-09-12 VITALS — BP 145/49 | HR 62 | Temp 98.0°F | Resp 18

## 2014-09-12 DIAGNOSIS — Z5189 Encounter for other specified aftercare: Secondary | ICD-10-CM

## 2014-09-12 DIAGNOSIS — C189 Malignant neoplasm of colon, unspecified: Secondary | ICD-10-CM

## 2014-09-12 DIAGNOSIS — C787 Secondary malignant neoplasm of liver and intrahepatic bile duct: Secondary | ICD-10-CM

## 2014-09-12 DIAGNOSIS — C188 Malignant neoplasm of overlapping sites of colon: Secondary | ICD-10-CM

## 2014-09-12 MED ORDER — SODIUM CHLORIDE 0.9 % IJ SOLN
10.0000 mL | INTRAMUSCULAR | Status: DC | PRN
  Administered 2014-09-12: 10 mL
  Filled 2014-09-12: qty 10

## 2014-09-12 MED ORDER — HEPARIN SOD (PORK) LOCK FLUSH 100 UNIT/ML IV SOLN
500.0000 [IU] | Freq: Once | INTRAVENOUS | Status: AC | PRN
  Administered 2014-09-12: 500 [IU]
  Filled 2014-09-12: qty 5

## 2014-09-12 NOTE — Patient Instructions (Signed)
[facility] Discharge Instructions for Patients Receiving Chemotherapy  Today you received the following chemotherapy agents 5FU  To help prevent nausea and vomiting after your treatment, we encourage you to take your nausea medication as prescribed   If you develop nausea and vomiting that is not controlled by your nausea medication, call the clinic.   BELOW ARE SYMPTOMS THAT SHOULD BE REPORTED IMMEDIATELY:  *FEVER GREATER THAN 100.5 F  *CHILLS WITH OR WITHOUT FEVER  NAUSEA AND VOMITING THAT IS NOT CONTROLLED WITH YOUR NAUSEA MEDICATION  *UNUSUAL SHORTNESS OF BREATH  *UNUSUAL BRUISING OR BLEEDING  TENDERNESS IN MOUTH AND THROAT WITH OR WITHOUT PRESENCE OF ULCERS  *URINARY PROBLEMS  *BOWEL PROBLEMS  UNUSUAL RASH Items with * indicate a potential emergency and should be followed up as soon as possible.  Feel free to call the clinic you have any questions or concerns. The clinic phone number is [phone].    

## 2014-09-18 ENCOUNTER — Other Ambulatory Visit: Payer: Self-pay | Admitting: *Deleted

## 2014-09-18 MED ORDER — LIDOCAINE-PRILOCAINE 2.5-2.5 % EX CREA: TOPICAL_CREAM | CUTANEOUS | Status: AC

## 2014-09-22 ENCOUNTER — Other Ambulatory Visit: Payer: Self-pay | Admitting: Oncology

## 2014-09-23 ENCOUNTER — Telehealth: Payer: Self-pay | Admitting: *Deleted

## 2014-09-23 NOTE — Telephone Encounter (Signed)
Left VM that she needs to cancel her appointments for tomorrow. Her husband is in the hospital and not doing well. Dr. Benay Spice notified. Reschedule for next week.

## 2014-09-24 ENCOUNTER — Telehealth: Payer: Self-pay | Admitting: Oncology

## 2014-09-24 ENCOUNTER — Other Ambulatory Visit: Payer: Self-pay | Admitting: *Deleted

## 2014-09-24 DIAGNOSIS — C787 Secondary malignant neoplasm of liver and intrahepatic bile duct: Secondary | ICD-10-CM

## 2014-09-24 NOTE — Telephone Encounter (Signed)
vm full....mailed pt appt sched and letter °

## 2014-09-25 ENCOUNTER — Ambulatory Visit: Payer: Commercial Managed Care - HMO

## 2014-09-25 ENCOUNTER — Ambulatory Visit: Payer: Commercial Managed Care - HMO | Admitting: Oncology

## 2014-09-25 ENCOUNTER — Other Ambulatory Visit: Payer: Commercial Managed Care - HMO

## 2014-10-01 ENCOUNTER — Telehealth: Payer: Self-pay | Admitting: Oncology

## 2014-10-01 ENCOUNTER — Telehealth: Payer: Self-pay | Admitting: *Deleted

## 2014-10-01 NOTE — Telephone Encounter (Signed)
Per staff message and POF I have scheduled appts. Advised scheduler of appts. JMW  

## 2014-10-01 NOTE — Telephone Encounter (Signed)
Lft msg for pt confirming labs/ov/pump per 11/03 POF r/s from 11/05 to 11/10, sent msg to r/s chemo also mailed updated sch.... Cherylann Banas

## 2014-10-01 NOTE — Telephone Encounter (Signed)
Message from pt requesting to reschedule office visit and chemo to next week. Husband passed away. Does not want treatment 11/5. Order entered for schedulers to call pt with new appt. Dr. Benay Spice notified.

## 2014-10-02 ENCOUNTER — Telehealth: Payer: Self-pay | Admitting: *Deleted

## 2014-10-02 NOTE — Telephone Encounter (Signed)
Called to request an appointment to be seen and get her treatment started back. Made her aware that it is on 10/08/14 at 10:15 for lab/OV/chemo.

## 2014-10-03 ENCOUNTER — Telehealth: Payer: Self-pay | Admitting: *Deleted

## 2014-10-03 ENCOUNTER — Ambulatory Visit: Payer: Commercial Managed Care - HMO

## 2014-10-03 ENCOUNTER — Ambulatory Visit: Payer: Commercial Managed Care - HMO | Admitting: Nurse Practitioner

## 2014-10-03 ENCOUNTER — Other Ambulatory Visit: Payer: Commercial Managed Care - HMO

## 2014-10-03 MED ORDER — MAGIC MOUTHWASH: 5.0000 mL | Freq: Four times a day (QID) | ORAL | Status: AC | PRN

## 2014-10-03 NOTE — Telephone Encounter (Signed)
Script for [employer] printed instead of going electronically. Called in script.

## 2014-10-03 NOTE — Telephone Encounter (Signed)
Called to report she has sores on side of her tongue and at gum line. Tongue is red and painful. Hurts to eat. Has been using the biotene without any improvement. She denies any sore throat, difficulty swallowing or fever. Suggested she avoid extreme hot & cold and acidic and rough foods. Try 8 ounces warm water with 1/4 tsp. Baking soda and rinse mouth several times/day. Will inquire if MD will approve MMW.

## 2014-10-06 ENCOUNTER — Other Ambulatory Visit: Payer: Self-pay | Admitting: Oncology

## 2014-10-08 ENCOUNTER — Other Ambulatory Visit (HOSPITAL_BASED_OUTPATIENT_CLINIC_OR_DEPARTMENT_OTHER): Payer: Commercial Managed Care - HMO

## 2014-10-08 ENCOUNTER — Ambulatory Visit (HOSPITAL_BASED_OUTPATIENT_CLINIC_OR_DEPARTMENT_OTHER): Payer: Commercial Managed Care - HMO

## 2014-10-08 ENCOUNTER — Ambulatory Visit (HOSPITAL_BASED_OUTPATIENT_CLINIC_OR_DEPARTMENT_OTHER): Payer: Commercial Managed Care - HMO | Admitting: Nurse Practitioner

## 2014-10-08 VITALS — BP 105/46 | HR 65 | Temp 98.0°F | Resp 18 | Ht 61.0 in | Wt 176.6 lb

## 2014-10-08 DIAGNOSIS — C787 Secondary malignant neoplasm of liver and intrahepatic bile duct: Secondary | ICD-10-CM

## 2014-10-08 DIAGNOSIS — E119 Type 2 diabetes mellitus without complications: Secondary | ICD-10-CM

## 2014-10-08 DIAGNOSIS — C188 Malignant neoplasm of overlapping sites of colon: Secondary | ICD-10-CM

## 2014-10-08 DIAGNOSIS — C189 Malignant neoplasm of colon, unspecified: Secondary | ICD-10-CM

## 2014-10-08 DIAGNOSIS — Z5112 Encounter for antineoplastic immunotherapy: Secondary | ICD-10-CM

## 2014-10-08 DIAGNOSIS — Z5111 Encounter for antineoplastic chemotherapy: Secondary | ICD-10-CM

## 2014-10-08 LAB — COMPREHENSIVE METABOLIC PANEL (CC13)
ALK PHOS: 231 U/L — AB (ref 40–150)
ALT: 14 U/L (ref 0–55)
AST: 24 U/L (ref 5–34)
Albumin: 2.4 g/dL — ABNORMAL LOW (ref 3.5–5.0)
Anion Gap: 10 mEq/L (ref 3–11)
BILIRUBIN TOTAL: 1.33 mg/dL — AB (ref 0.20–1.20)
BUN: 32 mg/dL — ABNORMAL HIGH (ref 7.0–26.0)
CO2: 25 mEq/L (ref 22–29)
CREATININE: 1.7 mg/dL — AB (ref 0.6–1.1)
Calcium: 9 mg/dL (ref 8.4–10.4)
Chloride: 103 mEq/L (ref 98–109)
GLUCOSE: 285 mg/dL — AB (ref 70–140)
POTASSIUM: 4.2 meq/L (ref 3.5–5.1)
Sodium: 138 mEq/L (ref 136–145)
Total Protein: 5.9 g/dL — ABNORMAL LOW (ref 6.4–8.3)

## 2014-10-08 LAB — MAGNESIUM (CC13): MAGNESIUM: 1.2 mg/dL — AB (ref 1.5–2.5)

## 2014-10-08 LAB — CBC WITH DIFFERENTIAL/PLATELET
BASO%: 0.9 % (ref 0.0–2.0)
Basophils Absolute: 0.1 10*3/uL (ref 0.0–0.1)
EOS%: 5.5 % (ref 0.0–7.0)
Eosinophils Absolute: 0.5 10*3/uL (ref 0.0–0.5)
HCT: 26.7 % — ABNORMAL LOW (ref 34.8–46.6)
HGB: 8.7 g/dL — ABNORMAL LOW (ref 11.6–15.9)
LYMPH%: 17.9 % (ref 14.0–49.7)
MCH: 33.7 pg (ref 25.1–34.0)
MCHC: 32.7 g/dL (ref 31.5–36.0)
MCV: 103.1 fL — ABNORMAL HIGH (ref 79.5–101.0)
MONO#: 0.5 10*3/uL (ref 0.1–0.9)
MONO%: 5.5 % (ref 0.0–14.0)
NEUT#: 6 10*3/uL (ref 1.5–6.5)
NEUT%: 70.2 % (ref 38.4–76.8)
PLATELETS: 180 10*3/uL (ref 145–400)
RBC: 2.59 10*6/uL — ABNORMAL LOW (ref 3.70–5.45)
RDW: 19 % — AB (ref 11.2–14.5)
WBC: 8.6 10*3/uL (ref 3.9–10.3)
lymph#: 1.5 10*3/uL (ref 0.9–3.3)

## 2014-10-08 MED ORDER — SODIUM CHLORIDE 0.9 % IV SOLN
6.0000 mg/kg | Freq: Once | INTRAVENOUS | Status: AC
  Administered 2014-10-08: 500 mg via INTRAVENOUS
  Filled 2014-10-08: qty 25

## 2014-10-08 MED ORDER — SODIUM CHLORIDE 0.9 % IV SOLN
1500.0000 mg/m2 | INTRAVENOUS | Status: DC
  Administered 2014-10-08: 2800 mg via INTRAVENOUS
  Filled 2014-10-08: qty 56

## 2014-10-08 MED ORDER — SODIUM CHLORIDE 0.9 % IV SOLN
Freq: Once | INTRAVENOUS | Status: AC
  Administered 2014-10-08: 13:00:00 via INTRAVENOUS

## 2014-10-08 MED ORDER — FLUOROURACIL CHEMO INJECTION 500 MG/10ML
250.0000 mg/m2 | Freq: Once | INTRAVENOUS | Status: AC
  Administered 2014-10-08: 450 mg via INTRAVENOUS
  Filled 2014-10-08: qty 9

## 2014-10-08 MED ORDER — PROCHLORPERAZINE MALEATE 10 MG PO TABS
ORAL_TABLET | ORAL | Status: AC
Start: 2014-10-08 — End: 2014-10-08
  Filled 2014-10-08: qty 1

## 2014-10-08 MED ORDER — LEUCOVORIN CALCIUM INJECTION 350 MG
250.0000 mg/m2 | Freq: Once | INTRAVENOUS | Status: AC
  Administered 2014-10-08: 470 mg via INTRAVENOUS
  Filled 2014-10-08: qty 23.5

## 2014-10-08 MED ORDER — PROCHLORPERAZINE MALEATE 10 MG PO TABS
10.0000 mg | ORAL_TABLET | Freq: Once | ORAL | Status: AC
  Administered 2014-10-08: 10 mg via ORAL

## 2014-10-08 MED ORDER — MAGNESIUM OXIDE 400 (241.3 MG) MG PO TABS: 400.0000 mg | ORAL_TABLET | Freq: Two times a day (BID) | ORAL | Status: DC

## 2014-10-08 NOTE — Progress Notes (Signed)
Call from lab- Magnesium is 1.2. Will make provider aware.

## 2014-10-08 NOTE — Progress Notes (Signed)
Emily Lawson OFFICE PROGRESS NOTE   Diagnosis:  Colon cancer  INTERVAL HISTORY:   Emily Lawson completed cycle 1 5-FU/Panitumumab on 09/10/2014. She subsequently missed several followup visits due to her husband's death. She reports tolerating her first treatment well. No nausea or vomiting. No mouth sores. No diarrhea. No skin rash. She states her appetite is "okay". No pain except in her knees.  Objective:  Vital signs in last 24 hours:  Blood pressure 105/46, pulse 65, temperature 98 F (36.7 C), temperature source Oral, resp. rate 18, height _0  (1.549 m), weight 176 lb 9.6 oz (80.105 kg).    HEENT: no thrush or ulcers. Resp: lungs clear bilaterally. Cardio: regular rate and rhythm. GI: abdomen soft and nontender. No hepatomegaly. Vascular: no leg edema. Calves soft and nontender. Neuro:alert and oriented.  Skin: faint erythematous macular rash over the malar regions bilaterally.    Lab Results:  Lab Results  Component Value Date   WBC 8.6 10/08/2014   HGB 8.7* 10/08/2014   HCT 26.7* 10/08/2014   MCV 103.1* 10/08/2014   PLT 180 10/08/2014   NEUTROABS 6.0 10/08/2014    Imaging:  No results found.  Medications: I have reviewed the patient's current medications.  Assessment/Plan: 1. Metastatic colon cancer presenting with synchronous colon primaries in September, 2008, status post a hemicolectomy and liver biopsy 08/03/2007. A CT on 04/20/2010 revealed no evidence metastatic disease. She was maintained on 5-FU/leucovorin per the FOLFOX regimen beginning in April, 2009. Avasta was discontinued from the chemotherapy regimen beginning in November, 2009 due to nephrotic range proteinuria. A CT of the abdomen and pelvis 02/05/2011 revealed evidence of three small liver lesions. An MRI of the abdomen on 03/02/2011 confirmed 3 liver lesions and no additional evidence of metastatic disease. K-RAS testing on the transverse colon and left colon tumors revealed a  wild type genotype. Foundation 1 testing revealed no K-ras, NRAS, or BRAF mutation. 1A. Staging PET scan 03/21/2011 confirmed 3 hypermetabolic liver lesions and no additional evidence of metastatic disease.  1B. Surgical consultation by Dr. Barry Dienes was obtained and Emily Lawson decided against an attempt at surgical resection/radiofrequency ablation.  1C. Salvage therapy with FOLFOX was initiated 04/13/2011.  1D. A restaging CT 06/28/2011 revealed a significant improvement in the hepatic metastasis.  1E. She completed eight cycles of FOLFOX chemotherapy. Treatment was switched to 5FU-leucovorin as per the FOLFOX regimen beginning with cycle #9 due to an allergic reaction (skin rash) following cycle #8.  60F. Restaging CT 10/25/2011 revealed 2 more conspicuous liver metastases .  1G. Restaging PET scan 12/24/2011 revealed an increase in the size and metabolic activity of 3 liver metastases  1H. She began FOLFIRI chemotherapy on 01/19/2012.  1 I. Restaging CT of the abdomen 05/08/2012 revealed a mixed response with enlargement of 2 liver lesions and a decrease in the size of a third lesion, no new lesions.  1J. FOLFIRI chemotherapy continued with the addition of Avastin beginning 05/09/2012.  1K. restaging CT 07/28/2012 with no new liver lesions and a decreased size of the previously noted 3 liver lesions.  1L. She was last treated with FOLFIRI/Avastin on 09/12/2012.  27M. She completed stereotactic radiotherapy to 3 liver metastases 10/10/2012 through 10/20/2012.  1N. Follow-up MRI of the abdomen 11/24/2012 showed improvement in the 3 hepatic metastases and no progressive disease.  1O. Restaging MRI 04/09/2013 showed improvement in the 3 treated hepatic lesions and 2 new lesions. No evidence of metastatic disease outside of liver.  1P. Initiation of Xeloda/Avastin 05/30/2013.  Cycle 2 initiated on 06/20/2013 (Avastin placed on hold)-Xeloda discontinued after 10 days secondary to hand/foot  syndrome. Cycle 3 Xeloda question initiated on 07/28/2013 (she is unable to recall when she started or stopped cycle 3). Cycle 4 08/20/2013. Cycle 5 09/14/2013 (no Xeloda). Cycle 6 Xeloda/Avastin 10/01/2013. Cycle 7 Xeloda/Avastin 11/02/2013  1Q. restaging MRI of the liver 11/16/2013-3 enhancing right liver lesion, slightly larger. No evidence of metastasis in the left hepatic lobe.  1R. Xeloda/Avastin continued.  1S. restaging PET scan 01/28/2014 showed 2 hypermetabolic lesions in the liver corresponding to the abnormality seen on the recent MRI. The third lesion seen on the recent MRI was not discernible on the PET. No other evidence for hypermetabolic metastases.  Emily Lawson continued.  1U. CT abdomen/pelvis 06/14/2014 with new moderate ascites; large infiltrating lesions again noted within the right hepatic lobe difficult to fully assess on CT. Mildly increased hepatic atrophy with regard to the 2 larger lesions. A smaller lobulated lesion had increased mildly in size. 1V.  CT chest/abdomen 09/09/2014 with progression of multifocal hepatic metastases. No evidence of metastatic disease in the chest. 1W. Initiation of 5-fluorouracil/Panitumumab on a 2 week schedule 09/10/2014.  1. Diabetes. 2. History of proteinuria secondary to Avastin versus diabetes. 3. Mucositis and hand foot syndrome, secondary to 5FU improved with a dose reduction of the 5-FU. 4. Anemia secondary to chemotherapy.  5. Left back and left leg pain with an MRI of the lumbar spine confirming lumbar disk disease. She is status post surgery by Dr. Christella Noa 02/14/2008 with resolution of the back and left leg pain. 6. Rosacea. She is followed by dermatology. A recent flare, now maintained on doxycycline 7. Depression, maintained on Cymbalta-she requested a referral to Dr. Ferdinand Lango 8. History of bilateral knee pain secondary to arthritis -- she takes Vicodin as needed. 9. History of anorexia -- weight loss -- ?Related to  depression/anxiety or metastatic colon cancer. 10. Allergic reaction to oxaliplatin with FOLFOX on 04/13/2011. She was able to tolerate FOLFOX on 04/20/2011 with steroid premedication and a prolonged oxaliplatin infusion. After cycle #8 she developed a rash upon returning home with pruritus. She took Benadryl with resolution of symptoms. This reaction occurred despite steroid premedication. 11. Poor dentition -- she was evaluated by Dr. Enrique Sack. She underwent tooth extractions 07/04/2013. She now has dentures.  12. Neutropenia following cycle 1 of FOLFIRI. The FOLFIRI was dose reduced beginning with cycle 2. She was neutropenic 03/14/2012 and chemotherapy was held for one week.  13. Nausea and diarrhea during the irinotecan infusion on 03/21/2012. Symptoms resolved with atropine. She receives atropine as a premedication with each cycle. 14. Diarrhea following the 05/09/2012 chemotherapy cycle. Likely related to irinotecan. She takes Imodium as needed. 15. Diffuse pain 4-5 days after chemotherapy 07/17/2012-likely toxicity from Neulasta. 16. History of a fall with fracture of the right fifth metacarpal. 17. Hand/foot syndrome secondary to Xeloda-the Xeloda was dose reduced beginning with cycle 3 on 07/23/2013 (?).   Disposition: Emily Lawson appears stable. She has completed one cycle of 5-FU/Panitumumab. Subsequent treatment has been on hold due to her husband's death. Plan to resume 5-FU/Panitumumab as scheduled today. She will return for a followup visit and cycle 3 in 3 weeks (1 week delay due to the Thanksgiving holiday). She will contact the office in the interim with any problems.  Magnesium level is decreased. She will begin magnesium oxide 400 mg twice daily.  Plan reviewed with Dr. Benay Spice.    Ned Card ANP/GNP-BC   10/08/2014  11:55 AM

## 2014-10-08 NOTE — Patient Instructions (Signed)
[facility] Discharge Instructions for Patients Receiving Chemotherapy  Today you received the following chemotherapy agents Fluorouricil, Vectibix, Leucovorin  To help prevent nausea and vomiting after your treatment, we encourage you to take your nausea medication as directed by MD   If you develop nausea and vomiting that is not controlled by your nausea medication, call the clinic.   BELOW ARE SYMPTOMS THAT SHOULD BE REPORTED IMMEDIATELY:  *FEVER GREATER THAN 100.5 F  *CHILLS WITH OR WITHOUT FEVER  NAUSEA AND VOMITING THAT IS NOT CONTROLLED WITH YOUR NAUSEA MEDICATION  *UNUSUAL SHORTNESS OF BREATH  *UNUSUAL BRUISING OR BLEEDING  TENDERNESS IN MOUTH AND THROAT WITH OR WITHOUT PRESENCE OF ULCERS  *URINARY PROBLEMS  *BOWEL PROBLEMS  UNUSUAL RASH Items with * indicate a potential emergency and should be followed up as soon as possible.  Feel free to call the clinic you have any questions or concerns. The clinic phone number is [phone].   Panitumumab Solution for Injection What is this medicine? PANITUMUMAB (pan i TOOM ue mab) is a chemotherapy drug. It targets a specific protein within cancer cells and stops the cells from growing. It is used to treat colorectal cancer. This medicine may be used for other purposes; ask your health care provider or pharmacist if you have questions. COMMON BRAND NAME(S): Vectibix What should I tell my health care provider before I take this medicine? They need to know if you have any of these conditions: -lung disease, especially lung fibrosis -skin conditions or sensitivity -an unusual or allergic reaction to panitumumab, mouse proteins, other medicines, foods, dyes, or preservatives -pregnant or trying to get pregnant -breast-feeding How should I use this medicine? This drug is given as an infusion into a vein. It is administered in a hospital or clinic by a specially trained health care professional. Talk to your  pediatrician regarding the use of this medicine in children. Special care may be needed. Overdosage: If you think you have taken too much of this medicine contact a poison control center or emergency room at once. NOTE: This medicine is only for you. Do not share this medicine with others. What if I miss a dose? It is important not to miss your dose. Call your doctor or health care professional if you are unable to keep an appointment. What may interact with this medicine? -some medicines for cancer This list may not describe all possible interactions. Give your health care provider a list of all the medicines, herbs, non-prescription drugs, or dietary supplements you use. Also tell them if you smoke, drink alcohol, or use illegal drugs. Some items may interact with your medicine. What should I watch for while using this medicine? Visit your doctor for checks on your progress. This drug may make you feel generally unwell. This is not uncommon, as chemotherapy can affect healthy cells as well as cancer cells. Report any side effects. Continue your course of treatment even though you feel ill unless your doctor tells you to stop. This medicine can make you more sensitive to the sun. Keep out of the sun while receiving this medicine and for 2 months after the last dose. If you cannot avoid being in the sun, wear protective clothing and use sunscreen. Do not use sun lamps or tanning beds/booths. In some cases, you may be given additional medicines to help with side effects. Follow all directions for their use. Call your doctor or health care professional for advice if you get a fever, chills or sore throat,  or other symptoms of a cold or flu. Do not treat yourself. This drug decreases your body's ability to fight infections. Try to avoid being around people who are sick. Avoid taking products that contain aspirin, acetaminophen, ibuprofen, naproxen, or ketoprofen unless instructed by your doctor. These  medicines may hide a fever. Do not become pregnant while taking this medicine and for 6 months after the last dose. Women should inform their doctor if they wish to become pregnant or think they might be pregnant. Men should not father a child while taking this medicine and for 6 months after the last dose. There is a potential for serious side effects to an unborn child. Talk to your health care professional or pharmacist for more information. Do not breast-feed an infant while taking this medicine. What side effects may I notice from receiving this medicine? Side effects that you should report to your doctor or health care professional as soon as possible: -allergic reactions like skin rash, itching or hives, swelling of the face, lips, or tongue -breathing problems -changes in vision -fast, irregular heartbeat -feeling faint or lightheaded, falls -fever, chills -mouth sores -swelling of the ankles, feet, hands -unusually weak or tired Side effects that usually do not require medical attention (report to your doctor or health care professional if they continue or are bothersome): -changes in skin like acne, cracks, skin dryness -constipation -diarrhea -eyelash growth -headache -nail changes -nausea, vomiting -stomach upset This list may not describe all possible side effects. Call your doctor for medical advice about side effects. You may report side effects to FDA at 1-800-FDA-1088. Where should I keep my medicine? This drug is given in a hospital or clinic and will not be stored at home. NOTE: This sheet is a summary. It may not cover all possible information. If you have questions about this medicine, talk to your doctor, pharmacist, or health care provider.  2015, Elsevier/Gold Standard. (2011-04-13 11:00:12) Fluorouracil, 5-FU injection What is this medicine? FLUOROURACIL, 5-FU (flure oh YOOR a sil) is a chemotherapy drug. It slows the growth of cancer cells. This medicine is  used to treat many types of cancer like breast cancer, colon or rectal cancer, pancreatic cancer, and stomach cancer. This medicine may be used for other purposes; ask your health care provider or pharmacist if you have questions. COMMON BRAND NAME(S): Adrucil What should I tell my health care provider before I take this medicine? They need to know if you have any of these conditions: -blood disorders -dihydropyrimidine dehydrogenase (DPD) deficiency -infection (especially a virus infection such as chickenpox, cold sores, or herpes) -kidney disease -liver disease -malnourished, poor nutrition -recent or ongoing radiation therapy -an unusual or allergic reaction to fluorouracil, other chemotherapy, other medicines, foods, dyes, or preservatives -pregnant or trying to get pregnant -breast-feeding How should I use this medicine? This drug is given as an infusion or injection into a vein. It is administered in a hospital or clinic by a specially trained health care professional. Talk to your pediatrician regarding the use of this medicine in children. Special care may be needed. Overdosage: If you think you have taken too much of this medicine contact a poison control center or emergency room at once. NOTE: This medicine is only for you. Do not share this medicine with others. What if I miss a dose? It is important not to miss your dose. Call your doctor or health care professional if you are unable to keep an appointment. What may interact with this medicine? -allopurinol -  cimetidine -dapsone -digoxin -hydroxyurea -leucovorin -levamisole -medicines for seizures like ethotoin, fosphenytoin, phenytoin -medicines to increase blood counts like filgrastim, pegfilgrastim, sargramostim -medicines that treat or prevent blood clots like warfarin, enoxaparin, and dalteparin -methotrexate -metronidazole -pyrimethamine -some other chemotherapy drugs like busulfan, cisplatin, estramustine,  vinblastine -trimethoprim -trimetrexate -vaccines Talk to your doctor or health care professional before taking any of these medicines: -acetaminophen -aspirin -ibuprofen -ketoprofen -naproxen This list may not describe all possible interactions. Give your health care provider a list of all the medicines, herbs, non-prescription drugs, or dietary supplements you use. Also tell them if you smoke, drink alcohol, or use illegal drugs. Some items may interact with your medicine. What should I watch for while using this medicine? Visit your doctor for checks on your progress. This drug may make you feel generally unwell. This is not uncommon, as chemotherapy can affect healthy cells as well as cancer cells. Report any side effects. Continue your course of treatment even though you feel ill unless your doctor tells you to stop. In some cases, you may be given additional medicines to help with side effects. Follow all directions for their use. Call your doctor or health care professional for advice if you get a fever, chills or sore throat, or other symptoms of a cold or flu. Do not treat yourself. This drug decreases your body's ability to fight infections. Try to avoid being around people who are sick. This medicine may increase your risk to bruise or bleed. Call your doctor or health care professional if you notice any unusual bleeding. Be careful brushing and flossing your teeth or using a toothpick because you may get an infection or bleed more easily. If you have any dental work done, tell your dentist you are receiving this medicine. Avoid taking products that contain aspirin, acetaminophen, ibuprofen, naproxen, or ketoprofen unless instructed by your doctor. These medicines may hide a fever. Do not become pregnant while taking this medicine. Women should inform their doctor if they wish to become pregnant or think they might be pregnant. There is a potential for serious side effects to an unborn  child. Talk to your health care professional or pharmacist for more information. Do not breast-feed an infant while taking this medicine. Men should inform their doctor if they wish to father a child. This medicine may lower sperm counts. Do not treat diarrhea with over the counter products. Contact your doctor if you have diarrhea that lasts more than 2 days or if it is severe and watery. This medicine can make you more sensitive to the sun. Keep out of the sun. If you cannot avoid being in the sun, wear protective clothing and use sunscreen. Do not use sun lamps or tanning beds/booths. What side effects may I notice from receiving this medicine? Side effects that you should report to your doctor or health care professional as soon as possible: -allergic reactions like skin rash, itching or hives, swelling of the face, lips, or tongue -low blood counts - this medicine may decrease the number of white blood cells, red blood cells and platelets. You may be at increased risk for infections and bleeding. -signs of infection - fever or chills, cough, sore throat, pain or difficulty passing urine -signs of decreased platelets or bleeding - bruising, pinpoint red spots on the skin, black, tarry stools, blood in the urine -signs of decreased red blood cells - unusually weak or tired, fainting spells, lightheadedness -breathing problems -changes in vision -chest pain -mouth sores -nausea and vomiting -  pain, swelling, redness at site where injected -pain, tingling, numbness in the hands or feet -redness, swelling, or sores on hands or feet -stomach pain -unusual bleeding Side effects that usually do not require medical attention (report to your doctor or health care professional if they continue or are bothersome): -changes in finger or toe nails -diarrhea -dry or itchy skin -hair loss -headache -loss of appetite -sensitivity of eyes to the light -stomach upset -unusually teary eyes This list  may not describe all possible side effects. Call your doctor for medical advice about side effects. You may report side effects to FDA at 1-800-FDA-1088. Where should I keep my medicine? This drug is given in a hospital or clinic and will not be stored at home. NOTE: This sheet is a summary. It may not cover all possible information. If you have questions about this medicine, talk to your doctor, pharmacist, or health care provider.  2015, Elsevier/Gold Standard. (2008-03-20 13:53:16) Leucovorin injection What is this medicine? LEUCOVORIN (loo koe VOR in) is used to prevent or treat the harmful effects of some medicines. This medicine is used to treat anemia caused by a low amount of folic acid in the body. It is also used with 5-fluorouracil (5-FU) to treat colon cancer. This medicine may be used for other purposes; ask your health care provider or pharmacist if you have questions. What should I tell my health care provider before I take this medicine? They need to know if you have any of these conditions: -anemia from low levels of vitamin B-12 in the blood -an unusual or allergic reaction to leucovorin, folic acid, other medicines, foods, dyes, or preservatives -pregnant or trying to get pregnant -breast-feeding How should I use this medicine? This medicine is for injection into a muscle or into a vein. It is given by a health care professional in a hospital or clinic setting. Talk to your pediatrician regarding the use of this medicine in children. Special care may be needed. Overdosage: If you think you have taken too much of this medicine contact a poison control center or emergency room at once. NOTE: This medicine is only for you. Do not share this medicine with others. What if I miss a dose? This does not apply. What may interact with this medicine? -capecitabine -fluorouracil -phenobarbital -phenytoin -primidone -trimethoprim-sulfamethoxazole This list may not describe all  possible interactions. Give your health care provider a list of all the medicines, herbs, non-prescription drugs, or dietary supplements you use. Also tell them if you smoke, drink alcohol, or use illegal drugs. Some items may interact with your medicine. What should I watch for while using this medicine? Your condition will be monitored carefully while you are receiving this medicine. This medicine may increase the side effects of 5-fluorouracil, 5-FU. Tell your doctor or health care professional if you have diarrhea or mouth sores that do not get better or that get worse. What side effects may I notice from receiving this medicine? Side effects that you should report to your doctor or health care professional as soon as possible: -allergic reactions like skin rash, itching or hives, swelling of the face, lips, or tongue -breathing problems -fever, infection -mouth sores -unusual bleeding or bruising -unusually weak or tired Side effects that usually do not require medical attention (report to your doctor or health care professional if they continue or are bothersome): -constipation or diarrhea -loss of appetite -nausea, vomiting This list may not describe all possible side effects. Call your doctor for medical advice  about side effects. You may report side effects to FDA at 1-800-FDA-1088. Where should I keep my medicine? This drug is given in a hospital or clinic and will not be stored at home. NOTE: This sheet is a summary. It may not cover all possible information. If you have questions about this medicine, talk to your doctor, pharmacist, or health care provider.  2015, Elsevier/Gold Standard. (2008-05-21 16:50:29)

## 2014-10-09 ENCOUNTER — Telehealth: Payer: Self-pay | Admitting: Nurse Practitioner

## 2014-10-09 NOTE — Telephone Encounter (Signed)
, °

## 2014-10-10 ENCOUNTER — Telehealth: Payer: Self-pay | Admitting: *Deleted

## 2014-10-10 ENCOUNTER — Ambulatory Visit (HOSPITAL_BASED_OUTPATIENT_CLINIC_OR_DEPARTMENT_OTHER): Payer: Commercial Managed Care - HMO

## 2014-10-10 DIAGNOSIS — C787 Secondary malignant neoplasm of liver and intrahepatic bile duct: Secondary | ICD-10-CM

## 2014-10-10 DIAGNOSIS — C188 Malignant neoplasm of overlapping sites of colon: Secondary | ICD-10-CM

## 2014-10-10 DIAGNOSIS — C189 Malignant neoplasm of colon, unspecified: Secondary | ICD-10-CM

## 2014-10-10 MED ORDER — SODIUM CHLORIDE 0.9 % IJ SOLN
10.0000 mL | INTRAMUSCULAR | Status: DC | PRN
  Administered 2014-10-10: 10 mL
  Filled 2014-10-10: qty 10

## 2014-10-10 MED ORDER — HEPARIN SOD (PORK) LOCK FLUSH 100 UNIT/ML IV SOLN
500.0000 [IU] | Freq: Once | INTRAVENOUS | Status: AC | PRN
  Administered 2014-10-10: 500 [IU]
  Filled 2014-10-10: qty 5

## 2014-10-10 NOTE — Telephone Encounter (Signed)
Per staff message and POF I have scheduled appts. Advised scheduler of appts. JMW  

## 2014-10-22 ENCOUNTER — Other Ambulatory Visit: Payer: Self-pay | Admitting: Oncology

## 2014-10-22 DIAGNOSIS — C189 Malignant neoplasm of colon, unspecified: Secondary | ICD-10-CM

## 2014-10-29 ENCOUNTER — Telehealth: Payer: Self-pay | Admitting: Oncology

## 2014-10-29 ENCOUNTER — Telehealth: Payer: Self-pay | Admitting: *Deleted

## 2014-10-29 ENCOUNTER — Ambulatory Visit (HOSPITAL_BASED_OUTPATIENT_CLINIC_OR_DEPARTMENT_OTHER): Payer: Medicare HMO

## 2014-10-29 ENCOUNTER — Ambulatory Visit (HOSPITAL_BASED_OUTPATIENT_CLINIC_OR_DEPARTMENT_OTHER): Payer: Commercial Managed Care - HMO | Admitting: Oncology

## 2014-10-29 ENCOUNTER — Other Ambulatory Visit (HOSPITAL_BASED_OUTPATIENT_CLINIC_OR_DEPARTMENT_OTHER): Payer: Commercial Managed Care - HMO

## 2014-10-29 VITALS — BP 159/61 | HR 99 | Temp 98.4°F | Resp 18 | Ht 61.0 in | Wt 177.5 lb

## 2014-10-29 DIAGNOSIS — Z5111 Encounter for antineoplastic chemotherapy: Secondary | ICD-10-CM

## 2014-10-29 DIAGNOSIS — C189 Malignant neoplasm of colon, unspecified: Secondary | ICD-10-CM

## 2014-10-29 DIAGNOSIS — E119 Type 2 diabetes mellitus without complications: Secondary | ICD-10-CM

## 2014-10-29 DIAGNOSIS — D6481 Anemia due to antineoplastic chemotherapy: Secondary | ICD-10-CM

## 2014-10-29 DIAGNOSIS — C188 Malignant neoplasm of overlapping sites of colon: Secondary | ICD-10-CM

## 2014-10-29 DIAGNOSIS — C787 Secondary malignant neoplasm of liver and intrahepatic bile duct: Secondary | ICD-10-CM

## 2014-10-29 DIAGNOSIS — R0789 Other chest pain: Secondary | ICD-10-CM

## 2014-10-29 DIAGNOSIS — Z5112 Encounter for antineoplastic immunotherapy: Secondary | ICD-10-CM

## 2014-10-29 LAB — COMPREHENSIVE METABOLIC PANEL (CC13)
ALBUMIN: 2.8 g/dL — AB (ref 3.5–5.0)
ALT: 21 U/L (ref 0–55)
AST: 31 U/L (ref 5–34)
Alkaline Phosphatase: 234 U/L — ABNORMAL HIGH (ref 40–150)
Anion Gap: 17 mEq/L — ABNORMAL HIGH (ref 3–11)
BUN: 19.3 mg/dL (ref 7.0–26.0)
CALCIUM: 9.4 mg/dL (ref 8.4–10.4)
CHLORIDE: 102 meq/L (ref 98–109)
CO2: 21 mEq/L — ABNORMAL LOW (ref 22–29)
CREATININE: 1.3 mg/dL — AB (ref 0.6–1.1)
Glucose: 218 mg/dl — ABNORMAL HIGH (ref 70–140)
Potassium: 4 mEq/L (ref 3.5–5.1)
Sodium: 141 mEq/L (ref 136–145)
Total Bilirubin: 1.34 mg/dL — ABNORMAL HIGH (ref 0.20–1.20)
Total Protein: 6.4 g/dL (ref 6.4–8.3)

## 2014-10-29 LAB — CBC WITH DIFFERENTIAL/PLATELET
BASO%: 0.5 % (ref 0.0–2.0)
BASOS ABS: 0.1 10*3/uL (ref 0.0–0.1)
EOS ABS: 0.4 10*3/uL (ref 0.0–0.5)
EOS%: 4.1 % (ref 0.0–7.0)
HEMATOCRIT: 30 % — AB (ref 34.8–46.6)
HGB: 9.7 g/dL — ABNORMAL LOW (ref 11.6–15.9)
LYMPH#: 3.1 10*3/uL (ref 0.9–3.3)
LYMPH%: 28.6 % (ref 14.0–49.7)
MCH: 33.4 pg (ref 25.1–34.0)
MCHC: 32.3 g/dL (ref 31.5–36.0)
MCV: 103.4 fL — AB (ref 79.5–101.0)
MONO#: 0.6 10*3/uL (ref 0.1–0.9)
MONO%: 5.1 % (ref 0.0–14.0)
NEUT%: 61.7 % (ref 38.4–76.8)
NEUTROS ABS: 6.7 10*3/uL — AB (ref 1.5–6.5)
Platelets: 239 10*3/uL (ref 145–400)
RBC: 2.9 10*6/uL — ABNORMAL LOW (ref 3.70–5.45)
RDW: 18.5 % — AB (ref 11.2–14.5)
WBC: 10.9 10*3/uL — ABNORMAL HIGH (ref 3.9–10.3)

## 2014-10-29 LAB — MAGNESIUM (CC13): Magnesium: 1.2 mg/dl — CL (ref 1.5–2.5)

## 2014-10-29 MED ORDER — PROCHLORPERAZINE MALEATE 10 MG PO TABS
ORAL_TABLET | ORAL | Status: AC
  Filled 2014-10-29: qty 1

## 2014-10-29 MED ORDER — SODIUM CHLORIDE 0.9 % IV SOLN
Freq: Once | INTRAVENOUS | Status: AC
  Administered 2014-10-29: 13:00:00 via INTRAVENOUS

## 2014-10-29 MED ORDER — DEXTROSE 5 % IV SOLN
250.0000 mg/m2 | Freq: Once | INTRAVENOUS | Status: AC
  Administered 2014-10-29: 470 mg via INTRAVENOUS
  Filled 2014-10-29: qty 23.5

## 2014-10-29 MED ORDER — PROCHLORPERAZINE MALEATE 10 MG PO TABS
10.0000 mg | ORAL_TABLET | Freq: Once | ORAL | Status: AC
  Administered 2014-10-29: 10 mg via ORAL

## 2014-10-29 MED ORDER — FLUOROURACIL CHEMO INJECTION 500 MG/10ML
250.0000 mg/m2 | Freq: Once | INTRAVENOUS | Status: AC
  Administered 2014-10-29: 450 mg via INTRAVENOUS
  Filled 2014-10-29: qty 9

## 2014-10-29 MED ORDER — FLUOROURACIL CHEMO INJECTION 5 GM/100ML
1500.0000 mg/m2 | INTRAVENOUS | Status: DC
  Administered 2014-10-29: 2800 mg via INTRAVENOUS
  Filled 2014-10-29: qty 56

## 2014-10-29 MED ORDER — SODIUM CHLORIDE 0.9 % IV SOLN
6.0000 mg/kg | Freq: Once | INTRAVENOUS | Status: AC
  Administered 2014-10-29: 500 mg via INTRAVENOUS
  Filled 2014-10-29: qty 25

## 2014-10-29 NOTE — Progress Notes (Signed)
[facility] OFFICE PROGRESS NOTE   Diagnosis: Colon cancer  INTERVAL HISTORY:   Emily Lawson returns as scheduled. She completed another cycle of 5-FU/panitumumab on 10/08/2014. No significant diarrhea. She reports discomfort in the right posterior lateral chest wall where she has noted a "knot ". Her energy level has improved.  Objective:  Vital signs in last 24 hours:  Blood pressure 159/61, pulse 99, temperature 98.4 F (36.9 C), temperature source Oral, resp. rate 18, height _0  (1.549 m), weight 177 lb 8 oz (80.513 kg).    HEENT: No thrush or ulcers Resp: Lungs clear bilaterally Cardio: Regular rate and rhythm GI: No hepatomegaly, no mass Vascular: No leg edema  Skin: Mild erythematous acne type rash over the face, minimal fine rash over the trunk Musculoskeletal: No mass or tenderness at the right posterior lateral chest wall   Portacath/PICC-without erythema  Lab Results:  Lab Results  Component Value Date   WBC 10.9* 10/29/2014   HGB 9.7* 10/29/2014   HCT 30.0* 10/29/2014   MCV 103.4* 10/29/2014   PLT 239 10/29/2014   NEUTROABS 6.7* 10/29/2014     Medications: I have reviewed the patient's current medications.  Assessment/Plan: 1. Metastatic colon cancer presenting with synchronous colon primaries in September, 2008, status post a hemicolectomy and liver biopsy 08/03/2007. A CT on 04/20/2010 revealed no evidence metastatic disease. She was maintained on 5-FU/leucovorin per the FOLFOX regimen beginning in April, 2009. Avasta was discontinued from the chemotherapy regimen beginning in November, 2009 due to nephrotic range proteinuria. A CT of the abdomen and pelvis 02/05/2011 revealed evidence of three small liver lesions. An MRI of the abdomen on 03/02/2011 confirmed 3 liver lesions and no additional evidence of metastatic disease. K-RAS testing on the transverse colon and left colon tumors revealed a wild type genotype. Foundation 1 testing revealed  no K-ras, NRAS, or BRAF mutation. 1A. Staging PET scan 03/21/2011 confirmed 3 hypermetabolic liver lesions and no additional evidence of metastatic disease.  1B. Surgical consultation by Dr. Barry Dienes was obtained and Ms. Pallo decided against an attempt at surgical resection/radiofrequency ablation.  1C. Salvage therapy with FOLFOX was initiated 04/13/2011.  1D. A restaging CT 06/28/2011 revealed a significant improvement in the hepatic metastasis.  1E. She completed eight cycles of FOLFOX chemotherapy. Treatment was switched to 5FU-leucovorin as per the FOLFOX regimen beginning with cycle #9 due to an allergic reaction (skin rash) following cycle #8.  35F. Restaging CT 10/25/2011 revealed 2 more conspicuous liver metastases .  1G. Restaging PET scan 12/24/2011 revealed an increase in the size and metabolic activity of 3 liver metastases  1H. She began FOLFIRI chemotherapy on 01/19/2012.  1 I. Restaging CT of the abdomen 05/08/2012 revealed a mixed response with enlargement of 2 liver lesions and a decrease in the size of a third lesion, no new lesions.  1J. FOLFIRI chemotherapy continued with the addition of Avastin beginning 05/09/2012.  1K. restaging CT 07/28/2012 with no new liver lesions and a decreased size of the previously noted 3 liver lesions.  1L. She was last treated with FOLFIRI/Avastin on 09/12/2012.  19M. She completed stereotactic radiotherapy to 3 liver metastases 10/10/2012 through 10/20/2012.  1N. Follow-up MRI of the abdomen 11/24/2012 showed improvement in the 3 hepatic metastases and no progressive disease.  1O. Restaging MRI 04/09/2013 showed improvement in the 3 treated hepatic lesions and 2 new lesions. No evidence of metastatic disease outside of liver.  1P. Initiation of Xeloda/Avastin 05/30/2013. Cycle 2 initiated on 06/20/2013 (Avastin placed on  hold)-Xeloda discontinued after 10 days secondary to hand/foot syndrome. Cycle 3 Xeloda question initiated on  07/28/2013 (she is unable to recall when she started or stopped cycle 3). Cycle 4 08/20/2013. Cycle 5 09/14/2013 (no Xeloda). Cycle 6 Xeloda/Avastin 10/01/2013. Cycle 7 Xeloda/Avastin 11/02/2013  1Q. restaging MRI of the liver 11/16/2013-3 enhancing right liver lesion, slightly larger. No evidence of metastasis in the left hepatic lobe.  1R. Xeloda/Avastin continued.  1S. restaging PET scan 01/28/2014 showed 2 hypermetabolic lesions in the liver corresponding to the abnormality seen on the recent MRI. The third lesion seen on the recent MRI was not discernible on the PET. No other evidence for hypermetabolic metastases.  [name] continued.  1U. CT abdomen/pelvis 06/14/2014 with new moderate ascites; large infiltrating lesions again noted within the right hepatic lobe difficult to fully assess on CT. Mildly increased hepatic atrophy with regard to the 2 larger lesions. A smaller lobulated lesion had increased mildly in size. 1V. CT chest/abdomen 09/09/2014 with progression of multifocal hepatic metastases. No evidence of metastatic disease in the chest. 1W. Initiation of 5-fluorouracil/Panitumumab on a 2 week schedule 09/10/2014.  1. Diabetes. 2. History of proteinuria secondary to Avastin versus diabetes. 3. Mucositis and hand foot syndrome, secondary to 5FU improved with a dose reduction of the 5-FU. 4. Anemia secondary to chemotherapy.  5. Left back and left leg pain with an MRI of the lumbar spine confirming lumbar disk disease. She is status post surgery by Dr. Christella Noa 02/14/2008 with resolution of the back and left leg pain. 6. Rosacea. She is followed by dermatology.  7. Depression, maintained on Cymbalta 8. History of bilateral knee pain secondary to arthritis -- she takes Vicodin as needed. 9. History of anorexia -- weight loss -- ?Related to depression/anxiety or metastatic colon cancer. 10. Allergic reaction to oxaliplatin with FOLFOX on 04/13/2011. She was able to  tolerate FOLFOX on 04/20/2011 with steroid premedication and a prolonged oxaliplatin infusion. After cycle #8 she developed a rash upon returning home with pruritus. She took Benadryl with resolution of symptoms. This reaction occurred despite steroid premedication. 11. Poor dentition -- she was evaluated by Dr. Enrique Sack. She underwent tooth extractions 07/04/2013. She now has dentures.  12. Neutropenia following cycle 1 of FOLFIRI. The FOLFIRI was dose reduced beginning with cycle 2. She was neutropenic 03/14/2012 and chemotherapy was held for one week.  13. Nausea and diarrhea during the irinotecan infusion on 03/21/2012. Symptoms resolved with atropine. She receives atropine as a premedication with each cycle. 14. Diarrhea following the 05/09/2012 chemotherapy cycle. Likely related to irinotecan. She takes Imodium as needed. 15. History of Diffuse pain 4-5 days after chemotherapy 07/17/2012-likely toxicity from Neulasta. 16. History of a fall with fracture of the right fifth metacarpal. 17. Hand/foot syndrome secondary to Xeloda-the Xeloda was dose reduced beginning with cycle 3 on 07/23/2013 (?).   Disposition:  She has completed 2 treatments with 5-FU/panitumumab. Her performance status appears improved today. The plan is to continue 5-FU/panitumumab on a 2 week schedule. Ms. Foxworthy will return for an office visit and chemotherapy in 2 weeks. The discomfort at the right posterior lateral chest wall is likely related to benign must to skeletal pain. She will contact us if this discomfort progresses.  Betsy Coder, MD  10/29/2014  11:57 AM

## 2014-10-29 NOTE — Telephone Encounter (Signed)
per reply from MW pt needs to be in trmt room by 12:30. Sent email to [place] advise-awaiting reply

## 2014-10-29 NOTE — Patient Instructions (Signed)
[facility] Discharge Instructions for Patients Receiving Chemotherapy  Today you received the following chemotherapy agents: Vectibix, Leucovorin, 5FU, 5FU pump.  To help prevent nausea and vomiting after your treatment, we encourage you to take your nausea medication: compazine 10 mg every 6 hours as needed. Zofran 8mg  every 12 hours as needed.   If you develop nausea and vomiting that is not controlled by your nausea medication, call the clinic.   BELOW ARE SYMPTOMS THAT SHOULD BE REPORTED IMMEDIATELY:  *FEVER GREATER THAN 100.5 F  *CHILLS WITH OR WITHOUT FEVER  NAUSEA AND VOMITING THAT IS NOT CONTROLLED WITH YOUR NAUSEA MEDICATION  *UNUSUAL SHORTNESS OF BREATH  *UNUSUAL BRUISING OR BLEEDING  TENDERNESS IN MOUTH AND THROAT WITH OR WITHOUT PRESENCE OF ULCERS  *URINARY PROBLEMS  *BOWEL PROBLEMS  UNUSUAL RASH Items with * indicate a potential emergency and should be followed up as soon as possible.  Feel free to call the clinic you have any questions or concerns. The clinic phone number is [phone].

## 2014-10-29 NOTE — Telephone Encounter (Signed)
Magnesium results reviewed by Dr. Benay Spice: Pt to resume magnesium at previously prescribed dose. Left message at home # for pt's daughter to call office. Left written instructions with treatment RN for pt to take Mag 400 BID.

## 2014-10-29 NOTE — Telephone Encounter (Signed)
per pof to sch pt appt-sent MW emailt o sch appt-pt to get updated sch 12/3 @ pump appt

## 2014-10-31 ENCOUNTER — Ambulatory Visit (HOSPITAL_BASED_OUTPATIENT_CLINIC_OR_DEPARTMENT_OTHER): Payer: Commercial Managed Care - HMO

## 2014-10-31 ENCOUNTER — Telehealth: Payer: Self-pay | Admitting: Oncology

## 2014-10-31 DIAGNOSIS — C189 Malignant neoplasm of colon, unspecified: Secondary | ICD-10-CM

## 2014-10-31 DIAGNOSIS — C188 Malignant neoplasm of overlapping sites of colon: Secondary | ICD-10-CM

## 2014-10-31 DIAGNOSIS — C787 Secondary malignant neoplasm of liver and intrahepatic bile duct: Secondary | ICD-10-CM

## 2014-10-31 MED ORDER — SODIUM CHLORIDE 0.9 % IJ SOLN
10.0000 mL | INTRAMUSCULAR | Status: DC | PRN
Start: 2014-10-31 — End: 2014-10-31
  Administered 2014-10-31: 10 mL
  Filled 2014-10-31: qty 10

## 2014-10-31 MED ORDER — HEPARIN SOD (PORK) LOCK FLUSH 100 UNIT/ML IV SOLN
500.0000 [IU] | Freq: Once | INTRAVENOUS | Status: AC | PRN
  Administered 2014-10-31: 500 [IU]
  Filled 2014-10-31: qty 5

## 2014-10-31 NOTE — Patient Instructions (Signed)
Fluorouracil, 5FU; Diclofenac topical cream What is this medicine? FLUOROURACIL; DICLOFENAC (flure oh YOOR a sil; dye KLOE fen ak) is a combination of a topical chemotherapy agent and non-steroidal anti-inflammatory drug (NSAID). It is used on the skin to treat skin cancer and skin conditions that could become cancer. This medicine may be used for other purposes; ask your health care provider or pharmacist if you have questions. COMMON BRAND NAME(S): FLUORAC What should I tell my health care provider before I take this medicine? They need to know if you have any of these conditions: -bleeding problems -cigarette smoker -DPD enzyme deficiency -heart disease -high blood pressure -if you frequently drink alcohol containing drinks -kidney disease -liver disease -open or infected skin -stomach problems -swelling or open sores at the treatment site -recent or planned coronary artery bypass graft (CABG) surgery -an unusual or allergic reaction to fluorouracil, diclofenac, aspirin, other NSAIDs, other medicines, foods, dyes, or preservatives -pregnant or trying to get pregnant -breast-feeding How should I use this medicine? This medicine is only for use on the skin. Follow the directions on the prescription label. Wash hands before and after use. Wash affected area and gently pat dry. To apply this medicine use a cotton-tipped applicator, or use gloves if applying with fingertips. If applied with unprotected fingertips, it is very important to wash your hands well after you apply this medicine. Avoid applying to the eyes, nose, or mouth. Apply enough medicine to cover the affected area. You can cover the area with a light gauze dressing, but do not use tight or air-tight dressings. Finish the full course prescribed by your doctor or health care professional, even if you think your condition is better. Do not stop taking except on the advice of your doctor or health care professional. Talk to your  pediatrician regarding the use of this medicine in children. Special care may be needed. Overdosage: If you think you've taken too much of this medicine contact a poison control center or emergency room at once. Overdosage: If you think you have taken too much of this medicine contact a poison control center or emergency room at once. NOTE: This medicine is only for you. Do not share this medicine with others. What if I miss a dose? If you miss a dose, apply it as soon as you can. If it is almost time for your next dose, only use that dose. Do not apply extra doses. Contact your doctor or health care professional if you miss more than one dose. What may interact with this medicine? Interactions are not expected. Do not use any other skin products without telling your doctor or health care professional. This list may not describe all possible interactions. Give your health care provider a list of all the medicines, herbs, non-prescription drugs, or dietary supplements you use. Also tell them if you smoke, drink alcohol, or use illegal drugs. Some items may interact with your medicine. What should I watch for while using this medicine? Visit your doctor or health care professional for checks on your progress. You will need to use this medicine for 2 to 6 weeks. This may be longer depending on the condition being treated. You may not see full healing for another 1 to 2 months after you stop using the medicine. Treated areas of skin can look unsightly during and for several weeks after treatment with this medicine. This medicine can make you more sensitive to the sun. Keep out of the sun. If you cannot avoid being in   the sun, wear protective clothing and use sunscreen. Do not use sun lamps or tanning beds/booths. Where should I keep my What side effects may I notice from receiving this medicine? Side effects that you should report to your doctor or health care professional as soon as possible: -allergic  reactions like skin rash, itching or hives, swelling of the face, lips, or tongue -black or bloody stools, blood in the urine or vomit -blurred vision -chest pain -difficulty breathing or wheezing -redness, blistering, peeling or loosening of the skin, including inside the mouth -severe redness and swelling of normal skin -slurred speech or weakness on one side of the body -trouble passing urine or change in the amount of urine -unexplained weight gain or swelling -unusually weak or tired -yellowing of eyes or skin Side effects that usually do not require medical attention (Report these to your doctor or health care professional if they continue or are bothersome.): -increased sensitivity of the skin to sun and ultraviolet light -pain and burning of the affected area -scaling or swelling of the affected area -skin rash, itching of the affected area -tenderness This list may not describe all possible side effects. Call your doctor for medical advice about side effects. You may report side effects to FDA at 1-800-FDA-1088. Where should I keep my medicine? Keep out of the reach of children. Store at room temperature between 20 and 25 degrees C (68 and 77 degrees F). Throw away any unused medicine after the expiration date. NOTE: This sheet is a summary. It may not cover all possible information. If you have questions about this medicine, talk to your doctor, pharmacist, or health care provider.  2015, [employer]. (2014-03-18 11:09:58)  

## 2014-10-31 NOTE — Telephone Encounter (Signed)
recd response back from [facility] [place] stating pt needs to be in infusion by 12:30-Sent GS email to advise of response-Awaiting reply

## 2014-11-01 ENCOUNTER — Telehealth: Payer: Self-pay | Admitting: Oncology

## 2014-11-01 ENCOUNTER — Telehealth: Payer: Self-pay | Admitting: *Deleted

## 2014-11-01 NOTE — Telephone Encounter (Signed)
Per staff message and POF I have scheduled appts. Advised scheduler of appts. JMW  

## 2014-11-01 NOTE — Telephone Encounter (Signed)
per reply from OL:MBEM: Ladell Pier, MD I will have her in chemo by 1PM, that should be ok -per MW reply it was done & taken care of as is-cld pt & left message of time & date of appt-mailed copy of sch

## 2014-11-08 ENCOUNTER — Telehealth: Payer: Self-pay | Admitting: *Deleted

## 2014-11-08 NOTE — Telephone Encounter (Signed)
Called to report she lost her papers as to when her next appointment was next week. Gave her time/date for lab/chemo on 12/15.

## 2014-11-10 ENCOUNTER — Other Ambulatory Visit: Payer: Self-pay | Admitting: Oncology

## 2014-11-12 ENCOUNTER — Other Ambulatory Visit (HOSPITAL_BASED_OUTPATIENT_CLINIC_OR_DEPARTMENT_OTHER): Payer: Commercial Managed Care - HMO

## 2014-11-12 ENCOUNTER — Ambulatory Visit (HOSPITAL_BASED_OUTPATIENT_CLINIC_OR_DEPARTMENT_OTHER): Payer: Commercial Managed Care - HMO

## 2014-11-12 DIAGNOSIS — Z5111 Encounter for antineoplastic chemotherapy: Secondary | ICD-10-CM

## 2014-11-12 DIAGNOSIS — C188 Malignant neoplasm of overlapping sites of colon: Secondary | ICD-10-CM

## 2014-11-12 DIAGNOSIS — Z5112 Encounter for antineoplastic immunotherapy: Secondary | ICD-10-CM

## 2014-11-12 DIAGNOSIS — C787 Secondary malignant neoplasm of liver and intrahepatic bile duct: Secondary | ICD-10-CM

## 2014-11-12 DIAGNOSIS — C189 Malignant neoplasm of colon, unspecified: Secondary | ICD-10-CM

## 2014-11-12 LAB — CBC WITH DIFFERENTIAL/PLATELET
BASO%: 0.9 % (ref 0.0–2.0)
BASOS ABS: 0.1 10*3/uL (ref 0.0–0.1)
EOS%: 6.2 % (ref 0.0–7.0)
Eosinophils Absolute: 0.5 10*3/uL (ref 0.0–0.5)
HCT: 27.7 % — ABNORMAL LOW (ref 34.8–46.6)
HEMOGLOBIN: 8.8 g/dL — AB (ref 11.6–15.9)
LYMPH#: 1.8 10*3/uL (ref 0.9–3.3)
LYMPH%: 23.4 % (ref 14.0–49.7)
MCH: 33.1 pg (ref 25.1–34.0)
MCHC: 31.8 g/dL (ref 31.5–36.0)
MCV: 103.9 fL — ABNORMAL HIGH (ref 79.5–101.0)
MONO#: 0.5 10*3/uL (ref 0.1–0.9)
MONO%: 6.3 % (ref 0.0–14.0)
NEUT#: 4.9 10*3/uL (ref 1.5–6.5)
NEUT%: 63.2 % (ref 38.4–76.8)
PLATELETS: 198 10*3/uL (ref 145–400)
RBC: 2.67 10*6/uL — ABNORMAL LOW (ref 3.70–5.45)
RDW: 19.1 % — ABNORMAL HIGH (ref 11.2–14.5)
WBC: 7.7 10*3/uL (ref 3.9–10.3)

## 2014-11-12 LAB — COMPREHENSIVE METABOLIC PANEL (CC13)
ALT: 31 U/L (ref 0–55)
ANION GAP: 16 meq/L — AB (ref 3–11)
AST: 36 U/L — ABNORMAL HIGH (ref 5–34)
Albumin: 2.8 g/dL — ABNORMAL LOW (ref 3.5–5.0)
Alkaline Phosphatase: 240 U/L — ABNORMAL HIGH (ref 40–150)
BILIRUBIN TOTAL: 1.26 mg/dL — AB (ref 0.20–1.20)
BUN: 20 mg/dL (ref 7.0–26.0)
CALCIUM: 8.9 mg/dL (ref 8.4–10.4)
CHLORIDE: 104 meq/L (ref 98–109)
CO2: 21 mEq/L — ABNORMAL LOW (ref 22–29)
Creatinine: 1.4 mg/dL — ABNORMAL HIGH (ref 0.6–1.1)
EGFR: 37 mL/min/{1.73_m2} — AB (ref >90)
GLUCOSE: 216 mg/dL — AB (ref 70–140)
Potassium: 4.5 mEq/L (ref 3.5–5.1)
Sodium: 141 mEq/L (ref 136–145)
Total Protein: 6.2 g/dL — ABNORMAL LOW (ref 6.4–8.3)

## 2014-11-12 LAB — MAGNESIUM (CC13): Magnesium: 1.2 mg/dl — CL (ref 1.5–2.5)

## 2014-11-12 MED ORDER — SODIUM CHLORIDE 0.9 % IV SOLN
Freq: Once | INTRAVENOUS | Status: AC
  Administered 2014-11-12: 12:00:00 via INTRAVENOUS

## 2014-11-12 MED ORDER — FLUOROURACIL CHEMO INJECTION 500 MG/10ML
250.0000 mg/m2 | Freq: Once | INTRAVENOUS | Status: AC
  Administered 2014-11-12: 450 mg via INTRAVENOUS
  Filled 2014-11-12: qty 9

## 2014-11-12 MED ORDER — PROCHLORPERAZINE MALEATE 10 MG PO TABS
10.0000 mg | ORAL_TABLET | Freq: Once | ORAL | Status: AC
  Administered 2014-11-12: 10 mg via ORAL

## 2014-11-12 MED ORDER — LEUCOVORIN CALCIUM INJECTION 350 MG
250.0000 mg/m2 | Freq: Once | INTRAVENOUS | Status: AC
  Administered 2014-11-12: 470 mg via INTRAVENOUS
  Filled 2014-11-12: qty 23.5

## 2014-11-12 MED ORDER — PROCHLORPERAZINE MALEATE 10 MG PO TABS
ORAL_TABLET | ORAL | Status: AC
  Filled 2014-11-12: qty 1

## 2014-11-12 MED ORDER — SODIUM CHLORIDE 0.9 % IV SOLN
1500.0000 mg/m2 | INTRAVENOUS | Status: DC
  Administered 2014-11-12: 2800 mg via INTRAVENOUS
  Filled 2014-11-12: qty 56

## 2014-11-12 MED ORDER — SODIUM CHLORIDE 0.9 % IV SOLN
6.0000 mg/kg | Freq: Once | INTRAVENOUS | Status: AC
  Administered 2014-11-12: 500 mg via INTRAVENOUS
  Filled 2014-11-12: qty 25

## 2014-11-12 NOTE — Progress Notes (Signed)
Confirmed with pt's daughter that she is taking Magnesium BID. Verbal and written instructions given to pt to increase magnesium to TID. Daughter Ramona given instructions via telephone. Both voiced understanding.

## 2014-11-12 NOTE — Patient Instructions (Signed)
[facility] Discharge Instructions for Patients Receiving Chemotherapy  Today you received the following chemotherapy agents: Vectibix, Leucovorin and Fluorouracil.  To help prevent nausea and vomiting after your treatment, we encourage you to take your nausea medication: Compazine. Take one every 6 hours as needed.   If you develop nausea and vomiting that is not controlled by your nausea medication, call the clinic.   BELOW ARE SYMPTOMS THAT SHOULD BE REPORTED IMMEDIATELY:  *FEVER GREATER THAN 100.5 F  *CHILLS WITH OR WITHOUT FEVER  NAUSEA AND VOMITING THAT IS NOT CONTROLLED WITH YOUR NAUSEA MEDICATION  *UNUSUAL SHORTNESS OF BREATH  *UNUSUAL BRUISING OR BLEEDING  TENDERNESS IN MOUTH AND THROAT WITH OR WITHOUT PRESENCE OF ULCERS  *URINARY PROBLEMS  *BOWEL PROBLEMS  UNUSUAL RASH Items with * indicate a potential emergency and should be followed up as soon as possible.  Feel free to call the clinic should you have any questions or concerns. The clinic phone number is [phone].  Increase magnesium oxide to three times daily.

## 2014-11-13 LAB — CEA: CEA: 8.3 ng/mL — AB (ref 0.0–5.0)

## 2014-11-14 ENCOUNTER — Ambulatory Visit (HOSPITAL_BASED_OUTPATIENT_CLINIC_OR_DEPARTMENT_OTHER): Payer: Commercial Managed Care - HMO

## 2014-11-14 ENCOUNTER — Telehealth: Payer: Self-pay | Admitting: *Deleted

## 2014-11-14 DIAGNOSIS — C188 Malignant neoplasm of overlapping sites of colon: Secondary | ICD-10-CM

## 2014-11-14 DIAGNOSIS — C787 Secondary malignant neoplasm of liver and intrahepatic bile duct: Secondary | ICD-10-CM

## 2014-11-14 DIAGNOSIS — C189 Malignant neoplasm of colon, unspecified: Secondary | ICD-10-CM

## 2014-11-14 MED ORDER — SODIUM CHLORIDE 0.9 % IJ SOLN
10.0000 mL | INTRAMUSCULAR | Status: DC | PRN
  Administered 2014-11-14: 10 mL
  Filled 2014-11-14: qty 10

## 2014-11-14 MED ORDER — HEPARIN SOD (PORK) LOCK FLUSH 100 UNIT/ML IV SOLN
500.0000 [IU] | Freq: Once | INTRAVENOUS | Status: AC | PRN
  Administered 2014-11-14: 500 [IU]
  Filled 2014-11-14: qty 5

## 2014-11-14 NOTE — Telephone Encounter (Signed)
Notified patient of CEA results. Requested RN remind her what the CEA level means-

## 2014-11-14 NOTE — Telephone Encounter (Signed)
-----   Message from Ladell Pier, MD sent at 11/13/2014  1:47 PM EST ----- Please call patient, Emily Lawson is better, f/u as scheduled

## 2014-11-20 ENCOUNTER — Telehealth: Payer: Self-pay | Admitting: *Deleted

## 2014-11-20 ENCOUNTER — Telehealth: Payer: Self-pay | Admitting: Oncology

## 2014-11-20 NOTE — Telephone Encounter (Signed)
Call from pt requesting to change 12/29 chemo appt to 12/28 so she can attend a dinner program with her children. No available slots in chemo that day. Per Dr. Benay Spice: OK to move office visit and treatment to the following week. Pt made aware. Orders sent to schedulers.

## 2014-11-20 NOTE — Telephone Encounter (Signed)
S/w pt confirming r/s for labs/flush/ov per 12/23 POF to 01/05 w/ML, sent msg to r/s chemo also once completed mailed out sch.... Cherylann Banas

## 2014-11-20 NOTE — Telephone Encounter (Signed)
Per staff message and POF I have scheduled appts. Advised scheduler of appts. JMW  

## 2014-11-26 ENCOUNTER — Other Ambulatory Visit: Payer: Medicare HMO

## 2014-11-26 ENCOUNTER — Ambulatory Visit: Payer: Medicare HMO | Admitting: Oncology

## 2014-11-26 ENCOUNTER — Ambulatory Visit: Payer: Medicare HMO

## 2014-12-02 ENCOUNTER — Other Ambulatory Visit: Payer: Self-pay | Admitting: Oncology

## 2014-12-03 ENCOUNTER — Ambulatory Visit: Payer: PPO

## 2014-12-03 ENCOUNTER — Other Ambulatory Visit (HOSPITAL_BASED_OUTPATIENT_CLINIC_OR_DEPARTMENT_OTHER): Payer: PPO

## 2014-12-03 ENCOUNTER — Telehealth: Payer: Self-pay | Admitting: Oncology

## 2014-12-03 ENCOUNTER — Ambulatory Visit (HOSPITAL_BASED_OUTPATIENT_CLINIC_OR_DEPARTMENT_OTHER): Payer: PPO

## 2014-12-03 ENCOUNTER — Ambulatory Visit (HOSPITAL_BASED_OUTPATIENT_CLINIC_OR_DEPARTMENT_OTHER): Payer: PPO | Admitting: Nurse Practitioner

## 2014-12-03 VITALS — BP 166/62 | HR 75 | Temp 98.2°F | Resp 18 | Ht 61.0 in | Wt 177.2 lb

## 2014-12-03 DIAGNOSIS — C787 Secondary malignant neoplasm of liver and intrahepatic bile duct: Secondary | ICD-10-CM

## 2014-12-03 DIAGNOSIS — C188 Malignant neoplasm of overlapping sites of colon: Secondary | ICD-10-CM

## 2014-12-03 DIAGNOSIS — C189 Malignant neoplasm of colon, unspecified: Secondary | ICD-10-CM

## 2014-12-03 DIAGNOSIS — Z5111 Encounter for antineoplastic chemotherapy: Secondary | ICD-10-CM

## 2014-12-03 DIAGNOSIS — Z95828 Presence of other vascular implants and grafts: Secondary | ICD-10-CM

## 2014-12-03 DIAGNOSIS — D6481 Anemia due to antineoplastic chemotherapy: Secondary | ICD-10-CM

## 2014-12-03 DIAGNOSIS — E119 Type 2 diabetes mellitus without complications: Secondary | ICD-10-CM

## 2014-12-03 LAB — COMPREHENSIVE METABOLIC PANEL (CC13)
ALK PHOS: 250 U/L — AB (ref 40–150)
ALT: 24 U/L (ref 0–55)
ANION GAP: 15 meq/L — AB (ref 3–11)
AST: 30 U/L (ref 5–34)
Albumin: 2.7 g/dL — ABNORMAL LOW (ref 3.5–5.0)
BILIRUBIN TOTAL: 1.24 mg/dL — AB (ref 0.20–1.20)
BUN: 23.5 mg/dL (ref 7.0–26.0)
CO2: 22 mEq/L (ref 22–29)
Calcium: 8.7 mg/dL (ref 8.4–10.4)
Chloride: 103 mEq/L (ref 98–109)
Creatinine: 1.4 mg/dL — ABNORMAL HIGH (ref 0.6–1.1)
EGFR: 36 mL/min/{1.73_m2} — AB (ref >90)
Glucose: 295 mg/dl — ABNORMAL HIGH (ref 70–140)
Potassium: 4.6 mEq/L (ref 3.5–5.1)
Sodium: 140 mEq/L (ref 136–145)
Total Protein: 6.2 g/dL — ABNORMAL LOW (ref 6.4–8.3)

## 2014-12-03 LAB — CBC WITH DIFFERENTIAL/PLATELET
BASO%: 1.1 % (ref 0.0–2.0)
BASOS ABS: 0.1 10*3/uL (ref 0.0–0.1)
EOS ABS: 0.3 10*3/uL (ref 0.0–0.5)
EOS%: 3.8 % (ref 0.0–7.0)
HCT: 26.4 % — ABNORMAL LOW (ref 34.8–46.6)
HEMOGLOBIN: 8.5 g/dL — AB (ref 11.6–15.9)
LYMPH%: 21.4 % (ref 14.0–49.7)
MCH: 33.4 pg (ref 25.1–34.0)
MCHC: 32.1 g/dL (ref 31.5–36.0)
MCV: 104.2 fL — AB (ref 79.5–101.0)
MONO#: 0.5 10*3/uL (ref 0.1–0.9)
MONO%: 6.8 % (ref 0.0–14.0)
NEUT%: 66.9 % (ref 38.4–76.8)
NEUTROS ABS: 5.2 10*3/uL (ref 1.5–6.5)
Platelets: 223 10*3/uL (ref 145–400)
RBC: 2.53 10*6/uL — ABNORMAL LOW (ref 3.70–5.45)
RDW: 19.8 % — AB (ref 11.2–14.5)
WBC: 7.8 10*3/uL (ref 3.9–10.3)
lymph#: 1.7 10*3/uL (ref 0.9–3.3)

## 2014-12-03 LAB — MAGNESIUM (CC13): MAGNESIUM: 1.2 mg/dL — AB (ref 1.5–2.5)

## 2014-12-03 MED ORDER — FLUOROURACIL CHEMO INJECTION 500 MG/10ML
250.0000 mg/m2 | Freq: Once | INTRAVENOUS | Status: AC
  Administered 2014-12-03: 450 mg via INTRAVENOUS
  Filled 2014-12-03: qty 9

## 2014-12-03 MED ORDER — SODIUM CHLORIDE 0.9 % IV SOLN
1500.0000 mg/m2 | INTRAVENOUS | Status: DC
  Administered 2014-12-03: 2800 mg via INTRAVENOUS
  Filled 2014-12-03: qty 56

## 2014-12-03 MED ORDER — LEUCOVORIN CALCIUM INJECTION 350 MG
250.0000 mg/m2 | Freq: Once | INTRAVENOUS | Status: AC
  Administered 2014-12-03: 470 mg via INTRAVENOUS
  Filled 2014-12-03: qty 23.5

## 2014-12-03 MED ORDER — SODIUM CHLORIDE 0.9 % IV SOLN
Freq: Once | INTRAVENOUS | Status: AC
  Administered 2014-12-03: 14:00:00 via INTRAVENOUS

## 2014-12-03 MED ORDER — PROCHLORPERAZINE MALEATE 10 MG PO TABS
10.0000 mg | ORAL_TABLET | Freq: Once | ORAL | Status: AC
  Administered 2014-12-03: 10 mg via ORAL

## 2014-12-03 MED ORDER — PROCHLORPERAZINE MALEATE 10 MG PO TABS
ORAL_TABLET | ORAL | Status: AC
  Filled 2014-12-03: qty 1

## 2014-12-03 MED ORDER — SODIUM CHLORIDE 0.9 % IV SOLN
6.0000 mg/kg | Freq: Once | INTRAVENOUS | Status: AC
  Administered 2014-12-03: 500 mg via INTRAVENOUS
  Filled 2014-12-03: qty 25

## 2014-12-03 MED ORDER — SODIUM CHLORIDE 0.9 % IJ SOLN
10.0000 mL | INTRAMUSCULAR | Status: DC | PRN
  Administered 2014-12-03: 10 mL via INTRAVENOUS
  Filled 2014-12-03: qty 10

## 2014-12-03 NOTE — Progress Notes (Signed)
[facility] OFFICE PROGRESS NOTE   Diagnosis:  Colon cancer  INTERVAL HISTORY:   Emily Lawson returns as scheduled. She completed cycle 4 5-FU/Panitumumab on 11/12/2014. She has occasional mild nausea. No vomiting. No mouth sores. No significant diarrhea. She continues to note intermittent bruising over the forearms. Appetite "comes and goes". Energy level is better.  Objective:  Vital signs in last 24 hours:  Blood pressure 166/62, pulse 75, temperature 98.2 F (36.8 C), temperature source Oral, resp. rate 18, height 5' 1"  (1.549 m), weight 177 lb 3.2 oz (80.377 kg), SpO2 100 %.    HEENT: No thrush or ulcers. Resp: Lungs clear bilaterally. Cardio: Regular rate and rhythm. GI: Abdomen soft and nontender. No hepatomegaly. Vascular: 1+ pitting edema at the lower legs bilaterally. Neuro: Alert and oriented.  Skin: Ecchymoses scattered over bilateral forearms. Port-A-Cath without erythema.    Lab Results:  Lab Results  Component Value Date   WBC 7.8 12/03/2014   HGB 8.5* 12/03/2014   HCT 26.4* 12/03/2014   MCV 104.2* 12/03/2014   PLT 223 12/03/2014   NEUTROABS 5.2 12/03/2014    Imaging:  No results found.  Medications: I have reviewed the patient's current medications.  Assessment/Plan: 1. Metastatic colon cancer presenting with synchronous colon primaries in September, 2008, status post a hemicolectomy and liver biopsy 08/03/2007. A CT on 04/20/2010 revealed no evidence metastatic disease. She was maintained on 5-FU/leucovorin per the FOLFOX regimen beginning in April, 2009. Avasta was discontinued from the chemotherapy regimen beginning in November, 2009 due to nephrotic range proteinuria. A CT of the abdomen and pelvis 02/05/2011 revealed evidence of three small liver lesions. An MRI of the abdomen on 03/02/2011 confirmed 3 liver lesions and no additional evidence of metastatic disease. K-RAS testing on the transverse colon and left colon tumors revealed a  wild type genotype. Foundation 1 testing revealed no K-ras, NRAS, or BRAF mutation. 1A. Staging PET scan 03/21/2011 confirmed 3 hypermetabolic liver lesions and no additional evidence of metastatic disease.  1B. Surgical consultation by Dr. Barry Dienes was obtained and Ms. Kirksey decided against an attempt at surgical resection/radiofrequency ablation.  1C. Salvage therapy with FOLFOX was initiated 04/13/2011.  1D. A restaging CT 06/28/2011 revealed a significant improvement in the hepatic metastasis.  1E. She completed eight cycles of FOLFOX chemotherapy. Treatment was switched to 5FU-leucovorin as per the FOLFOX regimen beginning with cycle #9 due to an allergic reaction (skin rash) following cycle #8.  43F. Restaging CT 10/25/2011 revealed 2 more conspicuous liver metastases .  1G. Restaging PET scan 12/24/2011 revealed an increase in the size and metabolic activity of 3 liver metastases  1H. She began FOLFIRI chemotherapy on 01/19/2012.  1 I. Restaging CT of the abdomen 05/08/2012 revealed a mixed response with enlargement of 2 liver lesions and a decrease in the size of a third lesion, no new lesions.  1J. FOLFIRI chemotherapy continued with the addition of Avastin beginning 05/09/2012.  1K. restaging CT 07/28/2012 with no new liver lesions and a decreased size of the previously noted 3 liver lesions.  1L. She was last treated with FOLFIRI/Avastin on 09/12/2012.  39M. She completed stereotactic radiotherapy to 3 liver metastases 10/10/2012 through 10/20/2012.  1N. Follow-up MRI of the abdomen 11/24/2012 showed improvement in the 3 hepatic metastases and no progressive disease.  1O. Restaging MRI 04/09/2013 showed improvement in the 3 treated hepatic lesions and 2 new lesions. No evidence of metastatic disease outside of liver.  1P. Initiation of Xeloda/Avastin 05/30/2013. Cycle 2 initiated on  06/20/2013 (Avastin placed on hold)-Xeloda discontinued after 10 days secondary to hand/foot  syndrome. Cycle 3 Xeloda question initiated on 07/28/2013 (she is unable to recall when she started or stopped cycle 3). Cycle 4 08/20/2013. Cycle 5 09/14/2013 (no Xeloda). Cycle 6 Xeloda/Avastin 10/01/2013. Cycle 7 Xeloda/Avastin 11/02/2013  1Q. restaging MRI of the liver 11/16/2013-3 enhancing right liver lesion, slightly larger. No evidence of metastasis in the left hepatic lobe.  1R. Xeloda/Avastin continued.  1S. restaging PET scan 01/28/2014 showed 2 hypermetabolic lesions in the liver corresponding to the abnormality seen on the recent MRI. The third lesion seen on the recent MRI was not discernible on the PET. No other evidence for hypermetabolic metastases.  [name] continued.  1U. CT abdomen/pelvis 06/14/2014 with new moderate ascites; large infiltrating lesions again noted within the right hepatic lobe difficult to fully assess on CT. Mildly increased hepatic atrophy with regard to the 2 larger lesions. A smaller lobulated lesion had increased mildly in size. 1V. CT chest/abdomen 09/09/2014 with progression of multifocal hepatic metastases. No evidence of metastatic disease in the chest. 1W. Initiation of 5-fluorouracil/Panitumumab on a 2 week schedule 09/10/2014.  1. Diabetes. 2. History of proteinuria secondary to Avastin versus diabetes. 3. Mucositis and hand foot syndrome, secondary to 5FU improved with a dose reduction of the 5-FU. 4. Anemia secondary to chemotherapy.  5. Left back and left leg pain with an MRI of the lumbar spine confirming lumbar disk disease. She is status post surgery by Dr. Christella Noa 02/14/2008 with resolution of the back and left leg pain. 6. Rosacea. She is followed by dermatology.  7. Depression, maintained on Cymbalta 8. History of bilateral knee pain secondary to arthritis -- she takes Vicodin as needed. 9. History of anorexia -- weight loss -- ?Related to depression/anxiety or metastatic colon cancer. 10. Allergic reaction to  oxaliplatin with FOLFOX on 04/13/2011. She was able to tolerate FOLFOX on 04/20/2011 with steroid premedication and a prolonged oxaliplatin infusion. After cycle #8 she developed a rash upon returning home with pruritus. She took Benadryl with resolution of symptoms. This reaction occurred despite steroid premedication. 11. Poor dentition -- she was evaluated by Dr. Enrique Sack. She underwent tooth extractions 07/04/2013. She now has dentures.  12. Neutropenia following cycle 1 of FOLFIRI. The FOLFIRI was dose reduced beginning with cycle 2. She was neutropenic 03/14/2012 and chemotherapy was held for one week.  13. Nausea and diarrhea during the irinotecan infusion on 03/21/2012. Symptoms resolved with atropine. She receives atropine as a premedication with each cycle. 14. Diarrhea following the 05/09/2012 chemotherapy cycle. Likely related to irinotecan. She takes Imodium as needed. 15. History of Diffuse pain 4-5 days after chemotherapy 07/17/2012-likely toxicity from Neulasta. 16. History of a fall with fracture of the right fifth metacarpal. 17. Hand/foot syndrome secondary to Xeloda-the Xeloda was dose reduced beginning with cycle 3 on 07/23/2013 (?).   Disposition: Ms. Carbonell has completed 4 cycles of 5-FU/Panitumumab. Her performance status and the CEA are better. Plan to continue every 2 week 5-FU/Panitumumab. We will see her in follow-up in 4 weeks. She will contact the office in the interim with any problems.    Ned Card ANP/GNP-BC   12/03/2014  12:58 PM

## 2014-12-03 NOTE — Patient Instructions (Signed)
[facility] Discharge Instructions for Patients Receiving Chemotherapy  Today you received the following chemotherapy agents Vectibix and Leucovorin and 5FU.  To help prevent nausea and vomiting after your treatment, we encourage you to take your nausea medication.   If you develop nausea and vomiting that is not controlled by your nausea medication, call the clinic.   BELOW ARE SYMPTOMS THAT SHOULD BE REPORTED IMMEDIATELY:  *FEVER GREATER THAN 100.5 F  *CHILLS WITH OR WITHOUT FEVER  NAUSEA AND VOMITING THAT IS NOT CONTROLLED WITH YOUR NAUSEA MEDICATION  *UNUSUAL SHORTNESS OF BREATH  *UNUSUAL BRUISING OR BLEEDING  TENDERNESS IN MOUTH AND THROAT WITH OR WITHOUT PRESENCE OF ULCERS  *URINARY PROBLEMS  *BOWEL PROBLEMS  UNUSUAL RASH Items with * indicate a potential emergency and should be followed up as soon as possible.  Feel free to call the clinic you have any questions or concerns. The clinic phone number is [phone].

## 2014-12-03 NOTE — Patient Instructions (Signed)

## 2014-12-03 NOTE — Telephone Encounter (Signed)
gv and printed appt sched and avs for pt for Jan adn Feb 2016

## 2014-12-04 ENCOUNTER — Telehealth: Payer: Self-pay | Admitting: *Deleted

## 2014-12-04 MED ORDER — MAGNESIUM OXIDE 400 MG PO TABS: 800.0000 mg | ORAL_TABLET | Freq: Two times a day (BID) | ORAL | Status: DC

## 2014-12-04 NOTE — Telephone Encounter (Signed)
Called pt, she is unsure if she is taking Magnesium. Left message on voicemail for daughter, Jamal Maes to call office to discuss Magnesium instructions.

## 2014-12-04 NOTE — Addendum Note (Signed)
Addended by: Rosalio Macadamia C on: 12/04/2014 01:56 PM   Modules accepted: Orders, Medications

## 2014-12-04 NOTE — Telephone Encounter (Signed)
-----   Message from Ladell Pier, MD sent at 12/03/2014  7:24 PM EST ----- Please call patient, increase magnesium to 800mg  bid

## 2014-12-04 NOTE — Telephone Encounter (Signed)
Spoke with pt's daughter Jamal Maes, she confirms pt has been taking Magnesium 800 mg QAM 400 mg QPM. Instructed her to increase Magnesium to 800 mg BID. She voiced understanding. Requested Rx to be sent to pharmacy. Rx sent electronically.

## 2014-12-05 ENCOUNTER — Ambulatory Visit (HOSPITAL_BASED_OUTPATIENT_CLINIC_OR_DEPARTMENT_OTHER): Payer: PPO

## 2014-12-05 DIAGNOSIS — C188 Malignant neoplasm of overlapping sites of colon: Secondary | ICD-10-CM

## 2014-12-05 DIAGNOSIS — C189 Malignant neoplasm of colon, unspecified: Secondary | ICD-10-CM

## 2014-12-05 DIAGNOSIS — Z452 Encounter for adjustment and management of vascular access device: Secondary | ICD-10-CM

## 2014-12-05 DIAGNOSIS — C787 Secondary malignant neoplasm of liver and intrahepatic bile duct: Secondary | ICD-10-CM

## 2014-12-05 DIAGNOSIS — C187 Malignant neoplasm of sigmoid colon: Secondary | ICD-10-CM

## 2014-12-05 MED ORDER — HEPARIN SOD (PORK) LOCK FLUSH 100 UNIT/ML IV SOLN
500.0000 [IU] | Freq: Once | INTRAVENOUS | Status: AC | PRN
  Administered 2014-12-05: 500 [IU]
  Filled 2014-12-05: qty 5

## 2014-12-05 MED ORDER — SODIUM CHLORIDE 0.9 % IJ SOLN
10.0000 mL | INTRAMUSCULAR | Status: DC | PRN
  Administered 2014-12-05: 10 mL
  Filled 2014-12-05: qty 10

## 2014-12-05 NOTE — Progress Notes (Signed)
1310-Pt here for pump d/c.  Pt states that pump started beeping at approximately 0915 this AM.  Pt stopped pump infusion.  Pt now here with 6 hours left on pump infusion. Pt does not want to return in 6 hours when pump is complete and wants pump d/c'd now.  Dr. Benay Spice notified of 5FU that remains and order received to stop 5FU pump now.  Pt received 179.5 ml and has 27 ml left.  Pt has no questions at this time.

## 2014-12-05 NOTE — Patient Instructions (Signed)

## 2014-12-15 ENCOUNTER — Other Ambulatory Visit: Payer: Self-pay | Admitting: Oncology

## 2014-12-17 ENCOUNTER — Other Ambulatory Visit (HOSPITAL_BASED_OUTPATIENT_CLINIC_OR_DEPARTMENT_OTHER): Payer: PPO

## 2014-12-17 ENCOUNTER — Ambulatory Visit (HOSPITAL_BASED_OUTPATIENT_CLINIC_OR_DEPARTMENT_OTHER): Payer: PPO

## 2014-12-17 DIAGNOSIS — C189 Malignant neoplasm of colon, unspecified: Secondary | ICD-10-CM

## 2014-12-17 DIAGNOSIS — C787 Secondary malignant neoplasm of liver and intrahepatic bile duct: Secondary | ICD-10-CM

## 2014-12-17 DIAGNOSIS — C188 Malignant neoplasm of overlapping sites of colon: Secondary | ICD-10-CM | POA: Diagnosis not present

## 2014-12-17 DIAGNOSIS — Z5112 Encounter for antineoplastic immunotherapy: Secondary | ICD-10-CM

## 2014-12-17 DIAGNOSIS — Z5111 Encounter for antineoplastic chemotherapy: Secondary | ICD-10-CM | POA: Diagnosis not present

## 2014-12-17 LAB — COMPREHENSIVE METABOLIC PANEL (CC13)
ALBUMIN: 2.7 g/dL — AB (ref 3.5–5.0)
ALK PHOS: 263 U/L — AB (ref 40–150)
ALT: 25 U/L (ref 0–55)
AST: 46 U/L — ABNORMAL HIGH (ref 5–34)
Anion Gap: 11 mEq/L (ref 3–11)
BUN: 18.2 mg/dL (ref 7.0–26.0)
CALCIUM: 8.7 mg/dL (ref 8.4–10.4)
CO2: 23 mEq/L (ref 22–29)
Chloride: 103 mEq/L (ref 98–109)
Creatinine: 1.4 mg/dL — ABNORMAL HIGH (ref 0.6–1.1)
EGFR: 37 mL/min/{1.73_m2} — ABNORMAL LOW (ref >90)
Glucose: 201 mg/dl — ABNORMAL HIGH (ref 70–140)
POTASSIUM: 4.2 meq/L (ref 3.5–5.1)
Sodium: 138 mEq/L (ref 136–145)
TOTAL PROTEIN: 6.1 g/dL — AB (ref 6.4–8.3)
Total Bilirubin: 1.29 mg/dL — ABNORMAL HIGH (ref 0.20–1.20)

## 2014-12-17 LAB — CBC WITH DIFFERENTIAL/PLATELET
BASO%: 0.7 % (ref 0.0–2.0)
Basophils Absolute: 0.1 10*3/uL (ref 0.0–0.1)
EOS ABS: 0.5 10*3/uL (ref 0.0–0.5)
EOS%: 6.7 % (ref 0.0–7.0)
HEMATOCRIT: 25.9 % — AB (ref 34.8–46.6)
HGB: 8.2 g/dL — ABNORMAL LOW (ref 11.6–15.9)
LYMPH#: 1.7 10*3/uL (ref 0.9–3.3)
LYMPH%: 22.2 % (ref 14.0–49.7)
MCH: 33 pg (ref 25.1–34.0)
MCHC: 31.6 g/dL (ref 31.5–36.0)
MCV: 104.1 fL — AB (ref 79.5–101.0)
MONO#: 0.5 10*3/uL (ref 0.1–0.9)
MONO%: 6.4 % (ref 0.0–14.0)
NEUT%: 64 % (ref 38.4–76.8)
NEUTROS ABS: 5 10*3/uL (ref 1.5–6.5)
Platelets: 183 10*3/uL (ref 145–400)
RBC: 2.48 10*6/uL — AB (ref 3.70–5.45)
RDW: 19.4 % — AB (ref 11.2–14.5)
WBC: 7.8 10*3/uL (ref 3.9–10.3)

## 2014-12-17 LAB — MAGNESIUM (CC13): Magnesium: 1.3 mg/dl — CL (ref 1.5–2.5)

## 2014-12-17 LAB — CEA: CEA: 9.6 ng/mL — ABNORMAL HIGH (ref 0.0–5.0)

## 2014-12-17 MED ORDER — LEUCOVORIN CALCIUM INJECTION 350 MG
250.0000 mg/m2 | Freq: Once | INTRAVENOUS | Status: AC
  Administered 2014-12-17: 470 mg via INTRAVENOUS
  Filled 2014-12-17: qty 23.5

## 2014-12-17 MED ORDER — SODIUM CHLORIDE 0.9 % IV SOLN
Freq: Once | INTRAVENOUS | Status: AC
  Administered 2014-12-17: 11:00:00 via INTRAVENOUS

## 2014-12-17 MED ORDER — PROCHLORPERAZINE MALEATE 10 MG PO TABS
10.0000 mg | ORAL_TABLET | Freq: Once | ORAL | Status: AC
  Administered 2014-12-17: 10 mg via ORAL

## 2014-12-17 MED ORDER — SODIUM CHLORIDE 0.9 % IV SOLN
6.0000 mg/kg | Freq: Once | INTRAVENOUS | Status: AC
  Administered 2014-12-17: 500 mg via INTRAVENOUS
  Filled 2014-12-17: qty 25

## 2014-12-17 MED ORDER — PROCHLORPERAZINE MALEATE 10 MG PO TABS
ORAL_TABLET | ORAL | Status: AC
Start: 2014-12-17 — End: 2014-12-17
  Filled 2014-12-17: qty 1

## 2014-12-17 MED ORDER — FLUOROURACIL CHEMO INJECTION 500 MG/10ML
250.0000 mg/m2 | Freq: Once | INTRAVENOUS | Status: AC
  Administered 2014-12-17: 450 mg via INTRAVENOUS
  Filled 2014-12-17: qty 9

## 2014-12-17 MED ORDER — FLUOROURACIL CHEMO INJECTION 5 GM/100ML
1500.0000 mg/m2 | INTRAVENOUS | Status: DC
  Administered 2014-12-17: 2800 mg via INTRAVENOUS
  Filled 2014-12-17: qty 56

## 2014-12-17 NOTE — Progress Notes (Signed)
Ok to treat with CBC/CMET results per Dr. Benay Spice.

## 2014-12-17 NOTE — Patient Instructions (Signed)
[facility] Discharge Instructions for Patients Receiving Chemotherapy  Today you received the following chemotherapy agents Leucovorin/5-Fu/Vectibix.   To help prevent nausea and vomiting after your treatment, we encourage you to take your nausea medication as directed.    If you develop nausea and vomiting that is not controlled by your nausea medication, call the clinic.   BELOW ARE SYMPTOMS THAT SHOULD BE REPORTED IMMEDIATELY:  *FEVER GREATER THAN 100.5 F  *CHILLS WITH OR WITHOUT FEVER  NAUSEA AND VOMITING THAT IS NOT CONTROLLED WITH YOUR NAUSEA MEDICATION  *UNUSUAL SHORTNESS OF BREATH  *UNUSUAL BRUISING OR BLEEDING  TENDERNESS IN MOUTH AND THROAT WITH OR WITHOUT PRESENCE OF ULCERS  *URINARY PROBLEMS  *BOWEL PROBLEMS  UNUSUAL RASH Items with * indicate a potential emergency and should be followed up as soon as possible.  Feel free to call the clinic you have any questions or concerns. The clinic phone number is [phone].

## 2014-12-18 ENCOUNTER — Telehealth: Payer: Self-pay

## 2014-12-18 NOTE — Telephone Encounter (Signed)
-----   Message from Owens Shark, NP sent at 12/18/2014  9:21 AM EST ----- Please verify her current magnesium dose.

## 2014-12-18 NOTE — Telephone Encounter (Signed)
Pt taking 2 pills (800mg  total) twice daily. Informed Ned Card, NP.

## 2014-12-19 ENCOUNTER — Encounter: Payer: Self-pay | Admitting: *Deleted

## 2014-12-19 ENCOUNTER — Ambulatory Visit (HOSPITAL_BASED_OUTPATIENT_CLINIC_OR_DEPARTMENT_OTHER): Payer: PPO

## 2014-12-19 DIAGNOSIS — C787 Secondary malignant neoplasm of liver and intrahepatic bile duct: Secondary | ICD-10-CM

## 2014-12-19 DIAGNOSIS — C188 Malignant neoplasm of overlapping sites of colon: Secondary | ICD-10-CM

## 2014-12-19 DIAGNOSIS — Z452 Encounter for adjustment and management of vascular access device: Secondary | ICD-10-CM

## 2014-12-19 DIAGNOSIS — C189 Malignant neoplasm of colon, unspecified: Secondary | ICD-10-CM

## 2014-12-19 MED ORDER — SODIUM CHLORIDE 0.9 % IJ SOLN
10.0000 mL | INTRAMUSCULAR | Status: DC | PRN
  Administered 2014-12-19: 10 mL
  Filled 2014-12-19: qty 10

## 2014-12-19 MED ORDER — HEPARIN SOD (PORK) LOCK FLUSH 100 UNIT/ML IV SOLN
500.0000 [IU] | Freq: Once | INTRAVENOUS | Status: AC | PRN
  Administered 2014-12-19: 500 [IU]
  Filled 2014-12-19: qty 5

## 2014-12-19 NOTE — Patient Instructions (Signed)
Fluorouracil, 5FU; Diclofenac topical cream What is this medicine? FLUOROURACIL; DICLOFENAC (flure oh YOOR a sil; dye KLOE fen ak) is a combination of a topical chemotherapy agent and non-steroidal anti-inflammatory drug (NSAID). It is used on the skin to treat skin cancer and skin conditions that could become cancer. This medicine may be used for other purposes; ask your health care provider or pharmacist if you have questions. COMMON BRAND NAME(S): FLUORAC What should I tell my health care provider before I take this medicine? They need to know if you have any of these conditions: -bleeding problems -cigarette smoker -DPD enzyme deficiency -heart disease -high blood pressure -if you frequently drink alcohol containing drinks -kidney disease -liver disease -open or infected skin -stomach problems -swelling or open sores at the treatment site -recent or planned coronary artery bypass graft (CABG) surgery -an unusual or allergic reaction to fluorouracil, diclofenac, aspirin, other NSAIDs, other medicines, foods, dyes, or preservatives -pregnant or trying to get pregnant -breast-feeding How should I use this medicine? This medicine is only for use on the skin. Follow the directions on the prescription label. Wash hands before and after use. Wash affected area and gently pat dry. To apply this medicine use a cotton-tipped applicator, or use gloves if applying with fingertips. If applied with unprotected fingertips, it is very important to wash your hands well after you apply this medicine. Avoid applying to the eyes, nose, or mouth. Apply enough medicine to cover the affected area. You can cover the area with a light gauze dressing, but do not use tight or air-tight dressings. Finish the full course prescribed by your doctor or health care professional, even if you think your condition is better. Do not stop taking except on the advice of your doctor or health care professional. Talk to your  pediatrician regarding the use of this medicine in children. Special care may be needed. Overdosage: If you think you've taken too much of this medicine contact a poison control center or emergency room at once. Overdosage: If you think you have taken too much of this medicine contact a poison control center or emergency room at once. NOTE: This medicine is only for you. Do not share this medicine with others. What if I miss a dose? If you miss a dose, apply it as soon as you can. If it is almost time for your next dose, only use that dose. Do not apply extra doses. Contact your doctor or health care professional if you miss more than one dose. What may interact with this medicine? Interactions are not expected. Do not use any other skin products without telling your doctor or health care professional. This list may not describe all possible interactions. Give your health care provider a list of all the medicines, herbs, non-prescription drugs, or dietary supplements you use. Also tell them if you smoke, drink alcohol, or use illegal drugs. Some items may interact with your medicine. What should I watch for while using this medicine? Visit your doctor or health care professional for checks on your progress. You will need to use this medicine for 2 to 6 weeks. This may be longer depending on the condition being treated. You may not see full healing for another 1 to 2 months after you stop using the medicine. Treated areas of skin can look unsightly during and for several weeks after treatment with this medicine. This medicine can make you more sensitive to the sun. Keep out of the sun. If you cannot avoid being in   the sun, wear protective clothing and use sunscreen. Do not use sun lamps or tanning beds/booths. Where should I keep my What side effects may I notice from receiving this medicine? Side effects that you should report to your doctor or health care professional as soon as possible: -allergic  reactions like skin rash, itching or hives, swelling of the face, lips, or tongue -black or bloody stools, blood in the urine or vomit -blurred vision -chest pain -difficulty breathing or wheezing -redness, blistering, peeling or loosening of the skin, including inside the mouth -severe redness and swelling of normal skin -slurred speech or weakness on one side of the body -trouble passing urine or change in the amount of urine -unexplained weight gain or swelling -unusually weak or tired -yellowing of eyes or skin Side effects that usually do not require medical attention (Report these to your doctor or health care professional if they continue or are bothersome.): -increased sensitivity of the skin to sun and ultraviolet light -pain and burning of the affected area -scaling or swelling of the affected area -skin rash, itching of the affected area -tenderness This list may not describe all possible side effects. Call your doctor for medical advice about side effects. You may report side effects to FDA at 1-800-FDA-1088. Where should I keep my medicine? Keep out of the reach of children. Store at room temperature between 20 and 25 degrees C (68 and 77 degrees F). Throw away any unused medicine after the expiration date. NOTE: This sheet is a summary. It may not cover all possible information. If you have questions about this medicine, talk to your doctor, pharmacist, or health care provider.  2015, [employer]. (2014-03-18 11:09:58)  

## 2014-12-19 NOTE — Progress Notes (Signed)
MD review of Cmet/Mg+ results from 12/17/14- already on Mg 800 mg bid. Will recheck on 12/31/14

## 2014-12-29 ENCOUNTER — Other Ambulatory Visit: Payer: Self-pay | Admitting: Oncology

## 2014-12-31 ENCOUNTER — Other Ambulatory Visit (HOSPITAL_BASED_OUTPATIENT_CLINIC_OR_DEPARTMENT_OTHER): Payer: PPO

## 2014-12-31 ENCOUNTER — Ambulatory Visit (HOSPITAL_BASED_OUTPATIENT_CLINIC_OR_DEPARTMENT_OTHER): Payer: PPO

## 2014-12-31 ENCOUNTER — Ambulatory Visit (HOSPITAL_BASED_OUTPATIENT_CLINIC_OR_DEPARTMENT_OTHER): Payer: PPO | Admitting: Oncology

## 2014-12-31 VITALS — BP 121/47 | HR 83 | Temp 98.5°F | Resp 18 | Ht 61.0 in | Wt 180.5 lb

## 2014-12-31 DIAGNOSIS — C188 Malignant neoplasm of overlapping sites of colon: Secondary | ICD-10-CM

## 2014-12-31 DIAGNOSIS — C787 Secondary malignant neoplasm of liver and intrahepatic bile duct: Secondary | ICD-10-CM | POA: Diagnosis not present

## 2014-12-31 DIAGNOSIS — C189 Malignant neoplasm of colon, unspecified: Secondary | ICD-10-CM

## 2014-12-31 DIAGNOSIS — Z5111 Encounter for antineoplastic chemotherapy: Secondary | ICD-10-CM | POA: Diagnosis not present

## 2014-12-31 DIAGNOSIS — Z5112 Encounter for antineoplastic immunotherapy: Secondary | ICD-10-CM

## 2014-12-31 DIAGNOSIS — E119 Type 2 diabetes mellitus without complications: Secondary | ICD-10-CM

## 2014-12-31 DIAGNOSIS — D6481 Anemia due to antineoplastic chemotherapy: Secondary | ICD-10-CM

## 2014-12-31 LAB — COMPREHENSIVE METABOLIC PANEL (CC13)
ALT: 15 U/L (ref 0–55)
AST: 25 U/L (ref 5–34)
Albumin: 2.6 g/dL — ABNORMAL LOW (ref 3.5–5.0)
Alkaline Phosphatase: 225 U/L — ABNORMAL HIGH (ref 40–150)
Anion Gap: 15 mEq/L — ABNORMAL HIGH (ref 3–11)
BUN: 18.2 mg/dL (ref 7.0–26.0)
CO2: 21 mEq/L — ABNORMAL LOW (ref 22–29)
CREATININE: 1.4 mg/dL — AB (ref 0.6–1.1)
Calcium: 8.1 mg/dL — ABNORMAL LOW (ref 8.4–10.4)
Chloride: 107 mEq/L (ref 98–109)
EGFR: 35 mL/min/{1.73_m2} — ABNORMAL LOW (ref >90)
Glucose: 171 mg/dl — ABNORMAL HIGH (ref 70–140)
Potassium: 4.4 mEq/L (ref 3.5–5.1)
Sodium: 143 mEq/L (ref 136–145)
Total Bilirubin: 1.4 mg/dL — ABNORMAL HIGH (ref 0.20–1.20)
Total Protein: 6 g/dL — ABNORMAL LOW (ref 6.4–8.3)

## 2014-12-31 LAB — CBC WITH DIFFERENTIAL/PLATELET
BASO%: 1.2 % (ref 0.0–2.0)
BASOS ABS: 0.1 10*3/uL (ref 0.0–0.1)
EOS%: 6.4 % (ref 0.0–7.0)
Eosinophils Absolute: 0.5 10*3/uL (ref 0.0–0.5)
HCT: 25.2 % — ABNORMAL LOW (ref 34.8–46.6)
HEMOGLOBIN: 8.1 g/dL — AB (ref 11.6–15.9)
LYMPH%: 24.1 % (ref 14.0–49.7)
MCH: 33.2 pg (ref 25.1–34.0)
MCHC: 32.1 g/dL (ref 31.5–36.0)
MCV: 103.7 fL — ABNORMAL HIGH (ref 79.5–101.0)
MONO#: 0.5 10*3/uL (ref 0.1–0.9)
MONO%: 6.3 % (ref 0.0–14.0)
NEUT#: 5 10*3/uL (ref 1.5–6.5)
NEUT%: 62 % (ref 38.4–76.8)
Platelets: 196 10*3/uL (ref 145–400)
RBC: 2.43 10*6/uL — ABNORMAL LOW (ref 3.70–5.45)
RDW: 19.6 % — ABNORMAL HIGH (ref 11.2–14.5)
WBC: 8.1 10*3/uL (ref 3.9–10.3)
lymph#: 1.9 10*3/uL (ref 0.9–3.3)

## 2014-12-31 LAB — CEA: CEA: 8 ng/mL — ABNORMAL HIGH (ref 0.0–5.0)

## 2014-12-31 LAB — MAGNESIUM (CC13): Magnesium: 1.2 mg/dl — CL (ref 1.5–2.5)

## 2014-12-31 MED ORDER — SODIUM CHLORIDE 0.9 % IV SOLN
Freq: Once | INTRAVENOUS | Status: AC
  Administered 2014-12-31: 11:00:00 via INTRAVENOUS

## 2014-12-31 MED ORDER — SODIUM CHLORIDE 0.9 % IV SOLN
6.0000 mg/kg | Freq: Once | INTRAVENOUS | Status: AC
  Administered 2014-12-31: 500 mg via INTRAVENOUS
  Filled 2014-12-31: qty 25

## 2014-12-31 MED ORDER — LEUCOVORIN CALCIUM INJECTION 350 MG
250.0000 mg/m2 | Freq: Once | INTRAVENOUS | Status: AC
  Administered 2014-12-31: 470 mg via INTRAVENOUS
  Filled 2014-12-31: qty 23.5

## 2014-12-31 MED ORDER — SODIUM CHLORIDE 0.9 % IV SOLN
1500.0000 mg/m2 | INTRAVENOUS | Status: DC
  Administered 2014-12-31: 2800 mg via INTRAVENOUS
  Filled 2014-12-31: qty 56

## 2014-12-31 MED ORDER — PROCHLORPERAZINE MALEATE 10 MG PO TABS
ORAL_TABLET | ORAL | Status: AC
  Filled 2014-12-31: qty 1

## 2014-12-31 MED ORDER — FLUOROURACIL CHEMO INJECTION 500 MG/10ML
250.0000 mg/m2 | Freq: Once | INTRAVENOUS | Status: AC
  Administered 2014-12-31: 450 mg via INTRAVENOUS
  Filled 2014-12-31: qty 9

## 2014-12-31 MED ORDER — PROCHLORPERAZINE MALEATE 10 MG PO TABS
10.0000 mg | ORAL_TABLET | Freq: Once | ORAL | Status: AC
  Administered 2014-12-31: 10 mg via ORAL

## 2014-12-31 NOTE — Patient Instructions (Signed)
[facility] Discharge Instructions for Patients Receiving Chemotherapy  Today you received the following chemotherapy agents: Vectibix, 5FU, and leucovorin  To help prevent nausea and vomiting after your treatment, we encourage you to take your nausea medication as prescribed.    If you develop nausea and vomiting that is not controlled by your nausea medication, call the clinic.   BELOW ARE SYMPTOMS THAT SHOULD BE REPORTED IMMEDIATELY:  *FEVER GREATER THAN 100.5 F  *CHILLS WITH OR WITHOUT FEVER  NAUSEA AND VOMITING THAT IS NOT CONTROLLED WITH YOUR NAUSEA MEDICATION  *UNUSUAL SHORTNESS OF BREATH  *UNUSUAL BRUISING OR BLEEDING  TENDERNESS IN MOUTH AND THROAT WITH OR WITHOUT PRESENCE OF ULCERS  *URINARY PROBLEMS  *BOWEL PROBLEMS  UNUSUAL RASH Items with * indicate a potential emergency and should be followed up as soon as possible.  Feel free to call the clinic you have any questions or concerns. The clinic phone number is [phone].

## 2014-12-31 NOTE — Progress Notes (Signed)
[facility] OFFICE PROGRESS NOTE   Diagnosis: Colon cancer  INTERVAL HISTORY:   She returns as scheduled. She was last treated with panitumumab and 5-fluorouracil 12/17/2014. She would like to switch to an every 3 week schedule. No mouth sores or diarrhea. Good appetite. She complains of brittleness of the fingernails. She has developed "bumps "over the chest and extremities that she treats with Neosporin.  Objective:  Vital signs in last 24 hours:  Blood pressure 121/47, pulse 83, temperature 98.5 F (36.9 C), temperature source Oral, resp. rate 18, height 5' 1"  (1.549 m), weight 180 lb 8 oz (81.874 kg).    HEENT: No thrush or ulcers Resp: Lungs clear bilaterally Cardio: Regular rhythm with premature beats, 2/6 systolic murmur GI: No hepatomegaly, nontender, no mass Vascular: Trace edema at the right greater than left lower leg  Skin: Dry hyperpigmentation over the trunk with a few acne-type lesions at the upper chest, a few small linear ulcers surrounding the nail beds   Portacath/PICC-without erythema  Lab Results:  Lab Results  Component Value Date   WBC 8.1 12/31/2014   HGB 8.1* 12/31/2014   HCT 25.2* 12/31/2014   MCV 103.7* 12/31/2014   PLT 196 12/31/2014   NEUTROABS 5.0 12/31/2014     Lab Results  Component Value Date   CEA 9.6* 12/17/2014   Medications: I have reviewed the patient's current medications.  Assessment/Plan: 1. Metastatic colon cancer presenting with synchronous colon primaries in September, 2008, status post a hemicolectomy and liver biopsy 08/03/2007. A CT on 04/20/2010 revealed no evidence metastatic disease. She was maintained on 5-FU/leucovorin per the FOLFOX regimen beginning in April, 2009. Avasta was discontinued from the chemotherapy regimen beginning in November, 2009 due to nephrotic range proteinuria. A CT of the abdomen and pelvis 02/05/2011 revealed evidence of three small liver lesions. An MRI of the abdomen on  03/02/2011 confirmed 3 liver lesions and no additional evidence of metastatic disease. K-RAS testing on the transverse colon and left colon tumors revealed a wild type genotype. Foundation 1 testing revealed no K-ras, NRAS, or BRAF mutation. 1A. Staging PET scan 03/21/2011 confirmed 3 hypermetabolic liver lesions and no additional evidence of metastatic disease.  1B. Surgical consultation by Dr. Barry Lawson was obtained and Emily Lawson decided against an attempt at surgical resection/radiofrequency ablation.  1C. Salvage therapy with FOLFOX was initiated 04/13/2011.  1D. A restaging CT 06/28/2011 revealed a significant improvement in the hepatic metastasis.  1E. She completed eight cycles of FOLFOX chemotherapy. Treatment was switched to 5FU-leucovorin as per the FOLFOX regimen beginning with cycle #9 due to an allergic reaction (skin rash) following cycle #8.  73F. Restaging CT 10/25/2011 revealed 2 more conspicuous liver metastases .  1G. Restaging PET scan 12/24/2011 revealed an increase in the size and metabolic activity of 3 liver metastases  1H. She began FOLFIRI chemotherapy on 01/19/2012.  1 I. Restaging CT of the abdomen 05/08/2012 revealed a mixed response with enlargement of 2 liver lesions and a decrease in the size of a third lesion, no new lesions.  1J. FOLFIRI chemotherapy continued with the addition of Avastin beginning 05/09/2012.  1K. restaging CT 07/28/2012 with no new liver lesions and a decreased size of the previously noted 3 liver lesions.  1L. She was last treated with FOLFIRI/Avastin on 09/12/2012.  35M. She completed stereotactic radiotherapy to 3 liver metastases 10/10/2012 through 10/20/2012.  1N. Follow-up MRI of the abdomen 11/24/2012 showed improvement in the 3 hepatic metastases and no progressive disease.  1O. Restaging  MRI 04/09/2013 showed improvement in the 3 treated hepatic lesions and 2 new lesions. No evidence of metastatic disease outside of liver.  1P.  Initiation of Xeloda/Avastin 05/30/2013. Cycle 2 initiated on 06/20/2013 (Avastin placed on hold)-Xeloda discontinued after 10 days secondary to hand/foot syndrome. Cycle 3 Xeloda question initiated on 07/28/2013 (she is unable to recall when she started or stopped cycle 3). Cycle 4 08/20/2013. Cycle 5 09/14/2013 (no Xeloda). Cycle 6 Xeloda/Avastin 10/01/2013. Cycle 7 Xeloda/Avastin 11/02/2013  1Q. restaging MRI of the liver 11/16/2013-3 enhancing right liver lesion, slightly larger. No evidence of metastasis in the left hepatic lobe.  1R. Xeloda/Avastin continued.  1S. restaging PET scan 01/28/2014 showed 2 hypermetabolic lesions in the liver corresponding to the abnormality seen on the recent MRI. The third lesion seen on the recent MRI was not discernible on the PET. No other evidence for hypermetabolic metastases.  Emily Lawson continued.  1U. CT abdomen/pelvis 06/14/2014 with new moderate ascites; large infiltrating lesions again noted within the right hepatic lobe difficult to fully assess on CT. Mildly increased hepatic atrophy with regard to the 2 larger lesions. A smaller lobulated lesion had increased mildly in size. 1V. CT chest/abdomen 09/09/2014 with progression of multifocal hepatic metastases. No evidence of metastatic disease in the chest. 1W. Initiation of 5-fluorouracil/Panitumumab on a 2 week schedule 09/10/2014.  1. Diabetes. 2. History of proteinuria secondary to Avastin versus diabetes. 3. Mucositis and hand foot syndrome, secondary to 5FU improved with a dose reduction of the 5-FU. 4. Anemia secondary to chemotherapy.  5. Left back and left leg pain with an MRI of the lumbar spine confirming lumbar disk disease. She is status post surgery by Dr. Christella Noa 02/14/2008 with resolution of the back and left leg pain. 6. Rosacea. She is followed by dermatology.  7. Depression, maintained on Cymbalta 8. History of bilateral knee pain secondary to arthritis -- she takes  Vicodin as needed. 9. History of anorexia -- weight loss -- ?Related to depression/anxiety or metastatic colon cancer. 10. Allergic reaction to oxaliplatin with FOLFOX on 04/13/2011. She was able to tolerate FOLFOX on 04/20/2011 with steroid premedication and a prolonged oxaliplatin infusion. After cycle #8 she developed a rash upon returning home with pruritus. She took Benadryl with resolution of symptoms. This reaction occurred despite steroid premedication. 11. Poor dentition -- she was evaluated by Dr. Enrique Sack. She underwent tooth extractions 07/04/2013. She now has dentures.  12. Neutropenia following cycle 1 of FOLFIRI. The FOLFIRI was dose reduced beginning with cycle 2. She was neutropenic 03/14/2012 and chemotherapy was held for one week.  13. Nausea and diarrhea during the irinotecan infusion on 03/21/2012. Symptoms resolved with atropine. She receives atropine as a premedication with each cycle. 14. Diarrhea following the 05/09/2012 chemotherapy cycle. Likely related to irinotecan. She takes Imodium as needed. 15. History of Diffuse pain 4-5 days after chemotherapy 07/17/2012-likely toxicity from Neulasta. 16. History of a fall with fracture of the right fifth metacarpal. 17. Hand/foot syndrome secondary to Xeloda-the Xeloda was dose reduced beginning with cycle 3 on 07/23/2013 (?).    Disposition:  Ms. Penelope Coop appears stable. The plan is to continue 5-FU/panitumumab. She will complete another cycle today and then changed to a 3 week schedule. She will be scheduled for a restaging CT prior to an office visit and treatment on 01/22/2015.     Betsy Coder, MD  12/31/2014  10:04 AM

## 2014-12-31 NOTE — Progress Notes (Signed)
Dr. Benay Spice made aware of Magnesium level 1.2.

## 2015-01-01 ENCOUNTER — Telehealth: Payer: Self-pay | Admitting: Oncology

## 2015-01-01 ENCOUNTER — Telehealth: Payer: Self-pay | Admitting: *Deleted

## 2015-01-01 NOTE — Telephone Encounter (Signed)
Confirm appointments for February. °

## 2015-01-01 NOTE — Telephone Encounter (Signed)
Per staff message and POF I have scheduled appts. Advised scheduler of appts. JMW  

## 2015-01-02 ENCOUNTER — Ambulatory Visit (HOSPITAL_BASED_OUTPATIENT_CLINIC_OR_DEPARTMENT_OTHER): Payer: PPO

## 2015-01-02 DIAGNOSIS — C787 Secondary malignant neoplasm of liver and intrahepatic bile duct: Secondary | ICD-10-CM

## 2015-01-02 DIAGNOSIS — C189 Malignant neoplasm of colon, unspecified: Secondary | ICD-10-CM

## 2015-01-02 DIAGNOSIS — C188 Malignant neoplasm of overlapping sites of colon: Secondary | ICD-10-CM

## 2015-01-02 MED ORDER — HEPARIN SOD (PORK) LOCK FLUSH 100 UNIT/ML IV SOLN
500.0000 [IU] | Freq: Once | INTRAVENOUS | Status: AC | PRN
  Administered 2015-01-02: 500 [IU]
  Filled 2015-01-02: qty 5

## 2015-01-02 MED ORDER — SODIUM CHLORIDE 0.9 % IJ SOLN
10.0000 mL | INTRAMUSCULAR | Status: DC | PRN
  Administered 2015-01-02: 10 mL
  Filled 2015-01-02: qty 10

## 2015-01-02 NOTE — Patient Instructions (Signed)
Fluorouracil, 5FU; Diclofenac topical cream What is this medicine? FLUOROURACIL; DICLOFENAC (flure oh YOOR a sil; dye KLOE fen ak) is a combination of a topical chemotherapy agent and non-steroidal anti-inflammatory drug (NSAID). It is used on the skin to treat skin cancer and skin conditions that could become cancer. This medicine may be used for other purposes; ask your health care provider or pharmacist if you have questions. COMMON BRAND NAME(S): FLUORAC What should I tell my health care provider before I take this medicine? They need to know if you have any of these conditions: -bleeding problems -cigarette smoker -DPD enzyme deficiency -heart disease -high blood pressure -if you frequently drink alcohol containing drinks -kidney disease -liver disease -open or infected skin -stomach problems -swelling or open sores at the treatment site -recent or planned coronary artery bypass graft (CABG) surgery -an unusual or allergic reaction to fluorouracil, diclofenac, aspirin, other NSAIDs, other medicines, foods, dyes, or preservatives -pregnant or trying to get pregnant -breast-feeding How should I use this medicine? This medicine is only for use on the skin. Follow the directions on the prescription label. Wash hands before and after use. Wash affected area and gently pat dry. To apply this medicine use a cotton-tipped applicator, or use gloves if applying with fingertips. If applied with unprotected fingertips, it is very important to wash your hands well after you apply this medicine. Avoid applying to the eyes, nose, or mouth. Apply enough medicine to cover the affected area. You can cover the area with a light gauze dressing, but do not use tight or air-tight dressings. Finish the full course prescribed by your doctor or health care professional, even if you think your condition is better. Do not stop taking except on the advice of your doctor or health care professional. Talk to your  pediatrician regarding the use of this medicine in children. Special care may be needed. Overdosage: If you think you've taken too much of this medicine contact a poison control center or emergency room at once. Overdosage: If you think you have taken too much of this medicine contact a poison control center or emergency room at once. NOTE: This medicine is only for you. Do not share this medicine with others. What if I miss a dose? If you miss a dose, apply it as soon as you can. If it is almost time for your next dose, only use that dose. Do not apply extra doses. Contact your doctor or health care professional if you miss more than one dose. What may interact with this medicine? Interactions are not expected. Do not use any other skin products without telling your doctor or health care professional. This list may not describe all possible interactions. Give your health care provider a list of all the medicines, herbs, non-prescription drugs, or dietary supplements you use. Also tell them if you smoke, drink alcohol, or use illegal drugs. Some items may interact with your medicine. What should I watch for while using this medicine? Visit your doctor or health care professional for checks on your progress. You will need to use this medicine for 2 to 6 weeks. This may be longer depending on the condition being treated. You may not see full healing for another 1 to 2 months after you stop using the medicine. Treated areas of skin can look unsightly during and for several weeks after treatment with this medicine. This medicine can make you more sensitive to the sun. Keep out of the sun. If you cannot avoid being in   the sun, wear protective clothing and use sunscreen. Do not use sun lamps or tanning beds/booths. Where should I keep my What side effects may I notice from receiving this medicine? Side effects that you should report to your doctor or health care professional as soon as possible: -allergic  reactions like skin rash, itching or hives, swelling of the face, lips, or tongue -black or bloody stools, blood in the urine or vomit -blurred vision -chest pain -difficulty breathing or wheezing -redness, blistering, peeling or loosening of the skin, including inside the mouth -severe redness and swelling of normal skin -slurred speech or weakness on one side of the body -trouble passing urine or change in the amount of urine -unexplained weight gain or swelling -unusually weak or tired -yellowing of eyes or skin Side effects that usually do not require medical attention (Report these to your doctor or health care professional if they continue or are bothersome.): -increased sensitivity of the skin to sun and ultraviolet light -pain and burning of the affected area -scaling or swelling of the affected area -skin rash, itching of the affected area -tenderness This list may not describe all possible side effects. Call your doctor for medical advice about side effects. You may report side effects to FDA at 1-800-FDA-1088. Where should I keep my medicine? Keep out of the reach of children. Store at room temperature between 20 and 25 degrees C (68 and 77 degrees F). Throw away any unused medicine after the expiration date. NOTE: This sheet is a summary. It may not cover all possible information. If you have questions about this medicine, talk to your doctor, pharmacist, or health care provider.  2015, [employer]. (2014-03-18 11:09:58)  

## 2015-01-09 ENCOUNTER — Other Ambulatory Visit: Payer: Self-pay | Admitting: Oncology

## 2015-01-09 ENCOUNTER — Telehealth: Payer: Self-pay

## 2015-01-09 NOTE — Telephone Encounter (Signed)
Pt called stating a friend suggested she use aloe vera juice orally. Checked with pharmacy that this would not interact with what she is taking. No drug interactions were noted. Called pt back with this information.

## 2015-01-19 ENCOUNTER — Other Ambulatory Visit: Payer: Self-pay | Admitting: Oncology

## 2015-01-20 ENCOUNTER — Other Ambulatory Visit (HOSPITAL_BASED_OUTPATIENT_CLINIC_OR_DEPARTMENT_OTHER): Payer: PPO

## 2015-01-20 ENCOUNTER — Encounter (HOSPITAL_COMMUNITY): Payer: Self-pay

## 2015-01-20 ENCOUNTER — Telehealth: Payer: Self-pay | Admitting: *Deleted

## 2015-01-20 ENCOUNTER — Ambulatory Visit (HOSPITAL_COMMUNITY)
Admission: RE | Admit: 2015-01-20 | Discharge: 2015-01-20 | Disposition: A | Discharge status: Home / Self Care | Payer: PPO | Source: Ambulatory Visit | Attending: Oncology | Admitting: Oncology

## 2015-01-20 DIAGNOSIS — C189 Malignant neoplasm of colon, unspecified: Secondary | ICD-10-CM | POA: Insufficient documentation

## 2015-01-20 DIAGNOSIS — C787 Secondary malignant neoplasm of liver and intrahepatic bile duct: Secondary | ICD-10-CM | POA: Insufficient documentation

## 2015-01-20 DIAGNOSIS — D6481 Anemia due to antineoplastic chemotherapy: Secondary | ICD-10-CM

## 2015-01-20 DIAGNOSIS — Z79899 Other long term (current) drug therapy: Secondary | ICD-10-CM | POA: Insufficient documentation

## 2015-01-20 DIAGNOSIS — Z08 Encounter for follow-up examination after completed treatment for malignant neoplasm: Secondary | ICD-10-CM | POA: Diagnosis not present

## 2015-01-20 DIAGNOSIS — Z923 Personal history of irradiation: Secondary | ICD-10-CM | POA: Insufficient documentation

## 2015-01-20 DIAGNOSIS — C188 Malignant neoplasm of overlapping sites of colon: Secondary | ICD-10-CM

## 2015-01-20 DIAGNOSIS — R188 Other ascites: Secondary | ICD-10-CM | POA: Insufficient documentation

## 2015-01-20 LAB — CBC WITH DIFFERENTIAL/PLATELET
BASO%: 1.2 % (ref 0.0–2.0)
Basophils Absolute: 0.1 10*3/uL (ref 0.0–0.1)
EOS%: 5.9 % (ref 0.0–7.0)
Eosinophils Absolute: 0.4 10*3/uL (ref 0.0–0.5)
HCT: 25.6 % — ABNORMAL LOW (ref 34.8–46.6)
HGB: 8.3 g/dL — ABNORMAL LOW (ref 11.6–15.9)
LYMPH%: 25.8 % (ref 14.0–49.7)
MCH: 33.5 pg (ref 25.1–34.0)
MCHC: 32.5 g/dL (ref 31.5–36.0)
MCV: 103.1 fL — AB (ref 79.5–101.0)
MONO#: 0.5 10*3/uL (ref 0.1–0.9)
MONO%: 7.9 % (ref 0.0–14.0)
NEUT#: 4.1 10*3/uL (ref 1.5–6.5)
NEUT%: 59.2 % (ref 38.4–76.8)
Platelets: 242 10*3/uL (ref 145–400)
RBC: 2.48 10*6/uL — ABNORMAL LOW (ref 3.70–5.45)
RDW: 19.9 % — ABNORMAL HIGH (ref 11.2–14.5)
WBC: 7 10*3/uL (ref 3.9–10.3)
lymph#: 1.8 10*3/uL (ref 0.9–3.3)

## 2015-01-20 LAB — COMPREHENSIVE METABOLIC PANEL (CC13)
ALK PHOS: 266 U/L — AB (ref 40–150)
ALT: 22 U/L (ref 0–55)
ANION GAP: 14 meq/L — AB (ref 3–11)
AST: 47 U/L — ABNORMAL HIGH (ref 5–34)
Albumin: 2.7 g/dL — ABNORMAL LOW (ref 3.5–5.0)
BUN: 21 mg/dL (ref 7.0–26.0)
CO2: 25 mEq/L (ref 22–29)
Calcium: 9 mg/dL (ref 8.4–10.4)
Chloride: 103 mEq/L (ref 98–109)
Creatinine: 1.4 mg/dL — ABNORMAL HIGH (ref 0.6–1.1)
EGFR: 36 mL/min/{1.73_m2} — ABNORMAL LOW (ref >90)
Glucose: 150 mg/dl — ABNORMAL HIGH (ref 70–140)
Potassium: 3.8 mEq/L (ref 3.5–5.1)
Sodium: 143 mEq/L (ref 136–145)
Total Bilirubin: 1.2 mg/dL (ref 0.20–1.20)
Total Protein: 6 g/dL — ABNORMAL LOW (ref 6.4–8.3)

## 2015-01-20 LAB — MAGNESIUM (CC13): MAGNESIUM: 1 mg/dL — AB (ref 1.5–2.5)

## 2015-01-20 MED ORDER — IOHEXOL 300 MG/ML  SOLN
80.0000 mL | Freq: Once | INTRAMUSCULAR | Status: AC | PRN
  Administered 2015-01-20: 80 mL via INTRAVENOUS

## 2015-01-20 NOTE — Telephone Encounter (Signed)
Received critical Magnesium of 1.0. Dr. Benay Spice made aware. Need to be sure pt is taking Magnesium 800 mg BID. Pt is scheduled for CT today. Notified Kristie in radiology of pt's elevated creatinine. She reports they will assess labs per radiology protocol to see if pt can receive contrast for scan.

## 2015-01-22 ENCOUNTER — Ambulatory Visit (HOSPITAL_BASED_OUTPATIENT_CLINIC_OR_DEPARTMENT_OTHER): Payer: PPO

## 2015-01-22 ENCOUNTER — Telehealth: Payer: Self-pay | Admitting: Nurse Practitioner

## 2015-01-22 ENCOUNTER — Ambulatory Visit (HOSPITAL_BASED_OUTPATIENT_CLINIC_OR_DEPARTMENT_OTHER): Payer: PPO | Admitting: Nurse Practitioner

## 2015-01-22 ENCOUNTER — Telehealth: Payer: Self-pay | Admitting: *Deleted

## 2015-01-22 VITALS — BP 147/57 | HR 79 | Temp 98.2°F | Resp 16 | Wt 173.3 lb

## 2015-01-22 DIAGNOSIS — C189 Malignant neoplasm of colon, unspecified: Secondary | ICD-10-CM

## 2015-01-22 DIAGNOSIS — Z5111 Encounter for antineoplastic chemotherapy: Secondary | ICD-10-CM | POA: Diagnosis not present

## 2015-01-22 DIAGNOSIS — C188 Malignant neoplasm of overlapping sites of colon: Secondary | ICD-10-CM

## 2015-01-22 DIAGNOSIS — Z5112 Encounter for antineoplastic immunotherapy: Secondary | ICD-10-CM | POA: Diagnosis not present

## 2015-01-22 DIAGNOSIS — C787 Secondary malignant neoplasm of liver and intrahepatic bile duct: Secondary | ICD-10-CM | POA: Diagnosis not present

## 2015-01-22 DIAGNOSIS — D6481 Anemia due to antineoplastic chemotherapy: Secondary | ICD-10-CM

## 2015-01-22 MED ORDER — FLUOROURACIL CHEMO INJECTION 500 MG/10ML
250.0000 mg/m2 | Freq: Once | INTRAVENOUS | Status: AC
  Administered 2015-01-22: 450 mg via INTRAVENOUS
  Filled 2015-01-22: qty 9

## 2015-01-22 MED ORDER — FLUOROURACIL CHEMO INJECTION 5 GM/100ML
1500.0000 mg/m2 | INTRAVENOUS | Status: AC
  Administered 2015-01-22: 2800 mg via INTRAVENOUS
  Filled 2015-01-22: qty 56

## 2015-01-22 MED ORDER — SODIUM CHLORIDE 0.9 % IJ SOLN
10.0000 mL | INTRAMUSCULAR | Status: DC | PRN
  Filled 2015-01-22: qty 10

## 2015-01-22 MED ORDER — PROCHLORPERAZINE MALEATE 10 MG PO TABS
ORAL_TABLET | ORAL | Status: AC
  Filled 2015-01-22: qty 1

## 2015-01-22 MED ORDER — LEUCOVORIN CALCIUM INJECTION 350 MG
250.0000 mg/m2 | Freq: Once | INTRAVENOUS | Status: AC
  Administered 2015-01-22: 470 mg via INTRAVENOUS
  Filled 2015-01-22: qty 23.5

## 2015-01-22 MED ORDER — SODIUM CHLORIDE 0.9 % IV SOLN
Freq: Once | INTRAVENOUS | Status: AC
  Administered 2015-01-22: 11:00:00 via INTRAVENOUS

## 2015-01-22 MED ORDER — SODIUM CHLORIDE 0.9 % IV SOLN: Freq: Once | INTRAVENOUS | Status: DC

## 2015-01-22 MED ORDER — SODIUM CHLORIDE 0.9 % IV SOLN
6.0000 mg/kg | Freq: Once | INTRAVENOUS | Status: AC
  Administered 2015-01-22: 500 mg via INTRAVENOUS
  Filled 2015-01-22: qty 25

## 2015-01-22 MED ORDER — PROCHLORPERAZINE MALEATE 10 MG PO TABS
10.0000 mg | ORAL_TABLET | Freq: Once | ORAL | Status: AC
  Administered 2015-01-22: 10 mg via ORAL

## 2015-01-22 NOTE — Telephone Encounter (Signed)
Gave avs & calendar for February/March/April. Sent message to schedule treatment.

## 2015-01-22 NOTE — Progress Notes (Addendum)
[place] OFFICE PROGRESS NOTE   Diagnosis:  Colon cancer  INTERVAL HISTORY:   Emily Lawson returns as scheduled. She continues 5-FU/Panitumumab. No significant nausea/vomiting. No mouth sores. She has occasional loose stools. The majority of the time stools are formed. She confirms taking magnesium as prescribed. She continues to have a good appetite. She reports a mild rash over the upper back.  Objective:  Vital signs in last 24 hours:  Blood pressure 147/57, pulse 79, temperature 98.2 F (36.8 C), temperature source Oral, resp. rate 16, weight 173 lb 4.8 oz (78.608 kg), SpO2 100 %.    HEENT: No thrush or ulcers. Resp: Lungs clear bilaterally. Cardio: Regular rate and rhythm. GI: Abdomen soft and nontender. No hepatomegaly. Vascular: Trace edema at the lower legs bilaterally right slightly greater than left.  Skin: A few acne-like lesions at the upper back. A few small linear ulcers at the fingertips. Port-A-Cath without erythema.    Lab Results:  Lab Results  Component Value Date   WBC 7.0 01/20/2015   HGB 8.3* 01/20/2015   HCT 25.6* 01/20/2015   MCV 103.1* 01/20/2015   PLT 242 01/20/2015   NEUTROABS 4.1 01/20/2015    Imaging:  Ct Abdomen W Contrast  01/20/2015   CLINICAL DATA:  Metastatic colon cancer with liver metastases, chemotherapy ongoing, prior XRT  EXAM: CT ABDOMEN WITH CONTRAST  TECHNIQUE: Multidetector CT imaging of the abdomen was performed using the standard protocol following bolus administration of intravenous contrast.  CONTRAST:  1m OMNIPAQUE IOHEXOL 300 MG/ML  SOLN  COMPARISON:  CT abdomen pelvis dated 09/09/2014  FINDINGS: Lower chest:  Lung bases are clear.  Hepatobiliary: 2.5 x 1.4 cm subcapsular metastasis in the posterior segment right hepatic lobe (series 2/image 20), previously 3.2 x 1.7 cm.  2.7 x 3.8 cm metastasis in the posterior right hepatic dome (series 2/image 16), previously 3.3 x 4.7 cm.  Additional ill-defined lesions  in the medial segment left hepatic lobe (series 2/image 15) and inferiorly in the posterior segment right hepatic lobe series 2/image 24) are similar to the prior study but difficult to discretely measure.  Status post cholecystectomy. No intrahepatic or extrahepatic ductal dilatation.  Pancreas: Within normal limits.  Spleen: Within normal limits.  Adrenals/Urinary Tract: Adrenal glands are unremarkable.  3.2 x 2.9 cm cyst in the lateral interpolar left kidney. Right kidney is within normal limits. No hydronephrosis.  Stomach/Bowel: Stomach is unremarkable.  Small duodenum diverticulum.  Visualized bowel is otherwise grossly unremarkable, noting a colonic suture line in the right mid abdomen.  Vascular/Lymphatic: Atherosclerotic calcifications of the abdominal aorta and branch vessels.  No suspicious abdominopelvic lymphadenopathy.  Other: Moderate perihepatic and perisplenic ascites.  Musculoskeletal: Degenerative changes of the visualized thoracolumbar spine.  IMPRESSION: Multifocal hepatic metastases, likely mildly improved, with index lesions as described above.  Moderate abdominal ascites.  Please note that the pelvis was not imaged.   Electronically Signed   By: SJulian HyM.D.   On: 01/20/2015 13:22    Medications: I have reviewed the patient's current medications.  Assessment/Plan: 1. Metastatic colon cancer presenting with synchronous colon primaries in September, 2008, status post a hemicolectomy and liver biopsy 08/03/2007. A CT on 04/20/2010 revealed no evidence metastatic disease. She was maintained on 5-FU/leucovorin per the FOLFOX regimen beginning in April, 2009. Avasta was discontinued from the chemotherapy regimen beginning in November, 2009 due to nephrotic range proteinuria. A CT of the abdomen and pelvis 02/05/2011 revealed evidence of three small liver lesions. An MRI of  the abdomen on 03/02/2011 confirmed 3 liver lesions and no additional evidence of metastatic disease. K-RAS  testing on the transverse colon and left colon tumors revealed a wild type genotype. Foundation 1 testing revealed no K-ras, NRAS, or BRAF mutation. 1A. Staging PET scan 03/21/2011 confirmed 3 hypermetabolic liver lesions and no additional evidence of metastatic disease.  1B. Surgical consultation by Dr. Barry Dienes was obtained and Emily Lawson decided against an attempt at surgical resection/radiofrequency ablation.  1C. Salvage therapy with FOLFOX was initiated 04/13/2011.  1D. A restaging CT 06/28/2011 revealed a significant improvement in the hepatic metastasis.  1E. She completed eight cycles of FOLFOX chemotherapy. Treatment was switched to 5FU-leucovorin as per the FOLFOX regimen beginning with cycle #9 due to an allergic reaction (skin rash) following cycle #8.  109F. Restaging CT 10/25/2011 revealed 2 more conspicuous liver metastases .  1G. Restaging PET scan 12/24/2011 revealed an increase in the size and metabolic activity of 3 liver metastases  1H. She began FOLFIRI chemotherapy on 01/19/2012.  1 I. Restaging CT of the abdomen 05/08/2012 revealed a mixed response with enlargement of 2 liver lesions and a decrease in the size of a third lesion, no new lesions.  1J. FOLFIRI chemotherapy continued with the addition of Avastin beginning 05/09/2012.  1K. restaging CT 07/28/2012 with no new liver lesions and a decreased size of the previously noted 3 liver lesions.  1L. She was last treated with FOLFIRI/Avastin on 09/12/2012.  49M. She completed stereotactic radiotherapy to 3 liver metastases 10/10/2012 through 10/20/2012.  1N. Follow-up MRI of the abdomen 11/24/2012 showed improvement in the 3 hepatic metastases and no progressive disease.  1O. Restaging MRI 04/09/2013 showed improvement in the 3 treated hepatic lesions and 2 new lesions. No evidence of metastatic disease outside of liver.  1P. Initiation of Xeloda/Avastin 05/30/2013. Cycle 2 initiated on 06/20/2013 (Avastin placed on  hold)-Xeloda discontinued after 10 days secondary to hand/foot syndrome. Cycle 3 Xeloda question initiated on 07/28/2013 (she is unable to recall when she started or stopped cycle 3). Cycle 4 08/20/2013. Cycle 5 09/14/2013 (no Xeloda). Cycle 6 Xeloda/Avastin 10/01/2013. Cycle 7 Xeloda/Avastin 11/02/2013  1Q. restaging MRI of the liver 11/16/2013-3 enhancing right liver lesion, slightly larger. No evidence of metastasis in the left hepatic lobe.  1R. Xeloda/Avastin continued.  1S. restaging PET scan 01/28/2014 showed 2 hypermetabolic lesions in the liver corresponding to the abnormality seen on the recent MRI. The third lesion seen on the recent MRI was not discernible on the PET. No other evidence for hypermetabolic metastases.  Emily Lawson continued.  1U. CT abdomen/pelvis 06/14/2014 with new moderate ascites; large infiltrating lesions again noted within the right hepatic lobe difficult to fully assess on CT. Mildly increased hepatic atrophy with regard to the 2 larger lesions. A smaller lobulated lesion had increased mildly in size. 1V. CT chest/abdomen 09/09/2014 with progression of multifocal hepatic metastases. No evidence of metastatic disease in the chest. 1W. Initiation of 5-fluorouracil/Panitumumab on a 2 week schedule 09/10/2014. 1X.  CT 01/20/2015 with likely mild improvement in multifocal hepatic metastases. 1Y.  Continuation of 5-FU/Panitumumab on a 3 week schedule to 24 2016.  1. Diabetes. 2. History of proteinuria secondary to Avastin versus diabetes. 3. Mucositis and hand foot syndrome, secondary to 5FU improved with a dose reduction of the 5-FU. 4. Anemia secondary to chemotherapy.  5. Left back and left leg pain with an MRI of the lumbar spine confirming lumbar disk disease. She is status post surgery by Dr. Christella Noa 02/14/2008 with resolution of  the back and left leg pain. 6. Rosacea. She is followed by dermatology.  7. Depression, maintained on  Cymbalta 8. History of bilateral knee pain secondary to arthritis -- she takes Vicodin as needed. 9. History of anorexia -- weight loss -- ?Related to depression/anxiety or metastatic colon cancer. 10. Allergic reaction to oxaliplatin with FOLFOX on 04/13/2011. She was able to tolerate FOLFOX on 04/20/2011 with steroid premedication and a prolonged oxaliplatin infusion. After cycle #8 she developed a rash upon returning home with pruritus. She took Benadryl with resolution of symptoms. This reaction occurred despite steroid premedication. 11. Poor dentition -- she was evaluated by Dr. Enrique Sack. She underwent tooth extractions 07/04/2013. She now has dentures.  12. Neutropenia following cycle 1 of FOLFIRI. The FOLFIRI was dose reduced beginning with cycle 2. She was neutropenic 03/14/2012 and chemotherapy was held for one week.  13. Nausea and diarrhea during the irinotecan infusion on 03/21/2012. Symptoms resolved with atropine. She receives atropine as a premedication with each cycle. 14. Diarrhea following the 05/09/2012 chemotherapy cycle. Likely related to irinotecan. She takes Imodium as needed. 15. History of Diffuse pain 4-5 days after chemotherapy 07/17/2012-likely toxicity from Neulasta. 16. History of a fall with fracture of the right fifth metacarpal. 17. Hand/foot syndrome secondary to Xeloda-the Xeloda was dose reduced beginning with cycle 3 on 07/23/2013 (?). 18. Hypomagnesemia secondary to Panitumumab.   Disposition: Ms. Galeas appears stable. The restaging CT evaluation shows improvement in liver metastases. The plan is to continue 5-FU/Panitumumab on a 3 week schedule.  She has hypomagnesemia secondary to Panitumumab. There has been no improvement with oral magnesium. She will receive IV magnesium on 01/24/2015.  She will return for a follow-up visit in 3 weeks. She will contact the office in the interim with any problems.  Patient seen with Dr. Benay Spice.    Ned Card  ANP/GNP-BC   01/22/2015  9:42 AM  This was a shared visit with Ned Card. The restaging CT confirms improvement of the liver metastases. Plan to continue 5-fu/panitumumab. Replete magnesium.  Julieanne Manson, MD

## 2015-01-22 NOTE — Telephone Encounter (Signed)
Per staff message and POF I have scheduled appts. Advised scheduler of appts. JMW  

## 2015-01-22 NOTE — Patient Instructions (Signed)
Return to clinic at     on Friday 01/24/15 for pump disconnect.      [facility] Discharge Instructions for Patients Receiving Chemotherapy  Today you received the following chemotherapy agents: vectibix, leucovorin, 47fu push, 51fu pump.  To help prevent nausea and vomiting after your treatment, we encourage you to take your nausea medication.  Take it as often as prescribed.     If you develop nausea and vomiting that is not controlled by your nausea medication, call the clinic. If it is after clinic hours your family physician or the after hours number for the clinic or go to the Emergency Department.   BELOW ARE SYMPTOMS THAT SHOULD BE REPORTED IMMEDIATELY:  *FEVER GREATER THAN 100.5 F  *CHILLS WITH OR WITHOUT FEVER  NAUSEA AND VOMITING THAT IS NOT CONTROLLED WITH YOUR NAUSEA MEDICATION  *UNUSUAL SHORTNESS OF BREATH  *UNUSUAL BRUISING OR BLEEDING  TENDERNESS IN MOUTH AND THROAT WITH OR WITHOUT PRESENCE OF ULCERS  *URINARY PROBLEMS  *BOWEL PROBLEMS  UNUSUAL RASH Items with * indicate a potential emergency and should be followed up as soon as possible.  Feel free to call the clinic you have any questions or concerns. The clinic phone number is [phone].   I have been informed and understand all the instructions given to me. I know to contact the clinic, my physician, or go to the Emergency Department if any problems should occur. I do not have any questions at this time, but understand that I may call the clinic during office hours   should I have any questions or need assistance in obtaining follow up care.    __________________________________________  _____________  __________ Signature of Patient or Authorized Representative            Date                   Time    __________________________________________ Nurse's Signature

## 2015-01-24 ENCOUNTER — Ambulatory Visit (HOSPITAL_BASED_OUTPATIENT_CLINIC_OR_DEPARTMENT_OTHER): Payer: PPO

## 2015-01-24 DIAGNOSIS — C787 Secondary malignant neoplasm of liver and intrahepatic bile duct: Secondary | ICD-10-CM

## 2015-01-24 DIAGNOSIS — C189 Malignant neoplasm of colon, unspecified: Secondary | ICD-10-CM

## 2015-01-24 MED ORDER — HEPARIN SOD (PORK) LOCK FLUSH 100 UNIT/ML IV SOLN
500.0000 [IU] | Freq: Once | INTRAVENOUS | Status: AC
  Administered 2015-01-24: 500 [IU] via INTRAVENOUS
  Filled 2015-01-24: qty 5

## 2015-01-24 MED ORDER — SODIUM CHLORIDE 0.9 % IJ SOLN
10.0000 mL | INTRAMUSCULAR | Status: DC | PRN
  Administered 2015-01-24: 10 mL via INTRAVENOUS
  Filled 2015-01-24: qty 10

## 2015-01-24 MED ORDER — HEPARIN SOD (PORK) LOCK FLUSH 100 UNIT/ML IV SOLN
500.0000 [IU] | Freq: Once | INTRAVENOUS | Status: DC | PRN
  Filled 2015-01-24: qty 5

## 2015-01-24 MED ORDER — SODIUM CHLORIDE 0.9 % IJ SOLN
10.0000 mL | INTRAMUSCULAR | Status: DC | PRN
  Filled 2015-01-24: qty 10

## 2015-01-24 MED ORDER — SODIUM CHLORIDE 0.9 % IV SOLN
Freq: Once | INTRAVENOUS | Status: AC
  Administered 2015-01-24: 14:00:00 via INTRAVENOUS
  Filled 2015-01-24: qty 500

## 2015-01-24 NOTE — Patient Instructions (Signed)
Hypomagnesemia Magnesium is a common ion (mineral) in the body which is needed for metabolism. It is about how the body handles food and other chemical reactions necessary for life. Only about 2% of the magnesium in our body is found in the blood. When this is low, it is called hypomagnesemia. The blood will measure only a tiny amount of the magnesium in our body. When it is low in our blood, it does not mean that the whole body supply is low. The normal serum concentration ranges from 1.8-2.5 mEq/L. When the level gets to be less than 1.0 mEq/L, a number of problems begin to happen.  CAUSES   Receiving intravenous fluids without magnesium replacement.  Loss of magnesium from the bowel by nasogastric suction.  Loss of magnesium from nausea and vomiting or severe diarrhea. Any of the inflammatory bowel conditions can cause this.  Abuse of alcohol often leads to low serum magnesium.  An inherited form of magnesium loss happens when the kidneys lose magnesium. This is called familial or primary hypomagnesemia.  Some medications such as diuretics also cause the loss of magnesium. SYMPTOMS  These following problems are worse if the changes in magnesium levels come on suddenly.  Tremor.  Confusion.  Muscle weakness.  Oversensitive to sights and sounds.  Sensitive reflexes.  Depression.  Muscular fibrillations.  Overreactivity of the nerves.  Irritability.  Psychosis.  Spasms of the hand muscles.  Tetany (where the muscles go into uncontrollable spasms). DIAGNOSIS  This condition can be diagnosed by blood tests. TREATMENT   In an emergency, magnesium can be given intravenously (by vein).  If the condition is less worrisome, it can be corrected by diet. High levels of magnesium are found in green leafy vegetables, peas, beans, and nuts among other things. It can also be given through medications by mouth.  If it is being caused by medications, changes can be made.  If  alcohol is a problem, help is available if there are difficulties giving it up. Document Released: 08/11/2005 Document Revised: 04/01/2014 Document Reviewed: 07/05/2008 Exit[employer] Patient Information 2015 [employer]. This information is not intended to replace advice given to you by your health care provider. Make sure you discuss any questions you have with your health care provider.  

## 2015-02-12 ENCOUNTER — Ambulatory Visit (HOSPITAL_BASED_OUTPATIENT_CLINIC_OR_DEPARTMENT_OTHER): Payer: PPO | Admitting: Oncology

## 2015-02-12 ENCOUNTER — Ambulatory Visit (HOSPITAL_BASED_OUTPATIENT_CLINIC_OR_DEPARTMENT_OTHER): Payer: PPO

## 2015-02-12 ENCOUNTER — Other Ambulatory Visit (HOSPITAL_BASED_OUTPATIENT_CLINIC_OR_DEPARTMENT_OTHER): Payer: PPO

## 2015-02-12 VITALS — BP 146/58 | HR 67 | Temp 98.2°F | Resp 18 | Ht 61.0 in | Wt 175.5 lb

## 2015-02-12 DIAGNOSIS — C189 Malignant neoplasm of colon, unspecified: Secondary | ICD-10-CM

## 2015-02-12 DIAGNOSIS — N19 Unspecified kidney failure: Secondary | ICD-10-CM

## 2015-02-12 DIAGNOSIS — D638 Anemia in other chronic diseases classified elsewhere: Secondary | ICD-10-CM

## 2015-02-12 DIAGNOSIS — N289 Disorder of kidney and ureter, unspecified: Secondary | ICD-10-CM

## 2015-02-12 DIAGNOSIS — D6481 Anemia due to antineoplastic chemotherapy: Secondary | ICD-10-CM

## 2015-02-12 DIAGNOSIS — C188 Malignant neoplasm of overlapping sites of colon: Secondary | ICD-10-CM

## 2015-02-12 DIAGNOSIS — C787 Secondary malignant neoplasm of liver and intrahepatic bile duct: Secondary | ICD-10-CM

## 2015-02-12 DIAGNOSIS — Z5111 Encounter for antineoplastic chemotherapy: Secondary | ICD-10-CM

## 2015-02-12 DIAGNOSIS — D649 Anemia, unspecified: Secondary | ICD-10-CM

## 2015-02-12 DIAGNOSIS — Z5112 Encounter for antineoplastic immunotherapy: Secondary | ICD-10-CM

## 2015-02-12 LAB — IRON AND TIBC CHCC
%SAT: 10 % — ABNORMAL LOW (ref 21–57)
IRON: 31 ug/dL — AB (ref 41–142)
TIBC: 293 ug/dL (ref 236–444)
UIBC: 262 ug/dL (ref 120–384)

## 2015-02-12 LAB — FERRITIN CHCC: FERRITIN: 36 ng/mL (ref 9–269)

## 2015-02-12 LAB — COMPREHENSIVE METABOLIC PANEL (CC13)
ALT: 22 U/L (ref 0–55)
AST: 35 U/L — AB (ref 5–34)
Albumin: 2.5 g/dL — ABNORMAL LOW (ref 3.5–5.0)
Alkaline Phosphatase: 334 U/L — ABNORMAL HIGH (ref 40–150)
Anion Gap: 12 mEq/L — ABNORMAL HIGH (ref 3–11)
BUN: 26.9 mg/dL — ABNORMAL HIGH (ref 7.0–26.0)
CHLORIDE: 106 meq/L (ref 98–109)
CO2: 25 mEq/L (ref 22–29)
CREATININE: 1.5 mg/dL — AB (ref 0.6–1.1)
Calcium: 9.3 mg/dL (ref 8.4–10.4)
EGFR: 33 mL/min/{1.73_m2} — ABNORMAL LOW (ref >90)
Glucose: 192 mg/dl — ABNORMAL HIGH (ref 70–140)
Potassium: 4.2 mEq/L (ref 3.5–5.1)
Sodium: 142 mEq/L (ref 136–145)
Total Bilirubin: 1.06 mg/dL (ref 0.20–1.20)
Total Protein: 6.1 g/dL — ABNORMAL LOW (ref 6.4–8.3)

## 2015-02-12 LAB — CBC WITH DIFFERENTIAL/PLATELET
BASO%: 1 % (ref 0.0–2.0)
BASOS ABS: 0.1 10*3/uL (ref 0.0–0.1)
EOS ABS: 0.3 10*3/uL (ref 0.0–0.5)
EOS%: 5 % (ref 0.0–7.0)
HEMATOCRIT: 23.5 % — AB (ref 34.8–46.6)
HGB: 7.4 g/dL — ABNORMAL LOW (ref 11.6–15.9)
LYMPH%: 20.6 % (ref 14.0–49.7)
MCH: 32.7 pg (ref 25.1–34.0)
MCHC: 31.6 g/dL (ref 31.5–36.0)
MCV: 103.6 fL — ABNORMAL HIGH (ref 79.5–101.0)
MONO#: 0.5 10*3/uL (ref 0.1–0.9)
MONO%: 7.2 % (ref 0.0–14.0)
NEUT%: 66.2 % (ref 38.4–76.8)
NEUTROS ABS: 4.4 10*3/uL (ref 1.5–6.5)
PLATELETS: 220 10*3/uL (ref 145–400)
RBC: 2.27 10*6/uL — ABNORMAL LOW (ref 3.70–5.45)
RDW: 19.1 % — ABNORMAL HIGH (ref 11.2–14.5)
WBC: 6.7 10*3/uL (ref 3.9–10.3)
lymph#: 1.4 10*3/uL (ref 0.9–3.3)

## 2015-02-12 LAB — MAGNESIUM (CC13): Magnesium: 1.4 mg/dl — CL (ref 1.5–2.5)

## 2015-02-12 MED ORDER — PROCHLORPERAZINE MALEATE 10 MG PO TABS
ORAL_TABLET | ORAL | Status: AC
  Filled 2015-02-12: qty 1

## 2015-02-12 MED ORDER — EPOETIN ALFA 40000 UNIT/ML IJ SOLN
40000.0000 [IU] | Freq: Once | INTRAMUSCULAR | Status: AC
  Administered 2015-02-12: 40000 [IU] via SUBCUTANEOUS
  Filled 2015-02-12: qty 1

## 2015-02-12 MED ORDER — FLUOROURACIL CHEMO INJECTION 500 MG/10ML
250.0000 mg/m2 | Freq: Once | INTRAVENOUS | Status: AC
  Administered 2015-02-12: 450 mg via INTRAVENOUS
  Filled 2015-02-12: qty 9

## 2015-02-12 MED ORDER — SODIUM CHLORIDE 0.9 % IV SOLN
Freq: Once | INTRAVENOUS | Status: AC
  Administered 2015-02-12: 14:00:00 via INTRAVENOUS

## 2015-02-12 MED ORDER — DEXTROSE 5 % IV SOLN
250.0000 mg/m2 | Freq: Once | INTRAVENOUS | Status: AC
  Administered 2015-02-12: 470 mg via INTRAVENOUS
  Filled 2015-02-12: qty 23.5

## 2015-02-12 MED ORDER — PROCHLORPERAZINE MALEATE 10 MG PO TABS
10.0000 mg | ORAL_TABLET | Freq: Once | ORAL | Status: AC
  Administered 2015-02-12: 10 mg via ORAL

## 2015-02-12 MED ORDER — FLUOROURACIL CHEMO INJECTION 5 GM/100ML
1500.0000 mg/m2 | INTRAVENOUS | Status: DC
  Administered 2015-02-12: 2800 mg via INTRAVENOUS
  Filled 2015-02-12: qty 56

## 2015-02-12 MED ORDER — EPOETIN ALFA 20000 UNIT/ML IJ SOLN
40000.0000 [IU] | Freq: Once | INTRAMUSCULAR | Status: DC
  Filled 2015-02-12: qty 2

## 2015-02-12 MED ORDER — SODIUM CHLORIDE 0.9 % IV SOLN
6.0000 mg/kg | Freq: Once | INTRAVENOUS | Status: AC
  Administered 2015-02-12: 500 mg via INTRAVENOUS
  Filled 2015-02-12: qty 25

## 2015-02-12 NOTE — Progress Notes (Signed)
[place] OFFICE PROGRESS NOTE   Diagnosis: Colon cancer  INTERVAL HISTORY:   Emily Lawson returns as scheduled. She reports malaise. She attending a wedding last weekend and developed diarrhea the following day. This has resolved. She feels "cold ".  Objective:  Vital signs in last 24 hours:  Blood pressure 146/58, pulse 67, temperature 98.2 F (36.8 C), temperature source Oral, resp. rate 18, height 5' 1"  (1.549 m), weight 175 lb 8 oz (79.606 kg).    HEENT: No thrush or ulcers Resp: Lungs with inspiratory rales at the lower chest bilaterally, no respiratory distress  Cardio:  Regular rate and rhythm GI:  No hepatomegaly, nontender Vascular:  No leg edema  Skin: no rash   Portacath/PICC-without erythema  Lab Results:  Lab Results  Component Value Date   WBC 6.7 02/12/2015   HGB 7.4* 02/12/2015   HCT 23.5* 02/12/2015   MCV 103.6* 02/12/2015   PLT 220 02/12/2015   NEUTROABS 4.4 02/12/2015    Lab Results  Component Value Date   CEA 8.0* 12/31/2014    Medications: I have reviewed the patient's current medications.  Assessment/Plan: 1. Metastatic colon cancer presenting with synchronous colon primaries in September, 2008, status post a hemicolectomy and liver biopsy 08/03/2007. A CT on 04/20/2010 revealed no evidence metastatic disease. She was maintained on 5-FU/leucovorin per the FOLFOX regimen beginning in April, 2009. Avasta was discontinued from the chemotherapy regimen beginning in November, 2009 due to nephrotic range proteinuria. A CT of the abdomen and pelvis 02/05/2011 revealed evidence of three small liver lesions. An MRI of the abdomen on 03/02/2011 confirmed 3 liver lesions and no additional evidence of metastatic disease. K-RAS testing on the transverse colon and left colon tumors revealed a wild type genotype. Foundation 1 testing revealed no K-ras, NRAS, or BRAF mutation. 1A. Staging PET scan 03/21/2011 confirmed 3 hypermetabolic liver  lesions and no additional evidence of metastatic disease.  1B. Surgical consultation by Dr. Barry Dienes was obtained and Ms. Boschert decided against an attempt at surgical resection/radiofrequency ablation.  1C. Salvage therapy with FOLFOX was initiated 04/13/2011.  1D. A restaging CT 06/28/2011 revealed a significant improvement in the hepatic metastasis.  1E. She completed eight cycles of FOLFOX chemotherapy. Treatment was switched to 5FU-leucovorin as per the FOLFOX regimen beginning with cycle #9 due to an allergic reaction (skin rash) following cycle #8.  19F. Restaging CT 10/25/2011 revealed 2 more conspicuous liver metastases .  1G. Restaging PET scan 12/24/2011 revealed an increase in the size and metabolic activity of 3 liver metastases  1H. She began FOLFIRI chemotherapy on 01/19/2012.  1 I. Restaging CT of the abdomen 05/08/2012 revealed a mixed response with enlargement of 2 liver lesions and a decrease in the size of a third lesion, no new lesions.  1J. FOLFIRI chemotherapy continued with the addition of Avastin beginning 05/09/2012.  1K. restaging CT 07/28/2012 with no new liver lesions and a decreased size of the previously noted 3 liver lesions.  1L. She was last treated with FOLFIRI/Avastin on 09/12/2012.  31M. She completed stereotactic radiotherapy to 3 liver metastases 10/10/2012 through 10/20/2012.  1N. Follow-up MRI of the abdomen 11/24/2012 showed improvement in the 3 hepatic metastases and no progressive disease.  1O. Restaging MRI 04/09/2013 showed improvement in the 3 treated hepatic lesions and 2 new lesions. No evidence of metastatic disease outside of liver.  1P. Initiation of Xeloda/Avastin 05/30/2013. Cycle 2 initiated on 06/20/2013 (Avastin placed on hold)-Xeloda discontinued after 10 days secondary to hand/foot syndrome. Cycle  3 Xeloda question initiated on 07/28/2013 (she is unable to recall when she started or stopped cycle 3). Cycle 4 08/20/2013. Cycle 5  09/14/2013 (no Xeloda). Cycle 6 Xeloda/Avastin 10/01/2013. Cycle 7 Xeloda/Avastin 11/02/2013  1Q. restaging MRI of the liver 11/16/2013-3 enhancing right liver lesion, slightly larger. No evidence of metastasis in the left hepatic lobe.  1R. Xeloda/Avastin continued.  1S. restaging PET scan 01/28/2014 showed 2 hypermetabolic lesions in the liver corresponding to the abnormality seen on the recent MRI. The third lesion seen on the recent MRI was not discernible on the PET. No other evidence for hypermetabolic metastases.  [name] continued.  1U. CT abdomen/pelvis 06/14/2014 with new moderate ascites; large infiltrating lesions again noted within the right hepatic lobe difficult to fully assess on CT. Mildly increased hepatic atrophy with regard to the 2 larger lesions. A smaller lobulated lesion had increased mildly in size. 1V. CT chest/abdomen 09/09/2014 with progression of multifocal hepatic metastases. No evidence of metastatic disease in the chest. 1W. Initiation of 5-fluorouracil/Panitumumab on a 2 week schedule 09/10/2014. 1X. CT 01/20/2015 with likely mild improvement in multifocal hepatic metastases. 1Y. Continuation of 5-FU/Panitumumab on a 3 week schedule   1. Diabetes. 2. History of proteinuria secondary to Avastin versus diabetes. 3. Mucositis and hand foot syndrome, secondary to 5FU improved with a dose reduction of the 5-FU. 4. 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 Left back and left leg pain with an MRI of the lumbar spine confirming lumbar disk disease. She is status post surgery by Dr. Christella Noa 02/14/2008 with resolution of the back and left leg pain. 6. Rosacea. She is followed by dermatology.  7. Depression, maintained on Cymbalta 8. History of bilateral knee pain secondary to arthritis -- she takes Vicodin as needed. 9. History of anorexia -- weight loss -- ?Related to depression/anxiety or metastatic colon cancer. 10. Allergic  reaction to oxaliplatin with FOLFOX on 04/13/2011. She was able to tolerate FOLFOX on 04/20/2011 with steroid premedication and a prolonged oxaliplatin infusion. After cycle #8 she developed a rash upon returning home with pruritus. She took Benadryl with resolution of symptoms. This reaction occurred despite steroid premedication. 11. Poor dentition -- she was evaluated by Dr. Enrique Sack. She underwent tooth extractions 07/04/2013. She now has dentures.  12. Neutropenia following cycle 1 of FOLFIRI. The FOLFIRI was dose reduced beginning with cycle 2. She was neutropenic 03/14/2012 and chemotherapy was held for one week.  13. Nausea and diarrhea during the irinotecan infusion on 03/21/2012. Symptoms resolved with atropine. She receives atropine as a premedication with each cycle. 14. Diarrhea following the 05/09/2012 chemotherapy cycle. Likely related to irinotecan. She takes Imodium as needed. 15. History of Diffuse pain 4-5 days after chemotherapy 07/17/2012-likely toxicity from Neulasta. 16. History of a fall with fracture of the right fifth metacarpal. 17. Hand/foot syndrome secondary to Xeloda-the Xeloda was dose reduced beginning with cycle 3 on 07/23/2013 (?). 18. Hypomagnesemia secondary to Panitumumab. 19.    Disposition:  Ms. Viall has developed progressive anemia. This is likely secondary to renal insufficiency, chronic disease, and chemotherapy. There is no clinical evidence for progression of the colon cancer. The plan is to continue 5 fluorouracil and panitumumab. She will continue iron. I discussed the risks/benefits of erythropoietin therapy with her today. She agrees to proceed with a trial of weekly Procrit. We will check baseline iron studies and an erythropoietin level today.  She will be scheduled for an office visit and chemotherapy in 3 weeks.  Betsy Coder, MD  02/12/2015  12:26 PM

## 2015-02-12 NOTE — Patient Instructions (Signed)
[facility] Discharge Instructions for Patients Receiving Chemotherapy  Today you received the following chemotherapy agents Vectabix.  To help prevent nausea and vomiting after your treatment, we encourage you to take your nausea medication as prescribed.   If you develop nausea and vomiting that is not controlled by your nausea medication, call the clinic.   BELOW ARE SYMPTOMS THAT SHOULD BE REPORTED IMMEDIATELY:  *FEVER GREATER THAN 100.5 F  *CHILLS WITH OR WITHOUT FEVER  NAUSEA AND VOMITING THAT IS NOT CONTROLLED WITH YOUR NAUSEA MEDICATION  *UNUSUAL SHORTNESS OF BREATH  *UNUSUAL BRUISING OR BLEEDING  TENDERNESS IN MOUTH AND THROAT WITH OR WITHOUT PRESENCE OF ULCERS  *URINARY PROBLEMS  *BOWEL PROBLEMS  UNUSUAL RASH Items with * indicate a potential emergency and should be followed up as soon as possible.  Feel free to call the clinic you have any questions or concerns. The clinic phone number is [phone].  Please show the [facility] at check to the Emergency Department and triage nurse.

## 2015-02-12 NOTE — Progress Notes (Signed)
Proceed with treatment today per Dr.G.Sherrill/Amy RN. MD aware of labs.

## 2015-02-13 ENCOUNTER — Telehealth: Payer: Self-pay | Admitting: *Deleted

## 2015-02-13 MED ORDER — FERROUS SULFATE 325 (65 FE) MG PO TBEC: 325.0000 mg | DELAYED_RELEASE_TABLET | Freq: Three times a day (TID) | ORAL | Status: DC

## 2015-02-13 NOTE — Telephone Encounter (Signed)
-----   Message from Ladell Pier, MD sent at 02/12/2015  3:53 PM EDT ----- Please call patient, increase iron to twice daily

## 2015-02-13 NOTE — Telephone Encounter (Signed)
Called pt with instructions to increase iron to BID. It was unclear whether she understood instructions. Called daughter, Elane Fritz with instructions. She wrote them down, verbalized understanding.

## 2015-02-14 ENCOUNTER — Encounter: Payer: Self-pay | Admitting: Medical Oncology

## 2015-02-14 ENCOUNTER — Ambulatory Visit (HOSPITAL_BASED_OUTPATIENT_CLINIC_OR_DEPARTMENT_OTHER): Payer: PPO

## 2015-02-14 DIAGNOSIS — Z452 Encounter for adjustment and management of vascular access device: Secondary | ICD-10-CM

## 2015-02-14 DIAGNOSIS — C188 Malignant neoplasm of overlapping sites of colon: Secondary | ICD-10-CM

## 2015-02-14 DIAGNOSIS — C189 Malignant neoplasm of colon, unspecified: Secondary | ICD-10-CM

## 2015-02-14 DIAGNOSIS — C787 Secondary malignant neoplasm of liver and intrahepatic bile duct: Secondary | ICD-10-CM

## 2015-02-14 LAB — ERYTHROPOIETIN: ERYTHROPOIETIN: 85.1 m[IU]/mL — AB (ref 2.6–18.5)

## 2015-02-14 MED ORDER — SODIUM CHLORIDE 0.9 % IJ SOLN
10.0000 mL | INTRAMUSCULAR | Status: DC | PRN
  Administered 2015-02-14: 10 mL
  Filled 2015-02-14: qty 10

## 2015-02-14 MED ORDER — HEPARIN SOD (PORK) LOCK FLUSH 100 UNIT/ML IV SOLN
500.0000 [IU] | Freq: Once | INTRAVENOUS | Status: AC | PRN
  Administered 2015-02-14: 500 [IU]
  Filled 2015-02-14: qty 5

## 2015-02-14 NOTE — Patient Instructions (Signed)
Fluorouracil, 5FU; Diclofenac topical cream What is this medicine? FLUOROURACIL; DICLOFENAC (flure oh YOOR a sil; dye KLOE fen ak) is a combination of a topical chemotherapy agent and non-steroidal anti-inflammatory drug (NSAID). It is used on the skin to treat skin cancer and skin conditions that could become cancer. This medicine may be used for other purposes; ask your health care provider or pharmacist if you have questions. COMMON BRAND NAME(S): FLUORAC What should I tell my health care provider before I take this medicine? They need to know if you have any of these conditions: -bleeding problems -cigarette smoker -DPD enzyme deficiency -heart disease -high blood pressure -if you frequently drink alcohol containing drinks -kidney disease -liver disease -open or infected skin -stomach problems -swelling or open sores at the treatment site -recent or planned coronary artery bypass graft (CABG) surgery -an unusual or allergic reaction to fluorouracil, diclofenac, aspirin, other NSAIDs, other medicines, foods, dyes, or preservatives -pregnant or trying to get pregnant -breast-feeding How should I use this medicine? This medicine is only for use on the skin. Follow the directions on the prescription label. Wash hands before and after use. Wash affected area and gently pat dry. To apply this medicine use a cotton-tipped applicator, or use gloves if applying with fingertips. If applied with unprotected fingertips, it is very important to wash your hands well after you apply this medicine. Avoid applying to the eyes, nose, or mouth. Apply enough medicine to cover the affected area. You can cover the area with a light gauze dressing, but do not use tight or air-tight dressings. Finish the full course prescribed by your doctor or health care professional, even if you think your condition is better. Do not stop taking except on the advice of your doctor or health care professional. Talk to your  pediatrician regarding the use of this medicine in children. Special care may be needed. Overdosage: If you think you've taken too much of this medicine contact a poison control center or emergency room at once. Overdosage: If you think you have taken too much of this medicine contact a poison control center or emergency room at once. NOTE: This medicine is only for you. Do not share this medicine with others. What if I miss a dose? If you miss a dose, apply it as soon as you can. If it is almost time for your next dose, only use that dose. Do not apply extra doses. Contact your doctor or health care professional if you miss more than one dose. What may interact with this medicine? Interactions are not expected. Do not use any other skin products without telling your doctor or health care professional. This list may not describe all possible interactions. Give your health care provider a list of all the medicines, herbs, non-prescription drugs, or dietary supplements you use. Also tell them if you smoke, drink alcohol, or use illegal drugs. Some items may interact with your medicine. What should I watch for while using this medicine? Visit your doctor or health care professional for checks on your progress. You will need to use this medicine for 2 to 6 weeks. This may be longer depending on the condition being treated. You may not see full healing for another 1 to 2 months after you stop using the medicine. Treated areas of skin can look unsightly during and for several weeks after treatment with this medicine. This medicine can make you more sensitive to the sun. Keep out of the sun. If you cannot avoid being in   the sun, wear protective clothing and use sunscreen. Do not use sun lamps or tanning beds/booths. Where should I keep my What side effects may I notice from receiving this medicine? Side effects that you should report to your doctor or health care professional as soon as possible: -allergic  reactions like skin rash, itching or hives, swelling of the face, lips, or tongue -black or bloody stools, blood in the urine or vomit -blurred vision -chest pain -difficulty breathing or wheezing -redness, blistering, peeling or loosening of the skin, including inside the mouth -severe redness and swelling of normal skin -slurred speech or weakness on one side of the body -trouble passing urine or change in the amount of urine -unexplained weight gain or swelling -unusually weak or tired -yellowing of eyes or skin Side effects that usually do not require medical attention (Report these to your doctor or health care professional if they continue or are bothersome.): -increased sensitivity of the skin to sun and ultraviolet light -pain and burning of the affected area -scaling or swelling of the affected area -skin rash, itching of the affected area -tenderness This list may not describe all possible side effects. Call your doctor for medical advice about side effects. You may report side effects to FDA at 1-800-FDA-1088. Where should I keep my medicine? Keep out of the reach of children. Store at room temperature between 20 and 25 degrees C (68 and 77 degrees F). Throw away any unused medicine after the expiration date. NOTE: This sheet is a summary. It may not cover all possible information. If you have questions about this medicine, talk to your doctor, pharmacist, or health care provider.  2015, [employer]. (2014-03-18 11:09:58)  

## 2015-02-17 ENCOUNTER — Other Ambulatory Visit: Payer: Self-pay | Admitting: Medical Oncology

## 2015-02-17 DIAGNOSIS — C189 Malignant neoplasm of colon, unspecified: Secondary | ICD-10-CM

## 2015-02-19 ENCOUNTER — Ambulatory Visit (HOSPITAL_BASED_OUTPATIENT_CLINIC_OR_DEPARTMENT_OTHER): Payer: PPO

## 2015-02-19 ENCOUNTER — Ambulatory Visit: Payer: PPO

## 2015-02-19 ENCOUNTER — Other Ambulatory Visit: Payer: PPO

## 2015-02-19 ENCOUNTER — Other Ambulatory Visit (HOSPITAL_BASED_OUTPATIENT_CLINIC_OR_DEPARTMENT_OTHER): Payer: PPO

## 2015-02-19 DIAGNOSIS — C188 Malignant neoplasm of overlapping sites of colon: Secondary | ICD-10-CM

## 2015-02-19 DIAGNOSIS — D6481 Anemia due to antineoplastic chemotherapy: Secondary | ICD-10-CM

## 2015-02-19 DIAGNOSIS — D649 Anemia, unspecified: Secondary | ICD-10-CM

## 2015-02-19 DIAGNOSIS — C189 Malignant neoplasm of colon, unspecified: Secondary | ICD-10-CM

## 2015-02-19 DIAGNOSIS — C787 Secondary malignant neoplasm of liver and intrahepatic bile duct: Secondary | ICD-10-CM | POA: Diagnosis not present

## 2015-02-19 DIAGNOSIS — N19 Unspecified kidney failure: Secondary | ICD-10-CM

## 2015-02-19 LAB — CBC WITH DIFFERENTIAL/PLATELET
BASO%: 1.3 % (ref 0.0–2.0)
Basophils Absolute: 0.1 10*3/uL (ref 0.0–0.1)
EOS%: 5.2 % (ref 0.0–7.0)
Eosinophils Absolute: 0.4 10*3/uL (ref 0.0–0.5)
HEMATOCRIT: 23.7 % — AB (ref 34.8–46.6)
HGB: 7.5 g/dL — ABNORMAL LOW (ref 11.6–15.9)
LYMPH%: 19.2 % (ref 14.0–49.7)
MCH: 32.2 pg (ref 25.1–34.0)
MCHC: 31.6 g/dL (ref 31.5–36.0)
MCV: 102.2 fL — AB (ref 79.5–101.0)
MONO#: 0.5 10*3/uL (ref 0.1–0.9)
MONO%: 5.6 % (ref 0.0–14.0)
NEUT#: 5.5 10*3/uL (ref 1.5–6.5)
NEUT%: 68.7 % (ref 38.4–76.8)
PLATELETS: 214 10*3/uL (ref 145–400)
RBC: 2.32 10*6/uL — AB (ref 3.70–5.45)
RDW: 19.1 % — ABNORMAL HIGH (ref 11.2–14.5)
WBC: 8.1 10*3/uL (ref 3.9–10.3)
lymph#: 1.5 10*3/uL (ref 0.9–3.3)

## 2015-02-19 LAB — RESEARCH LABS

## 2015-02-19 MED ORDER — EPOETIN ALFA 20000 UNIT/ML IJ SOLN
40000.0000 [IU] | Freq: Once | INTRAMUSCULAR | Status: AC
  Administered 2015-02-19: 40000 [IU] via SUBCUTANEOUS
  Filled 2015-02-19: qty 2

## 2015-02-26 ENCOUNTER — Ambulatory Visit (HOSPITAL_BASED_OUTPATIENT_CLINIC_OR_DEPARTMENT_OTHER): Payer: PPO

## 2015-02-26 ENCOUNTER — Other Ambulatory Visit (HOSPITAL_BASED_OUTPATIENT_CLINIC_OR_DEPARTMENT_OTHER): Payer: PPO

## 2015-02-26 DIAGNOSIS — N19 Unspecified kidney failure: Secondary | ICD-10-CM | POA: Diagnosis not present

## 2015-02-26 DIAGNOSIS — D649 Anemia, unspecified: Secondary | ICD-10-CM

## 2015-02-26 LAB — CBC WITH DIFFERENTIAL/PLATELET
BASO%: 0.6 % (ref 0.0–2.0)
Basophils Absolute: 0 10*3/uL (ref 0.0–0.1)
EOS%: 6.4 % (ref 0.0–7.0)
Eosinophils Absolute: 0.4 10*3/uL (ref 0.0–0.5)
HEMATOCRIT: 24.7 % — AB (ref 34.8–46.6)
HGB: 7.8 g/dL — ABNORMAL LOW (ref 11.6–15.9)
LYMPH#: 1.6 10*3/uL (ref 0.9–3.3)
LYMPH%: 23.5 % (ref 14.0–49.7)
MCH: 32.1 pg (ref 25.1–34.0)
MCHC: 31.6 g/dL (ref 31.5–36.0)
MCV: 101.6 fL — ABNORMAL HIGH (ref 79.5–101.0)
MONO#: 0.5 10*3/uL (ref 0.1–0.9)
MONO%: 7.6 % (ref 0.0–14.0)
NEUT%: 61.9 % (ref 38.4–76.8)
NEUTROS ABS: 4.2 10*3/uL (ref 1.5–6.5)
Platelets: 229 10*3/uL (ref 145–400)
RBC: 2.43 10*6/uL — ABNORMAL LOW (ref 3.70–5.45)
RDW: 18.4 % — ABNORMAL HIGH (ref 11.2–14.5)
WBC: 6.8 10*3/uL (ref 3.9–10.3)

## 2015-02-26 MED ORDER — EPOETIN ALFA 20000 UNIT/ML IJ SOLN
40000.0000 [IU] | Freq: Once | INTRAMUSCULAR | Status: AC
  Administered 2015-02-26: 40000 [IU] via SUBCUTANEOUS
  Filled 2015-02-26: qty 2

## 2015-03-02 ENCOUNTER — Other Ambulatory Visit: Payer: Self-pay | Admitting: Oncology

## 2015-03-04 ENCOUNTER — Other Ambulatory Visit: Payer: Self-pay | Admitting: Oncology

## 2015-03-05 ENCOUNTER — Ambulatory Visit (HOSPITAL_BASED_OUTPATIENT_CLINIC_OR_DEPARTMENT_OTHER): Payer: PPO | Admitting: Nurse Practitioner

## 2015-03-05 ENCOUNTER — Ambulatory Visit: Payer: PPO

## 2015-03-05 ENCOUNTER — Other Ambulatory Visit (HOSPITAL_BASED_OUTPATIENT_CLINIC_OR_DEPARTMENT_OTHER): Payer: PPO

## 2015-03-05 ENCOUNTER — Ambulatory Visit (HOSPITAL_BASED_OUTPATIENT_CLINIC_OR_DEPARTMENT_OTHER): Payer: PPO

## 2015-03-05 ENCOUNTER — Telehealth: Payer: Self-pay | Admitting: Nurse Practitioner

## 2015-03-05 VITALS — BP 134/43 | HR 89 | Temp 97.9°F | Resp 18 | Ht 61.0 in | Wt 177.4 lb

## 2015-03-05 DIAGNOSIS — D638 Anemia in other chronic diseases classified elsewhere: Secondary | ICD-10-CM

## 2015-03-05 DIAGNOSIS — D6481 Anemia due to antineoplastic chemotherapy: Secondary | ICD-10-CM

## 2015-03-05 DIAGNOSIS — C787 Secondary malignant neoplasm of liver and intrahepatic bile duct: Secondary | ICD-10-CM

## 2015-03-05 DIAGNOSIS — C189 Malignant neoplasm of colon, unspecified: Secondary | ICD-10-CM

## 2015-03-05 DIAGNOSIS — Z5111 Encounter for antineoplastic chemotherapy: Secondary | ICD-10-CM | POA: Diagnosis not present

## 2015-03-05 DIAGNOSIS — Z5112 Encounter for antineoplastic immunotherapy: Secondary | ICD-10-CM | POA: Diagnosis not present

## 2015-03-05 DIAGNOSIS — N289 Disorder of kidney and ureter, unspecified: Secondary | ICD-10-CM

## 2015-03-05 DIAGNOSIS — C188 Malignant neoplasm of overlapping sites of colon: Secondary | ICD-10-CM

## 2015-03-05 DIAGNOSIS — F329 Major depressive disorder, single episode, unspecified: Secondary | ICD-10-CM

## 2015-03-05 DIAGNOSIS — D649 Anemia, unspecified: Secondary | ICD-10-CM

## 2015-03-05 LAB — CBC WITH DIFFERENTIAL/PLATELET
BASO%: 0.7 % (ref 0.0–2.0)
Basophils Absolute: 0.1 10*3/uL (ref 0.0–0.1)
EOS%: 4.9 % (ref 0.0–7.0)
Eosinophils Absolute: 0.4 10*3/uL (ref 0.0–0.5)
HCT: 26.1 % — ABNORMAL LOW (ref 34.8–46.6)
HGB: 8 g/dL — ABNORMAL LOW (ref 11.6–15.9)
LYMPH%: 24.3 % (ref 14.0–49.7)
MCH: 30.9 pg (ref 25.1–34.0)
MCHC: 30.7 g/dL — ABNORMAL LOW (ref 31.5–36.0)
MCV: 100.8 fL (ref 79.5–101.0)
MONO#: 0.6 10*3/uL (ref 0.1–0.9)
MONO%: 7.8 % (ref 0.0–14.0)
NEUT#: 4.5 10*3/uL (ref 1.5–6.5)
NEUT%: 62.3 % (ref 38.4–76.8)
Platelets: 245 10*3/uL (ref 145–400)
RBC: 2.59 10*6/uL — AB (ref 3.70–5.45)
RDW: 18.2 % — ABNORMAL HIGH (ref 11.2–14.5)
WBC: 7.2 10*3/uL (ref 3.9–10.3)
lymph#: 1.8 10*3/uL (ref 0.9–3.3)
nRBC: 0 % (ref 0–0)

## 2015-03-05 LAB — COMPREHENSIVE METABOLIC PANEL (CC13)
ALBUMIN: 2.7 g/dL — AB (ref 3.5–5.0)
ALK PHOS: 334 U/L — AB (ref 40–150)
ALT: 17 U/L (ref 0–55)
AST: 33 U/L (ref 5–34)
Anion Gap: 19 mEq/L — ABNORMAL HIGH (ref 3–11)
BUN: 24 mg/dL (ref 7.0–26.0)
CO2: 21 mEq/L — ABNORMAL LOW (ref 22–29)
Calcium: 9.1 mg/dL (ref 8.4–10.4)
Chloride: 102 mEq/L (ref 98–109)
Creatinine: 1.5 mg/dL — ABNORMAL HIGH (ref 0.6–1.1)
EGFR: 35 mL/min/{1.73_m2} — AB (ref >90)
Glucose: 166 mg/dl — ABNORMAL HIGH (ref 70–140)
POTASSIUM: 4 meq/L (ref 3.5–5.1)
SODIUM: 142 meq/L (ref 136–145)
TOTAL PROTEIN: 6.7 g/dL (ref 6.4–8.3)
Total Bilirubin: 1.78 mg/dL — ABNORMAL HIGH (ref 0.20–1.20)

## 2015-03-05 LAB — MAGNESIUM (CC13): Magnesium: 1.1 mg/dl — CL (ref 1.5–2.5)

## 2015-03-05 MED ORDER — PROCHLORPERAZINE MALEATE 10 MG PO TABS
ORAL_TABLET | ORAL | Status: AC
  Filled 2015-03-05: qty 1

## 2015-03-05 MED ORDER — EPOETIN ALFA 40000 UNIT/ML IJ SOLN
40000.0000 [IU] | Freq: Once | INTRAMUSCULAR | Status: AC
  Administered 2015-03-05: 40000 [IU] via SUBCUTANEOUS
  Filled 2015-03-05: qty 1

## 2015-03-05 MED ORDER — SODIUM CHLORIDE 0.9 % IV SOLN
1500.0000 mg/m2 | INTRAVENOUS | Status: DC
  Administered 2015-03-05: 2800 mg via INTRAVENOUS
  Filled 2015-03-05: qty 56

## 2015-03-05 MED ORDER — FLUOROURACIL CHEMO INJECTION 500 MG/10ML
250.0000 mg/m2 | Freq: Once | INTRAVENOUS | Status: AC
  Administered 2015-03-05: 450 mg via INTRAVENOUS
  Filled 2015-03-05: qty 9

## 2015-03-05 MED ORDER — LEUCOVORIN CALCIUM INJECTION 350 MG
250.0000 mg/m2 | Freq: Once | INTRAVENOUS | Status: AC
  Administered 2015-03-05: 470 mg via INTRAVENOUS
  Filled 2015-03-05: qty 23.5

## 2015-03-05 MED ORDER — SODIUM CHLORIDE 0.9 % IV SOLN
6.0000 mg/kg | Freq: Once | INTRAVENOUS | Status: AC
  Administered 2015-03-05: 500 mg via INTRAVENOUS
  Filled 2015-03-05: qty 25

## 2015-03-05 MED ORDER — SODIUM CHLORIDE 0.9 % IV SOLN
Freq: Once | INTRAVENOUS | Status: AC
  Administered 2015-03-05: 12:00:00 via INTRAVENOUS

## 2015-03-05 MED ORDER — PROCHLORPERAZINE MALEATE 10 MG PO TABS
10.0000 mg | ORAL_TABLET | Freq: Once | ORAL | Status: AC
  Administered 2015-03-05: 10 mg via ORAL

## 2015-03-05 NOTE — Patient Instructions (Signed)
[facility] Discharge Instructions for Patients Receiving Chemotherapy  Today you received the following chemotherapy agents: Vectibix, Leucovorin, and Adrucil.   To help prevent nausea and vomiting after your treatment, we encourage you to take your nausea medication as directed.    If you develop nausea and vomiting that is not controlled by your nausea medication, call the clinic.   BELOW ARE SYMPTOMS THAT SHOULD BE REPORTED IMMEDIATELY:  *FEVER GREATER THAN 100.5 F  *CHILLS WITH OR WITHOUT FEVER  NAUSEA AND VOMITING THAT IS NOT CONTROLLED WITH YOUR NAUSEA MEDICATION  *UNUSUAL SHORTNESS OF BREATH  *UNUSUAL BRUISING OR BLEEDING  TENDERNESS IN MOUTH AND THROAT WITH OR WITHOUT PRESENCE OF ULCERS  *URINARY PROBLEMS  *BOWEL PROBLEMS  UNUSUAL RASH Items with * indicate a potential emergency and should be followed up as soon as possible.  Feel free to call the clinic you have any questions or concerns. The clinic phone number is [phone].  Please show the [place] at check-in to the Emergency Department and triage nurse.

## 2015-03-05 NOTE — Progress Notes (Signed)
Per Ned Card, NP okay to proceed with treatment today despite labs.

## 2015-03-05 NOTE — Telephone Encounter (Signed)
Appointments made and patient will get a new avs in chemo today

## 2015-03-05 NOTE — Progress Notes (Signed)
[facility] OFFICE PROGRESS NOTE   Diagnosis:  Colon cancer  INTERVAL HISTORY:   Emily Lawson returns as scheduled. She continues every three-week 5-FU/Panitumumab. She denies nausea/vomiting. She has a few mouth sores related to her dentures. She tends to note loose stools after eating berries. Otherwise no significant diarrhea. No skin rash. She has a bruise at the left abdomen at the site of a recent Procrit injection.  Objective:  Vital signs in last 24 hours:  Blood pressure 134/43, pulse 89, temperature 97.9 F (36.6 C), temperature source Oral, resp. rate 18, height 5' 1"  (1.549 m), weight 177 lb 6.4 oz (80.468 kg), SpO2 100 %.    HEENT: No thrush or ulcers. Resp: Lungs clear bilaterally. Cardio: Regular rate and rhythm. GI: Abdomen soft and nontender. No hepatomegaly. Large resolving ecchymosis left abdomen with a small hematoma located centrally. Vascular: Trace lower leg edema bilaterally.  Skin: No rash. Port-A-Cath without erythema.    Lab Results:  Lab Results  Component Value Date   WBC 7.2 03/05/2015   HGB 8.0* 03/05/2015   HCT 26.1* 03/05/2015   MCV 100.8 03/05/2015   PLT 245 03/05/2015   NEUTROABS 4.5 03/05/2015    Imaging:  No results found.  Medications: I have reviewed the patient's current medications.  Assessment/Plan: 1. Metastatic colon cancer presenting with synchronous colon primaries in September, 2008, status post a hemicolectomy and liver biopsy 08/03/2007. A CT on 04/20/2010 revealed no evidence metastatic disease. She was maintained on 5-FU/leucovorin per the FOLFOX regimen beginning in April, 2009. Avasta was discontinued from the chemotherapy regimen beginning in November, 2009 due to nephrotic range proteinuria. A CT of the abdomen and pelvis 02/05/2011 revealed evidence of three small liver lesions. An MRI of the abdomen on 03/02/2011 confirmed 3 liver lesions and no additional evidence of metastatic disease. K-RAS testing  on the transverse colon and left colon tumors revealed a wild type genotype. Foundation 1 testing revealed no K-ras, NRAS, or BRAF mutation. 1A. Staging PET scan 03/21/2011 confirmed 3 hypermetabolic liver lesions and no additional evidence of metastatic disease.  1B. Surgical consultation by Dr. Barry Dienes was obtained and [name] decided against an attempt at surgical resection/radiofrequency ablation.  1C. Salvage therapy with FOLFOX was initiated 04/13/2011.  1D. A restaging CT 06/28/2011 revealed a significant improvement in the hepatic metastasis.  1E. She completed eight cycles of FOLFOX chemotherapy. Treatment was switched to 5FU-leucovorin as per the FOLFOX regimen beginning with cycle #9 due to an allergic reaction (skin rash) following cycle #8.  35F. Restaging CT 10/25/2011 revealed 2 more conspicuous liver metastases .  1G. Restaging PET scan 12/24/2011 revealed an increase in the size and metabolic activity of 3 liver metastases  1H. She began FOLFIRI chemotherapy on 01/19/2012.  1 I. Restaging CT of the abdomen 05/08/2012 revealed a mixed response with enlargement of 2 liver lesions and a decrease in the size of a third lesion, no new lesions.  1J. FOLFIRI chemotherapy continued with the addition of Avastin beginning 05/09/2012.  1K. restaging CT 07/28/2012 with no new liver lesions and a decreased size of the previously noted 3 liver lesions.  1L. She was last treated with FOLFIRI/Avastin on 09/12/2012.  81M. She completed stereotactic radiotherapy to 3 liver metastases 10/10/2012 through 10/20/2012.  1N. Follow-up MRI of the abdomen 11/24/2012 showed improvement in the 3 hepatic metastases and no progressive disease.  1O. Restaging MRI 04/09/2013 showed improvement in the 3 treated hepatic lesions and 2 new lesions. No evidence of metastatic  disease outside of liver.  1P. Initiation of Xeloda/Avastin 05/30/2013. Cycle 2 initiated on 06/20/2013 (Avastin placed on  hold)-Xeloda discontinued after 10 days secondary to hand/foot syndrome. Cycle 3 Xeloda question initiated on 07/28/2013 (she is unable to recall when she started or stopped cycle 3). Cycle 4 08/20/2013. Cycle 5 09/14/2013 (no Xeloda). Cycle 6 Xeloda/Avastin 10/01/2013. Cycle 7 Xeloda/Avastin 11/02/2013  1Q. restaging MRI of the liver 11/16/2013-3 enhancing right liver lesion, slightly larger. No evidence of metastasis in the left hepatic lobe.  1R. Xeloda/Avastin continued.  1S. restaging PET scan 01/28/2014 showed 2 hypermetabolic lesions in the liver corresponding to the abnormality seen on the recent MRI. The third lesion seen on the recent MRI was not discernible on the PET. No other evidence for hypermetabolic metastases.  [name] continued.  1U. CT abdomen/pelvis 06/14/2014 with new moderate ascites; large infiltrating lesions again noted within the right hepatic lobe difficult to fully assess on CT. Mildly increased hepatic atrophy with regard to the 2 larger lesions. A smaller lobulated lesion had increased mildly in size. 1V. CT chest/abdomen 09/09/2014 with progression of multifocal hepatic metastases. No evidence of metastatic disease in the chest. 1W. Initiation of 5-fluorouracil/Panitumumab on a 2 week schedule 09/10/2014. 1X. CT 01/20/2015 with likely mild improvement in multifocal hepatic metastases. 1Y. Continuation of 5-FU/Panitumumab on a 3 week schedule   1. Diabetes. 2. History of proteinuria secondary to Avastin versus diabetes. 3. Mucositis and hand foot syndrome, secondary to 5FU improved with a dose reduction of the 5-FU. 4. 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 Left back and left leg pain with an MRI of the lumbar spine confirming lumbar disk disease. She is status post surgery by Dr. Christella Noa 02/14/2008 with resolution of the back and left leg pain. 6. Rosacea. She is followed by dermatology.  7. Depression, maintained on  Cymbalta 8. History of bilateral knee pain secondary to arthritis -- she takes Vicodin as needed. 9. History of anorexia -- weight loss -- ?Related to depression/anxiety or metastatic colon cancer. 10. Allergic reaction to oxaliplatin with FOLFOX on 04/13/2011. She was able to tolerate FOLFOX on 04/20/2011 with steroid premedication and a prolonged oxaliplatin infusion. After cycle #8 she developed a rash upon returning home with pruritus. She took Benadryl with resolution of symptoms. This reaction occurred despite steroid premedication. 11. Poor dentition -- she was evaluated by Dr. Enrique Sack. She underwent tooth extractions 07/04/2013. She now has dentures.  12. Neutropenia following cycle 1 of FOLFIRI. The FOLFIRI was dose reduced beginning with cycle 2. She was neutropenic 03/14/2012 and chemotherapy was held for one week.  13. Nausea and diarrhea during the irinotecan infusion on 03/21/2012. Symptoms resolved with atropine. She received atropine as a premedication with each cycle. 14. Diarrhea following the 05/09/2012 chemotherapy cycle. Likely related to irinotecan. She takes Imodium as needed. 15. History of Diffuse pain 4-5 days after chemotherapy 07/17/2012-likely toxicity from Neulasta. 16. History of a fall with fracture of the right fifth metacarpal. 17. Hand/foot syndrome secondary to Xeloda-the Xeloda was dose reduced beginning with cycle 3 on 07/23/2013 (?). 18. Hypomagnesemia secondary to Panitumumab. 19. Anemia, multifactorial likely secondary to renal insufficiency, chronic disease and chemotherapy. Trial of weekly Procrit initiated 02/12/2015.   Disposition: Ms. Fickel appears stable. Plan to continue 5-FU/Panitumumab every 3 weeks. We will follow-up on the CEA from today.  Hemoglobin is stable to slightly improved since beginning weekly erythropoietin injections. Plan to continue the same.  Magnesium continues to be low. This is likely secondary to  Panitumumab. It is not clear  that she is taking the oral magnesium. This was reviewed with her today and highlighted on her medication list.  She will return for a follow-up visit in 3 weeks. She will contact the office in the interim with any problems.  Plan reviewed with Dr. Benay Spice.  Ned Card ANP/GNP-BC   03/05/2015  11:12 AM

## 2015-03-06 LAB — CEA: CEA: 9.2 ng/mL — AB (ref 0.0–5.0)

## 2015-03-07 ENCOUNTER — Ambulatory Visit (HOSPITAL_BASED_OUTPATIENT_CLINIC_OR_DEPARTMENT_OTHER): Payer: PPO

## 2015-03-07 DIAGNOSIS — C188 Malignant neoplasm of overlapping sites of colon: Secondary | ICD-10-CM | POA: Diagnosis not present

## 2015-03-07 DIAGNOSIS — C189 Malignant neoplasm of colon, unspecified: Secondary | ICD-10-CM

## 2015-03-07 DIAGNOSIS — C787 Secondary malignant neoplasm of liver and intrahepatic bile duct: Secondary | ICD-10-CM | POA: Diagnosis not present

## 2015-03-07 MED ORDER — SODIUM CHLORIDE 0.9 % IJ SOLN
10.0000 mL | INTRAMUSCULAR | Status: DC | PRN
  Administered 2015-03-07: 10 mL
  Filled 2015-03-07: qty 10

## 2015-03-07 MED ORDER — HEPARIN SOD (PORK) LOCK FLUSH 100 UNIT/ML IV SOLN
500.0000 [IU] | Freq: Once | INTRAVENOUS | Status: AC | PRN
  Administered 2015-03-07: 500 [IU]
  Filled 2015-03-07: qty 5

## 2015-03-13 ENCOUNTER — Other Ambulatory Visit: Payer: Self-pay | Admitting: Nurse Practitioner

## 2015-03-13 ENCOUNTER — Ambulatory Visit (HOSPITAL_BASED_OUTPATIENT_CLINIC_OR_DEPARTMENT_OTHER): Payer: PPO

## 2015-03-13 ENCOUNTER — Telehealth: Payer: Self-pay | Admitting: *Deleted

## 2015-03-13 ENCOUNTER — Other Ambulatory Visit (HOSPITAL_BASED_OUTPATIENT_CLINIC_OR_DEPARTMENT_OTHER): Payer: PPO

## 2015-03-13 VITALS — BP 141/62 | HR 77 | Temp 98.2°F

## 2015-03-13 DIAGNOSIS — C188 Malignant neoplasm of overlapping sites of colon: Secondary | ICD-10-CM | POA: Diagnosis not present

## 2015-03-13 DIAGNOSIS — N189 Chronic kidney disease, unspecified: Secondary | ICD-10-CM

## 2015-03-13 DIAGNOSIS — C787 Secondary malignant neoplasm of liver and intrahepatic bile duct: Secondary | ICD-10-CM

## 2015-03-13 DIAGNOSIS — D631 Anemia in chronic kidney disease: Secondary | ICD-10-CM

## 2015-03-13 DIAGNOSIS — N289 Disorder of kidney and ureter, unspecified: Secondary | ICD-10-CM | POA: Diagnosis not present

## 2015-03-13 DIAGNOSIS — C189 Malignant neoplasm of colon, unspecified: Secondary | ICD-10-CM

## 2015-03-13 DIAGNOSIS — D649 Anemia, unspecified: Secondary | ICD-10-CM | POA: Diagnosis not present

## 2015-03-13 DIAGNOSIS — N19 Unspecified kidney failure: Secondary | ICD-10-CM

## 2015-03-13 DIAGNOSIS — D630 Anemia in neoplastic disease: Secondary | ICD-10-CM | POA: Diagnosis not present

## 2015-03-13 LAB — CBC WITH DIFFERENTIAL/PLATELET
BASO%: 0.5 % (ref 0.0–2.0)
Basophils Absolute: 0.1 10*3/uL (ref 0.0–0.1)
EOS%: 3.8 % (ref 0.0–7.0)
Eosinophils Absolute: 0.5 10*3/uL (ref 0.0–0.5)
HEMATOCRIT: 24.9 % — AB (ref 34.8–46.6)
HGB: 7.7 g/dL — ABNORMAL LOW (ref 11.6–15.9)
LYMPH%: 20 % (ref 14.0–49.7)
MCH: 31 pg (ref 25.1–34.0)
MCHC: 30.9 g/dL — ABNORMAL LOW (ref 31.5–36.0)
MCV: 100.4 fL (ref 79.5–101.0)
MONO#: 0.6 10*3/uL (ref 0.1–0.9)
MONO%: 4.7 % (ref 0.0–14.0)
NEUT#: 9.2 10*3/uL — ABNORMAL HIGH (ref 1.5–6.5)
NEUT%: 71 % (ref 38.4–76.8)
PLATELETS: 165 10*3/uL (ref 145–400)
RBC: 2.48 10*6/uL — AB (ref 3.70–5.45)
RDW: 18.2 % — ABNORMAL HIGH (ref 11.2–14.5)
WBC: 13 10*3/uL — ABNORMAL HIGH (ref 3.9–10.3)
lymph#: 2.6 10*3/uL (ref 0.9–3.3)
nRBC: 0 % (ref 0–0)

## 2015-03-13 MED ORDER — EPOETIN ALFA 40000 UNIT/ML IJ SOLN
40000.0000 [IU] | Freq: Once | INTRAMUSCULAR | Status: AC
  Administered 2015-03-13: 40000 [IU] via SUBCUTANEOUS
  Filled 2015-03-13: qty 1

## 2015-03-13 NOTE — Telephone Encounter (Signed)
Patient called and moved her appt to later today

## 2015-03-14 NOTE — Progress Notes (Signed)
HGB 7.7 today.  Patient doesn't feel like she needs a blood transfusion.  MD aware and agreeable with this.  Emily Lawson is aware that she is to call if she become more SOB or fatigued and feels like she needs blood before her nest appointment.

## 2015-03-20 ENCOUNTER — Ambulatory Visit (HOSPITAL_BASED_OUTPATIENT_CLINIC_OR_DEPARTMENT_OTHER): Payer: PPO

## 2015-03-20 ENCOUNTER — Other Ambulatory Visit (HOSPITAL_BASED_OUTPATIENT_CLINIC_OR_DEPARTMENT_OTHER): Payer: PPO

## 2015-03-20 VITALS — BP 155/55 | HR 79 | Temp 98.6°F

## 2015-03-20 DIAGNOSIS — N19 Unspecified kidney failure: Secondary | ICD-10-CM

## 2015-03-20 DIAGNOSIS — D631 Anemia in chronic kidney disease: Secondary | ICD-10-CM

## 2015-03-20 DIAGNOSIS — N289 Disorder of kidney and ureter, unspecified: Secondary | ICD-10-CM

## 2015-03-20 DIAGNOSIS — D649 Anemia, unspecified: Secondary | ICD-10-CM

## 2015-03-20 DIAGNOSIS — C188 Malignant neoplasm of overlapping sites of colon: Secondary | ICD-10-CM | POA: Diagnosis not present

## 2015-03-20 DIAGNOSIS — N189 Chronic kidney disease, unspecified: Secondary | ICD-10-CM

## 2015-03-20 DIAGNOSIS — D630 Anemia in neoplastic disease: Secondary | ICD-10-CM | POA: Diagnosis not present

## 2015-03-20 LAB — CBC WITH DIFFERENTIAL/PLATELET
BASO%: 0.7 % (ref 0.0–2.0)
Basophils Absolute: 0.1 10*3/uL (ref 0.0–0.1)
EOS%: 5.6 % (ref 0.0–7.0)
Eosinophils Absolute: 0.4 10*3/uL (ref 0.0–0.5)
HEMATOCRIT: 24.9 % — AB (ref 34.8–46.6)
HEMOGLOBIN: 7.6 g/dL — AB (ref 11.6–15.9)
LYMPH#: 1.8 10*3/uL (ref 0.9–3.3)
LYMPH%: 25.7 % (ref 14.0–49.7)
MCH: 30.3 pg (ref 25.1–34.0)
MCHC: 30.5 g/dL — AB (ref 31.5–36.0)
MCV: 99.2 fL (ref 79.5–101.0)
MONO#: 0.5 10*3/uL (ref 0.1–0.9)
MONO%: 7.9 % (ref 0.0–14.0)
NEUT#: 4.1 10*3/uL (ref 1.5–6.5)
NEUT%: 60.1 % (ref 38.4–76.8)
Platelets: 229 10*3/uL (ref 145–400)
RBC: 2.51 10*6/uL — ABNORMAL LOW (ref 3.70–5.45)
RDW: 19.8 % — AB (ref 11.2–14.5)
WBC: 6.8 10*3/uL (ref 3.9–10.3)
nRBC: 0 % (ref 0–0)

## 2015-03-20 LAB — TECHNOLOGIST REVIEW

## 2015-03-20 MED ORDER — EPOETIN ALFA 20000 UNIT/ML IJ SOLN
40000.0000 [IU] | Freq: Once | INTRAMUSCULAR | Status: AC
  Administered 2015-03-20: 40000 [IU] via SUBCUTANEOUS
  Filled 2015-03-20: qty 2

## 2015-03-20 NOTE — Progress Notes (Signed)
Not feeling any worse than last week.  Doesn't feel like she needs a blood transfusion today.  Will see Ned Card, NP next week as scheduled

## 2015-03-23 ENCOUNTER — Other Ambulatory Visit: Payer: Self-pay | Admitting: Oncology

## 2015-03-26 ENCOUNTER — Ambulatory Visit (HOSPITAL_BASED_OUTPATIENT_CLINIC_OR_DEPARTMENT_OTHER): Payer: PPO | Admitting: Nurse Practitioner

## 2015-03-26 ENCOUNTER — Telehealth: Payer: Self-pay | Admitting: Oncology

## 2015-03-26 ENCOUNTER — Ambulatory Visit (HOSPITAL_BASED_OUTPATIENT_CLINIC_OR_DEPARTMENT_OTHER): Payer: PPO

## 2015-03-26 ENCOUNTER — Other Ambulatory Visit (HOSPITAL_BASED_OUTPATIENT_CLINIC_OR_DEPARTMENT_OTHER): Payer: PPO

## 2015-03-26 VITALS — BP 146/49 | HR 89 | Temp 98.3°F | Resp 18 | Ht 61.0 in | Wt 183.7 lb

## 2015-03-26 DIAGNOSIS — C787 Secondary malignant neoplasm of liver and intrahepatic bile duct: Secondary | ICD-10-CM

## 2015-03-26 DIAGNOSIS — C189 Malignant neoplasm of colon, unspecified: Secondary | ICD-10-CM

## 2015-03-26 DIAGNOSIS — C188 Malignant neoplasm of overlapping sites of colon: Secondary | ICD-10-CM | POA: Diagnosis not present

## 2015-03-26 DIAGNOSIS — K469 Unspecified abdominal hernia without obstruction or gangrene: Secondary | ICD-10-CM

## 2015-03-26 DIAGNOSIS — Z5111 Encounter for antineoplastic chemotherapy: Secondary | ICD-10-CM

## 2015-03-26 DIAGNOSIS — Z5112 Encounter for antineoplastic immunotherapy: Secondary | ICD-10-CM

## 2015-03-26 DIAGNOSIS — D649 Anemia, unspecified: Secondary | ICD-10-CM

## 2015-03-26 LAB — COMPREHENSIVE METABOLIC PANEL (CC13)
ALT: 12 U/L (ref 0–55)
AST: 26 U/L (ref 5–34)
Albumin: 2.5 g/dL — ABNORMAL LOW (ref 3.5–5.0)
Alkaline Phosphatase: 248 U/L — ABNORMAL HIGH (ref 40–150)
Anion Gap: 17 mEq/L — ABNORMAL HIGH (ref 3–11)
BILIRUBIN TOTAL: 1.46 mg/dL — AB (ref 0.20–1.20)
BUN: 20.3 mg/dL (ref 7.0–26.0)
CALCIUM: 9.5 mg/dL (ref 8.4–10.4)
CHLORIDE: 104 meq/L (ref 98–109)
CO2: 19 meq/L — AB (ref 22–29)
Creatinine: 1.4 mg/dL — ABNORMAL HIGH (ref 0.6–1.1)
EGFR: 36 mL/min/{1.73_m2} — ABNORMAL LOW (ref >90)
GLUCOSE: 160 mg/dL — AB (ref 70–140)
Potassium: 4.2 mEq/L (ref 3.5–5.1)
Sodium: 140 mEq/L (ref 136–145)
Total Protein: 6 g/dL — ABNORMAL LOW (ref 6.4–8.3)

## 2015-03-26 LAB — CBC WITH DIFFERENTIAL/PLATELET
BASO%: 0.5 % (ref 0.0–2.0)
Basophils Absolute: 0 10*3/uL (ref 0.0–0.1)
EOS%: 4 % (ref 0.0–7.0)
Eosinophils Absolute: 0.3 10*3/uL (ref 0.0–0.5)
HCT: 26.4 % — ABNORMAL LOW (ref 34.8–46.6)
HEMOGLOBIN: 8 g/dL — AB (ref 11.6–15.9)
LYMPH#: 2.3 10*3/uL (ref 0.9–3.3)
LYMPH%: 30.8 % (ref 14.0–49.7)
MCH: 30.1 pg (ref 25.1–34.0)
MCHC: 30.3 g/dL — AB (ref 31.5–36.0)
MCV: 99.2 fL (ref 79.5–101.0)
MONO#: 0.8 10*3/uL (ref 0.1–0.9)
MONO%: 10.8 % (ref 0.0–14.0)
NEUT#: 4 10*3/uL (ref 1.5–6.5)
NEUT%: 53.9 % (ref 38.4–76.8)
Platelets: 283 10*3/uL (ref 145–400)
RBC: 2.66 10*6/uL — ABNORMAL LOW (ref 3.70–5.45)
RDW: 19.9 % — ABNORMAL HIGH (ref 11.2–14.5)
WBC: 7.4 10*3/uL (ref 3.9–10.3)

## 2015-03-26 LAB — MAGNESIUM (CC13): Magnesium: 1.2 mg/dl — CL (ref 1.5–2.5)

## 2015-03-26 LAB — CEA: CEA: 9.3 ng/mL — ABNORMAL HIGH (ref 0.0–5.0)

## 2015-03-26 MED ORDER — SODIUM CHLORIDE 0.9 % IV SOLN
6.0000 mg/kg | Freq: Once | INTRAVENOUS | Status: AC
  Administered 2015-03-26: 500 mg via INTRAVENOUS
  Filled 2015-03-26: qty 25

## 2015-03-26 MED ORDER — LEUCOVORIN CALCIUM INJECTION 350 MG
250.0000 mg/m2 | Freq: Once | INTRAVENOUS | Status: AC
  Administered 2015-03-26: 470 mg via INTRAVENOUS
  Filled 2015-03-26: qty 23.5

## 2015-03-26 MED ORDER — PROCHLORPERAZINE MALEATE 10 MG PO TABS
10.0000 mg | ORAL_TABLET | Freq: Once | ORAL | Status: AC
  Administered 2015-03-26: 10 mg via ORAL

## 2015-03-26 MED ORDER — SODIUM CHLORIDE 0.9 % IV SOLN
Freq: Once | INTRAVENOUS | Status: AC
  Administered 2015-03-26: 12:00:00 via INTRAVENOUS

## 2015-03-26 MED ORDER — PROCHLORPERAZINE MALEATE 10 MG PO TABS
ORAL_TABLET | ORAL | Status: AC
  Filled 2015-03-26: qty 1

## 2015-03-26 MED ORDER — FLUOROURACIL CHEMO INJECTION 500 MG/10ML
250.0000 mg/m2 | Freq: Once | INTRAVENOUS | Status: AC
  Administered 2015-03-26: 450 mg via INTRAVENOUS
  Filled 2015-03-26: qty 9

## 2015-03-26 MED ORDER — SODIUM CHLORIDE 0.9 % IV SOLN
1500.0000 mg/m2 | INTRAVENOUS | Status: DC
  Administered 2015-03-26: 2800 mg via INTRAVENOUS
  Filled 2015-03-26: qty 56

## 2015-03-26 NOTE — Progress Notes (Addendum)
[facility] OFFICE PROGRESS NOTE   Diagnosis:  Colon cancer  INTERVAL HISTORY:   Emily Lawson returns as scheduled. She continues every three-week 5-FU/Panitumumab. She has occasional mild nausea. At times she has mouth sores related to her dentures. No diarrhea. She feels that her abdomen is "puffed up". She has noted this for 2-3 weeks. No significant abdominal pain. No shortness of breath.  Objective:  Vital signs in last 24 hours:  Blood pressure 146/49, pulse 89, temperature 98.3 F (36.8 C), temperature source Oral, resp. rate 18, height _0  (1.549 m), weight 183 lb 11.2 oz (83.326 kg), SpO2 100 %.    HEENT: No thrush or ulcers. Resp: Lungs clear bilaterally. Cardio: Regular rate and rhythm. GI: Abdomen is soft, question mild distention. No hepatomegaly. Ventral hernia. Vascular: 1+ pitting edema at the lower legs bilaterally. Neuro: Alert and oriented.  Port-A-Cath without erythema.    Lab Results:  Lab Results  Component Value Date   WBC 7.4 03/26/2015   HGB 8.0* 03/26/2015   HCT 26.4* 03/26/2015   MCV 99.2 03/26/2015   PLT 283 03/26/2015   NEUTROABS 4.0 03/26/2015    Imaging:  No results found.  Medications: I have reviewed the patient's current medications.  Assessment/Plan: 1. Metastatic colon cancer presenting with synchronous colon primaries in September, 2008, status post a hemicolectomy and liver biopsy 08/03/2007. A CT on 04/20/2010 revealed no evidence metastatic disease. She was maintained on 5-FU/leucovorin per the FOLFOX regimen beginning in April, 2009. Avasta was discontinued from the chemotherapy regimen beginning in November, 2009 due to nephrotic range proteinuria. A CT of the abdomen and pelvis 02/05/2011 revealed evidence of three small liver lesions. An MRI of the abdomen on 03/02/2011 confirmed 3 liver lesions and no additional evidence of metastatic disease. K-RAS testing on the transverse colon and left colon tumors revealed  a wild type genotype. Foundation 1 testing revealed no K-ras, NRAS, or BRAF mutation. 1A. Staging PET scan 03/21/2011 confirmed 3 hypermetabolic liver lesions and no additional evidence of metastatic disease.  1B. Surgical consultation by Dr. Barry Dienes was obtained and Emily Lawson decided against an attempt at surgical resection/radiofrequency ablation.  1C. Salvage therapy with FOLFOX was initiated 04/13/2011.  1D. A restaging CT 06/28/2011 revealed a significant improvement in the hepatic metastasis.  1E. She completed eight cycles of FOLFOX chemotherapy. Treatment was switched to 5FU-leucovorin as per the FOLFOX regimen beginning with cycle #9 due to an allergic reaction (skin rash) following cycle #8.  73F. Restaging CT 10/25/2011 revealed 2 more conspicuous liver metastases .  1G. Restaging PET scan 12/24/2011 revealed an increase in the size and metabolic activity of 3 liver metastases  1H. She began FOLFIRI chemotherapy on 01/19/2012.  1 I. Restaging CT of the abdomen 05/08/2012 revealed a mixed response with enlargement of 2 liver lesions and a decrease in the size of a third lesion, no new lesions.  1J. FOLFIRI chemotherapy continued with the addition of Avastin beginning 05/09/2012.  1K. restaging CT 07/28/2012 with no new liver lesions and a decreased size of the previously noted 3 liver lesions.  1L. She was last treated with FOLFIRI/Avastin on 09/12/2012.  18M. She completed stereotactic radiotherapy to 3 liver metastases 10/10/2012 through 10/20/2012.  1N. Follow-up MRI of the abdomen 11/24/2012 showed improvement in the 3 hepatic metastases and no progressive disease.  1O. Restaging MRI 04/09/2013 showed improvement in the 3 treated hepatic lesions and 2 new lesions. No evidence of metastatic disease outside of liver.  1P. Initiation of  Xeloda/Avastin 05/30/2013. Cycle 2 initiated on 06/20/2013 (Avastin placed on hold)-Xeloda discontinued after 10 days secondary to hand/foot  syndrome. Cycle 3 Xeloda question initiated on 07/28/2013 (she is unable to recall when she started or stopped cycle 3). Cycle 4 08/20/2013. Cycle 5 09/14/2013 (no Xeloda). Cycle 6 Xeloda/Avastin 10/01/2013. Cycle 7 Xeloda/Avastin 11/02/2013  1Q. restaging MRI of the liver 11/16/2013-3 enhancing right liver lesion, slightly larger. No evidence of metastasis in the left hepatic lobe.  1R. Xeloda/Avastin continued.  1S. restaging PET scan 01/28/2014 showed 2 hypermetabolic lesions in the liver corresponding to the abnormality seen on the recent MRI. The third lesion seen on the recent MRI was not discernible on the PET. No other evidence for hypermetabolic metastases.  Emily Lawson continued.  1U. CT abdomen/pelvis 06/14/2014 with new moderate ascites; large infiltrating lesions again noted within the right hepatic lobe difficult to fully assess on CT. Mildly increased hepatic atrophy with regard to the 2 larger lesions. A smaller lobulated lesion had increased mildly in size. 1V. CT chest/abdomen 09/09/2014 with progression of multifocal hepatic metastases. No evidence of metastatic disease in the chest. 1W. Initiation of 5-fluorouracil/Panitumumab on a 2 week schedule 09/10/2014. 1X. CT 01/20/2015 with likely mild improvement in multifocal hepatic metastases. 1Y. Continuation of 5-FU/Panitumumab on a 3 week schedule   1. Diabetes. 2. History of proteinuria secondary to Avastin versus diabetes. 3. Mucositis and hand foot syndrome, secondary to 5FU improved with a dose reduction of the 5-FU. 4. 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 Left back and left leg pain with an MRI of the lumbar spine confirming lumbar disk disease. She is status post surgery by Dr. Christella Noa 02/14/2008 with resolution of the back and left leg pain. 6. Rosacea. She is followed by dermatology.  7. Depression, maintained on Cymbalta 8. History of bilateral knee pain secondary to arthritis  -- she takes Vicodin as needed. 9. History of anorexia -- weight loss -- ?Related to depression/anxiety or metastatic colon cancer. 10. Allergic reaction to oxaliplatin with FOLFOX on 04/13/2011. She was able to tolerate FOLFOX on 04/20/2011 with steroid premedication and a prolonged oxaliplatin infusion. After cycle #8 she developed a rash upon returning home with pruritus. She took Benadryl with resolution of symptoms. This reaction occurred despite steroid premedication. 11. Poor dentition -- she was evaluated by Dr. Enrique Sack. She underwent tooth extractions 07/04/2013. She now has dentures.  12. Neutropenia following cycle 1 of FOLFIRI. The FOLFIRI was dose reduced beginning with cycle 2. She was neutropenic 03/14/2012 and chemotherapy was held for one week.  13. Nausea and diarrhea during the irinotecan infusion on 03/21/2012. Symptoms resolved with atropine. She received atropine as a premedication with each cycle. 14. Diarrhea following the 05/09/2012 chemotherapy cycle. Likely related to irinotecan. She takes Imodium as needed. 15. History of Diffuse pain 4-5 days after chemotherapy 07/17/2012-likely toxicity from Neulasta. 16. History of a fall with fracture of the right fifth metacarpal. 17. Hand/foot syndrome secondary to Xeloda-the Xeloda was dose reduced beginning with cycle 3 on 07/23/2013 (?). 18. Hypomagnesemia secondary to Panitumumab. 19. Anemia, multifactorial likely secondary to renal insufficiency, chronic disease and chemotherapy. Trial of weekly Procrit initiated 02/12/2015. 03/26/2015 hemoglobin unchanged and Procrit discontinued.     Disposition: Emily Lawson appears stable. She will continue every 3 week 5-FU/Panitumumab. We will follow-up on the CEA from today.  She appears to have an abdominal hernia which is soft and reducible. She may also have ascites. We are referring her for an abdominal ultrasound with plans  for a paracentesis with fluid to be sent for cytology if  ascites is confirmed.  The hemoglobin has not responded to a trial of Procrit. The anemia is likely related to chemotherapy and chronic disease. We are discontinuing Procrit.  Emily Lawson will return for a follow-up visit and 5-FU/Panitumumab in 3 weeks. She will contact the office in the interim with any problems.  Patient seen with Dr. Benay Spice.      Ned Card ANP/GNP-BC   03/26/2015  10:35 AM  This was a shared visit with Ned Card. Emily Lawson was interviewed and examined. She appears to have a ventral hernia. We will send her for an abdominal ultrasound to see if there is significant ascites.  Julieanne Manson, M.D.

## 2015-03-26 NOTE — Telephone Encounter (Signed)
Pt confirmed labs/ov per 04/27 POF, gave pt AVS and Calendar..... KJ, sent msg to add chemo and scheduled [place]

## 2015-03-26 NOTE — Patient Instructions (Signed)
[facility] Discharge Instructions for Patients Receiving Chemotherapy  Today you received the following chemotherapy agents vectibix, leucovorin, 5FU  To help prevent nausea and vomiting after your treatment, we encourage you to take your nausea medication as prescribed.    If you develop nausea and vomiting that is not controlled by your nausea medication, call the clinic.   BELOW ARE SYMPTOMS THAT SHOULD BE REPORTED IMMEDIATELY:  *FEVER GREATER THAN 100.5 F  *CHILLS WITH OR WITHOUT FEVER  NAUSEA AND VOMITING THAT IS NOT CONTROLLED WITH YOUR NAUSEA MEDICATION  *UNUSUAL SHORTNESS OF BREATH  *UNUSUAL BRUISING OR BLEEDING  TENDERNESS IN MOUTH AND THROAT WITH OR WITHOUT PRESENCE OF ULCERS  *URINARY PROBLEMS  *BOWEL PROBLEMS  UNUSUAL RASH Items with * indicate a potential emergency and should be followed up as soon as possible.  Feel free to call the clinic you have any questions or concerns. The clinic phone number is [phone].  Please show the [place] at check-in to the Emergency Department and triage nurse.

## 2015-03-28 ENCOUNTER — Ambulatory Visit (HOSPITAL_COMMUNITY)
Admission: RE | Admit: 2015-03-28 | Discharge: 2015-03-28 | Disposition: A | Discharge status: Home / Self Care | Payer: PPO | Source: Ambulatory Visit | Attending: Nurse Practitioner | Admitting: Nurse Practitioner

## 2015-03-28 ENCOUNTER — Telehealth: Payer: Self-pay

## 2015-03-28 ENCOUNTER — Ambulatory Visit (HOSPITAL_BASED_OUTPATIENT_CLINIC_OR_DEPARTMENT_OTHER): Payer: PPO

## 2015-03-28 VITALS — BP 98/76 | HR 72 | Temp 98.0°F | Resp 18

## 2015-03-28 DIAGNOSIS — R188 Other ascites: Secondary | ICD-10-CM | POA: Insufficient documentation

## 2015-03-28 DIAGNOSIS — C189 Malignant neoplasm of colon, unspecified: Secondary | ICD-10-CM | POA: Diagnosis present

## 2015-03-28 DIAGNOSIS — C188 Malignant neoplasm of overlapping sites of colon: Secondary | ICD-10-CM | POA: Diagnosis not present

## 2015-03-28 DIAGNOSIS — C787 Secondary malignant neoplasm of liver and intrahepatic bile duct: Secondary | ICD-10-CM

## 2015-03-28 DIAGNOSIS — R140 Abdominal distension (gaseous): Secondary | ICD-10-CM | POA: Diagnosis not present

## 2015-03-28 DIAGNOSIS — D649 Anemia, unspecified: Secondary | ICD-10-CM

## 2015-03-28 MED ORDER — HEPARIN SOD (PORK) LOCK FLUSH 100 UNIT/ML IV SOLN
500.0000 [IU] | Freq: Once | INTRAVENOUS | Status: AC | PRN
  Administered 2015-03-28: 500 [IU]
  Filled 2015-03-28: qty 5

## 2015-03-28 MED ORDER — SODIUM CHLORIDE 0.9 % IJ SOLN
10.0000 mL | INTRAMUSCULAR | Status: DC | PRN
  Administered 2015-03-28: 10 mL
  Filled 2015-03-28: qty 10

## 2015-03-28 NOTE — Telephone Encounter (Signed)
Daughter called asking to speak with Dr Benay Spice to get an update on her mother. Emily Lawson was hearing things about abnormal labwork and paracentesis. Pt is forgetful and cannot explain to daughter. Daughter was expecting a call sooner this week. Use her cell [phone]

## 2015-03-28 NOTE — Procedures (Signed)
US guided diagnostic/therapeutic paracentesis performed yielding 3.6 L yellow fluid. A portion of the fluid was submitted to the laboratory for preordered studies. No immediate complications.

## 2015-04-04 ENCOUNTER — Telehealth: Payer: Self-pay | Admitting: *Deleted

## 2015-04-04 NOTE — Telephone Encounter (Signed)
Message from pt's daughter requesting test results. Cytology on paracentesis fluid was negative, per Dr. Benay Spice. Emily Lawson voiced understanding.

## 2015-04-13 ENCOUNTER — Other Ambulatory Visit: Payer: Self-pay | Admitting: Oncology

## 2015-04-16 ENCOUNTER — Ambulatory Visit (HOSPITAL_BASED_OUTPATIENT_CLINIC_OR_DEPARTMENT_OTHER): Payer: PPO | Admitting: Oncology

## 2015-04-16 ENCOUNTER — Other Ambulatory Visit (HOSPITAL_BASED_OUTPATIENT_CLINIC_OR_DEPARTMENT_OTHER): Payer: PPO

## 2015-04-16 ENCOUNTER — Ambulatory Visit (HOSPITAL_BASED_OUTPATIENT_CLINIC_OR_DEPARTMENT_OTHER): Payer: PPO

## 2015-04-16 ENCOUNTER — Telehealth: Payer: Self-pay | Admitting: Oncology

## 2015-04-16 VITALS — BP 133/60 | HR 87 | Temp 97.5°F | Resp 18 | Ht 61.0 in | Wt 178.3 lb

## 2015-04-16 DIAGNOSIS — C787 Secondary malignant neoplasm of liver and intrahepatic bile duct: Secondary | ICD-10-CM | POA: Diagnosis not present

## 2015-04-16 DIAGNOSIS — C189 Malignant neoplasm of colon, unspecified: Secondary | ICD-10-CM

## 2015-04-16 DIAGNOSIS — R188 Other ascites: Secondary | ICD-10-CM

## 2015-04-16 DIAGNOSIS — C188 Malignant neoplasm of overlapping sites of colon: Secondary | ICD-10-CM

## 2015-04-16 DIAGNOSIS — R600 Localized edema: Secondary | ICD-10-CM

## 2015-04-16 DIAGNOSIS — D649 Anemia, unspecified: Secondary | ICD-10-CM

## 2015-04-16 DIAGNOSIS — Z5111 Encounter for antineoplastic chemotherapy: Secondary | ICD-10-CM

## 2015-04-16 DIAGNOSIS — Z5112 Encounter for antineoplastic immunotherapy: Secondary | ICD-10-CM | POA: Diagnosis not present

## 2015-04-16 LAB — CBC WITH DIFFERENTIAL/PLATELET
BASO%: 0.6 % (ref 0.0–2.0)
Basophils Absolute: 0 10*3/uL (ref 0.0–0.1)
EOS%: 5.1 % (ref 0.0–7.0)
Eosinophils Absolute: 0.3 10*3/uL (ref 0.0–0.5)
HEMATOCRIT: 25.4 % — AB (ref 34.8–46.6)
HGB: 7.8 g/dL — ABNORMAL LOW (ref 11.6–15.9)
LYMPH%: 30.3 % (ref 14.0–49.7)
MCH: 30.2 pg (ref 25.1–34.0)
MCHC: 30.7 g/dL — AB (ref 31.5–36.0)
MCV: 98.4 fL (ref 79.5–101.0)
MONO#: 0.6 10*3/uL (ref 0.1–0.9)
MONO%: 9.7 % (ref 0.0–14.0)
NEUT#: 3.4 10*3/uL (ref 1.5–6.5)
NEUT%: 54.3 % (ref 38.4–76.8)
PLATELETS: 264 10*3/uL (ref 145–400)
RBC: 2.58 10*6/uL — ABNORMAL LOW (ref 3.70–5.45)
RDW: 20.9 % — ABNORMAL HIGH (ref 11.2–14.5)
WBC: 6.3 10*3/uL (ref 3.9–10.3)
lymph#: 1.9 10*3/uL (ref 0.9–3.3)

## 2015-04-16 LAB — COMPREHENSIVE METABOLIC PANEL (CC13)
ALBUMIN: 2.3 g/dL — AB (ref 3.5–5.0)
ALT: 15 U/L (ref 0–55)
AST: 29 U/L (ref 5–34)
Alkaline Phosphatase: 243 U/L — ABNORMAL HIGH (ref 40–150)
Anion Gap: 15 mEq/L — ABNORMAL HIGH (ref 3–11)
BUN: 23.2 mg/dL (ref 7.0–26.0)
CO2: 23 mEq/L (ref 22–29)
Calcium: 9.1 mg/dL (ref 8.4–10.4)
Chloride: 104 mEq/L (ref 98–109)
Creatinine: 1.4 mg/dL — ABNORMAL HIGH (ref 0.6–1.1)
EGFR: 37 mL/min/{1.73_m2} — ABNORMAL LOW (ref >90)
Glucose: 113 mg/dl (ref 70–140)
POTASSIUM: 4.3 meq/L (ref 3.5–5.1)
Sodium: 142 mEq/L (ref 136–145)
Total Bilirubin: 1.22 mg/dL — ABNORMAL HIGH (ref 0.20–1.20)
Total Protein: 6.1 g/dL — ABNORMAL LOW (ref 6.4–8.3)

## 2015-04-16 LAB — MAGNESIUM (CC13): MAGNESIUM: 1.3 mg/dL — AB (ref 1.5–2.5)

## 2015-04-16 LAB — CEA: CEA: 12.1 ng/mL — AB (ref 0.0–5.0)

## 2015-04-16 MED ORDER — SODIUM CHLORIDE 0.9 % IV SOLN
1500.0000 mg/m2 | INTRAVENOUS | Status: AC
  Administered 2015-04-16: 2800 mg via INTRAVENOUS
  Filled 2015-04-16: qty 56

## 2015-04-16 MED ORDER — PROCHLORPERAZINE MALEATE 10 MG PO TABS
ORAL_TABLET | ORAL | Status: AC
  Filled 2015-04-16: qty 1

## 2015-04-16 MED ORDER — FLUOROURACIL CHEMO INJECTION 500 MG/10ML
250.0000 mg/m2 | Freq: Once | INTRAVENOUS | Status: AC
  Administered 2015-04-16: 450 mg via INTRAVENOUS
  Filled 2015-04-16: qty 9

## 2015-04-16 MED ORDER — PROCHLORPERAZINE MALEATE 10 MG PO TABS
10.0000 mg | ORAL_TABLET | Freq: Once | ORAL | Status: AC
  Administered 2015-04-16: 10 mg via ORAL

## 2015-04-16 MED ORDER — LEUCOVORIN CALCIUM INJECTION 350 MG
250.0000 mg/m2 | Freq: Once | INTRAVENOUS | Status: AC
  Administered 2015-04-16: 470 mg via INTRAVENOUS
  Filled 2015-04-16: qty 23.5

## 2015-04-16 MED ORDER — SODIUM CHLORIDE 0.9 % IV SOLN
6.0000 mg/kg | Freq: Once | INTRAVENOUS | Status: AC
  Administered 2015-04-16: 500 mg via INTRAVENOUS
  Filled 2015-04-16: qty 25

## 2015-04-16 MED ORDER — SODIUM CHLORIDE 0.9 % IV SOLN
Freq: Once | INTRAVENOUS | Status: AC
  Administered 2015-04-16: 11:00:00 via INTRAVENOUS

## 2015-04-16 NOTE — Progress Notes (Unsigned)
Ok to treat with CMET/CBC/Magnesium per MD Benay Spice

## 2015-04-16 NOTE — Progress Notes (Signed)
[facility] OFFICE PROGRESS NOTE   Diagnosis: Colon cancer  INTERVAL HISTORY:   Emily Lawson returns as scheduled. She reports feeling well. She completed another cycle of 5-FU/panitumumab on 03/26/2015. She underwent a therapeutic/diagnostic paracentesis 03/28/2015. 3.6 L of fluid were removed. She reports feeling better after the paracentesis. The cytology revealed reactive mesothelial cells.  Objective:  Vital signs in last 24 hours:  Blood pressure 133/60, pulse 87, temperature 97.5 F (36.4 C), temperature source Oral, resp. rate 18, height _0  (1.549 m), weight 178 lb 4.8 oz (80.876 kg), SpO2 100 %.    HEENT: No thrush or ulcers Resp: Lungs clear bilaterally Cardio: Regular rate and rhythm GI: No hepatomegaly, mildly distended with ascites, nontender Vascular: Trace to 1+ pitting edema at the low leg bilaterally.  Skin: Mild acne type rash over the face   Portacath/PICC-without erythema  Lab Results:  Lab Results  Component Value Date   WBC 6.3 04/16/2015   HGB 7.8* 04/16/2015   HCT 25.4* 04/16/2015   MCV 98.4 04/16/2015   PLT 264 04/16/2015   NEUTROABS 3.4 04/16/2015      Lab Results  Component Value Date   CEA 9.3* 03/26/2015     Medications: I have reviewed the patient's current medications.  Assessment/Plan: 1. Metastatic colon cancer presenting with synchronous colon primaries in September, 2008, status post a hemicolectomy and liver biopsy 08/03/2007. A CT on 04/20/2010 revealed no evidence metastatic disease. She was maintained on 5-FU/leucovorin per the FOLFOX regimen beginning in April, 2009. Avasta was discontinued from the chemotherapy regimen beginning in November, 2009 due to nephrotic range proteinuria. A CT of the abdomen and pelvis 02/05/2011 revealed evidence of three small liver lesions. An MRI of the abdomen on 03/02/2011 confirmed 3 liver lesions and no additional evidence of metastatic disease. K-RAS testing on the transverse  colon and left colon tumors revealed a wild type genotype. Foundation 1 testing revealed no K-ras, NRAS, or BRAF mutation. 1A. Staging PET scan 03/21/2011 confirmed 3 hypermetabolic liver lesions and no additional evidence of metastatic disease.  1B. Surgical consultation by Dr. Barry Dienes was obtained and Emily Lawson decided against an attempt at surgical resection/radiofrequency ablation.  1C. Salvage therapy with FOLFOX was initiated 04/13/2011.  1D. A restaging CT 06/28/2011 revealed a significant improvement in the hepatic metastasis.  1E. She completed eight cycles of FOLFOX chemotherapy. Treatment was switched to 5FU-leucovorin as per the FOLFOX regimen beginning with cycle #9 due to an allergic reaction (skin rash) following cycle #8.  83F. Restaging CT 10/25/2011 revealed 2 more conspicuous liver metastases .  1G. Restaging PET scan 12/24/2011 revealed an increase in the size and metabolic activity of 3 liver metastases  1H. She began FOLFIRI chemotherapy on 01/19/2012.  1 I. Restaging CT of the abdomen 05/08/2012 revealed a mixed response with enlargement of 2 liver lesions and a decrease in the size of a third lesion, no new lesions.  1J. FOLFIRI chemotherapy continued with the addition of Avastin beginning 05/09/2012.  1K. restaging CT 07/28/2012 with no new liver lesions and a decreased size of the previously noted 3 liver lesions.  1L. She was last treated with FOLFIRI/Avastin on 09/12/2012.  9M. She completed stereotactic radiotherapy to 3 liver metastases 10/10/2012 through 10/20/2012.  1N. Follow-up MRI of the abdomen 11/24/2012 showed improvement in the 3 hepatic metastases and no progressive disease.  1O. Restaging MRI 04/09/2013 showed improvement in the 3 treated hepatic lesions and 2 new lesions. No evidence of metastatic disease outside of liver.  1P. Initiation of Xeloda/Avastin 05/30/2013. Cycle 2 initiated on 06/20/2013 (Avastin placed on hold)-Xeloda discontinued  after 10 days secondary to hand/foot syndrome. Cycle 3 Xeloda question initiated on 07/28/2013 (she is unable to recall when she started or stopped cycle 3). Cycle 4 08/20/2013. Cycle 5 09/14/2013 (no Xeloda). Cycle 6 Xeloda/Avastin 10/01/2013. Cycle 7 Xeloda/Avastin 11/02/2013  1Q. restaging MRI of the liver 11/16/2013-3 enhancing right liver lesion, slightly larger. No evidence of metastasis in the left hepatic lobe.  1R. Xeloda/Avastin continued.  1S. restaging PET scan 01/28/2014 showed 2 hypermetabolic lesions in the liver corresponding to the abnormality seen on the recent MRI. The third lesion seen on the recent MRI was not discernible on the PET. No other evidence for hypermetabolic metastases.  Emily Lawson continued.  1U. CT abdomen/pelvis 06/14/2014 with new moderate ascites; large infiltrating lesions again noted within the right hepatic lobe difficult to fully assess on CT. Mildly increased hepatic atrophy with regard to the 2 larger lesions. A smaller lobulated lesion had increased mildly in size. 1V. CT chest/abdomen 09/09/2014 with progression of multifocal hepatic metastases. No evidence of metastatic disease in the chest. 1W. Initiation of 5-fluorouracil/Panitumumab on a 2 week schedule 09/10/2014. 1X. CT 01/20/2015 with likely mild improvement in multifocal hepatic metastases. 1Y. Continuation of 5-FU/Panitumumab on a 3 week schedule   1. Diabetes. 2. History of proteinuria secondary to Avastin versus diabetes. 3. Mucositis and hand foot syndrome, secondary to 5FU improved with a dose reduction of the 5-FU. 4. 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 Left back and left leg pain with an MRI of the lumbar spine confirming lumbar disk disease. She is status post surgery by Dr. Christella Noa 02/14/2008 with resolution of the back and left leg pain. 6. Rosacea. She is followed by dermatology.  7. Depression, maintained on Cymbalta 8. History of  bilateral knee pain secondary to arthritis -- she takes Vicodin as needed. 9. History of anorexia -- weight loss -- ?Related to depression/anxiety or metastatic colon cancer. 10. Allergic reaction to oxaliplatin with FOLFOX on 04/13/2011. She was able to tolerate FOLFOX on 04/20/2011 with steroid premedication and a prolonged oxaliplatin infusion. After cycle #8 she developed a rash upon returning home with pruritus. She took Benadryl with resolution of symptoms. This reaction occurred despite steroid premedication. 11. Poor dentition -- she was evaluated by Dr. Enrique Sack. She underwent tooth extractions 07/04/2013. She now has dentures.  12. Neutropenia following cycle 1 of FOLFIRI. The FOLFIRI was dose reduced beginning with cycle 2. She was neutropenic 03/14/2012 and chemotherapy was held for one week.  13. Nausea and diarrhea during the irinotecan infusion on 03/21/2012. Symptoms resolved with atropine. She received atropine as a premedication with each cycle. 14. Diarrhea following the 05/09/2012 chemotherapy cycle. Likely related to irinotecan. She takes Imodium as needed. 15. History of Diffuse pain 4-5 days after chemotherapy 07/17/2012-likely toxicity from Neulasta. 16. History of a fall with fracture of the right fifth metacarpal. 17. Hand/foot syndrome secondary to Xeloda-the Xeloda was dose reduced beginning with cycle 3 on 07/23/2013 (?). 18. Hypomagnesemia secondary to Panitumumab. 19. Anemia, multifactorial likely secondary to renal insufficiency, chronic disease and chemotherapy. Trial of weekly Procrit initiated 02/12/2015. 03/26/2015 hemoglobin unchanged and Procrit discontinued. 20. Ascites-status post a therapeutic paracentesis 03/28/2015, negative cytology   Disposition:  Her performance status appears improved today. The plan is to continue 5 fluorouracil/panitumumab unless there is clinical evidence of disease progression. I suspect she has underlying liver disease to account  for the lower extremity edema  and ascites. She will continue the diuretic regimen prescribed by Dr. Alyson Ingles.  She will return for an office visit and chemotherapy in 3 weeks. I discussed the case by telephone with her daughter several weeks ago.  Betsy Coder, MD  04/16/2015  10:11 AM

## 2015-04-16 NOTE — Patient Instructions (Signed)
[facility] Discharge Instructions for Patients Receiving Chemotherapy  Today you received the following chemotherapy agents Leucovorin/Vectibix/5-FU.  To help prevent nausea and vomiting after your treatment, we encourage you to take your nausea medication as directed.    If you develop nausea and vomiting that is not controlled by your nausea medication, call the clinic.   BELOW ARE SYMPTOMS THAT SHOULD BE REPORTED IMMEDIATELY:  *FEVER GREATER THAN 100.5 F  *CHILLS WITH OR WITHOUT FEVER  NAUSEA AND VOMITING THAT IS NOT CONTROLLED WITH YOUR NAUSEA MEDICATION  *UNUSUAL SHORTNESS OF BREATH  *UNUSUAL BRUISING OR BLEEDING  TENDERNESS IN MOUTH AND THROAT WITH OR WITHOUT PRESENCE OF ULCERS  *URINARY PROBLEMS  *BOWEL PROBLEMS  UNUSUAL RASH Items with * indicate a potential emergency and should be followed up as soon as possible.  Feel free to call the clinic you have any questions or concerns. The clinic phone number is [phone].  Please show the [facility] at check-in to the Emergency Department and triage nurse.

## 2015-04-16 NOTE — Telephone Encounter (Signed)
Gave and printed appt sched and avs fo rpt for May adn JUNE

## 2015-04-18 ENCOUNTER — Ambulatory Visit (HOSPITAL_BASED_OUTPATIENT_CLINIC_OR_DEPARTMENT_OTHER): Payer: PPO

## 2015-04-18 VITALS — BP 130/50 | HR 76 | Temp 97.7°F | Resp 18

## 2015-04-18 DIAGNOSIS — C787 Secondary malignant neoplasm of liver and intrahepatic bile duct: Secondary | ICD-10-CM | POA: Diagnosis not present

## 2015-04-18 DIAGNOSIS — C188 Malignant neoplasm of overlapping sites of colon: Secondary | ICD-10-CM

## 2015-04-18 DIAGNOSIS — C189 Malignant neoplasm of colon, unspecified: Secondary | ICD-10-CM

## 2015-04-18 MED ORDER — HEPARIN SOD (PORK) LOCK FLUSH 100 UNIT/ML IV SOLN
500.0000 [IU] | Freq: Once | INTRAVENOUS | Status: AC | PRN
  Administered 2015-04-18: 500 [IU]
  Filled 2015-04-18: qty 5

## 2015-04-18 MED ORDER — SODIUM CHLORIDE 0.9 % IJ SOLN
10.0000 mL | INTRAMUSCULAR | Status: DC | PRN
  Administered 2015-04-18: 10 mL
  Filled 2015-04-18: qty 10

## 2015-05-03 ENCOUNTER — Other Ambulatory Visit: Payer: Self-pay | Admitting: Oncology

## 2015-05-07 ENCOUNTER — Ambulatory Visit (HOSPITAL_BASED_OUTPATIENT_CLINIC_OR_DEPARTMENT_OTHER): Payer: PPO

## 2015-05-07 ENCOUNTER — Other Ambulatory Visit (HOSPITAL_BASED_OUTPATIENT_CLINIC_OR_DEPARTMENT_OTHER): Payer: PPO

## 2015-05-07 ENCOUNTER — Telehealth: Payer: Self-pay | Admitting: *Deleted

## 2015-05-07 ENCOUNTER — Ambulatory Visit (HOSPITAL_BASED_OUTPATIENT_CLINIC_OR_DEPARTMENT_OTHER): Payer: PPO | Admitting: Nurse Practitioner

## 2015-05-07 VITALS — BP 135/60 | HR 80 | Temp 98.5°F | Resp 18 | Ht 61.0 in | Wt 186.8 lb

## 2015-05-07 DIAGNOSIS — D649 Anemia, unspecified: Secondary | ICD-10-CM | POA: Diagnosis not present

## 2015-05-07 DIAGNOSIS — C787 Secondary malignant neoplasm of liver and intrahepatic bile duct: Secondary | ICD-10-CM | POA: Diagnosis not present

## 2015-05-07 DIAGNOSIS — C188 Malignant neoplasm of overlapping sites of colon: Secondary | ICD-10-CM

## 2015-05-07 DIAGNOSIS — C189 Malignant neoplasm of colon, unspecified: Secondary | ICD-10-CM

## 2015-05-07 DIAGNOSIS — R188 Other ascites: Secondary | ICD-10-CM

## 2015-05-07 DIAGNOSIS — Z5111 Encounter for antineoplastic chemotherapy: Secondary | ICD-10-CM

## 2015-05-07 DIAGNOSIS — R600 Localized edema: Secondary | ICD-10-CM

## 2015-05-07 LAB — CBC WITH DIFFERENTIAL/PLATELET
BASO%: 0.5 % (ref 0.0–2.0)
Basophils Absolute: 0 10*3/uL (ref 0.0–0.1)
EOS ABS: 0.4 10*3/uL (ref 0.0–0.5)
EOS%: 4.9 % (ref 0.0–7.0)
HCT: 25.3 % — ABNORMAL LOW (ref 34.8–46.6)
HGB: 7.8 g/dL — ABNORMAL LOW (ref 11.6–15.9)
LYMPH#: 2.1 10*3/uL (ref 0.9–3.3)
LYMPH%: 26.3 % (ref 14.0–49.7)
MCH: 30.6 pg (ref 25.1–34.0)
MCHC: 30.8 g/dL — AB (ref 31.5–36.0)
MCV: 99.2 fL (ref 79.5–101.0)
MONO#: 0.6 10*3/uL (ref 0.1–0.9)
MONO%: 7.9 % (ref 0.0–14.0)
NEUT%: 60.4 % (ref 38.4–76.8)
NEUTROS ABS: 4.8 10*3/uL (ref 1.5–6.5)
PLATELETS: 303 10*3/uL (ref 145–400)
RBC: 2.55 10*6/uL — AB (ref 3.70–5.45)
RDW: 21.6 % — ABNORMAL HIGH (ref 11.2–14.5)
WBC: 8 10*3/uL (ref 3.9–10.3)

## 2015-05-07 LAB — COMPREHENSIVE METABOLIC PANEL (CC13)
ALBUMIN: 2.2 g/dL — AB (ref 3.5–5.0)
ALT: 10 U/L (ref 0–55)
AST: 31 U/L (ref 5–34)
Alkaline Phosphatase: 350 U/L — ABNORMAL HIGH (ref 40–150)
Anion Gap: 16 mEq/L — ABNORMAL HIGH (ref 3–11)
BUN: 23.2 mg/dL (ref 7.0–26.0)
CALCIUM: 8.7 mg/dL (ref 8.4–10.4)
CO2: 21 meq/L — AB (ref 22–29)
Chloride: 104 mEq/L (ref 98–109)
Creatinine: 1.5 mg/dL — ABNORMAL HIGH (ref 0.6–1.1)
EGFR: 32 mL/min/{1.73_m2} — ABNORMAL LOW (ref >90)
Glucose: 145 mg/dl — ABNORMAL HIGH (ref 70–140)
Potassium: 4.7 mEq/L (ref 3.5–5.1)
SODIUM: 141 meq/L (ref 136–145)
Total Bilirubin: 1.44 mg/dL — ABNORMAL HIGH (ref 0.20–1.20)
Total Protein: 6.2 g/dL — ABNORMAL LOW (ref 6.4–8.3)

## 2015-05-07 LAB — MAGNESIUM (CC13): Magnesium: 0.9 mg/dl — CL (ref 1.5–2.5)

## 2015-05-07 MED ORDER — FLUOROURACIL CHEMO INJECTION 500 MG/10ML
250.0000 mg/m2 | Freq: Once | INTRAVENOUS | Status: AC
  Administered 2015-05-07: 450 mg via INTRAVENOUS
  Filled 2015-05-07: qty 9

## 2015-05-07 MED ORDER — LEUCOVORIN CALCIUM INJECTION 350 MG
250.0000 mg/m2 | Freq: Once | INTRAVENOUS | Status: AC
  Administered 2015-05-07: 470 mg via INTRAVENOUS
  Filled 2015-05-07: qty 23.5

## 2015-05-07 MED ORDER — PROCHLORPERAZINE MALEATE 10 MG PO TABS
ORAL_TABLET | ORAL | Status: AC
  Filled 2015-05-07: qty 1

## 2015-05-07 MED ORDER — SODIUM CHLORIDE 0.9 % IV SOLN
6.0000 mg/kg | Freq: Once | INTRAVENOUS | Status: AC
  Administered 2015-05-07: 500 mg via INTRAVENOUS
  Filled 2015-05-07: qty 25

## 2015-05-07 MED ORDER — SODIUM CHLORIDE 0.9 % IV SOLN
Freq: Once | INTRAVENOUS | Status: AC
  Administered 2015-05-07: 13:00:00 via INTRAVENOUS

## 2015-05-07 MED ORDER — PROCHLORPERAZINE MALEATE 10 MG PO TABS
10.0000 mg | ORAL_TABLET | Freq: Once | ORAL | Status: AC
  Administered 2015-05-07: 10 mg via ORAL

## 2015-05-07 MED ORDER — SODIUM CHLORIDE 0.9 % IV SOLN
1500.0000 mg/m2 | INTRAVENOUS | Status: DC
  Administered 2015-05-07: 2800 mg via INTRAVENOUS
  Filled 2015-05-07: qty 56

## 2015-05-07 NOTE — Telephone Encounter (Signed)
Per staff message and POF I have scheduled appts. Advised scheduler of appts. JMW  

## 2015-05-07 NOTE — Progress Notes (Signed)
Per Ned Card NP, OK to treat patient with HGB 7.8

## 2015-05-07 NOTE — Patient Instructions (Signed)
[facility] Discharge Instructions for Patients Receiving Chemotherapy  Today you received the following chemotherapy agents Leucovorin/Vectibix/5-FU.  To help prevent nausea and vomiting after your treatment, we encourage you to take your nausea medication as directed.    If you develop nausea and vomiting that is not controlled by your nausea medication, call the clinic.   BELOW ARE SYMPTOMS THAT SHOULD BE REPORTED IMMEDIATELY:  *FEVER GREATER THAN 100.5 F  *CHILLS WITH OR WITHOUT FEVER  NAUSEA AND VOMITING THAT IS NOT CONTROLLED WITH YOUR NAUSEA MEDICATION  *UNUSUAL SHORTNESS OF BREATH  *UNUSUAL BRUISING OR BLEEDING  TENDERNESS IN MOUTH AND THROAT WITH OR WITHOUT PRESENCE OF ULCERS  *URINARY PROBLEMS  *BOWEL PROBLEMS  UNUSUAL RASH Items with * indicate a potential emergency and should be followed up as soon as possible.  Feel free to call the clinic you have any questions or concerns. The clinic phone number is [phone].  Please show the [facility] at check-in to the Emergency Department and triage nurse.

## 2015-05-07 NOTE — Progress Notes (Signed)
[facility] OFFICE PROGRESS NOTE   Diagnosis:  Colon cancer  INTERVAL HISTORY:   [name] returns as scheduled. She completed another cycle of 5-FU/PANITUMUMAB 04/16/2015. She denies nausea/vomiting. No mouth sores. She notes that her stools are somewhat loose. She has one to 2 bowel movements a day. No rash. She continues to note bilateral foot swelling. Over the past 2 weeks she has been "sore" at the right lateral chest/abdomen. The discomfort is improving.  Objective:  Vital signs in last 24 hours:  Blood pressure 135/60, pulse 80, temperature 98.5 F (36.9 C), temperature source Oral, resp. rate 18, height 5' 1"  (1.549 m), weight 186 lb 12.8 oz (84.732 kg), SpO2 100 %.    HEENT: No thrush or ulcers. Resp: Lungs clear bilaterally. Cardio: Regular rate and rhythm. GI: No hepatomegaly. Abdomen is mildly distended with ascites. Vascular: Pitting edema at the lower legs/feet bilaterally.  Skin: No rash. Musculoskeletal: Soft fullness right lower posterolateral chest. No discrete mass.    Lab Results:  Lab Results  Component Value Date   WBC 8.0 05/07/2015   HGB 7.8* 05/07/2015   HCT 25.3* 05/07/2015   MCV 99.2 05/07/2015   PLT 303 05/07/2015   NEUTROABS 4.8 05/07/2015    Imaging:  No results found.  Medications: I have reviewed the patient's current medications.  Assessment/Plan: 1. Metastatic colon cancer presenting with synchronous colon primaries in September, 2008, status post a hemicolectomy and liver biopsy 08/03/2007. A CT on 04/20/2010 revealed no evidence metastatic disease. She was maintained on 5-FU/leucovorin per the FOLFOX regimen beginning in April, 2009. Avasta was discontinued from the chemotherapy regimen beginning in November, 2009 due to nephrotic range proteinuria. A CT of the abdomen and pelvis 02/05/2011 revealed evidence of three small liver lesions. An MRI of the abdomen on 03/02/2011 confirmed 3 liver lesions and no additional  evidence of metastatic disease. K-RAS testing on the transverse colon and left colon tumors revealed a wild type genotype. Foundation 1 testing revealed no K-ras, NRAS, or BRAF mutation. 1A. Staging PET scan 03/21/2011 confirmed 3 hypermetabolic liver lesions and no additional evidence of metastatic disease.  1B. Surgical consultation by Dr. Barry Dienes was obtained and Emily Lawson decided against an attempt at surgical resection/radiofrequency ablation.  1C. Salvage therapy with FOLFOX was initiated 04/13/2011.  1D. A restaging CT 06/28/2011 revealed a significant improvement in the hepatic metastasis.  1E. She completed eight cycles of FOLFOX chemotherapy. Treatment was switched to 5FU-leucovorin as per the FOLFOX regimen beginning with cycle #9 due to an allergic reaction (skin rash) following cycle #8.  57F. Restaging CT 10/25/2011 revealed 2 more conspicuous liver metastases .  1G. Restaging PET scan 12/24/2011 revealed an increase in the size and metabolic activity of 3 liver metastases  1H. She began FOLFIRI chemotherapy on 01/19/2012.  1 I. Restaging CT of the abdomen 05/08/2012 revealed a mixed response with enlargement of 2 liver lesions and a decrease in the size of a third lesion, no new lesions.  1J. FOLFIRI chemotherapy continued with the addition of Avastin beginning 05/09/2012.  1K. restaging CT 07/28/2012 with no new liver lesions and a decreased size of the previously noted 3 liver lesions.  1L. She was last treated with FOLFIRI/Avastin on 09/12/2012.  58M. She completed stereotactic radiotherapy to 3 liver metastases 10/10/2012 through 10/20/2012.  1N. Follow-up MRI of the abdomen 11/24/2012 showed improvement in the 3 hepatic metastases and no progressive disease.  1O. Restaging MRI 04/09/2013 showed improvement in the 3 treated hepatic lesions and  2 new lesions. No evidence of metastatic disease outside of liver.  1P. Initiation of Xeloda/Avastin 05/30/2013. Cycle 2  initiated on 06/20/2013 (Avastin placed on hold)-Xeloda discontinued after 10 days secondary to hand/foot syndrome. Cycle 3 Xeloda question initiated on 07/28/2013 (she is unable to recall when she started or stopped cycle 3). Cycle 4 08/20/2013. Cycle 5 09/14/2013 (no Xeloda). Cycle 6 Xeloda/Avastin 10/01/2013. Cycle 7 Xeloda/Avastin 11/02/2013  1Q. restaging MRI of the liver 11/16/2013-3 enhancing right liver lesion, slightly larger. No evidence of metastasis in the left hepatic lobe.  1R. Xeloda/Avastin continued.  1S. restaging PET scan 01/28/2014 showed 2 hypermetabolic lesions in the liver corresponding to the abnormality seen on the recent MRI. The third lesion seen on the recent MRI was not discernible on the PET. No other evidence for hypermetabolic metastases.  Emily Lawson continued.  1U. CT abdomen/pelvis 06/14/2014 with new moderate ascites; large infiltrating lesions again noted within the right hepatic lobe difficult to fully assess on CT. Mildly increased hepatic atrophy with regard to the 2 larger lesions. A smaller lobulated lesion had increased mildly in size. 1V. CT chest/abdomen 09/09/2014 with progression of multifocal hepatic metastases. No evidence of metastatic disease in the chest. 1W. Initiation of 5-fluorouracil/Panitumumab on a 2 week schedule 09/10/2014. 1X. CT 01/20/2015 with likely mild improvement in multifocal hepatic metastases. 1Y. Continuation of 5-FU/Panitumumab on a 3 week schedule   1. Diabetes. 2. History of proteinuria secondary to Avastin versus diabetes. 3. Mucositis and hand foot syndrome, secondary to 5FU improved with a dose reduction of the 5-FU. 4. 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 Left back and left leg pain with an MRI of the lumbar spine confirming lumbar disk disease. She is status post surgery by Dr. Christella Noa 02/14/2008 with resolution of the back and left leg pain. 6. Rosacea. She is followed by  dermatology.  7. Depression, maintained on Cymbalta 8. History of bilateral knee pain secondary to arthritis -- she takes Vicodin as needed. 9. History of anorexia -- weight loss -- ?Related to depression/anxiety or metastatic colon cancer. 10. Allergic reaction to oxaliplatin with FOLFOX on 04/13/2011. She was able to tolerate FOLFOX on 04/20/2011 with steroid premedication and a prolonged oxaliplatin infusion. After cycle #8 she developed a rash upon returning home with pruritus. She took Benadryl with resolution of symptoms. This reaction occurred despite steroid premedication. 11. Poor dentition -- she was evaluated by Dr. Enrique Sack. She underwent tooth extractions 07/04/2013. She now has dentures.  12. Neutropenia following cycle 1 of FOLFIRI. The FOLFIRI was dose reduced beginning with cycle 2. She was neutropenic 03/14/2012 and chemotherapy was held for one week.  13. Nausea and diarrhea during the irinotecan infusion on 03/21/2012. Symptoms resolved with atropine. She received atropine as a premedication with each cycle. 14. Diarrhea following the 05/09/2012 chemotherapy cycle. Likely related to irinotecan. She takes Imodium as needed. 15. History of Diffuse pain 4-5 days after chemotherapy 07/17/2012-likely toxicity from Neulasta. 16. History of a fall with fracture of the right fifth metacarpal. 17. Hand/foot syndrome secondary to Xeloda-the Xeloda was dose reduced beginning with cycle 3 on 07/23/2013 (?). 18. Hypomagnesemia secondary to Panitumumab. 19. Anemia, multifactorial likely secondary to renal insufficiency, chronic disease and chemotherapy. Trial of weekly Procrit initiated 02/12/2015. 03/26/2015 hemoglobin unchanged and Procrit discontinued. 20. Ascites-status post a therapeutic paracentesis 03/28/2015, negative cytology     Disposition: Ms. Quarry appears unchanged. Plan to continue every 3 week 5-FU/PANITUMUMAB. She will return for a follow-up visit in 3  weeks.  Ned Card ANP/GNP-BC   05/07/2015  12:09 PM

## 2015-05-09 ENCOUNTER — Ambulatory Visit (HOSPITAL_BASED_OUTPATIENT_CLINIC_OR_DEPARTMENT_OTHER): Payer: PPO

## 2015-05-09 VITALS — BP 143/51 | HR 74 | Temp 97.9°F | Resp 18

## 2015-05-09 DIAGNOSIS — C189 Malignant neoplasm of colon, unspecified: Secondary | ICD-10-CM

## 2015-05-09 DIAGNOSIS — C188 Malignant neoplasm of overlapping sites of colon: Secondary | ICD-10-CM | POA: Diagnosis not present

## 2015-05-09 DIAGNOSIS — C787 Secondary malignant neoplasm of liver and intrahepatic bile duct: Secondary | ICD-10-CM

## 2015-05-09 MED ORDER — SODIUM CHLORIDE 0.9 % IJ SOLN
10.0000 mL | INTRAMUSCULAR | Status: DC | PRN
  Administered 2015-05-09: 10 mL
  Filled 2015-05-09: qty 10

## 2015-05-09 MED ORDER — HEPARIN SOD (PORK) LOCK FLUSH 100 UNIT/ML IV SOLN
500.0000 [IU] | Freq: Once | INTRAVENOUS | Status: AC | PRN
  Administered 2015-05-09: 500 [IU]
  Filled 2015-05-09: qty 5

## 2015-05-09 NOTE — Patient Instructions (Signed)

## 2015-05-25 ENCOUNTER — Other Ambulatory Visit: Payer: Self-pay | Admitting: Oncology

## 2015-05-28 ENCOUNTER — Ambulatory Visit (HOSPITAL_BASED_OUTPATIENT_CLINIC_OR_DEPARTMENT_OTHER): Payer: PPO

## 2015-05-28 ENCOUNTER — Ambulatory Visit (HOSPITAL_BASED_OUTPATIENT_CLINIC_OR_DEPARTMENT_OTHER): Payer: PPO | Admitting: Oncology

## 2015-05-28 ENCOUNTER — Other Ambulatory Visit (HOSPITAL_BASED_OUTPATIENT_CLINIC_OR_DEPARTMENT_OTHER): Payer: PPO

## 2015-05-28 VITALS — BP 140/60 | HR 102 | Temp 98.5°F | Resp 17 | Ht 61.0 in | Wt 178.7 lb

## 2015-05-28 DIAGNOSIS — C787 Secondary malignant neoplasm of liver and intrahepatic bile duct: Secondary | ICD-10-CM | POA: Diagnosis not present

## 2015-05-28 DIAGNOSIS — Z5111 Encounter for antineoplastic chemotherapy: Secondary | ICD-10-CM

## 2015-05-28 DIAGNOSIS — C189 Malignant neoplasm of colon, unspecified: Secondary | ICD-10-CM

## 2015-05-28 DIAGNOSIS — D649 Anemia, unspecified: Secondary | ICD-10-CM | POA: Diagnosis not present

## 2015-05-28 DIAGNOSIS — C188 Malignant neoplasm of overlapping sites of colon: Secondary | ICD-10-CM

## 2015-05-28 LAB — COMPREHENSIVE METABOLIC PANEL (CC13)
ALK PHOS: 318 U/L — AB (ref 40–150)
ALT: 12 U/L (ref 0–55)
AST: 32 U/L (ref 5–34)
Albumin: 2.3 g/dL — ABNORMAL LOW (ref 3.5–5.0)
Anion Gap: 14 mEq/L — ABNORMAL HIGH (ref 3–11)
BUN: 19.9 mg/dL (ref 7.0–26.0)
CO2: 23 mEq/L (ref 22–29)
Calcium: 8.9 mg/dL (ref 8.4–10.4)
Chloride: 105 mEq/L (ref 98–109)
Creatinine: 1.4 mg/dL — ABNORMAL HIGH (ref 0.6–1.1)
EGFR: 36 mL/min/{1.73_m2} — ABNORMAL LOW (ref >90)
Glucose: 121 mg/dl (ref 70–140)
POTASSIUM: 4.1 meq/L (ref 3.5–5.1)
Sodium: 142 mEq/L (ref 136–145)
TOTAL PROTEIN: 6.1 g/dL — AB (ref 6.4–8.3)
Total Bilirubin: 1.41 mg/dL — ABNORMAL HIGH (ref 0.20–1.20)

## 2015-05-28 LAB — CBC WITH DIFFERENTIAL/PLATELET
BASO%: 1.4 % (ref 0.0–2.0)
Basophils Absolute: 0.1 10*3/uL (ref 0.0–0.1)
EOS ABS: 0.4 10*3/uL (ref 0.0–0.5)
EOS%: 6.5 % (ref 0.0–7.0)
HEMATOCRIT: 24.3 % — AB (ref 34.8–46.6)
HGB: 7.8 g/dL — ABNORMAL LOW (ref 11.6–15.9)
LYMPH#: 2.1 10*3/uL (ref 0.9–3.3)
LYMPH%: 31.7 % (ref 14.0–49.7)
MCH: 31.6 pg (ref 25.1–34.0)
MCHC: 32.1 g/dL (ref 31.5–36.0)
MCV: 98.5 fL (ref 79.5–101.0)
MONO#: 0.5 10*3/uL (ref 0.1–0.9)
MONO%: 8.3 % (ref 0.0–14.0)
NEUT%: 52.1 % (ref 38.4–76.8)
NEUTROS ABS: 3.4 10*3/uL (ref 1.5–6.5)
PLATELETS: 330 10*3/uL (ref 145–400)
RBC: 2.46 10*6/uL — ABNORMAL LOW (ref 3.70–5.45)
RDW: 23.5 % — ABNORMAL HIGH (ref 11.2–14.5)
WBC: 6.6 10*3/uL (ref 3.9–10.3)

## 2015-05-28 LAB — CEA: CEA: 17.6 ng/mL — AB (ref 0.0–5.0)

## 2015-05-28 LAB — MAGNESIUM (CC13): MAGNESIUM: 1 mg/dL — AB (ref 1.5–2.5)

## 2015-05-28 MED ORDER — SODIUM CHLORIDE 0.9 % IJ SOLN
10.0000 mL | INTRAMUSCULAR | Status: DC | PRN
  Filled 2015-05-28: qty 10

## 2015-05-28 MED ORDER — FLUOROURACIL CHEMO INJECTION 500 MG/10ML
250.0000 mg/m2 | Freq: Once | INTRAVENOUS | Status: AC
  Administered 2015-05-28: 450 mg via INTRAVENOUS
  Filled 2015-05-28: qty 9

## 2015-05-28 MED ORDER — LEUCOVORIN CALCIUM INJECTION 350 MG
250.0000 mg/m2 | Freq: Once | INTRAVENOUS | Status: AC
  Administered 2015-05-28: 470 mg via INTRAVENOUS
  Filled 2015-05-28: qty 23.5

## 2015-05-28 MED ORDER — SODIUM CHLORIDE 0.9 % IV SOLN
Freq: Once | INTRAVENOUS | Status: AC
  Administered 2015-05-28: 12:00:00 via INTRAVENOUS

## 2015-05-28 MED ORDER — SODIUM CHLORIDE 0.9 % IV SOLN
1500.0000 mg/m2 | INTRAVENOUS | Status: DC
  Administered 2015-05-28: 2800 mg via INTRAVENOUS
  Filled 2015-05-28: qty 56

## 2015-05-28 MED ORDER — PROCHLORPERAZINE MALEATE 10 MG PO TABS
10.0000 mg | ORAL_TABLET | Freq: Once | ORAL | Status: AC
  Administered 2015-05-28: 10 mg via ORAL

## 2015-05-28 MED ORDER — SODIUM CHLORIDE 0.9 % IV SOLN
Freq: Once | INTRAVENOUS | Status: AC
  Administered 2015-05-28: 15:00:00 via INTRAVENOUS
  Filled 2015-05-28: qty 250

## 2015-05-28 MED ORDER — SODIUM CHLORIDE 0.9 % IV SOLN
6.0000 mg/kg | Freq: Once | INTRAVENOUS | Status: AC
  Administered 2015-05-28: 500 mg via INTRAVENOUS
  Filled 2015-05-28: qty 25

## 2015-05-28 MED ORDER — HEPARIN SOD (PORK) LOCK FLUSH 100 UNIT/ML IV SOLN
500.0000 [IU] | Freq: Once | INTRAVENOUS | Status: DC | PRN
  Filled 2015-05-28: qty 5

## 2015-05-28 MED ORDER — PROCHLORPERAZINE MALEATE 10 MG PO TABS
ORAL_TABLET | ORAL | Status: AC
  Filled 2015-05-28: qty 1

## 2015-05-28 NOTE — Progress Notes (Signed)
Per Dr. Benay Spice, okay to treat today with hgb 7.8.

## 2015-05-28 NOTE — Progress Notes (Signed)
Emily Lawson   Diagnosis: Colon cancer  INTERVAL HISTORY:   Emily Lawson returns as scheduled. She continues every 3 week 5 fluorouracil and panitumumab. She reports occasional diarrhea, but not on a daily basis. No significant skin rash. She complains of hair loss.  Objective:  Vital signs in last 24 hours:  Blood pressure 140/60, pulse 102, temperature 98.5 F (36.9 C), temperature source Oral, resp. rate 17, height 5' 1"  (1.549 m), weight 178 lb 11.2 oz (81.058 kg), SpO2 100 %.    HEENT: No thrush or ulcers Resp: Lungs clear bilaterally Cardio: Regular rate and rhythm GI: No hepatomegaly, nontender Vascular: No leg edema  Skin: Small ecchymoses at the distal forearms bilaterally, no rash   Portacath/PICC-without erythema  Lab Results:  Lab Results  Component Value Date   WBC 6.6 05/28/2015   HGB 7.8* 05/28/2015   HCT 24.3* 05/28/2015   MCV 98.5 05/28/2015   PLT 330 05/28/2015   NEUTROABS 3.4 05/28/2015      Lab Results  Component Value Date   CEA 17.6* 05/28/2015     Medications: I have reviewed the patient's current medications.  Assessment/Plan: 1. Metastatic colon cancer presenting with synchronous colon primaries in September, 2008, status post a hemicolectomy and liver biopsy 08/03/2007. A CT on 04/20/2010 revealed no evidence metastatic disease. She was maintained on 5-FU/leucovorin per the FOLFOX regimen beginning in April, 2009. Avasta was discontinued from the chemotherapy regimen beginning in November, 2009 due to nephrotic range proteinuria. A CT of the abdomen and pelvis 02/05/2011 revealed evidence of three small liver lesions. An MRI of the abdomen on 03/02/2011 confirmed 3 liver lesions and no additional evidence of metastatic disease. K-RAS testing on the transverse colon and left colon tumors revealed a wild type genotype. Foundation 1 testing revealed no K-ras, NRAS, or BRAF mutation. 1A. Staging PET scan  03/21/2011 confirmed 3 hypermetabolic liver lesions and no additional evidence of metastatic disease.  1B. Surgical consultation by Dr. Barry Dienes was obtained and Emily Lawson decided against an attempt at surgical resection/radiofrequency ablation.  1C. Salvage therapy with FOLFOX was initiated 04/13/2011.  1D. A restaging CT 06/28/2011 revealed a significant improvement in the hepatic metastasis.  1E. She completed eight cycles of FOLFOX chemotherapy. Treatment was switched to 5FU-leucovorin as per the FOLFOX regimen beginning with cycle #9 due to an allergic reaction (skin rash) following cycle #8.  15F. Restaging CT 10/25/2011 revealed 2 more conspicuous liver metastases .  1G. Restaging PET scan 12/24/2011 revealed an increase in the size and metabolic activity of 3 liver metastases  1H. She began FOLFIRI chemotherapy on 01/19/2012.  1 I. Restaging CT of the abdomen 05/08/2012 revealed a mixed response with enlargement of 2 liver lesions and a decrease in the size of a third lesion, no new lesions.  1J. FOLFIRI chemotherapy continued with the addition of Avastin beginning 05/09/2012.  1K. restaging CT 07/28/2012 with no new liver lesions and a decreased size of the previously noted 3 liver lesions.  1L. She was last treated with FOLFIRI/Avastin on 09/12/2012.  37M. She completed stereotactic radiotherapy to 3 liver metastases 10/10/2012 through 10/20/2012.  1N. Follow-up MRI of the abdomen 11/24/2012 showed improvement in the 3 hepatic metastases and no progressive disease.  1O. Restaging MRI 04/09/2013 showed improvement in the 3 treated hepatic lesions and 2 new lesions. No evidence of metastatic disease outside of liver.  1P. Initiation of Xeloda/Avastin 05/30/2013. Cycle 2 initiated on 06/20/2013 (Avastin placed on hold)-Xeloda discontinued after 10  days secondary to hand/foot syndrome. Cycle 3 Xeloda question initiated on 07/28/2013 (she is unable to recall when she started or stopped  cycle 3). Cycle 4 08/20/2013. Cycle 5 09/14/2013 (no Xeloda). Cycle 6 Xeloda/Avastin 10/01/2013. Cycle 7 Xeloda/Avastin 11/02/2013  1Q. restaging MRI of the liver 11/16/2013-3 enhancing right liver lesion, slightly larger. No evidence of metastasis in the left hepatic lobe.  1R. Xeloda/Avastin continued.  1S. restaging PET scan 01/28/2014 showed 2 hypermetabolic lesions in the liver corresponding to the abnormality seen on the recent MRI. The third lesion seen on the recent MRI was not discernible on the PET. No other evidence for hypermetabolic metastases.  Emily Lawson continued.  1U. CT abdomen/pelvis 06/14/2014 with new moderate ascites; large infiltrating lesions again noted within the right hepatic lobe difficult to fully assess on CT. Mildly increased hepatic atrophy with regard to the 2 larger lesions. A smaller lobulated lesion had increased mildly in size. 1V. CT chest/abdomen 09/09/2014 with progression of multifocal hepatic metastases. No evidence of metastatic disease in the chest. 1W. Initiation of 5-fluorouracil/Panitumumab on a 2 week schedule 09/10/2014. 1X. CT 01/20/2015 with likely mild improvement in multifocal hepatic metastases. 1Y. Continuation of 5-FU/Panitumumab on a 3 week schedule   1. Diabetes. 2. History of proteinuria secondary to Avastin versus diabetes. 3. Mucositis and hand foot syndrome, secondary to 5FU improved with a dose reduction of the 5-FU. 4. 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 Left back and left leg pain with an MRI of the lumbar spine confirming lumbar disk disease. She is status post surgery by Dr. Christella Noa 02/14/2008 with resolution of the back and left leg pain. 6. Rosacea. She is followed by dermatology.  7. Depression, maintained on Cymbalta 8. History of bilateral knee pain secondary to arthritis -- she takes Vicodin as needed. 9. History of anorexia -- weight loss -- ?Related to depression/anxiety or  metastatic colon cancer. 10. Allergic reaction to oxaliplatin with FOLFOX on 04/13/2011. She was able to tolerate FOLFOX on 04/20/2011 with steroid premedication and a prolonged oxaliplatin infusion. After cycle #8 she developed a rash upon returning home with pruritus. She took Benadryl with resolution of symptoms. This reaction occurred despite steroid premedication. 11. Poor dentition -- she was evaluated by Dr. Enrique Sack. She underwent tooth extractions 07/04/2013. She now has dentures.  12. Neutropenia following cycle 1 of FOLFIRI. The FOLFIRI was dose reduced beginning with cycle 2. She was neutropenic 03/14/2012 and chemotherapy was held for one week.  13. Nausea and diarrhea during the irinotecan infusion on 03/21/2012. Symptoms resolved with atropine. She received atropine as a premedication with each cycle. 14. Diarrhea following the 05/09/2012 chemotherapy cycle. Likely related to irinotecan. She takes Imodium as needed. 15. History of Diffuse pain 4-5 days after chemotherapy 07/17/2012-likely toxicity from Neulasta. 16. History of a fall with fracture of the right fifth metacarpal. 17. Hand/foot syndrome secondary to Xeloda-the Xeloda was dose reduced beginning with cycle 3 on 07/23/2013 (?). 18. Hypomagnesemia secondary to Panitumumab. 19. Anemia, multifactorial likely secondary to renal insufficiency, chronic disease and chemotherapy. Trial of weekly Procrit initiated 02/12/2015. 03/26/2015 hemoglobin unchanged and Procrit discontinued. 20. Ascites-status post a therapeutic paracentesis 03/28/2015, negative cytology   Disposition:  Her overall status appears unchanged. The plan is to continue 5 fluorouracil/panitumumab. She will return for an office visit and chemotherapy in 3 weeks. We will schedule a restaging CT evaluation if the CEA is higher in 3 weeks. We will place her name on the list for screening for the MET amplified  trial.  She does not have significant rash from the  panitumumab. She will discontinue minocycline to see if this helps with the diarrhea.  Betsy Coder, MD  05/28/2015  2:22 PM

## 2015-05-28 NOTE — Patient Instructions (Signed)
[facility] Discharge Instructions for Patients Receiving Chemotherapy  Today you received the following chemotherapy agents Vectibix/Leucovorin/Fluoruricil.  To help prevent nausea and vomiting after your treatment, we encourage you to take your nausea medication as directed.   If you develop nausea and vomiting that is not controlled by your nausea medication, call the clinic.   BELOW ARE SYMPTOMS THAT SHOULD BE REPORTED IMMEDIATELY:  *FEVER GREATER THAN 100.5 F  *CHILLS WITH OR WITHOUT FEVER  NAUSEA AND VOMITING THAT IS NOT CONTROLLED WITH YOUR NAUSEA MEDICATION  *UNUSUAL SHORTNESS OF BREATH  *UNUSUAL BRUISING OR BLEEDING  TENDERNESS IN MOUTH AND THROAT WITH OR WITHOUT PRESENCE OF ULCERS  *URINARY PROBLEMS  *BOWEL PROBLEMS  UNUSUAL RASH Items with * indicate a potential emergency and should be followed up as soon as possible.  Feel free to call the clinic you have any questions or concerns. The clinic phone number is [phone].  Please show the [facility] at check-in to the Emergency Department and triage nurse.

## 2015-05-30 ENCOUNTER — Ambulatory Visit (HOSPITAL_BASED_OUTPATIENT_CLINIC_OR_DEPARTMENT_OTHER): Payer: PPO

## 2015-05-30 VITALS — BP 114/88 | HR 83 | Temp 98.4°F

## 2015-05-30 DIAGNOSIS — C189 Malignant neoplasm of colon, unspecified: Secondary | ICD-10-CM

## 2015-05-30 DIAGNOSIS — C188 Malignant neoplasm of overlapping sites of colon: Secondary | ICD-10-CM

## 2015-05-30 DIAGNOSIS — C787 Secondary malignant neoplasm of liver and intrahepatic bile duct: Secondary | ICD-10-CM

## 2015-05-30 MED ORDER — SODIUM CHLORIDE 0.9 % IJ SOLN
10.0000 mL | INTRAMUSCULAR | Status: DC | PRN
  Administered 2015-05-30: 10 mL
  Filled 2015-05-30: qty 10

## 2015-05-30 MED ORDER — HEPARIN SOD (PORK) LOCK FLUSH 100 UNIT/ML IV SOLN
500.0000 [IU] | Freq: Once | INTRAVENOUS | Status: AC | PRN
  Administered 2015-05-30: 500 [IU]
  Filled 2015-05-30: qty 5

## 2015-05-30 NOTE — Patient Instructions (Signed)
Fluorouracil, 5-FU injection What is this medicine? FLUOROURACIL, 5-FU (flure oh YOOR a sil) is a chemotherapy drug. It slows the growth of cancer cells. This medicine is used to treat many types of cancer like breast cancer, colon or rectal cancer, pancreatic cancer, and stomach cancer. This medicine may be used for other purposes; ask your health care provider or pharmacist if you have questions. COMMON BRAND NAME(S): Adrucil What should I tell my health care provider before I take this medicine? They need to know if you have any of these conditions: -blood disorders -dihydropyrimidine dehydrogenase (DPD) deficiency -infection (especially a virus infection such as chickenpox, cold sores, or herpes) -kidney disease -liver disease -malnourished, poor nutrition -recent or ongoing radiation therapy -an unusual or allergic reaction to fluorouracil, other chemotherapy, other medicines, foods, dyes, or preservatives -pregnant or trying to get pregnant -breast-feeding How should I use this medicine? This drug is given as an infusion or injection into a vein. It is administered in a hospital or clinic by a specially trained health care professional. Talk to your pediatrician regarding the use of this medicine in children. Special care may be needed. Overdosage: If you think you have taken too much of this medicine contact a poison control center or emergency room at once. NOTE: This medicine is only for you. Do not share this medicine with others. What if I miss a dose? It is important not to miss your dose. Call your doctor or health care professional if you are unable to keep an appointment. What may interact with this medicine? -allopurinol -cimetidine -dapsone -digoxin -hydroxyurea -leucovorin -levamisole -medicines for seizures like ethotoin, fosphenytoin, phenytoin -medicines to increase blood counts like filgrastim, pegfilgrastim, sargramostim -medicines that treat or prevent blood  clots like warfarin, enoxaparin, and dalteparin -methotrexate -metronidazole -pyrimethamine -some other chemotherapy drugs like busulfan, cisplatin, estramustine, vinblastine -trimethoprim -trimetrexate -vaccines Talk to your doctor or health care professional before taking any of these medicines: -acetaminophen -aspirin -ibuprofen -ketoprofen -naproxen This list may not describe all possible interactions. Give your health care provider a list of all the medicines, herbs, non-prescription drugs, or dietary supplements you use. Also tell them if you smoke, drink alcohol, or use illegal drugs. Some items may interact with your medicine. What should I watch for while using this medicine? Visit your doctor for checks on your progress. This drug may make you feel generally unwell. This is not uncommon, as chemotherapy can affect healthy cells as well as cancer cells. Report any side effects. Continue your course of treatment even though you feel ill unless your doctor tells you to stop. In some cases, you may be given additional medicines to help with side effects. Follow all directions for their use. Call your doctor or health care professional for advice if you get a fever, chills or sore throat, or other symptoms of a cold or flu. Do not treat yourself. This drug decreases your body's ability to fight infections. Try to avoid being around people who are sick. This medicine may increase your risk to bruise or bleed. Call your doctor or health care professional if you notice any unusual bleeding. Be careful brushing and flossing your teeth or using a toothpick because you may get an infection or bleed more easily. If you have any dental work done, tell your dentist you are receiving this medicine. Avoid taking products that contain aspirin, acetaminophen, ibuprofen, naproxen, or ketoprofen unless instructed by your doctor. These medicines may hide a fever. Do not become pregnant while taking this    medicine. Women should inform their doctor if they wish to become pregnant or think they might be pregnant. There is a potential for serious side effects to an unborn child. Talk to your health care professional or pharmacist for more information. Do not breast-feed an infant while taking this medicine. Men should inform their doctor if they wish to father a child. This medicine may lower sperm counts. Do not treat diarrhea with over the counter products. Contact your doctor if you have diarrhea that lasts more than 2 days or if it is severe and watery. This medicine can make you more sensitive to the sun. Keep out of the sun. If you cannot avoid being in the sun, wear protective clothing and use sunscreen. Do not use sun lamps or tanning beds/booths. What side effects may I notice from receiving this medicine? Side effects that you should report to your doctor or health care professional as soon as possible: -allergic reactions like skin rash, itching or hives, swelling of the face, lips, or tongue -low blood counts - this medicine may decrease the number of white blood cells, red blood cells and platelets. You may be at increased risk for infections and bleeding. -signs of infection - fever or chills, cough, sore throat, pain or difficulty passing urine -signs of decreased platelets or bleeding - bruising, pinpoint red spots on the skin, black, tarry stools, blood in the urine -signs of decreased red blood cells - unusually weak or tired, fainting spells, lightheadedness -breathing problems -changes in vision -chest pain -mouth sores -nausea and vomiting -pain, swelling, redness at site where injected -pain, tingling, numbness in the hands or feet -redness, swelling, or sores on hands or feet -stomach pain -unusual bleeding Side effects that usually do not require medical attention (report to your doctor or health care professional if they continue or are bothersome): -changes in finger or  toe nails -diarrhea -dry or itchy skin -hair loss -headache -loss of appetite -sensitivity of eyes to the light -stomach upset -unusually teary eyes This list may not describe all possible side effects. Call your doctor for medical advice about side effects. You may report side effects to FDA at 1-800-FDA-1088. Where should I keep my medicine? This drug is given in a hospital or clinic and will not be stored at home. NOTE: This sheet is a summary. It may not cover all possible information. If you have questions about this medicine, talk to your doctor, pharmacist, or health care provider.  2015, [employer] Standard. (2008-03-20 13:53:16)   

## 2015-06-14 ENCOUNTER — Other Ambulatory Visit: Payer: Self-pay | Admitting: Oncology

## 2015-06-16 ENCOUNTER — Telehealth: Payer: Self-pay | Admitting: *Deleted

## 2015-06-16 NOTE — Telephone Encounter (Signed)
Message from pt requesting to move appointments to 7/19. No availability with providers. She reports she "worked it out" she'll to come in 7/20 as scheduled.

## 2015-06-18 ENCOUNTER — Other Ambulatory Visit (HOSPITAL_BASED_OUTPATIENT_CLINIC_OR_DEPARTMENT_OTHER): Payer: PPO

## 2015-06-18 ENCOUNTER — Telehealth: Payer: Self-pay | Admitting: Oncology

## 2015-06-18 ENCOUNTER — Ambulatory Visit (HOSPITAL_COMMUNITY)
Admission: RE | Admit: 2015-06-18 | Discharge: 2015-06-18 | Disposition: A | Discharge status: Home / Self Care | Payer: PPO | Source: Ambulatory Visit | Attending: Oncology | Admitting: Oncology

## 2015-06-18 ENCOUNTER — Other Ambulatory Visit: Payer: Self-pay | Admitting: *Deleted

## 2015-06-18 ENCOUNTER — Ambulatory Visit (HOSPITAL_BASED_OUTPATIENT_CLINIC_OR_DEPARTMENT_OTHER): Payer: PPO | Admitting: Oncology

## 2015-06-18 ENCOUNTER — Ambulatory Visit (HOSPITAL_BASED_OUTPATIENT_CLINIC_OR_DEPARTMENT_OTHER): Payer: PPO

## 2015-06-18 VITALS — BP 149/58 | HR 76 | Temp 98.4°F | Resp 18 | Ht 61.0 in | Wt 179.5 lb

## 2015-06-18 VITALS — BP 130/62 | HR 74 | Temp 98.4°F | Resp 18

## 2015-06-18 DIAGNOSIS — E119 Type 2 diabetes mellitus without complications: Secondary | ICD-10-CM

## 2015-06-18 DIAGNOSIS — C787 Secondary malignant neoplasm of liver and intrahepatic bile duct: Secondary | ICD-10-CM

## 2015-06-18 DIAGNOSIS — D6489 Other specified anemias: Secondary | ICD-10-CM

## 2015-06-18 DIAGNOSIS — C189 Malignant neoplasm of colon, unspecified: Secondary | ICD-10-CM

## 2015-06-18 DIAGNOSIS — Z5111 Encounter for antineoplastic chemotherapy: Secondary | ICD-10-CM

## 2015-06-18 DIAGNOSIS — Z5112 Encounter for antineoplastic immunotherapy: Secondary | ICD-10-CM | POA: Diagnosis not present

## 2015-06-18 DIAGNOSIS — C188 Malignant neoplasm of overlapping sites of colon: Secondary | ICD-10-CM

## 2015-06-18 DIAGNOSIS — R5383 Other fatigue: Secondary | ICD-10-CM

## 2015-06-18 DIAGNOSIS — D649 Anemia, unspecified: Secondary | ICD-10-CM | POA: Diagnosis not present

## 2015-06-18 LAB — CBC WITH DIFFERENTIAL/PLATELET
BASO%: 1.5 % (ref 0.0–2.0)
Basophils Absolute: 0.1 10*3/uL (ref 0.0–0.1)
EOS ABS: 0.4 10*3/uL (ref 0.0–0.5)
EOS%: 5.1 % (ref 0.0–7.0)
HCT: 24.9 % — ABNORMAL LOW (ref 34.8–46.6)
HEMOGLOBIN: 8 g/dL — AB (ref 11.6–15.9)
LYMPH#: 1.6 10*3/uL (ref 0.9–3.3)
LYMPH%: 21.7 % (ref 14.0–49.7)
MCH: 32.3 pg (ref 25.1–34.0)
MCHC: 32 g/dL (ref 31.5–36.0)
MCV: 101 fL (ref 79.5–101.0)
MONO#: 0.6 10*3/uL (ref 0.1–0.9)
MONO%: 7.8 % (ref 0.0–14.0)
NEUT%: 63.9 % (ref 38.4–76.8)
NEUTROS ABS: 4.7 10*3/uL (ref 1.5–6.5)
PLATELETS: 305 10*3/uL (ref 145–400)
RBC: 2.47 10*6/uL — AB (ref 3.70–5.45)
RDW: 22.6 % — ABNORMAL HIGH (ref 11.2–14.5)
WBC: 7.3 10*3/uL (ref 3.9–10.3)

## 2015-06-18 LAB — COMPREHENSIVE METABOLIC PANEL (CC13)
ALBUMIN: 2.2 g/dL — AB (ref 3.5–5.0)
ALT: 11 U/L (ref 0–55)
AST: 32 U/L (ref 5–34)
Alkaline Phosphatase: 337 U/L — ABNORMAL HIGH (ref 40–150)
Anion Gap: 13 mEq/L — ABNORMAL HIGH (ref 3–11)
BILIRUBIN TOTAL: 1.22 mg/dL — AB (ref 0.20–1.20)
BUN: 24.1 mg/dL (ref 7.0–26.0)
CALCIUM: 8.8 mg/dL (ref 8.4–10.4)
CO2: 22 meq/L (ref 22–29)
CREATININE: 1.7 mg/dL — AB (ref 0.6–1.1)
Chloride: 106 mEq/L (ref 98–109)
EGFR: 28 mL/min/{1.73_m2} — AB (ref >90)
GLUCOSE: 120 mg/dL (ref 70–140)
POTASSIUM: 4 meq/L (ref 3.5–5.1)
SODIUM: 140 meq/L (ref 136–145)
TOTAL PROTEIN: 6.1 g/dL — AB (ref 6.4–8.3)

## 2015-06-18 LAB — MAGNESIUM (CC13): Magnesium: 1 mg/dl — CL (ref 1.5–2.5)

## 2015-06-18 LAB — HOLD TUBE, BLOOD BANK

## 2015-06-18 LAB — PREPARE RBC (CROSSMATCH)

## 2015-06-18 MED ORDER — SODIUM CHLORIDE 0.9 % IV SOLN
Freq: Once | INTRAVENOUS | Status: AC
  Administered 2015-06-18: 12:00:00 via INTRAVENOUS

## 2015-06-18 MED ORDER — FLUOROURACIL CHEMO INJECTION 5 GM/100ML
1500.0000 mg/m2 | INTRAVENOUS | Status: DC
  Administered 2015-06-18: 2800 mg via INTRAVENOUS
  Filled 2015-06-18: qty 56

## 2015-06-18 MED ORDER — FLUOROURACIL CHEMO INJECTION 500 MG/10ML
250.0000 mg/m2 | Freq: Once | INTRAVENOUS | Status: AC
  Administered 2015-06-18: 450 mg via INTRAVENOUS
  Filled 2015-06-18: qty 9

## 2015-06-18 MED ORDER — SODIUM CHLORIDE 0.9 % IJ SOLN
10.0000 mL | INTRAMUSCULAR | Status: DC | PRN
  Filled 2015-06-18: qty 10

## 2015-06-18 MED ORDER — SODIUM CHLORIDE 0.9 % IV SOLN
6.0000 mg/kg | Freq: Once | INTRAVENOUS | Status: AC
  Administered 2015-06-18: 500 mg via INTRAVENOUS
  Filled 2015-06-18: qty 25

## 2015-06-18 MED ORDER — LEUCOVORIN CALCIUM INJECTION 350 MG
250.0000 mg/m2 | Freq: Once | INTRAVENOUS | Status: AC
  Administered 2015-06-18: 470 mg via INTRAVENOUS
  Filled 2015-06-18: qty 23.5

## 2015-06-18 MED ORDER — SODIUM CHLORIDE 0.9 % IV SOLN: 250.0000 mL | Freq: Once | INTRAVENOUS | Status: DC

## 2015-06-18 MED ORDER — PROCHLORPERAZINE MALEATE 10 MG PO TABS
ORAL_TABLET | ORAL | Status: AC
  Filled 2015-06-18: qty 1

## 2015-06-18 MED ORDER — HEPARIN SOD (PORK) LOCK FLUSH 100 UNIT/ML IV SOLN
500.0000 [IU] | Freq: Every day | INTRAVENOUS | Status: DC | PRN
  Filled 2015-06-18: qty 5

## 2015-06-18 MED ORDER — PROCHLORPERAZINE MALEATE 10 MG PO TABS
10.0000 mg | ORAL_TABLET | Freq: Once | ORAL | Status: AC
  Administered 2015-06-18: 10 mg via ORAL

## 2015-06-18 NOTE — Patient Instructions (Signed)
[facility] Discharge Instructions for Patients Receiving Chemotherapy  Today you received the following chemotherapy agents Leucovorin/Vectibix/5-FU.  To help prevent nausea and vomiting after your treatment, we encourage you to take your nausea medication as directed.    If you develop nausea and vomiting that is not controlled by your nausea medication, call the clinic.   BELOW ARE SYMPTOMS THAT SHOULD BE REPORTED IMMEDIATELY:  *FEVER GREATER THAN 100.5 F  *CHILLS WITH OR WITHOUT FEVER  NAUSEA AND VOMITING THAT IS NOT CONTROLLED WITH YOUR NAUSEA MEDICATION  *UNUSUAL SHORTNESS OF BREATH  *UNUSUAL BRUISING OR BLEEDING  TENDERNESS IN MOUTH AND THROAT WITH OR WITHOUT PRESENCE OF ULCERS  *URINARY PROBLEMS  *BOWEL PROBLEMS  UNUSUAL RASH Items with * indicate a potential emergency and should be followed up as soon as possible.  Feel free to call the clinic you have any questions or concerns. The clinic phone number is [phone].  Please show the [facility] at check-in to the Emergency Department and triage nurse.     Blood Transfusion  A blood transfusion replaces your blood or some of its parts. Blood is replaced when you have lost blood because of surgery, an accident, or for severe blood conditions like anemia. You can donate blood to be used on yourself if you have a planned surgery. If you lose blood during that surgery, your own blood can be given back to you. Any blood given to you is checked to make sure it matches your blood type. Your temperature, blood pressure, and heart rate (vital signs) will be checked often.  GET HELP RIGHT AWAY IF:  9. You feel sick to your stomach (nauseous) or throw up (vomit). 10. You have watery poop (diarrhea). 11. You have shortness of breath or trouble breathing. 12. You have blood in your pee (urine) or have dark colored pee. 13. You have chest pain or tightness. 14. Your eyes or skin turn yellow  (jaundice). 15. You have a temperature by mouth above 102 F (38.9 C), not controlled by medicine. 16. You start to shake and have chills. 17. You develop a a red rash (hives) or feel itchy. 18. You develop lightheadedness or feel confused. 19. You develop back, joint, or muscle pain. 20. You do not feel hungry (lost appetite). 21. You feel tired, restless, or nervous. 22. You develop belly (abdominal) cramps. Document Released: 02/11/2009 Document Revised: 02/07/2012 Document Reviewed: 02/11/2009 [facility] Patient Information 2015 [place], [place]. This information is not intended to replace advice given to you by your health care provider. Make sure you discuss any questions you have with your health care provider.

## 2015-06-18 NOTE — Progress Notes (Signed)
Transfuse 1 unit RBC today, proceed with chemo per MD

## 2015-06-18 NOTE — Progress Notes (Signed)
[facility] OFFICE PROGRESS NOTE   Diagnosis: Colon cancer  INTERVAL HISTORY:   Emily Lawson returns as scheduled. She completed another cycle of 5-FU/panitumumab on 05/28/2015. She complains of malaise. Good appetite.  Objective:  Vital signs in last 24 hours:  Blood pressure 149/58, pulse 76, temperature 98.4 F (36.9 C), temperature source Oral, resp. rate 18, height _0  (1.549 m), weight 179 lb 8 oz (81.421 kg), SpO2 100 %.    HEENT: No thrush or ulcers Resp: Lungs clear bilaterally Cardio: Regular rate and rhythm GI: Mildly distended, no hepatomegaly, no mass Vascular: No leg edema  Skin: No rash   Portacath/PICC-without erythema  Lab Results:  Lab Results  Component Value Date   WBC 7.3 06/18/2015   HGB 8.0* 06/18/2015   HCT 24.9* 06/18/2015   MCV 101.0 06/18/2015   PLT 305 06/18/2015   NEUTROABS 4.7 06/18/2015      Lab Results  Component Value Date   CEA 17.6* 05/28/2015   Medications: I have reviewed the patient's current medications.  Assessment/Plan: 1. Metastatic colon cancer presenting with synchronous colon primaries in September, 2008, status post a hemicolectomy and liver biopsy 08/03/2007. A CT on 04/20/2010 revealed no evidence metastatic disease. She was maintained on 5-FU/leucovorin per the FOLFOX regimen beginning in April, 2009. Avasta was discontinued from the chemotherapy regimen beginning in November, 2009 due to nephrotic range proteinuria. A CT of the abdomen and pelvis 02/05/2011 revealed evidence of three small liver lesions. An MRI of the abdomen on 03/02/2011 confirmed 3 liver lesions and no additional evidence of metastatic disease. K-RAS testing on the transverse colon and left colon tumors revealed a wild type genotype. Foundation 1 testing revealed no K-ras, NRAS, or BRAF mutation. 1A. Staging PET scan 03/21/2011 confirmed 3 hypermetabolic liver lesions and no additional evidence of metastatic disease.  1B. Surgical  consultation by Dr. Barry Dienes was obtained and Ms. Franklyn decided against an attempt at surgical resection/radiofrequency ablation.  1C. Salvage therapy with FOLFOX was initiated 04/13/2011.  1D. A restaging CT 06/28/2011 revealed a significant improvement in the hepatic metastasis.  1E. She completed eight cycles of FOLFOX chemotherapy. Treatment was switched to 5FU-leucovorin as per the FOLFOX regimen beginning with cycle #9 due to an allergic reaction (skin rash) following cycle #8.  23F. Restaging CT 10/25/2011 revealed 2 more conspicuous liver metastases .  1G. Restaging PET scan 12/24/2011 revealed an increase in the size and metabolic activity of 3 liver metastases  1H. She began FOLFIRI chemotherapy on 01/19/2012.  1 I. Restaging CT of the abdomen 05/08/2012 revealed a mixed response with enlargement of 2 liver lesions and a decrease in the size of a third lesion, no new lesions.  1J. FOLFIRI chemotherapy continued with the addition of Avastin beginning 05/09/2012.  1K. restaging CT 07/28/2012 with no new liver lesions and a decreased size of the previously noted 3 liver lesions.  1L. She was last treated with FOLFIRI/Avastin on 09/12/2012.  10M. She completed stereotactic radiotherapy to 3 liver metastases 10/10/2012 through 10/20/2012.  1N. Follow-up MRI of the abdomen 11/24/2012 showed improvement in the 3 hepatic metastases and no progressive disease.  1O. Restaging MRI 04/09/2013 showed improvement in the 3 treated hepatic lesions and 2 new lesions. No evidence of metastatic disease outside of liver.  1P. Initiation of Xeloda/Avastin 05/30/2013. Cycle 2 initiated on 06/20/2013 (Avastin placed on hold)-Xeloda discontinued after 10 days secondary to hand/foot syndrome. Cycle 3 Xeloda question initiated on 07/28/2013 (she is unable to recall when she started  or stopped cycle 3). Cycle 4 08/20/2013. Cycle 5 09/14/2013 (no Xeloda). Cycle 6 Xeloda/Avastin 10/01/2013. Cycle 7  Xeloda/Avastin 11/02/2013  1Q. restaging MRI of the liver 11/16/2013-3 enhancing right liver lesion, slightly larger. No evidence of metastasis in the left hepatic lobe.  1R. Xeloda/Avastin continued.  1S. restaging PET scan 01/28/2014 showed 2 hypermetabolic lesions in the liver corresponding to the abnormality seen on the recent MRI. The third lesion seen on the recent MRI was not discernible on the PET. No other evidence for hypermetabolic metastases.  [name] continued.  1U. CT abdomen/pelvis 06/14/2014 with new moderate ascites; large infiltrating lesions again noted within the right hepatic lobe difficult to fully assess on CT. Mildly increased hepatic atrophy with regard to the 2 larger lesions. A smaller lobulated lesion had increased mildly in size. 1V. CT chest/abdomen 09/09/2014 with progression of multifocal hepatic metastases. No evidence of metastatic disease in the chest. 1W. Initiation of 5-fluorouracil/Panitumumab on a 2 week schedule 09/10/2014. 1X. CT 01/20/2015 with likely mild improvement in multifocal hepatic metastases. 1Y. Continuation of 5-FU/Panitumumab on a 3 week schedule   1. Diabetes. 2. History of proteinuria secondary to Avastin versus diabetes. 3. Mucositis and hand foot syndrome, secondary to 5FU improved with a dose reduction of the 5-FU. 4. 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 Left back and left leg pain with an MRI of the lumbar spine confirming lumbar disk disease. She is status post surgery by Dr. Christella Noa 02/14/2008 with resolution of the back and left leg pain. 6. Rosacea. She is followed by dermatology.  7. Depression, maintained on Cymbalta 8. History of bilateral knee pain secondary to arthritis -- she takes Vicodin as needed. 9. History of anorexia -- weight loss -- ?Related to depression/anxiety or metastatic colon cancer. 10. Allergic reaction to oxaliplatin with FOLFOX on 04/13/2011. She was able to  tolerate FOLFOX on 04/20/2011 with steroid premedication and a prolonged oxaliplatin infusion. After cycle #8 she developed a rash upon returning home with pruritus. She took Benadryl with resolution of symptoms. This reaction occurred despite steroid premedication. 11. Poor dentition -- she was evaluated by Dr. Enrique Sack. She underwent tooth extractions 07/04/2013. She now has dentures.  12. Neutropenia following cycle 1 of FOLFIRI. The FOLFIRI was dose reduced beginning with cycle 2. She was neutropenic 03/14/2012 and chemotherapy was held for one week.  13. Nausea and diarrhea during the irinotecan infusion on 03/21/2012. Symptoms resolved with atropine. She received atropine as a premedication with each cycle. 14. Diarrhea following the 05/09/2012 chemotherapy cycle. Likely related to irinotecan. She takes Imodium as needed. 15. History of Diffuse pain 4-5 days after chemotherapy 07/17/2012-likely toxicity from Neulasta. 16. History of a fall with fracture of the right fifth metacarpal. 17. Hand/foot syndrome secondary to Xeloda-the Xeloda was dose reduced beginning with cycle 3 on 07/23/2013 (?). 18. Hypomagnesemia secondary to Panitumumab. 19. Anemia, multifactorial likely secondary to renal insufficiency, chronic disease and chemotherapy. Trial of weekly Procrit initiated 02/12/2015. 03/26/2015 hemoglobin unchanged and Procrit discontinued. 20. Ascites-status post a therapeutic paracentesis 03/28/2015, negative cytology     Disposition:  She complains of malaise. This is likely secondary to anemia. She will be scheduled for a red cell transfusion within the next few days. I will refer her for a restaging CT if the CEA is higher today. Ms. Penelope Coop will return for an office visit in 3 weeks. We will screen her for the MET amplified trial when the research program reopens.  Betsy Coder, MD  06/18/2015  11:24  AM    

## 2015-06-18 NOTE — Telephone Encounter (Signed)
Gave adn pritned appt sched and avs for pt for July and Aug

## 2015-06-19 ENCOUNTER — Other Ambulatory Visit: Payer: Self-pay | Admitting: *Deleted

## 2015-06-19 ENCOUNTER — Telehealth: Payer: Self-pay | Admitting: *Deleted

## 2015-06-19 ENCOUNTER — Other Ambulatory Visit: Payer: Self-pay | Admitting: Oncology

## 2015-06-19 DIAGNOSIS — C189 Malignant neoplasm of colon, unspecified: Secondary | ICD-10-CM

## 2015-06-19 LAB — CEA: CEA: 18.2 ng/mL — AB (ref 0.0–5.0)

## 2015-06-19 NOTE — Telephone Encounter (Signed)
Opened in error

## 2015-06-19 NOTE — Telephone Encounter (Signed)
-----   Message from Ladell Pier, MD sent at 06/19/2015  7:50 AM EDT ----- Please call patient, cea slightly higher, schedule abd/pelvis-oral contrast only a few days prior to next visit

## 2015-06-19 NOTE — Telephone Encounter (Signed)
Made pt aware of information below. Pt voices understanding.

## 2015-06-20 ENCOUNTER — Ambulatory Visit (HOSPITAL_BASED_OUTPATIENT_CLINIC_OR_DEPARTMENT_OTHER): Payer: PPO

## 2015-06-20 VITALS — BP 139/62 | HR 78 | Temp 98.0°F | Resp 18

## 2015-06-20 DIAGNOSIS — D6481 Anemia due to antineoplastic chemotherapy: Secondary | ICD-10-CM

## 2015-06-20 DIAGNOSIS — D638 Anemia in other chronic diseases classified elsewhere: Secondary | ICD-10-CM

## 2015-06-20 DIAGNOSIS — D6489 Other specified anemias: Secondary | ICD-10-CM

## 2015-06-20 MED ORDER — SODIUM CHLORIDE 0.9 % IJ SOLN
10.0000 mL | INTRAMUSCULAR | Status: AC | PRN
  Administered 2015-06-20: 10 mL
  Filled 2015-06-20: qty 10

## 2015-06-20 MED ORDER — SODIUM CHLORIDE 0.9 % IV SOLN
250.0000 mL | Freq: Once | INTRAVENOUS | Status: AC
  Administered 2015-06-20: 250 mL via INTRAVENOUS

## 2015-06-20 MED ORDER — HEPARIN SOD (PORK) LOCK FLUSH 100 UNIT/ML IV SOLN
500.0000 [IU] | Freq: Every day | INTRAVENOUS | Status: AC | PRN
  Administered 2015-06-20: 500 [IU]
  Filled 2015-06-20: qty 5

## 2015-06-20 NOTE — Patient Instructions (Addendum)
Blood Transfusion Information WHAT IS A BLOOD TRANSFUSION? A transfusion is the replacement of blood or some of its parts. Blood is made up of multiple cells which provide different functions.  Red blood cells carry oxygen and are used for blood loss replacement.  White blood cells fight against infection.  Platelets control bleeding.  Plasma helps clot blood.  Other blood products are available for specialized needs, such as hemophilia or other clotting disorders. BEFORE THE TRANSFUSION  Who gives blood for transfusions?   You may be able to donate blood to be used at a later date on yourself (autologous donation).  Relatives can be asked to donate blood. This is generally not any safer than if you have received blood from a stranger. The same precautions are taken to ensure safety when a relative's blood is donated.  Healthy volunteers who are fully evaluated to make sure their blood is safe. This is blood bank blood. Transfusion therapy is the safest it has ever been in the practice of medicine. Before blood is taken from a donor, a complete history is taken to make sure that person has no history of diseases nor engages in risky social behavior (examples are intravenous drug use or sexual activity with multiple partners). The donor's travel history is screened to minimize risk of transmitting infections, such as malaria. The donated blood is tested for signs of infectious diseases, such as HIV and hepatitis. The blood is then tested to be sure it is compatible with you in order to minimize the chance of a transfusion reaction. If you or a relative donates blood, this is often done in anticipation of surgery and is not appropriate for emergency situations. It takes many days to process the donated blood. RISKS AND COMPLICATIONS Although transfusion therapy is very safe and saves many lives, the main dangers of transfusion include:   Getting an infectious disease.  Developing a  transfusion reaction. This is an allergic reaction to something in the blood you were given. Every precaution is taken to prevent this. The decision to have a blood transfusion has been considered carefully by your caregiver before blood is given. Blood is not given unless the benefits outweigh the risks. AFTER THE TRANSFUSION  Right after receiving a blood transfusion, you will usually feel much better and more energetic. This is especially true if your red blood cells have gotten low (anemic). The transfusion raises the level of the red blood cells which carry oxygen, and this usually causes an energy increase.  The nurse administering the transfusion will monitor you carefully for complications. HOME CARE INSTRUCTIONS  No special instructions are needed after a transfusion. You may find your energy is better. Speak with your caregiver about any limitations on activity for underlying diseases you may have. SEEK MEDICAL CARE IF:   Your condition is not improving after your transfusion.  You develop redness or irritation at the intravenous (IV) site. SEEK IMMEDIATE MEDICAL CARE IF:  Any of the following symptoms occur over the next 12 hours:  Shaking chills.  You have a temperature by mouth above 102 F (38.9 C), not controlled by medicine.  Chest, back, or muscle pain.  People around you feel you are not acting correctly or are confused.  Shortness of breath or difficulty breathing.  Dizziness and fainting.  You get a rash or develop hives.  You have a decrease in urine output.  Your urine turns a dark color or changes to pink, red, or brown. Any of the following   symptoms occur over the next 10 days:  You have a temperature by mouth above 102 F (38.9 C), not controlled by medicine.  Shortness of breath.  Weakness after normal activity.  The white part of the eye turns yellow (jaundice).  You have a decrease in the amount of urine or are urinating less often.  Your  urine turns a dark color or changes to pink, red, or brown. Document Released: 11/12/2000 Document Revised: 02/07/2012 Document Reviewed: 07/01/2008 [facility] Patient Information 2015 [place], [place]. This information is not intended to replace advice given to you by your health care provider. Make sure you discuss any questions you have with your health care provider.  T

## 2015-06-21 LAB — TYPE AND SCREEN
ABO/RH(D): A POS
Antibody Screen: NEGATIVE
Unit division: 0
Unit division: 0

## 2015-06-24 ENCOUNTER — Telehealth: Payer: Self-pay | Admitting: *Deleted

## 2015-06-24 NOTE — Telephone Encounter (Signed)
ON 06/18/15 PT. RECEIVED CHEMOTHERAPY AND BLOOD IN HER RIGHT ARM.THEN SHE WAS HOOKED UP TO 5FU PUMP IN HER PORT A CATH ON THE LEFT SIDE OF CHEST. ON 06/19/15 PT. BECAME TANGLED IN THE LINE FROM HER PUMP AND FELL ON HER RIGHT SIDE AND ARM. ON 06/23/15 PT. AND CAREGIVER NOTICED A BRUISE ON PT.'S RIGHT ARM FROM HER ELBOW TO HER SHOULDER. TODAY THIS AREA IS MUCH DARKER. VERBAL ORDER AND READ BACK TO DR.Katy. NEEDS TO CONTACT AND SEE HER PRIMARY CARE PHYSICIAN. NOTIFIED PT. AND HER CARE GIVER,BRANDY, OF DR.SHERRILL'S INSTRUCTIONS.

## 2015-06-30 ENCOUNTER — Encounter: Payer: Self-pay | Admitting: *Deleted

## 2015-07-01 ENCOUNTER — Other Ambulatory Visit: Payer: Self-pay | Admitting: Oncology

## 2015-07-01 DIAGNOSIS — C189 Malignant neoplasm of colon, unspecified: Secondary | ICD-10-CM

## 2015-07-04 ENCOUNTER — Telehealth: Payer: Self-pay | Admitting: *Deleted

## 2015-07-04 NOTE — Telephone Encounter (Signed)
Message from pt reporting she is unable to take the milky contrast, requesting a different contrast. Informed her she will need to arrive at radiology at 1015 to drink water based contrast if she is unable to drink barium. Spoke with Elane Fritz, she stated pt needed a different flavor, which she has. She will drink at home and check in for CT Monday at 1215.

## 2015-07-05 ENCOUNTER — Other Ambulatory Visit: Payer: Self-pay | Admitting: Oncology

## 2015-07-07 ENCOUNTER — Ambulatory Visit (HOSPITAL_COMMUNITY)
Admission: RE | Admit: 2015-07-07 | Discharge: 2015-07-07 | Disposition: A | Discharge status: Home / Self Care | Payer: PPO | Source: Ambulatory Visit | Attending: Oncology | Admitting: Oncology

## 2015-07-07 ENCOUNTER — Encounter (HOSPITAL_COMMUNITY): Payer: Self-pay

## 2015-07-07 DIAGNOSIS — K769 Liver disease, unspecified: Secondary | ICD-10-CM | POA: Insufficient documentation

## 2015-07-07 DIAGNOSIS — C189 Malignant neoplasm of colon, unspecified: Secondary | ICD-10-CM | POA: Insufficient documentation

## 2015-07-07 DIAGNOSIS — I2510 Atherosclerotic heart disease of native coronary artery without angina pectoris: Secondary | ICD-10-CM | POA: Diagnosis not present

## 2015-07-07 DIAGNOSIS — Z9049 Acquired absence of other specified parts of digestive tract: Secondary | ICD-10-CM | POA: Insufficient documentation

## 2015-07-07 DIAGNOSIS — J90 Pleural effusion, not elsewhere classified: Secondary | ICD-10-CM | POA: Diagnosis not present

## 2015-07-07 DIAGNOSIS — E041 Nontoxic single thyroid nodule: Secondary | ICD-10-CM | POA: Insufficient documentation

## 2015-07-07 DIAGNOSIS — C787 Secondary malignant neoplasm of liver and intrahepatic bile duct: Secondary | ICD-10-CM | POA: Diagnosis not present

## 2015-07-07 DIAGNOSIS — I700 Atherosclerosis of aorta: Secondary | ICD-10-CM | POA: Diagnosis not present

## 2015-07-07 DIAGNOSIS — R188 Other ascites: Secondary | ICD-10-CM | POA: Diagnosis not present

## 2015-07-07 DIAGNOSIS — R599 Enlarged lymph nodes, unspecified: Secondary | ICD-10-CM | POA: Diagnosis not present

## 2015-07-09 ENCOUNTER — Other Ambulatory Visit (HOSPITAL_BASED_OUTPATIENT_CLINIC_OR_DEPARTMENT_OTHER): Payer: PPO

## 2015-07-09 ENCOUNTER — Ambulatory Visit (HOSPITAL_BASED_OUTPATIENT_CLINIC_OR_DEPARTMENT_OTHER): Payer: PPO | Admitting: Nurse Practitioner

## 2015-07-09 ENCOUNTER — Telehealth: Payer: Self-pay | Admitting: Nurse Practitioner

## 2015-07-09 ENCOUNTER — Ambulatory Visit: Payer: PPO

## 2015-07-09 VITALS — BP 133/71 | HR 102 | Temp 98.0°F | Resp 18 | Ht 61.0 in | Wt 173.5 lb

## 2015-07-09 DIAGNOSIS — C188 Malignant neoplasm of overlapping sites of colon: Secondary | ICD-10-CM

## 2015-07-09 DIAGNOSIS — D649 Anemia, unspecified: Secondary | ICD-10-CM

## 2015-07-09 DIAGNOSIS — R188 Other ascites: Secondary | ICD-10-CM

## 2015-07-09 DIAGNOSIS — C189 Malignant neoplasm of colon, unspecified: Secondary | ICD-10-CM

## 2015-07-09 DIAGNOSIS — C787 Secondary malignant neoplasm of liver and intrahepatic bile duct: Secondary | ICD-10-CM

## 2015-07-09 LAB — COMPREHENSIVE METABOLIC PANEL (CC13)
ALT: 14 U/L (ref 0–55)
AST: 32 U/L (ref 5–34)
Albumin: 2.2 g/dL — ABNORMAL LOW (ref 3.5–5.0)
Alkaline Phosphatase: 374 U/L — ABNORMAL HIGH (ref 40–150)
Anion Gap: 10 mEq/L (ref 3–11)
BUN: 21 mg/dL (ref 7.0–26.0)
CHLORIDE: 107 meq/L (ref 98–109)
CO2: 25 meq/L (ref 22–29)
Calcium: 9.4 mg/dL (ref 8.4–10.4)
Creatinine: 1.7 mg/dL — ABNORMAL HIGH (ref 0.6–1.1)
EGFR: 29 mL/min/{1.73_m2} — AB (ref >90)
GLUCOSE: 122 mg/dL (ref 70–140)
POTASSIUM: 3.8 meq/L (ref 3.5–5.1)
Sodium: 142 mEq/L (ref 136–145)
Total Bilirubin: 1.57 mg/dL — ABNORMAL HIGH (ref 0.20–1.20)
Total Protein: 6.1 g/dL — ABNORMAL LOW (ref 6.4–8.3)

## 2015-07-09 LAB — CBC WITH DIFFERENTIAL/PLATELET
BASO%: 0.7 % (ref 0.0–2.0)
Basophils Absolute: 0.1 10*3/uL (ref 0.0–0.1)
EOS%: 6.4 % (ref 0.0–7.0)
Eosinophils Absolute: 0.5 10*3/uL (ref 0.0–0.5)
HCT: 29.7 % — ABNORMAL LOW (ref 34.8–46.6)
HGB: 9.7 g/dL — ABNORMAL LOW (ref 11.6–15.9)
LYMPH%: 37.4 % (ref 14.0–49.7)
MCH: 32.4 pg (ref 25.1–34.0)
MCHC: 32.7 g/dL (ref 31.5–36.0)
MCV: 99.3 fL (ref 79.5–101.0)
MONO#: 0.8 10*3/uL (ref 0.1–0.9)
MONO%: 9.8 % (ref 0.0–14.0)
NEUT#: 3.5 10*3/uL (ref 1.5–6.5)
NEUT%: 45.7 % (ref 38.4–76.8)
Platelets: 280 10*3/uL (ref 145–400)
RBC: 2.99 10*6/uL — AB (ref 3.70–5.45)
RDW: 20.4 % — ABNORMAL HIGH (ref 11.2–14.5)
WBC: 7.7 10*3/uL (ref 3.9–10.3)
lymph#: 2.9 10*3/uL (ref 0.9–3.3)

## 2015-07-09 LAB — MAGNESIUM (CC13): Magnesium: 1 mg/dl — CL (ref 1.5–2.5)

## 2015-07-09 NOTE — Telephone Encounter (Signed)
Pt confirmed labs/ov per 08/10 POF, gave pt avs and calendar... KJ, scheduled [place] for pt

## 2015-07-09 NOTE — Progress Notes (Addendum)
Emily Lawson OFFICE PROGRESS NOTE   Diagnosis:  Colon cancer  INTERVAL HISTORY:   Emily Lawson returns as scheduled. She completed another cycle of 5-FU/PANITUMUMAB 06/18/2015. She denies nausea/vomiting. No mouth sores. No diarrhea following the chemotherapy. She did develop diarrhea following oral contrast for the recent CT scan. She denies shortness of breath. She fell recently landing on the right arm and shoulder. She reports a significant bruise on the right arm. The bruise is improving. Since the fall she has noted occasional pain at the right chest which worsens with deep inspiration. No shortness of breath. No cough or fever. She felt "a little better" after the recent blood transfusion.  Objective:  Vital signs in last 24 hours:  Blood pressure 133/71, pulse 102, temperature 98 F (36.7 C), temperature source Oral, resp. rate 18, height 5' 1"  (1.549 m), weight 173 lb 8 oz (78.699 kg), SpO2 100 %.    HEENT: No thrush or ulcers. Resp: Lungs clear bilaterally. Cardio: Regular rate and rhythm. GI: Abdomen is distended. No hepatomegaly. No mass. Vascular: Trace bilateral ankle and pedal edema. Skin: Large ecchymosis over the right arm. Musculoskeletal: Tender right upper medial chest adjacent to the sternum.  Port-A-Cath without erythema.   Lab Results:  Lab Results  Component Value Date   WBC 7.7 07/09/2015   HGB 9.7* 07/09/2015   HCT 29.7* 07/09/2015   MCV 99.3 07/09/2015   PLT 280 07/09/2015   NEUTROABS 3.5 07/09/2015    Imaging:  Ct Abdomen Pelvis Wo Contrast  07/07/2015   CLINICAL DATA:  Metastatic colon cancer Subsequent encounter for  EXAM: CT CHEST, ABDOMEN AND PELVIS WITHOUT CONTRAST  TECHNIQUE: Multidetector CT imaging of the chest, abdomen and pelvis was performed following the standard protocol without IV contrast.  COMPARISON:  Abdomen and pelvis CT from 01/20/2015. Chest abdomen and pelvis CT from 09/09/2014.  FINDINGS: CT CHEST FINDINGS   Mediastinum/Nodes: Left Port-A-Cath tip projects at the SVC/ RA junction. There is no axillary lymphadenopathy. No mediastinal lymphadenopathy. There is no hilar lymphadenopathy. Heart size is upper normal. Coronary artery calcification is evident. Left thyroid nodule again noted.  Lungs/Pleura: Atelectasis is noted in the lung bases. There is a tiny left-sided pleural effusion, new in the interval. No focal airspace consolidation. No pulmonary nodule or mass.  Musculoskeletal: Bone windows reveal no worrisome lytic or sclerotic osseous lesions.  CT ABDOMEN AND PELVIS FINDINGS  Hepatobiliary: The multiple liver lesions are less well demonstrated on today's study given the lack of intravenous contrast material. There is some calcification associated with the lesion in the posterior aspect of the inferior right liver. Gallbladder is surgically absent. No intrahepatic or extrahepatic biliary dilation.  Pancreas: No focal mass lesion. No dilatation of the main duct. No intraparenchymal cyst. No peripancreatic edema.  Spleen: No splenomegaly. No focal mass lesion.  Adrenals/Urinary Tract: No adrenal nodule or mass. Stable 3.2 cm water density lesion in the interpolar left kidney, compatible with a cyst. Right kidney is unremarkable. No evidence for hydroureter. The urinary bladder appears normal for the degree of distention.  Stomach/Bowel: Stomach is nondistended. No gastric wall thickening. No evidence of outlet obstruction. Duodenum diverticuli are evident. No small bowel wall thickening. No small bowel dilatation. The terminal ileum is normal. The appendix is not visualized, but there is no edema or inflammation in the region of the cecum. Patient is status post left colectomy.  Vascular/Lymphatic: There is abdominal aortic atherosclerosis without aneurysm. There is no gastrohepatic or hepatoduodenal ligament lymphadenopathy. Borderline enlarged  para-aortic lymph nodes are stable. One of the larger lymph nodes is  seen in the left para-aortic space measures 11 mm in short axis (Image 68 series 2). No mesenteric or pelvic sidewall lymphadenopathy.  Reproductive: Uterus is normal in appearance. There is no adnexal mass.  Other: Large volume abdominal ascites has progressed in the interval. There is a large amount of free fluid in the anatomic pelvis.  Musculoskeletal: Interval progression of subcutaneous edema diffusely consistent with body wall edema/anasarca. Bone windows reveal no worrisome lytic or sclerotic osseous lesions.  IMPRESSION: 1. No evidence for metastatic disease in the chest. 2. Multi focal liver metastases, more difficult to directly compare to previous study given the lack of intravenous contrast material today. There is does not appear to be a substantial change and metastatic burden to the liver. 3. Clear and marked interval progression of ascites. 4. Interval development/worsening of body wall edema. 5. Stable borderline para-aortic lymphadenopathy.   Electronically Signed   By: Misty Stanley M.D.   On: 07/07/2015 14:01   Ct Chest Wo Contrast  07/07/2015   CLINICAL DATA:  Metastatic colon cancer Subsequent encounter for  EXAM: CT CHEST, ABDOMEN AND PELVIS WITHOUT CONTRAST  TECHNIQUE: Multidetector CT imaging of the chest, abdomen and pelvis was performed following the standard protocol without IV contrast.  COMPARISON:  Abdomen and pelvis CT from 01/20/2015. Chest abdomen and pelvis CT from 09/09/2014.  FINDINGS: CT CHEST FINDINGS  Mediastinum/Nodes: Left Port-A-Cath tip projects at the SVC/ RA junction. There is no axillary lymphadenopathy. No mediastinal lymphadenopathy. There is no hilar lymphadenopathy. Heart size is upper normal. Coronary artery calcification is evident. Left thyroid nodule again noted.  Lungs/Pleura: Atelectasis is noted in the lung bases. There is a tiny left-sided pleural effusion, new in the interval. No focal airspace consolidation. No pulmonary nodule or mass.   Musculoskeletal: Bone windows reveal no worrisome lytic or sclerotic osseous lesions.  CT ABDOMEN AND PELVIS FINDINGS  Hepatobiliary: The multiple liver lesions are less well demonstrated on today's study given the lack of intravenous contrast material. There is some calcification associated with the lesion in the posterior aspect of the inferior right liver. Gallbladder is surgically absent. No intrahepatic or extrahepatic biliary dilation.  Pancreas: No focal mass lesion. No dilatation of the main duct. No intraparenchymal cyst. No peripancreatic edema.  Spleen: No splenomegaly. No focal mass lesion.  Adrenals/Urinary Tract: No adrenal nodule or mass. Stable 3.2 cm water density lesion in the interpolar left kidney, compatible with a cyst. Right kidney is unremarkable. No evidence for hydroureter. The urinary bladder appears normal for the degree of distention.  Stomach/Bowel: Stomach is nondistended. No gastric wall thickening. No evidence of outlet obstruction. Duodenum diverticuli are evident. No small bowel wall thickening. No small bowel dilatation. The terminal ileum is normal. The appendix is not visualized, but there is no edema or inflammation in the region of the cecum. Patient is status post left colectomy.  Vascular/Lymphatic: There is abdominal aortic atherosclerosis without aneurysm. There is no gastrohepatic or hepatoduodenal ligament lymphadenopathy. Borderline enlarged para-aortic lymph nodes are stable. One of the larger lymph nodes is seen in the left para-aortic space measures 11 mm in short axis (Image 68 series 2). No mesenteric or pelvic sidewall lymphadenopathy.  Reproductive: Uterus is normal in appearance. There is no adnexal mass.  Other: Large volume abdominal ascites has progressed in the interval. There is a large amount of free fluid in the anatomic pelvis.  Musculoskeletal: Interval progression of subcutaneous edema diffusely consistent  with body wall edema/anasarca. Bone windows  reveal no worrisome lytic or sclerotic osseous lesions.  IMPRESSION: 1. No evidence for metastatic disease in the chest. 2. Multi focal liver metastases, more difficult to directly compare to previous study given the lack of intravenous contrast material today. There is does not appear to be a substantial change and metastatic burden to the liver. 3. Clear and marked interval progression of ascites. 4. Interval development/worsening of body wall edema. 5. Stable borderline para-aortic lymphadenopathy.   Electronically Signed   By: Misty Stanley M.D.   On: 07/07/2015 14:01    Medications: I have reviewed the patient's current medications.  Assessment/Plan: 1. Metastatic colon cancer presenting with synchronous colon primaries in September, 2008, status post a hemicolectomy and liver biopsy 08/03/2007. A CT on 04/20/2010 revealed no evidence metastatic disease. She was maintained on 5-FU/leucovorin per the FOLFOX regimen beginning in April, 2009. Avasta was discontinued from the chemotherapy regimen beginning in November, 2009 due to nephrotic range proteinuria. A CT of the abdomen and pelvis 02/05/2011 revealed evidence of three small liver lesions. An MRI of the abdomen on 03/02/2011 confirmed 3 liver lesions and no additional evidence of metastatic disease. K-RAS testing on the transverse colon and left colon tumors revealed a wild type genotype. Foundation 1 testing revealed no K-ras, NRAS, or BRAF mutation. 1A. Staging PET scan 03/21/2011 confirmed 3 hypermetabolic liver lesions and no additional evidence of metastatic disease.  1B. Surgical consultation by Dr. Barry Dienes was obtained and Emily Lawson decided against an attempt at surgical resection/radiofrequency ablation.  1C. Salvage therapy with FOLFOX was initiated 04/13/2011.  1D. A restaging CT 06/28/2011 revealed a significant improvement in the hepatic metastasis.  1E. She completed eight cycles of FOLFOX chemotherapy. Treatment was switched to  5FU-leucovorin as per the FOLFOX regimen beginning with cycle #9 due to an allergic reaction (skin rash) following cycle #8.  62F. Restaging CT 10/25/2011 revealed 2 more conspicuous liver metastases .  1G. Restaging PET scan 12/24/2011 revealed an increase in the size and metabolic activity of 3 liver metastases  1H. She began FOLFIRI chemotherapy on 01/19/2012.  1 I. Restaging CT of the abdomen 05/08/2012 revealed a mixed response with enlargement of 2 liver lesions and a decrease in the size of a third lesion, no new lesions.  1J. FOLFIRI chemotherapy continued with the addition of Avastin beginning 05/09/2012.  1K. restaging CT 07/28/2012 with no new liver lesions and a decreased size of the previously noted 3 liver lesions.  1L. She was last treated with FOLFIRI/Avastin on 09/12/2012.  72M. She completed stereotactic radiotherapy to 3 liver metastases 10/10/2012 through 10/20/2012.  1N. Follow-up MRI of the abdomen 11/24/2012 showed improvement in the 3 hepatic metastases and no progressive disease.  1O. Restaging MRI 04/09/2013 showed improvement in the 3 treated hepatic lesions and 2 new lesions. No evidence of metastatic disease outside of liver.  1P. Initiation of Xeloda/Avastin 05/30/2013. Cycle 2 initiated on 06/20/2013 (Avastin placed on hold)-Xeloda discontinued after 10 days secondary to hand/foot syndrome. Cycle 3 Xeloda question initiated on 07/28/2013 (she is unable to recall when she started or stopped cycle 3). Cycle 4 08/20/2013. Cycle 5 09/14/2013 (no Xeloda). Cycle 6 Xeloda/Avastin 10/01/2013. Cycle 7 Xeloda/Avastin 11/02/2013  1Q. restaging MRI of the liver 11/16/2013-3 enhancing right liver lesion, slightly larger. No evidence of metastasis in the left hepatic lobe.  1R. Xeloda/Avastin continued.  1S. restaging PET scan 01/28/2014 showed 2 hypermetabolic lesions in the liver corresponding to the abnormality seen on the recent MRI.  The third lesion seen on the recent  MRI was not discernible on the PET. No other evidence for hypermetabolic metastases.  Emily Lawson continued.  1U. CT abdomen/pelvis 06/14/2014 with new moderate ascites; large infiltrating lesions again noted within the right hepatic lobe difficult to fully assess on CT. Mildly increased hepatic atrophy with regard to the 2 larger lesions. A smaller lobulated lesion had increased mildly in size. 1V. CT chest/abdomen 09/09/2014 with progression of multifocal hepatic metastases. No evidence of metastatic disease in the chest. 1W. Initiation of 5-fluorouracil/Panitumumab on a 2 week schedule 09/10/2014. 1X. CT 01/20/2015 with likely mild improvement in multifocal hepatic metastases. 1Y. Continuation of 5-FU/Panitumumab on a 3 week schedule  1Z.   No evidence for metastatic disease in the chest. Multifocal liver metastases without substantial change. Clear and marked interval progression of ascites. Interval development/worsening of body wall edema. Stable borderline para-aortic lymphadenopathy.  1. Diabetes. 2. History of proteinuria secondary to Avastin versus diabetes. 3. Mucositis and hand foot syndrome, secondary to 5FU improved with a dose reduction of the 5-FU. 4. 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 Left back and left leg pain with an MRI of the lumbar spine confirming lumbar disk disease. She is status post surgery by Dr. Christella Noa 02/14/2008 with resolution of the back and left leg pain. 6. Rosacea. She is followed by dermatology.  7. Depression, maintained on Cymbalta 8. History of bilateral knee pain secondary to arthritis -- she takes Vicodin as needed. 9. History of anorexia -- weight loss -- ?Related to depression/anxiety or metastatic colon cancer. 10. Allergic reaction to oxaliplatin with FOLFOX on 04/13/2011. She was able to tolerate FOLFOX on 04/20/2011 with steroid premedication and a prolonged oxaliplatin infusion. After cycle #8 she developed  a rash upon returning home with pruritus. She took Benadryl with resolution of symptoms. This reaction occurred despite steroid premedication. 11. Poor dentition -- she was evaluated by Dr. Enrique Sack. She underwent tooth extractions 07/04/2013. She now has dentures.  12. Neutropenia following cycle 1 of FOLFIRI. The FOLFIRI was dose reduced beginning with cycle 2. She was neutropenic 03/14/2012 and chemotherapy was held for one week.  13. Nausea and diarrhea during the irinotecan infusion on 03/21/2012. Symptoms resolved with atropine. She received atropine as a premedication with each cycle. 14. Diarrhea following the 05/09/2012 chemotherapy cycle. Likely related to irinotecan. She takes Imodium as needed. 15. History of Diffuse pain 4-5 days after chemotherapy 07/17/2012-likely toxicity from Neulasta. 16. History of a fall with fracture of the right fifth metacarpal. 17. Hand/foot syndrome secondary to Xeloda-the Xeloda was dose reduced beginning with cycle 3 on 07/23/2013 (?). 18. Hypomagnesemia secondary to Panitumumab. 19. Anemia, multifactorial likely secondary to renal insufficiency, chronic disease and chemotherapy. Trial of weekly Procrit initiated 02/12/2015. 03/26/2015 hemoglobin unchanged and Procrit discontinued. 20. Ascites-status post a therapeutic paracentesis 03/28/2015, negative cytology     Disposition: Emily Lawson appears unchanged. The recent restaging CT evaluation showed stable disease in the liver. There was marked interval progression of ascites. Dr. Benay Spice recommends placing chemotherapy on hold. We are referring her for a diagnostic/therapeutic paracentesis. She will return for a follow-up visit on 07/21/2015.  Patient seen with Dr. Benay Spice.  Ned Card ANP/GNP-BC   07/09/2015  11:15 AM  This was a shared visit with Ned Card. Emily Lawson was interviewed and examined.  The restaging CT reveals no evidence of disease progression other than progressive ascites.  She will be referred for a therapeutic and diagnostic paracentesis. We placed further systemic therapy  on hold.  Julieanne Manson, M.D.

## 2015-07-10 ENCOUNTER — Ambulatory Visit (HOSPITAL_COMMUNITY)
Admission: RE | Admit: 2015-07-10 | Discharge: 2015-07-10 | Disposition: A | Discharge status: Home / Self Care | Payer: PPO | Source: Ambulatory Visit | Attending: Nurse Practitioner | Admitting: Nurse Practitioner

## 2015-07-10 DIAGNOSIS — C189 Malignant neoplasm of colon, unspecified: Secondary | ICD-10-CM | POA: Diagnosis not present

## 2015-07-10 DIAGNOSIS — C787 Secondary malignant neoplasm of liver and intrahepatic bile duct: Secondary | ICD-10-CM | POA: Diagnosis not present

## 2015-07-10 DIAGNOSIS — R188 Other ascites: Secondary | ICD-10-CM | POA: Diagnosis not present

## 2015-07-10 LAB — BODY FLUID CELL COUNT WITH DIFFERENTIAL
Eos, Fluid: 0 %
Lymphs, Fluid: 39 %
MONOCYTE-MACROPHAGE-SEROUS FLUID: 58 % (ref 50–90)
Neutrophil Count, Fluid: 3 % (ref 0–25)
WBC FLUID: 91 uL (ref 0–1000)

## 2015-07-10 LAB — PROTEIN, BODY FLUID: Total protein, fluid: <3 g/dL

## 2015-07-10 NOTE — Procedures (Signed)
Ultrasound-guided diagnostic and therapeutic paracentesis performed yielding 5 L clear, yellow fluid. A portion of the fluid was sent to the lab for preordered studies. No immediate complications.

## 2015-07-19 ENCOUNTER — Other Ambulatory Visit: Payer: Self-pay | Admitting: Oncology

## 2015-07-21 ENCOUNTER — Encounter: Payer: Self-pay | Admitting: Oncology

## 2015-07-21 ENCOUNTER — Other Ambulatory Visit (HOSPITAL_BASED_OUTPATIENT_CLINIC_OR_DEPARTMENT_OTHER): Payer: PPO

## 2015-07-21 ENCOUNTER — Ambulatory Visit (HOSPITAL_BASED_OUTPATIENT_CLINIC_OR_DEPARTMENT_OTHER): Payer: PPO | Admitting: Oncology

## 2015-07-21 ENCOUNTER — Telehealth: Payer: Self-pay | Admitting: Oncology

## 2015-07-21 VITALS — BP 154/65 | HR 90 | Temp 98.6°F | Resp 20

## 2015-07-21 DIAGNOSIS — C188 Malignant neoplasm of overlapping sites of colon: Secondary | ICD-10-CM | POA: Diagnosis not present

## 2015-07-21 DIAGNOSIS — C787 Secondary malignant neoplasm of liver and intrahepatic bile duct: Secondary | ICD-10-CM | POA: Diagnosis not present

## 2015-07-21 DIAGNOSIS — C189 Malignant neoplasm of colon, unspecified: Secondary | ICD-10-CM

## 2015-07-21 DIAGNOSIS — E119 Type 2 diabetes mellitus without complications: Secondary | ICD-10-CM

## 2015-07-21 DIAGNOSIS — D649 Anemia, unspecified: Secondary | ICD-10-CM

## 2015-07-21 LAB — CBC WITH DIFFERENTIAL/PLATELET
BASO%: 1.3 % (ref 0.0–2.0)
Basophils Absolute: 0.1 10*3/uL (ref 0.0–0.1)
EOS%: 3.1 % (ref 0.0–7.0)
Eosinophils Absolute: 0.3 10*3/uL (ref 0.0–0.5)
HCT: 29.4 % — ABNORMAL LOW (ref 34.8–46.6)
HGB: 9.6 g/dL — ABNORMAL LOW (ref 11.6–15.9)
LYMPH%: 27.9 % (ref 14.0–49.7)
MCH: 32.8 pg (ref 25.1–34.0)
MCHC: 32.7 g/dL (ref 31.5–36.0)
MCV: 100.2 fL (ref 79.5–101.0)
MONO#: 0.7 10*3/uL (ref 0.1–0.9)
MONO%: 6.4 % (ref 0.0–14.0)
NEUT%: 61.3 % (ref 38.4–76.8)
NEUTROS ABS: 6.9 10*3/uL — AB (ref 1.5–6.5)
PLATELETS: 348 10*3/uL (ref 145–400)
RBC: 2.94 10*6/uL — ABNORMAL LOW (ref 3.70–5.45)
RDW: 22.7 % — ABNORMAL HIGH (ref 11.2–14.5)
WBC: 11.2 10*3/uL — AB (ref 3.9–10.3)
lymph#: 3.1 10*3/uL (ref 0.9–3.3)

## 2015-07-21 LAB — COMPREHENSIVE METABOLIC PANEL (CC13)
ALT: 18 U/L (ref 0–55)
ANION GAP: 15 meq/L — AB (ref 3–11)
AST: 35 U/L — AB (ref 5–34)
Albumin: 2.2 g/dL — ABNORMAL LOW (ref 3.5–5.0)
Alkaline Phosphatase: 432 U/L — ABNORMAL HIGH (ref 40–150)
BILIRUBIN TOTAL: 1.24 mg/dL — AB (ref 0.20–1.20)
BUN: 28.6 mg/dL — AB (ref 7.0–26.0)
CHLORIDE: 105 meq/L (ref 98–109)
CO2: 18 meq/L — AB (ref 22–29)
CREATININE: 1.8 mg/dL — AB (ref 0.6–1.1)
Calcium: 9 mg/dL (ref 8.4–10.4)
EGFR: 26 mL/min/{1.73_m2} — ABNORMAL LOW (ref >90)
GLUCOSE: 124 mg/dL (ref 70–140)
Potassium: 4.7 mEq/L (ref 3.5–5.1)
Sodium: 138 mEq/L (ref 136–145)
TOTAL PROTEIN: 6.1 g/dL — AB (ref 6.4–8.3)

## 2015-07-21 LAB — MAGNESIUM (CC13): Magnesium: 1.3 mg/dl — CL (ref 1.5–2.5)

## 2015-07-21 NOTE — Telephone Encounter (Signed)
per pof to sch pt appt-gave pt copy of avs °

## 2015-07-21 NOTE — Progress Notes (Signed)
[facility] OFFICE PROGRESS NOTE   Diagnosis:  Colon cancer  INTERVAL HISTORY:   Emily Lawson returns as scheduled. She had a paracentesis performed on 07/10/15 with 5 liters of fluids removed. She denies nausea/vomiting. No mouth sores. No diarrhea. She denies shortness of breath. s improving. Since the fall she has noted occasional pain at the right chest which worsens with deep inspiration. No shortness of breath. No cough or fever.   Objective:  Vital signs in last 24 hours:  Blood pressure 154/65, pulse 90, temperature 98.6 F (37 C), resp. rate 20, SpO2 100 %.    HEENT: No thrush or ulcers. Resp: Lungs clear bilaterally. Cardio: Regular rate and rhythm. GI: Abdomen is slightly distended. No hepatomegaly. No mass. Vascular: Trace bilateral ankle and pedal edema. Skin: Large ecchymosis over the right arm. Musculoskeletal: Tender right upper medial chest adjacent to the sternum.  Port-A-Cath without erythema.   Lab Results:  Lab Results  Component Value Date   WBC 11.2* 07/21/2015   HGB 9.6* 07/21/2015   HCT 29.4* 07/21/2015   MCV 100.2 07/21/2015   PLT 348 07/21/2015   NEUTROABS 6.9* 07/21/2015    Imaging:  No results found.  Medications: I have reviewed the patient's current medications.  Assessment/Plan: 1. Metastatic colon cancer presenting with synchronous colon primaries in September, 2008, status post a hemicolectomy and liver biopsy 08/03/2007. A CT on 04/20/2010 revealed no evidence metastatic disease. She was maintained on 5-FU/leucovorin per the FOLFOX regimen beginning in April, 2009. Avasta was discontinued from the chemotherapy regimen beginning in November, 2009 due to nephrotic range proteinuria. A CT of the abdomen and pelvis 02/05/2011 revealed evidence of three small liver lesions. An MRI of the abdomen on 03/02/2011 confirmed 3 liver lesions and no additional evidence of metastatic disease. K-RAS testing on the transverse colon and  left colon tumors revealed a wild type genotype. Foundation 1 testing revealed no K-ras, NRAS, or BRAF mutation. 1A. Staging PET scan 03/21/2011 confirmed 3 hypermetabolic liver lesions and no additional evidence of metastatic disease.  1B. Surgical consultation by Dr. Barry Dienes was obtained and Ms. Morell decided against an attempt at surgical resection/radiofrequency ablation.  1C. Salvage therapy with FOLFOX was initiated 04/13/2011.  1D. A restaging CT 06/28/2011 revealed a significant improvement in the hepatic metastasis.  1E. She completed eight cycles of FOLFOX chemotherapy. Treatment was switched to 5FU-leucovorin as per the FOLFOX regimen beginning with cycle #9 due to an allergic reaction (skin rash) following cycle #8.  59F. Restaging CT 10/25/2011 revealed 2 more conspicuous liver metastases .  1G. Restaging PET scan 12/24/2011 revealed an increase in the size and metabolic activity of 3 liver metastases  1H. She began FOLFIRI chemotherapy on 01/19/2012.  1 I. Restaging CT of the abdomen 05/08/2012 revealed a mixed response with enlargement of 2 liver lesions and a decrease in the size of a third lesion, no new lesions.  1J. FOLFIRI chemotherapy continued with the addition of Avastin beginning 05/09/2012.  1K. restaging CT 07/28/2012 with no new liver lesions and a decreased size of the previously noted 3 liver lesions.  1L. She was last treated with FOLFIRI/Avastin on 09/12/2012.  58M. She completed stereotactic radiotherapy to 3 liver metastases 10/10/2012 through 10/20/2012.  1N. Follow-up MRI of the abdomen 11/24/2012 showed improvement in the 3 hepatic metastases and no progressive disease.  1O. Restaging MRI 04/09/2013 showed improvement in the 3 treated hepatic lesions and 2 new lesions. No evidence of metastatic disease outside of liver.  1P.  Initiation of Xeloda/Avastin 05/30/2013. Cycle 2 initiated on 06/20/2013 (Avastin placed on hold)-Xeloda discontinued after 10  days secondary to hand/foot syndrome. Cycle 3 Xeloda question initiated on 07/28/2013 (she is unable to recall when she started or stopped cycle 3). Cycle 4 08/20/2013. Cycle 5 09/14/2013 (no Xeloda). Cycle 6 Xeloda/Avastin 10/01/2013. Cycle 7 Xeloda/Avastin 11/02/2013  1Q. restaging MRI of the liver 11/16/2013-3 enhancing right liver lesion, slightly larger. No evidence of metastasis in the left hepatic lobe.  1R. Xeloda/Avastin continued.  1S. restaging PET scan 01/28/2014 showed 2 hypermetabolic lesions in the liver corresponding to the abnormality seen on the recent MRI. The third lesion seen on the recent MRI was not discernible on the PET. No other evidence for hypermetabolic metastases.  [name] continued.  1U. CT abdomen/pelvis 06/14/2014 with new moderate ascites; large infiltrating lesions again noted within the right hepatic lobe difficult to fully assess on CT. Mildly increased hepatic atrophy with regard to the 2 larger lesions. A smaller lobulated lesion had increased mildly in size. 1V. CT chest/abdomen 09/09/2014 with progression of multifocal hepatic metastases. No evidence of metastatic disease in the chest. 1W. Initiation of 5-fluorouracil/Panitumumab on a 2 week schedule 09/10/2014. 1X. CT 01/20/2015 with likely mild improvement in multifocal hepatic metastases. 1Y. Continuation of 5-FU/Panitumumab on a 3 week schedule  1Z.   No evidence for metastatic disease in the chest. Multifocal liver metastases without substantial change. Clear and marked interval progression of ascites. Interval development/worsening of body wall edema. Stable borderline para-aortic lymphadenopathy.  1. Diabetes. 2. History of proteinuria secondary to Avastin versus diabetes. 3. Mucositis and hand foot syndrome, secondary to 5FU improved with a dose reduction of the 5-FU. 4. 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 Left back and left leg pain with an MRI of the  lumbar spine confirming lumbar disk disease. She is status post surgery by Dr. Christella Noa 02/14/2008 with resolution of the back and left leg pain. 6. Rosacea. She is followed by dermatology.  7. Depression, maintained on Cymbalta 8. History of bilateral knee pain secondary to arthritis -- she takes Vicodin as needed. 9. History of anorexia -- weight loss -- ?Related to depression/anxiety or metastatic colon cancer. 10. Allergic reaction to oxaliplatin with FOLFOX on 04/13/2011. She was able to tolerate FOLFOX on 04/20/2011 with steroid premedication and a prolonged oxaliplatin infusion. After cycle #8 she developed a rash upon returning home with pruritus. She took Benadryl with resolution of symptoms. This reaction occurred despite steroid premedication. 11. Poor dentition -- she was evaluated by Dr. Enrique Sack. She underwent tooth extractions 07/04/2013. She now has dentures.  12. Neutropenia following cycle 1 of FOLFIRI. The FOLFIRI was dose reduced beginning with cycle 2. She was neutropenic 03/14/2012 and chemotherapy was held for one week.  13. Nausea and diarrhea during the irinotecan infusion on 03/21/2012. Symptoms resolved with atropine. She received atropine as a premedication with each cycle. 14. Diarrhea following the 05/09/2012 chemotherapy cycle. Likely related to irinotecan. She takes Imodium as needed. 15. History of Diffuse pain 4-5 days after chemotherapy 07/17/2012-likely toxicity from Neulasta. 16. History of a fall with fracture of the right fifth metacarpal. 17. Hand/foot syndrome secondary to Xeloda-the Xeloda was dose reduced beginning with cycle 3 on 07/23/2013 (?). 18. Hypomagnesemia secondary to Panitumumab. 19. Anemia, multifactorial likely secondary to renal insufficiency, chronic disease and chemotherapy. Trial of weekly Procrit initiated 02/12/2015. 03/26/2015 hemoglobin unchanged and Procrit discontinued. 20. Ascites-status post a therapeutic paracentesis 03/28/2015 and  07/10/2015, negative cytology  Disposition: Ms. Schum appears unchanged. The recent restaging CT were again reviewed which showed stable disease in the liver. There was marked interval progression of ascites. S/P paracentesis on 07/10/2015 without evidence of cancer noted in the fluid removed. Chemotherapy to remain on hold. Will consider resuming chemotherapy if the patient becomes symptomatic from her cancer. Will see her back for follow-up on 08/12/15 as she is traveling beginning on 08/14/15 to see if she needs a repeat paracentesis.   Patient discussed with Dr. Benay Spice.  Mikey Bussing DNP, AGPCNP-BC, AOCNP   07/21/2015  2:44 PM

## 2015-07-22 ENCOUNTER — Telehealth: Payer: Self-pay | Admitting: Oncology

## 2015-07-22 LAB — CEA: CEA: 32.8 ng/mL — ABNORMAL HIGH (ref 0.0–5.0)

## 2015-07-22 NOTE — Telephone Encounter (Signed)
Discussed case in detail with daughter Romona

## 2015-08-03 ENCOUNTER — Other Ambulatory Visit: Payer: Self-pay | Admitting: Oncology

## 2015-08-12 ENCOUNTER — Telehealth: Payer: Self-pay | Admitting: Nurse Practitioner

## 2015-08-12 ENCOUNTER — Ambulatory Visit (HOSPITAL_BASED_OUTPATIENT_CLINIC_OR_DEPARTMENT_OTHER): Payer: PPO | Admitting: Nurse Practitioner

## 2015-08-12 ENCOUNTER — Other Ambulatory Visit (HOSPITAL_BASED_OUTPATIENT_CLINIC_OR_DEPARTMENT_OTHER): Payer: PPO

## 2015-08-12 VITALS — BP 114/60 | HR 83 | Temp 98.2°F | Resp 18 | Ht 61.0 in | Wt 175.3 lb

## 2015-08-12 DIAGNOSIS — E119 Type 2 diabetes mellitus without complications: Secondary | ICD-10-CM

## 2015-08-12 DIAGNOSIS — C188 Malignant neoplasm of overlapping sites of colon: Secondary | ICD-10-CM

## 2015-08-12 DIAGNOSIS — R188 Other ascites: Secondary | ICD-10-CM | POA: Diagnosis not present

## 2015-08-12 DIAGNOSIS — C787 Secondary malignant neoplasm of liver and intrahepatic bile duct: Secondary | ICD-10-CM

## 2015-08-12 DIAGNOSIS — D649 Anemia, unspecified: Secondary | ICD-10-CM | POA: Diagnosis not present

## 2015-08-12 DIAGNOSIS — C189 Malignant neoplasm of colon, unspecified: Secondary | ICD-10-CM

## 2015-08-12 LAB — CBC WITH DIFFERENTIAL/PLATELET
BASO%: 1 % (ref 0.0–2.0)
BASOS ABS: 0.1 10*3/uL (ref 0.0–0.1)
EOS%: 3.6 % (ref 0.0–7.0)
Eosinophils Absolute: 0.3 10*3/uL (ref 0.0–0.5)
HCT: 27.2 % — ABNORMAL LOW (ref 34.8–46.6)
HGB: 9.1 g/dL — ABNORMAL LOW (ref 11.6–15.9)
LYMPH%: 28.2 % (ref 14.0–49.7)
MCH: 34.1 pg — AB (ref 25.1–34.0)
MCHC: 33.3 g/dL (ref 31.5–36.0)
MCV: 102.4 fL — AB (ref 79.5–101.0)
MONO#: 0.7 10*3/uL (ref 0.1–0.9)
MONO%: 7.7 % (ref 0.0–14.0)
NEUT#: 5.3 10*3/uL (ref 1.5–6.5)
NEUT%: 59.5 % (ref 38.4–76.8)
Platelets: 300 10*3/uL (ref 145–400)
RBC: 2.66 10*6/uL — ABNORMAL LOW (ref 3.70–5.45)
RDW: 23.1 % — ABNORMAL HIGH (ref 11.2–14.5)
WBC: 8.9 10*3/uL (ref 3.9–10.3)
lymph#: 2.5 10*3/uL (ref 0.9–3.3)

## 2015-08-12 LAB — COMPREHENSIVE METABOLIC PANEL (CC13)
ALT: 39 U/L (ref 0–55)
AST: 82 U/L — ABNORMAL HIGH (ref 5–34)
Albumin: 2.1 g/dL — ABNORMAL LOW (ref 3.5–5.0)
Alkaline Phosphatase: 691 U/L — ABNORMAL HIGH (ref 40–150)
Anion Gap: 11 mEq/L (ref 3–11)
BUN: 29.6 mg/dL — AB (ref 7.0–26.0)
CHLORIDE: 105 meq/L (ref 98–109)
CO2: 24 mEq/L (ref 22–29)
Calcium: 8.6 mg/dL (ref 8.4–10.4)
Creatinine: 1.9 mg/dL — ABNORMAL HIGH (ref 0.6–1.1)
EGFR: 25 mL/min/{1.73_m2} — AB (ref >90)
Glucose: 135 mg/dl (ref 70–140)
POTASSIUM: 3.4 meq/L — AB (ref 3.5–5.1)
SODIUM: 140 meq/L (ref 136–145)
Total Bilirubin: 1.5 mg/dL — ABNORMAL HIGH (ref 0.20–1.20)
Total Protein: 6 g/dL — ABNORMAL LOW (ref 6.4–8.3)

## 2015-08-12 LAB — HOLD TUBE, BLOOD BANK

## 2015-08-12 LAB — MAGNESIUM (CC13): MAGNESIUM: 1.2 mg/dL — AB (ref 1.5–2.5)

## 2015-08-12 NOTE — Telephone Encounter (Signed)
Pt confirmed labs/ov per 09/13 POF, gave pt AVS and Calendar.... KJ, called central scheduling, scheduled pt for US Paracentesis with Peggy and sent msg to NP/LT to make order STAT since this was for today or tomorrow.

## 2015-08-12 NOTE — Progress Notes (Signed)
[facility] OFFICE PROGRESS NOTE   Diagnosis:  Colon cancer  INTERVAL HISTORY:   Ms. Gortney returns as scheduled. She notes increased leg edema and abdominal distention. She would like to be scheduled for a paracentesis prior to leaving on her trip in 2 days. She denies nausea/vomiting. She has a good appetite. She denies pain. Bowels moving regularly.  Objective:  Vital signs in last 24 hours:  Blood pressure 114/60, pulse 83, temperature 98.2 F (36.8 C), temperature source Oral, resp. rate 18, height 5' 1"  (1.549 m), weight 175 lb 4.8 oz (79.516 kg), SpO2 100 %.    HEENT: No thrush or ulcers. Resp: Lungs clear bilaterally. Cardio: Regular rate and rhythm. GI: Distended consistent with ascites. No hepatomegaly. Vascular: Pitting edema below the knees bilaterally. Port-A-Cath without erythema.   Lab Results:  Lab Results  Component Value Date   WBC 8.9 08/12/2015   HGB 9.1* 08/12/2015   HCT 27.2* 08/12/2015   MCV 102.4* 08/12/2015   PLT 300 08/12/2015   NEUTROABS 5.3 08/12/2015    Imaging:  No results found.  Medications: I have reviewed the patient's current medications.  Assessment/Plan: 1. Metastatic colon cancer presenting with synchronous colon primaries in September, 2008, status post a hemicolectomy and liver biopsy 08/03/2007. A CT on 04/20/2010 revealed no evidence metastatic disease. She was maintained on 5-FU/leucovorin per the FOLFOX regimen beginning in April, 2009. Avasta was discontinued from the chemotherapy regimen beginning in November, 2009 due to nephrotic range proteinuria. A CT of the abdomen and pelvis 02/05/2011 revealed evidence of three small liver lesions. An MRI of the abdomen on 03/02/2011 confirmed 3 liver lesions and no additional evidence of metastatic disease. K-RAS testing on the transverse colon and left colon tumors revealed a wild type genotype. Foundation 1 testing revealed no K-ras, NRAS, or BRAF mutation. 1A.  Staging PET scan 03/21/2011 confirmed 3 hypermetabolic liver lesions and no additional evidence of metastatic disease.  1B. Surgical consultation by Dr. Barry Dienes was obtained and Ms. Stebbins decided against an attempt at surgical resection/radiofrequency ablation.  1C. Salvage therapy with FOLFOX was initiated 04/13/2011.  1D. A restaging CT 06/28/2011 revealed a significant improvement in the hepatic metastasis.  1E. She completed eight cycles of FOLFOX chemotherapy. Treatment was switched to 5FU-leucovorin as per the FOLFOX regimen beginning with cycle #9 due to an allergic reaction (skin rash) following cycle #8.  69F. Restaging CT 10/25/2011 revealed 2 more conspicuous liver metastases .  1G. Restaging PET scan 12/24/2011 revealed an increase in the size and metabolic activity of 3 liver metastases  1H. She began FOLFIRI chemotherapy on 01/19/2012.  1 I. Restaging CT of the abdomen 05/08/2012 revealed a mixed response with enlargement of 2 liver lesions and a decrease in the size of a third lesion, no new lesions.  1J. FOLFIRI chemotherapy continued with the addition of Avastin beginning 05/09/2012.  1K. restaging CT 07/28/2012 with no new liver lesions and a decreased size of the previously noted 3 liver lesions.  1L. She was last treated with FOLFIRI/Avastin on 09/12/2012.  79M. She completed stereotactic radiotherapy to 3 liver metastases 10/10/2012 through 10/20/2012.  1N. Follow-up MRI of the abdomen 11/24/2012 showed improvement in the 3 hepatic metastases and no progressive disease.  1O. Restaging MRI 04/09/2013 showed improvement in the 3 treated hepatic lesions and 2 new lesions. No evidence of metastatic disease outside of liver.  1P. Initiation of Xeloda/Avastin 05/30/2013. Cycle 2 initiated on 06/20/2013 (Avastin placed on hold)-Xeloda discontinued after 10 days secondary to  hand/foot syndrome. Cycle 3 Xeloda question initiated on 07/28/2013 (she is unable to recall when she  started or stopped cycle 3). Cycle 4 08/20/2013. Cycle 5 09/14/2013 (no Xeloda). Cycle 6 Xeloda/Avastin 10/01/2013. Cycle 7 Xeloda/Avastin 11/02/2013  1Q. restaging MRI of the liver 11/16/2013-3 enhancing right liver lesion, slightly larger. No evidence of metastasis in the left hepatic lobe.  1R. Xeloda/Avastin continued.  1S. restaging PET scan 01/28/2014 showed 2 hypermetabolic lesions in the liver corresponding to the abnormality seen on the recent MRI. The third lesion seen on the recent MRI was not discernible on the PET. No other evidence for hypermetabolic metastases.  [name] continued.  1U. CT abdomen/pelvis 06/14/2014 with new moderate ascites; large infiltrating lesions again noted within the right hepatic lobe difficult to fully assess on CT. Mildly increased hepatic atrophy with regard to the 2 larger lesions. A smaller lobulated lesion had increased mildly in size. 1V. CT chest/abdomen 09/09/2014 with progression of multifocal hepatic metastases. No evidence of metastatic disease in the chest. 1W. Initiation of 5-fluorouracil/Panitumumab on a 2 week schedule 09/10/2014. 1X. CT 01/20/2015 with likely mild improvement in multifocal hepatic metastases. 1Y. Continuation of 5-FU/Panitumumab on a 3 week schedule  1Z.CT 07/07/2015. No evidence for metastatic disease in the chest. Multifocal liver metastases without substantial change. Clear and marked interval progression of ascites. Interval development/worsening of body wall edema. Stable borderline para-aortic lymphadenopathy.  1. Diabetes. 2. History of proteinuria secondary to Avastin versus diabetes. 3. Mucositis and hand foot syndrome, secondary to 5FU improved with a dose reduction of the 5-FU. 4. 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 Left back and left leg pain with an MRI of the lumbar spine confirming lumbar disk disease. She is status post surgery by Dr. Christella Noa 02/14/2008 with  resolution of the back and left leg pain. 6. Rosacea. She is followed by dermatology.  7. Depression, maintained on Cymbalta 8. History of bilateral knee pain secondary to arthritis -- she takes Vicodin as needed. 9. History of anorexia -- weight loss -- ?Related to depression/anxiety or metastatic colon cancer. 10. Allergic reaction to oxaliplatin with FOLFOX on 04/13/2011. She was able to tolerate FOLFOX on 04/20/2011 with steroid premedication and a prolonged oxaliplatin infusion. After cycle #8 she developed a rash upon returning home with pruritus. She took Benadryl with resolution of symptoms. This reaction occurred despite steroid premedication. 11. Poor dentition -- she was evaluated by Dr. Enrique Sack. She underwent tooth extractions 07/04/2013. She now has dentures.  12. Neutropenia following cycle 1 of FOLFIRI. The FOLFIRI was dose reduced beginning with cycle 2. She was neutropenic 03/14/2012 and chemotherapy was held for one week.  13. Nausea and diarrhea during the irinotecan infusion on 03/21/2012. Symptoms resolved with atropine. She received atropine as a premedication with each cycle. 14. Diarrhea following the 05/09/2012 chemotherapy cycle. Likely related to irinotecan. She takes Imodium as needed. 15. History of Diffuse pain 4-5 days after chemotherapy 07/17/2012-likely toxicity from Neulasta. 16. History of a fall with fracture of the right fifth metacarpal. 17. Hand/foot syndrome secondary to Xeloda-the Xeloda was dose reduced beginning with cycle 3 on 07/23/2013 (?). 18. Hypomagnesemia secondary to Panitumumab. 19. Anemia, multifactorial likely secondary to renal insufficiency, chronic disease and chemotherapy. Trial of weekly Procrit initiated 02/12/2015. 03/26/2015 hemoglobin unchanged and Procrit discontinued. 20. Ascites-status post a therapeutic paracentesis 03/28/2015 and 07/10/2015, negative cytology   Disposition: Ms. Apps appears to have recurrent ascites. She is  symptomatic with discomfort and would like to have a paracentesis. We will  try to get this scheduled in the next one to 2 days. She has had 2 paracentesis procedures in the past both with negative cytology. We will request fluid to be sent for cytology this time as well.  She is leaving to go out of town in 2 days. She will return to [place] on 08/23/2015. We will see her in follow-up on 08/27/2015. She will contact the office in the interim with any problems.    Ned Card ANP/GNP-BC   08/12/2015  11:21 AM

## 2015-08-13 ENCOUNTER — Ambulatory Visit (HOSPITAL_COMMUNITY)
Admission: RE | Admit: 2015-08-13 | Discharge: 2015-08-13 | Disposition: A | Discharge status: Home / Self Care | Payer: PPO | Source: Ambulatory Visit | Attending: Nurse Practitioner | Admitting: Nurse Practitioner

## 2015-08-13 DIAGNOSIS — R188 Other ascites: Secondary | ICD-10-CM | POA: Diagnosis not present

## 2015-08-13 DIAGNOSIS — C189 Malignant neoplasm of colon, unspecified: Secondary | ICD-10-CM | POA: Diagnosis not present

## 2015-08-13 DIAGNOSIS — C787 Secondary malignant neoplasm of liver and intrahepatic bile duct: Secondary | ICD-10-CM | POA: Diagnosis not present

## 2015-08-13 NOTE — Procedures (Signed)
Ultrasound-guided diagnostic and therapeutic paracentesis performed yielding 5 L (maximum ordered) of yellow fluid. A portion of the fluid was sent to the laboratory for cytology. No immediate complications.

## 2015-08-27 ENCOUNTER — Other Ambulatory Visit (HOSPITAL_BASED_OUTPATIENT_CLINIC_OR_DEPARTMENT_OTHER): Payer: PPO

## 2015-08-27 ENCOUNTER — Telehealth: Payer: Self-pay | Admitting: Nurse Practitioner

## 2015-08-27 ENCOUNTER — Ambulatory Visit (HOSPITAL_BASED_OUTPATIENT_CLINIC_OR_DEPARTMENT_OTHER): Payer: PPO | Admitting: Nurse Practitioner

## 2015-08-27 VITALS — BP 145/66 | HR 81 | Temp 98.2°F | Resp 18 | Ht 61.0 in | Wt 181.0 lb

## 2015-08-27 DIAGNOSIS — C189 Malignant neoplasm of colon, unspecified: Secondary | ICD-10-CM

## 2015-08-27 DIAGNOSIS — C787 Secondary malignant neoplasm of liver and intrahepatic bile duct: Secondary | ICD-10-CM

## 2015-08-27 LAB — COMPREHENSIVE METABOLIC PANEL (CC13)
ALK PHOS: 721 U/L — AB (ref 40–150)
ALT: 21 U/L (ref 0–55)
ANION GAP: 10 meq/L (ref 3–11)
AST: 45 U/L — ABNORMAL HIGH (ref 5–34)
Albumin: 1.9 g/dL — ABNORMAL LOW (ref 3.5–5.0)
BUN: 37.1 mg/dL — ABNORMAL HIGH (ref 7.0–26.0)
CALCIUM: 8.5 mg/dL (ref 8.4–10.4)
CO2: 23 mEq/L (ref 22–29)
CREATININE: 2.2 mg/dL — AB (ref 0.6–1.1)
Chloride: 104 mEq/L (ref 98–109)
EGFR: 21 mL/min/{1.73_m2} — ABNORMAL LOW (ref >90)
Glucose: 119 mg/dl (ref 70–140)
POTASSIUM: 4.3 meq/L (ref 3.5–5.1)
Sodium: 138 mEq/L (ref 136–145)
Total Bilirubin: 1.38 mg/dL — ABNORMAL HIGH (ref 0.20–1.20)
Total Protein: 6 g/dL — ABNORMAL LOW (ref 6.4–8.3)

## 2015-08-27 LAB — CBC WITH DIFFERENTIAL/PLATELET
BASO%: 1.2 % (ref 0.0–2.0)
BASOS ABS: 0.1 10*3/uL (ref 0.0–0.1)
EOS%: 3.7 % (ref 0.0–7.0)
Eosinophils Absolute: 0.3 10*3/uL (ref 0.0–0.5)
HEMATOCRIT: 25.6 % — AB (ref 34.8–46.6)
HGB: 8.5 g/dL — ABNORMAL LOW (ref 11.6–15.9)
LYMPH#: 2.3 10*3/uL (ref 0.9–3.3)
LYMPH%: 27.4 % (ref 14.0–49.7)
MCH: 34.1 pg — AB (ref 25.1–34.0)
MCHC: 33 g/dL (ref 31.5–36.0)
MCV: 103.3 fL — ABNORMAL HIGH (ref 79.5–101.0)
MONO#: 0.6 10*3/uL (ref 0.1–0.9)
MONO%: 7.2 % (ref 0.0–14.0)
NEUT#: 5.1 10*3/uL (ref 1.5–6.5)
NEUT%: 60.5 % (ref 38.4–76.8)
PLATELETS: 331 10*3/uL (ref 145–400)
RBC: 2.48 10*6/uL — ABNORMAL LOW (ref 3.70–5.45)
RDW: 21.2 % — ABNORMAL HIGH (ref 11.2–14.5)
WBC: 8.3 10*3/uL (ref 3.9–10.3)

## 2015-08-27 NOTE — Progress Notes (Signed)
Emily Lawson OFFICE PROGRESS NOTE   Diagnosis:  Colon cancer  INTERVAL HISTORY:   Emily Lawson returns as scheduled. She underwent a paracentesis on 08/13/2015. She noted improvement in the abdominal distention. She does not feel she needs a paracentesis at present. She continues to have leg swelling. The edema improves overnight. She has a good appetite. She denies pain. Bowels are moving regularly. She reports falling while out-of-town. She hit her head and required stitches.  Objective:  Vital signs in last 24 hours:  Blood pressure 145/66, pulse 81, temperature 98.2 F (36.8 C), temperature source Oral, resp. rate 18, height 5' 1" (1.549 m), weight 181 lb (82.101 kg), SpO2 100 %.    HEENT: No thrush or ulcers. Resp: Lungs clear bilaterally. Cardio: Regular rate and rhythm. GI: Abdomen markedly distended. She appears to have ascites. Vascular: Pitting bilateral leg edema. Port-A-Cath without erythema.    Lab Results:  Lab Results  Component Value Date   WBC 8.3 08/27/2015   HGB 8.5* 08/27/2015   HCT 25.6* 08/27/2015   MCV 103.3* 08/27/2015   PLT 331 08/27/2015   NEUTROABS 5.1 08/27/2015   Creatinine 2.2, BUN 37, bilirubin 1.38, alkaline phosphatase 721, SGOT 45, SGPT 21, albumin 1.9  Imaging:  No results found.  Medications: I have reviewed the patient's current medications.  Assessment/Plan: 1. Metastatic colon cancer presenting with synchronous colon primaries in September, 2008, status post a hemicolectomy and liver biopsy 08/03/2007. A CT on 04/20/2010 revealed no evidence metastatic disease. She was maintained on 5-FU/leucovorin per the FOLFOX regimen beginning in April, 2009. Avasta was discontinued from the chemotherapy regimen beginning in November, 2009 due to nephrotic range proteinuria. A CT of the abdomen and pelvis 02/05/2011 revealed evidence of three small liver lesions. An MRI of the abdomen on 03/02/2011 confirmed 3 liver lesions and no  additional evidence of metastatic disease. K-RAS testing on the transverse colon and left colon tumors revealed a wild type genotype. Foundation 1 testing revealed no K-ras, NRAS, or BRAF mutation. 1A. Staging PET scan 03/21/2011 confirmed 3 hypermetabolic liver lesions and no additional evidence of metastatic disease.  1B. Surgical consultation by Dr. Barry Dienes was obtained and Emily Lawson decided against an attempt at surgical resection/radiofrequency ablation.  1C. Salvage therapy with FOLFOX was initiated 04/13/2011.  1D. A restaging CT 06/28/2011 revealed a significant improvement in the hepatic metastasis.  1E. She completed eight cycles of FOLFOX chemotherapy. Treatment was switched to 5FU-leucovorin as per the FOLFOX regimen beginning with cycle #9 due to an allergic reaction (skin rash) following cycle #8.  74F. Restaging CT 10/25/2011 revealed 2 more conspicuous liver metastases .  1G. Restaging PET scan 12/24/2011 revealed an increase in the size and metabolic activity of 3 liver metastases  1H. She began FOLFIRI chemotherapy on 01/19/2012.  1 I. Restaging CT of the abdomen 05/08/2012 revealed a mixed response with enlargement of 2 liver lesions and a decrease in the size of a third lesion, no new lesions.  1J. FOLFIRI chemotherapy continued with the addition of Avastin beginning 05/09/2012.  1K. restaging CT 07/28/2012 with no new liver lesions and a decreased size of the previously noted 3 liver lesions.  1L. She was last treated with FOLFIRI/Avastin on 09/12/2012.  60M. She completed stereotactic radiotherapy to 3 liver metastases 10/10/2012 through 10/20/2012.  1N. Follow-up MRI of the abdomen 11/24/2012 showed improvement in the 3 hepatic metastases and no progressive disease.  1O. Restaging MRI 04/09/2013 showed improvement in the 3 treated hepatic lesions and 2  new lesions. No evidence of metastatic disease outside of liver.  1P. Initiation of Xeloda/Avastin 05/30/2013.  Cycle 2 initiated on 06/20/2013 (Avastin placed on hold)-Xeloda discontinued after 10 days secondary to hand/foot syndrome. Cycle 3 Xeloda question initiated on 07/28/2013 (she is unable to recall when she started or stopped cycle 3). Cycle 4 08/20/2013. Cycle 5 09/14/2013 (no Xeloda). Cycle 6 Xeloda/Avastin 10/01/2013. Cycle 7 Xeloda/Avastin 11/02/2013  1Q. restaging MRI of the liver 11/16/2013-3 enhancing right liver lesion, slightly larger. No evidence of metastasis in the left hepatic lobe.  1R. Xeloda/Avastin continued.  1S. restaging PET scan 01/28/2014 showed 2 hypermetabolic lesions in the liver corresponding to the abnormality seen on the recent MRI. The third lesion seen on the recent MRI was not discernible on the PET. No other evidence for hypermetabolic metastases.  1T. Xeloda/Avastin continued.  1U. CT abdomen/pelvis 06/14/2014 with new moderate ascites; large infiltrating lesions again noted within the right hepatic lobe difficult to fully assess on CT. Mildly increased hepatic atrophy with regard to the 2 larger lesions. A smaller lobulated lesion had increased mildly in size. 1V. CT chest/abdomen 09/09/2014 with progression of multifocal hepatic metastases. No evidence of metastatic disease in the chest. 1W. Initiation of 5-fluorouracil/Panitumumab on a 2 week schedule 09/10/2014. 1X. CT 01/20/2015 with likely mild improvement in multifocal hepatic metastases. 1Y. Continuation of 5-FU/Panitumumab on a 3 week schedule  1Z.CT 07/07/2015. No evidence for metastatic disease in the chest. Multifocal liver metastases without substantial change. Clear and marked interval progression of ascites. Interval development/worsening of body wall edema. Stable borderline para-aortic lymphadenopathy.  1. Diabetes. 2. History of proteinuria secondary to Avastin versus diabetes. 3. Mucositis and hand foot syndrome, secondary to 5FU improved with a dose reduction of the 5-FU. 4. Anemia  secondary to chemotherapy and renal insufficiency  5. Left back and left leg pain with an MRI of the lumbar spine confirming lumbar disk disease. She is status post surgery by Dr. Cabbell 02/14/2008 with resolution of the back and left leg pain. 6. Rosacea. She is followed by dermatology.  7. Depression, maintained on Cymbalta 8. History of bilateral knee pain secondary to arthritis -- she takes Vicodin as needed. 9. History of anorexia -- weight loss -- ?Related to depression/anxiety or metastatic colon cancer. 10. Allergic reaction to oxaliplatin with FOLFOX on 04/13/2011. She was able to tolerate FOLFOX on 04/20/2011 with steroid premedication and a prolonged oxaliplatin infusion. After cycle #8 she developed a rash upon returning home with pruritus. She took Benadryl with resolution of symptoms. This reaction occurred despite steroid premedication. 11. Poor dentition -- she was evaluated by Dr. Kulinski. She underwent tooth extractions 07/04/2013. She now has dentures.  12. Neutropenia following cycle 1 of FOLFIRI. The FOLFIRI was dose reduced beginning with cycle 2. She was neutropenic 03/14/2012 and chemotherapy was held for one week.  13. Nausea and diarrhea during the irinotecan infusion on 03/21/2012. Symptoms resolved with atropine. She received atropine as a premedication with each cycle. 14. Diarrhea following the 05/09/2012 chemotherapy cycle. Likely related to irinotecan. She takes Imodium as needed. 15. History of Diffuse pain 4-5 days after chemotherapy 07/17/2012-likely toxicity from Neulasta. 16. History of a fall with fracture of the right fifth metacarpal. 17. Hand/foot syndrome secondary to Xeloda-the Xeloda was dose reduced beginning with cycle 3 on 07/23/2013 (?). 18. Hypomagnesemia secondary to Panitumumab. 19. Anemia, multifactorial likely secondary to renal insufficiency, chronic disease and chemotherapy. Trial of weekly Procrit initiated 02/12/2015. 03/26/2015 hemoglobin  unchanged and Procrit discontinued. 20. Ascites-status post a   therapeutic paracentesis 03/28/2015 and 07/10/2015, negative cytology; 08/13/2015, negative cytology.    Disposition: Emily Lawson appears unchanged. We will continue to follow with observation. The plan is to consider resuming chemotherapy if she becomes symptomatic as a result of progression of the cancer.  She has recurrent ascites of unknown etiology. Cytology has been negative 3. She does not feel she needs a paracentesis today. She would like for a paracentesis to be scheduled coinciding with her next office visit. She will contact the office if she desires a paracentesis sooner.  She will return for a follow-up visit in 3 weeks. She will contact the office in the interim as outlined above or with any other problems.  Patient seen with Dr. Sherrill.    Thomas, Lisa ANP/GNP-BC   08/27/2015  12:10 PM  This was a shared visit with Lisa Thomas. Emily Lawson was interviewed and examined. She has persistent ascites. I suspect she has chronic liver disease, potentially related to cancer and extensive treatment with chemotherapy.  Brad Sherrill, M.D.          

## 2015-08-27 NOTE — Telephone Encounter (Signed)
per pof to sch pt appt-sch and adv pt that Radiology will call to asch paracentesis-pt unerstood-gave copy of avs

## 2015-08-28 LAB — CEA: CEA: 133.4 ng/mL — AB (ref 0.0–5.0)

## 2015-09-01 ENCOUNTER — Telehealth: Payer: Self-pay | Admitting: *Deleted

## 2015-09-01 NOTE — Telephone Encounter (Signed)
Per Dr. Benay Spice; Ned Card, NP made aware to contact pt's daughter.

## 2015-09-01 NOTE — Telephone Encounter (Signed)
ROMONA Roller IS PT.'S POA. SHE WOULD LIKE A CALL FROM DR.SHERRILL TODAY.

## 2015-09-02 ENCOUNTER — Other Ambulatory Visit: Payer: Self-pay | Admitting: Oncology

## 2015-09-15 ENCOUNTER — Telehealth: Payer: Self-pay | Admitting: *Deleted

## 2015-09-15 NOTE — Telephone Encounter (Signed)
"  I have an appointment this week.  What is this for?"  Advised tomorrow's paracentesis is for ascites.  Wednesday she will see lab and Dr. Benay Spice.  No further questions.

## 2015-09-16 ENCOUNTER — Ambulatory Visit (HOSPITAL_COMMUNITY)
Admission: RE | Admit: 2015-09-16 | Discharge: 2015-09-16 | Disposition: A | Discharge status: Home / Self Care | Payer: PPO | Source: Ambulatory Visit | Attending: Nurse Practitioner | Admitting: Nurse Practitioner

## 2015-09-16 ENCOUNTER — Other Ambulatory Visit: Payer: Self-pay | Admitting: *Deleted

## 2015-09-16 DIAGNOSIS — R188 Other ascites: Secondary | ICD-10-CM | POA: Insufficient documentation

## 2015-09-16 DIAGNOSIS — C189 Malignant neoplasm of colon, unspecified: Secondary | ICD-10-CM | POA: Insufficient documentation

## 2015-09-16 NOTE — Procedures (Signed)
Ultrasound-guided diagnostic and therapeutic paracentesis performed yielding 5 L (maximum ordered) slightly hazy, yellow fluid. A portion of the fluid was submitted to the lab for cytology. No immediate complications.

## 2015-09-17 ENCOUNTER — Other Ambulatory Visit (HOSPITAL_COMMUNITY)
Admission: RE | Admit: 2015-09-17 | Discharge: 2015-09-17 | Disposition: A | Discharge status: Home / Self Care | Payer: PPO | Source: Ambulatory Visit | Attending: Oncology | Admitting: Oncology

## 2015-09-17 ENCOUNTER — Other Ambulatory Visit (HOSPITAL_BASED_OUTPATIENT_CLINIC_OR_DEPARTMENT_OTHER): Payer: PPO

## 2015-09-17 ENCOUNTER — Other Ambulatory Visit: Payer: Self-pay | Admitting: *Deleted

## 2015-09-17 ENCOUNTER — Telehealth: Payer: Self-pay | Admitting: Nurse Practitioner

## 2015-09-17 ENCOUNTER — Other Ambulatory Visit: Payer: PPO

## 2015-09-17 ENCOUNTER — Ambulatory Visit (HOSPITAL_BASED_OUTPATIENT_CLINIC_OR_DEPARTMENT_OTHER): Payer: PPO | Admitting: Nurse Practitioner

## 2015-09-17 ENCOUNTER — Encounter: Payer: Self-pay | Admitting: *Deleted

## 2015-09-17 ENCOUNTER — Telehealth: Payer: Self-pay | Admitting: Oncology

## 2015-09-17 ENCOUNTER — Other Ambulatory Visit: Payer: Self-pay | Admitting: Nurse Practitioner

## 2015-09-17 VITALS — BP 99/65 | HR 65 | Temp 98.9°F | Resp 17 | Ht 61.0 in | Wt 183.5 lb

## 2015-09-17 DIAGNOSIS — C189 Malignant neoplasm of colon, unspecified: Secondary | ICD-10-CM

## 2015-09-17 DIAGNOSIS — C188 Malignant neoplasm of overlapping sites of colon: Secondary | ICD-10-CM

## 2015-09-17 DIAGNOSIS — K7290 Hepatic failure, unspecified without coma: Secondary | ICD-10-CM

## 2015-09-17 DIAGNOSIS — R188 Other ascites: Secondary | ICD-10-CM | POA: Diagnosis not present

## 2015-09-17 DIAGNOSIS — N19 Unspecified kidney failure: Secondary | ICD-10-CM | POA: Diagnosis not present

## 2015-09-17 DIAGNOSIS — C787 Secondary malignant neoplasm of liver and intrahepatic bile duct: Secondary | ICD-10-CM

## 2015-09-17 LAB — CBC WITH DIFFERENTIAL/PLATELET
BASO%: 1.3 % (ref 0.0–2.0)
BASOS ABS: 0.1 10*3/uL (ref 0.0–0.1)
EOS%: 2.8 % (ref 0.0–7.0)
Eosinophils Absolute: 0.2 10*3/uL (ref 0.0–0.5)
HEMATOCRIT: 22.6 % — AB (ref 34.8–46.6)
HEMOGLOBIN: 7.4 g/dL — AB (ref 11.6–15.9)
LYMPH#: 1.3 10*3/uL (ref 0.9–3.3)
LYMPH%: 17.3 % (ref 14.0–49.7)
MCH: 35 pg — AB (ref 25.1–34.0)
MCHC: 32.8 g/dL (ref 31.5–36.0)
MCV: 106.7 fL — ABNORMAL HIGH (ref 79.5–101.0)
MONO#: 0.4 10*3/uL (ref 0.1–0.9)
MONO%: 5.5 % (ref 0.0–14.0)
NEUT%: 73.1 % (ref 38.4–76.8)
NEUTROS ABS: 5.5 10*3/uL (ref 1.5–6.5)
Platelets: 246 10*3/uL (ref 145–400)
RBC: 2.12 10*6/uL — ABNORMAL LOW (ref 3.70–5.45)
RDW: 19.1 % — AB (ref 11.2–14.5)
WBC: 7.6 10*3/uL (ref 3.9–10.3)

## 2015-09-17 LAB — COMPREHENSIVE METABOLIC PANEL (CC13)
ALBUMIN: 1.7 g/dL — AB (ref 3.5–5.0)
ALK PHOS: 615 U/L — AB (ref 40–150)
ALT: 17 U/L (ref 0–55)
AST: 39 U/L — AB (ref 5–34)
Anion Gap: 8 mEq/L (ref 3–11)
BILIRUBIN TOTAL: 1.24 mg/dL — AB (ref 0.20–1.20)
BUN: 39.1 mg/dL — AB (ref 7.0–26.0)
CALCIUM: 8.6 mg/dL (ref 8.4–10.4)
CO2: 23 mEq/L (ref 22–29)
CREATININE: 2.6 mg/dL — AB (ref 0.6–1.1)
Chloride: 108 mEq/L (ref 98–109)
EGFR: 17 mL/min/{1.73_m2} — ABNORMAL LOW (ref >90)
GLUCOSE: 114 mg/dL (ref 70–140)
POTASSIUM: 4.2 meq/L (ref 3.5–5.1)
SODIUM: 139 meq/L (ref 136–145)
TOTAL PROTEIN: 5.7 g/dL — AB (ref 6.4–8.3)

## 2015-09-17 LAB — AMMONIA: Ammonia: 78 umol/L — ABNORMAL HIGH (ref 9–35)

## 2015-09-17 NOTE — Telephone Encounter (Signed)
Gave adn printed appt sched and avs fo rpt for NOV °

## 2015-09-17 NOTE — Progress Notes (Signed)
Oncology Nurse Navigator Documentation  Oncology Nurse Navigator Flowsheets 09/17/2015  Navigator Encounter Type Follow up  Patient Visit Type Medonc  Treatment Phase Supportive care  Barriers/Navigation Needs No barriers at this time  Interventions None required--has home health aid  Time Spent with Patient 5  NP will call her daughter and explain her renal and liver issues and she is not well enough for any treatment. Needs to return to renal and GI to manage her cirrhosis. Faxed referral and records to [facility] [phone]

## 2015-09-17 NOTE — Telephone Encounter (Signed)
Received a call from [place] in reference to her mother Ms.Broussard inquiring about her office visit. Explained to her that Lattie Haw and Dr. Benay Spice are still seeing Patients and will return her call. Lisa aware.

## 2015-09-17 NOTE — Progress Notes (Addendum)
[place] OFFICE PROGRESS NOTE   Diagnosis:  Colon cancer  INTERVAL HISTORY:   Emily Lawson returns as scheduled. She underwent a paracentesis yesterday. She notes no significant change in abdominal distention. She vomited in the lobby this morning. No nausea or vomiting at home. No change in bowel habits. Appetite varies. She denies pain.  Objective:  Vital signs in last 24 hours:  Blood pressure 99/65, pulse 65, temperature 98.9 F (37.2 C), temperature source Oral, resp. rate 17, height 5' 1"  (1.549 m), weight 183 lb 8 oz (83.235 kg), SpO2 100 %.    HEENT: No thrush or ulcers. Lymphatics: No palpable cervical, supra clavicular or axillary lymph nodes. Resp: Lungs are clear. Breath sounds diminished at the right lower lung field. No respiratory distress. Cardio: Regular rate and rhythm. GI: Abdomen is distended. No hepatomegaly. Vascular: Pitting bilateral leg edema. Port-A-Cath without erythema.    Lab Results:  Lab Results  Component Value Date   WBC 7.6 09/17/2015   HGB 7.4* 09/17/2015   HCT 22.6* 09/17/2015   MCV 106.7* 09/17/2015   PLT 246 09/17/2015   NEUTROABS 5.5 09/17/2015    Imaging:  US Paracentesis  09/16/2015  INDICATION: Metastatic colon cancer, recurrent ascites. Request is made for diagnostic and therapeutic paracentesis up to 5 liters. EXAM: ULTRASOUND-GUIDED DIAGNOSTIC AND THERAPEUTIC PARACENTESIS COMPARISON:  Prior paracentesis on 08/13/2015 MEDICATIONS: None. COMPLICATIONS: None immediate TECHNIQUE: Informed written consent was obtained from the patient after a discussion of the risks, benefits and alternatives to treatment. A timeout was performed prior to the initiation of the procedure. Initial ultrasound scanning demonstrates a large amount of ascites within the left lower abdominal quadrant. The left lower abdomen was prepped and draped in the usual sterile fashion. 1% lidocaine was used for local anesthesia. Under direct ultrasound  guidance, a 19 gauge, 10-cm, Yueh catheter was introduced. An ultrasound image was saved for documentation purposed. The paracentesis was performed. The catheter was removed and a dressing was applied. The patient tolerated the procedure well without immediate post procedural complication. FINDINGS: A total of approximately 5 liters of slightly hazy, yellow fluid was removed. Samples were sent to the laboratory as requested by the clinical team. IMPRESSION: Successful ultrasound-guided diagnostic and therapeutic paracentesis yielding 5 liters of peritoneal fluid. Read by: Rowe Robert, PA-C Electronically Signed   By: Jacqulynn Cadet M.D.   On: 09/16/2015 12:05    Medications: I have reviewed the patient's current medications.  Assessment/Plan: 1. Metastatic colon cancer presenting with synchronous colon primaries in September, 2008, status post a hemicolectomy and liver biopsy 08/03/2007. A CT on 04/20/2010 revealed no evidence metastatic disease. She was maintained on 5-FU/leucovorin per the FOLFOX regimen beginning in April, 2009. Avasta was discontinued from the chemotherapy regimen beginning in November, 2009 due to nephrotic range proteinuria. A CT of the abdomen and pelvis 02/05/2011 revealed evidence of three small liver lesions. An MRI of the abdomen on 03/02/2011 confirmed 3 liver lesions and no additional evidence of metastatic disease. K-RAS testing on the transverse colon and left colon tumors revealed a wild type genotype. Foundation 1 testing revealed no K-ras, NRAS, or BRAF mutation. 1A. Staging PET scan 03/21/2011 confirmed 3 hypermetabolic liver lesions and no additional evidence of metastatic disease.  1B. Surgical consultation by Dr. Barry Dienes was obtained and Emily Lawson decided against an attempt at surgical resection/radiofrequency ablation.  1C. Salvage therapy with FOLFOX was initiated 04/13/2011.  1D. A restaging CT 06/28/2011 revealed a significant improvement in the hepatic  metastasis.  1E. She completed eight cycles of FOLFOX chemotherapy. Treatment was switched to 5FU-leucovorin as per the FOLFOX regimen beginning with cycle #9 due to an allergic reaction (skin rash) following cycle #8.  28F. Restaging CT 10/25/2011 revealed 2 more conspicuous liver metastases .  1G. Restaging PET scan 12/24/2011 revealed an increase in the size and metabolic activity of 3 liver metastases  1H. She began FOLFIRI chemotherapy on 01/19/2012.  1 I. Restaging CT of the abdomen 05/08/2012 revealed a mixed response with enlargement of 2 liver lesions and a decrease in the size of a third lesion, no new lesions.  1J. FOLFIRI chemotherapy continued with the addition of Avastin beginning 05/09/2012.  1K. restaging CT 07/28/2012 with no new liver lesions and a decreased size of the previously noted 3 liver lesions.  1L. She was last treated with FOLFIRI/Avastin on 09/12/2012.  19M. She completed stereotactic radiotherapy to 3 liver metastases 10/10/2012 through 10/20/2012.  1N. Follow-up MRI of the abdomen 11/24/2012 showed improvement in the 3 hepatic metastases and no progressive disease.  1O. Restaging MRI 04/09/2013 showed improvement in the 3 treated hepatic lesions and 2 new lesions. No evidence of metastatic disease outside of liver.  1P. Initiation of Xeloda/Avastin 05/30/2013. Cycle 2 initiated on 06/20/2013 (Avastin placed on hold)-Xeloda discontinued after 10 days secondary to hand/foot syndrome. Cycle 3 Xeloda question initiated on 07/28/2013 (she is unable to recall when she started or stopped cycle 3). Cycle 4 08/20/2013. Cycle 5 09/14/2013 (no Xeloda). Cycle 6 Xeloda/Avastin 10/01/2013. Cycle 7 Xeloda/Avastin 11/02/2013  1Q. restaging MRI of the liver 11/16/2013-3 enhancing right liver lesion, slightly larger. No evidence of metastasis in the left hepatic lobe.  1R. Xeloda/Avastin continued.  1S. restaging PET scan 01/28/2014 showed 2 hypermetabolic lesions in the  liver corresponding to the abnormality seen on the recent MRI. The third lesion seen on the recent MRI was not discernible on the PET. No other evidence for hypermetabolic metastases.  Emily Lawson continued.  1U. CT abdomen/pelvis 06/14/2014 with new moderate ascites; large infiltrating lesions again noted within the right hepatic lobe difficult to fully assess on CT. Mildly increased hepatic atrophy with regard to the 2 larger lesions. A smaller lobulated lesion had increased mildly in size. 1V. CT chest/abdomen 09/09/2014 with progression of multifocal hepatic metastases. No evidence of metastatic disease in the chest. 1W. Initiation of 5-fluorouracil/Panitumumab on a 2 week schedule 09/10/2014. 1X. CT 01/20/2015 with likely mild improvement in multifocal hepatic metastases. 1Y. Continuation of 5-FU/Panitumumab on a 3 week schedule  1Z.CT 07/07/2015. No evidence for metastatic disease in the chest. Multifocal liver metastases without substantial change. Clear and marked interval progression of ascites. Interval development/worsening of body wall edema. Stable borderline para-aortic lymphadenopathy.  1. Diabetes. 2. History of proteinuria secondary to Avastin versus diabetes. 3. Mucositis and hand foot syndrome, secondary to 5FU improved with a dose reduction of the 5-FU. 4. 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 Left back and left leg pain with an MRI of the lumbar spine confirming lumbar disk disease. She is status post surgery by Dr. Christella Noa 02/14/2008 with resolution of the back and left leg pain. 6. Rosacea. She is followed by dermatology.  7. Depression, maintained on Cymbalta 8. History of bilateral knee pain secondary to arthritis -- she takes Vicodin as needed. 9. History of anorexia -- weight loss -- ?Related to depression/anxiety or metastatic colon cancer. 10. Allergic reaction to oxaliplatin with FOLFOX on 04/13/2011. She was able to tolerate  FOLFOX on 04/20/2011 with steroid premedication  and a prolonged oxaliplatin infusion. After cycle #8 she developed a rash upon returning home with pruritus. She took Benadryl with resolution of symptoms. This reaction occurred despite steroid premedication. 11. Poor dentition -- she was evaluated by Dr. Enrique Sack. She underwent tooth extractions 07/04/2013. She now has dentures.  12. Neutropenia following cycle 1 of FOLFIRI. The FOLFIRI was dose reduced beginning with cycle 2. She was neutropenic 03/14/2012 and chemotherapy was held for one week.  13. Nausea and diarrhea during the irinotecan infusion on 03/21/2012. Symptoms resolved with atropine. She received atropine as a premedication with each cycle. 14. Diarrhea following the 05/09/2012 chemotherapy cycle. Likely related to irinotecan. She takes Imodium as needed. 15. History of diffuse pain 4-5 days after chemotherapy 07/17/2012-likely toxicity from Neulasta. 16. History of a fall with fracture of the right fifth metacarpal. 17. Hand/foot syndrome secondary to Xeloda-the Xeloda was dose reduced beginning with cycle 3 on 07/23/2013 (?). 18. Hypomagnesemia secondary to Panitumumab. 19. Anemia, multifactorial likely secondary to renal insufficiency, chronic disease and chemotherapy. Trial of weekly Procrit initiated 02/12/2015. 03/26/2015 hemoglobin unchanged and Procrit discontinued. 20. Ascites-status post a therapeutic paracentesis 03/28/2015 and 07/10/2015, negative cytology; 08/13/2015, negative cytology; 09/16/2015, negative cytology.   Disposition: Emily Lawson performance status is slowly declining. She has been off of specific therapy for colon cancer for several months. Most recent restaging CT evaluation in August of this year showed stable disease in the liver, progression of ascites. She has undergone multiple paracentesis procedures with negative cytology. She has progressive kidney and liver dysfunction. She is being followed by renal.  We made a referral to gastroenterology today for evaluation of refractory ascites, assistance with fluid status, question cirrhosis. In her current condition she is not a candidate for chemotherapy.  She will return for a follow-up visit in approximately 2 weeks.  Patient seen with Dr. Benay Spice.  Ned Card ANP/GNP-BC   09/17/2015  11:29 AM  This was a shared visit with Ned Card. Emily Lawson has developed renal and liver failure. I doubt this is directly related to the metastatic colon cancer, though potentially treatment related. She may have underlying cirrhosis. She is not a candidate for further systemic therapy. We will refer her to hepatology to evaluate for cirrhosis and recommend dieretic therapy. I will discuss a Hospice referral with Emily Lawson and her family if her condition is not improved when she returns in 2 weeks.  Julieanne Manson, M.D.

## 2015-09-29 ENCOUNTER — Telehealth: Payer: Self-pay

## 2015-09-29 ENCOUNTER — Other Ambulatory Visit: Payer: Self-pay | Admitting: Gastroenterology

## 2015-09-29 DIAGNOSIS — Z95828 Presence of other vascular implants and grafts: Secondary | ICD-10-CM

## 2015-09-29 DIAGNOSIS — C787 Secondary malignant neoplasm of liver and intrahepatic bile duct: Secondary | ICD-10-CM

## 2015-09-29 NOTE — Telephone Encounter (Signed)
Emily Lawson called asking for a return call b/c "there is some stuff going on" Returned call: Dr Benay Spice wanted to talk to Emily Lawson after he talked to Dr Oletta Lamas. This is about hospice. Mom is full of fluids, she looks bigger in her belly than ever before. Fallen a lot - 9 times in last 2 days. Confusion is getting worse. APPT NOT ON Wednesday B/C SHE DOES NOT HAVE ANYONE TO TAKE HER. Call Emily Lawson and she will convey it to the caregiver.

## 2015-09-29 NOTE — Addendum Note (Signed)
Addended by: Adalberto Cole on: 09/29/2015 03:00 PM   Modules accepted: Orders

## 2015-09-29 NOTE — Telephone Encounter (Signed)
Per Dr. Benay Spice, hospice options can be discussed at next appt on 11/8. May perform paracentesis. Order and POF sent to scheduler urgent. Ramona made aware of plan and voices understanding.

## 2015-10-02 ENCOUNTER — Ambulatory Visit
Admission: RE | Admit: 2015-10-02 | Discharge: 2015-10-02 | Disposition: A | Discharge status: Home / Self Care | Payer: PPO | Source: Ambulatory Visit | Attending: Gastroenterology | Admitting: Gastroenterology

## 2015-10-02 ENCOUNTER — Other Ambulatory Visit: Payer: Self-pay | Admitting: Oncology

## 2015-10-02 ENCOUNTER — Telehealth: Payer: Self-pay | Admitting: *Deleted

## 2015-10-02 DIAGNOSIS — Z95828 Presence of other vascular implants and grafts: Secondary | ICD-10-CM

## 2015-10-02 NOTE — Telephone Encounter (Signed)
Spoke with Romona, pt has been evaluated by IR. She will have a catheter placed to drain ascites at home. Per Romona, radiologist recommends holding off on paracentesis for now. Dr. Benay Spice made aware.

## 2015-10-03 ENCOUNTER — Other Ambulatory Visit: Payer: Self-pay | Admitting: Interventional Radiology

## 2015-10-03 ENCOUNTER — Ambulatory Visit (HOSPITAL_COMMUNITY): Payer: PPO

## 2015-10-03 ENCOUNTER — Telehealth: Payer: Self-pay | Admitting: *Deleted

## 2015-10-03 DIAGNOSIS — R188 Other ascites: Secondary | ICD-10-CM

## 2015-10-03 NOTE — Telephone Encounter (Signed)
Message from pt's daughter requesting PET scan. "She is due for one and I'm not sure if she'll be able to get one after she gets the catheter." Returned call, instructed her to bring pt in as scheduled 11/8 to discuss the need for imaging. She agrees to do so.

## 2015-10-07 ENCOUNTER — Ambulatory Visit (HOSPITAL_BASED_OUTPATIENT_CLINIC_OR_DEPARTMENT_OTHER): Payer: PPO

## 2015-10-07 ENCOUNTER — Other Ambulatory Visit: Payer: Self-pay | Admitting: *Deleted

## 2015-10-07 ENCOUNTER — Telehealth: Payer: Self-pay | Admitting: *Deleted

## 2015-10-07 ENCOUNTER — Telehealth: Payer: Self-pay

## 2015-10-07 ENCOUNTER — Other Ambulatory Visit (HOSPITAL_BASED_OUTPATIENT_CLINIC_OR_DEPARTMENT_OTHER): Payer: PPO

## 2015-10-07 ENCOUNTER — Ambulatory Visit (HOSPITAL_BASED_OUTPATIENT_CLINIC_OR_DEPARTMENT_OTHER): Payer: PPO | Admitting: Oncology

## 2015-10-07 VITALS — BP 147/49 | HR 75 | Temp 97.9°F | Resp 18 | Ht 61.0 in | Wt 203.4 lb

## 2015-10-07 DIAGNOSIS — C189 Malignant neoplasm of colon, unspecified: Secondary | ICD-10-CM

## 2015-10-07 DIAGNOSIS — R188 Other ascites: Secondary | ICD-10-CM

## 2015-10-07 DIAGNOSIS — C787 Secondary malignant neoplasm of liver and intrahepatic bile duct: Secondary | ICD-10-CM

## 2015-10-07 DIAGNOSIS — D649 Anemia, unspecified: Secondary | ICD-10-CM

## 2015-10-07 DIAGNOSIS — Z95828 Presence of other vascular implants and grafts: Secondary | ICD-10-CM

## 2015-10-07 DIAGNOSIS — N19 Unspecified kidney failure: Secondary | ICD-10-CM

## 2015-10-07 DIAGNOSIS — K7460 Unspecified cirrhosis of liver: Secondary | ICD-10-CM | POA: Diagnosis not present

## 2015-10-07 DIAGNOSIS — R970 Elevated carcinoembryonic antigen [CEA]: Secondary | ICD-10-CM

## 2015-10-07 LAB — CBC WITH DIFFERENTIAL/PLATELET
BASO%: 0.9 % (ref 0.0–2.0)
BASOS ABS: 0.1 10*3/uL (ref 0.0–0.1)
EOS%: 1.8 % (ref 0.0–7.0)
Eosinophils Absolute: 0.1 10*3/uL (ref 0.0–0.5)
HEMATOCRIT: 21.3 % — AB (ref 34.8–46.6)
HEMOGLOBIN: 6.9 g/dL — AB (ref 11.6–15.9)
LYMPH#: 1.2 10*3/uL (ref 0.9–3.3)
LYMPH%: 16.4 % (ref 14.0–49.7)
MCH: 34.3 pg — AB (ref 25.1–34.0)
MCHC: 32.3 g/dL (ref 31.5–36.0)
MCV: 106.4 fL — ABNORMAL HIGH (ref 79.5–101.0)
MONO#: 0.4 10*3/uL (ref 0.1–0.9)
MONO%: 5.9 % (ref 0.0–14.0)
NEUT#: 5.6 10*3/uL (ref 1.5–6.5)
NEUT%: 75 % (ref 38.4–76.8)
Platelets: 233 10*3/uL (ref 145–400)
RBC: 2.01 10*6/uL — ABNORMAL LOW (ref 3.70–5.45)
RDW: 19 % — AB (ref 11.2–14.5)
WBC: 7.4 10*3/uL (ref 3.9–10.3)

## 2015-10-07 LAB — COMPREHENSIVE METABOLIC PANEL (CC13)
ALBUMIN: 1.5 g/dL — AB (ref 3.5–5.0)
ALK PHOS: 548 U/L — AB (ref 40–150)
ALT: 15 U/L (ref 0–55)
AST: 38 U/L — AB (ref 5–34)
Anion Gap: 8 mEq/L (ref 3–11)
BILIRUBIN TOTAL: 0.96 mg/dL (ref 0.20–1.20)
BUN: 42 mg/dL — AB (ref 7.0–26.0)
CALCIUM: 8.2 mg/dL — AB (ref 8.4–10.4)
CO2: 23 mEq/L (ref 22–29)
CREATININE: 2.5 mg/dL — AB (ref 0.6–1.1)
Chloride: 106 mEq/L (ref 98–109)
EGFR: 18 mL/min/{1.73_m2} — ABNORMAL LOW (ref >90)
GLUCOSE: 161 mg/dL — AB (ref 70–140)
Potassium: 4.3 mEq/L (ref 3.5–5.1)
SODIUM: 138 meq/L (ref 136–145)
TOTAL PROTEIN: 5.5 g/dL — AB (ref 6.4–8.3)

## 2015-10-07 MED ORDER — HEPARIN SOD (PORK) LOCK FLUSH 100 UNIT/ML IV SOLN
500.0000 [IU] | Freq: Once | INTRAVENOUS | Status: AC
  Administered 2015-10-07: 500 [IU] via INTRAVENOUS
  Filled 2015-10-07: qty 5

## 2015-10-07 MED ORDER — HYDROCODONE-ACETAMINOPHEN 5-325 MG PO TABS: 1.0000 | ORAL_TABLET | Freq: Four times a day (QID) | ORAL | Status: DC | PRN

## 2015-10-07 MED ORDER — PROCHLORPERAZINE MALEATE 10 MG PO TABS: 10.0000 mg | ORAL_TABLET | Freq: Four times a day (QID) | ORAL | Status: DC | PRN

## 2015-10-07 MED ORDER — SODIUM CHLORIDE 0.9 % IJ SOLN
10.0000 mL | INTRAMUSCULAR | Status: DC | PRN
  Administered 2015-10-07: 10 mL via INTRAVENOUS
  Filled 2015-10-07: qty 10

## 2015-10-07 NOTE — Patient Instructions (Signed)

## 2015-10-07 NOTE — Progress Notes (Signed)
[place] OFFICE PROGRESS NOTE   Diagnosis: Colon cancer  INTERVAL HISTORY:   Emily Lawson returns for scheduled appointment. Her performance status continues to decline. She can ambulate a short distance with a walker. She presents today with a caretaker and her daughter. She has been evaluated by nephrology and gastroenterology. She has been diagnosed with cirrhosis and renal failure. She is scheduled for placement of a peritoneal drainage catheter on 10/10/2015.  She has pain at the knees.  Objective:  Vital signs in last 24 hours:  Blood pressure 147/49, pulse 75, temperature 97.9 F (36.6 C), temperature source Oral, resp. rate 18, height 5' 1"  (1.549 m), weight 203 lb 6.4 oz (92.262 kg), SpO2 100 %.    Resp: Decreased breath sounds and rales at the posterior  lower chest, no respiratory distress Cardio: Regular rate and rhythm GI: Distended Vascular: Pitting edema throughout both legs Neuro: Alert, follows commands, oriented     Portacath/PICC-without erythema  Lab Results:  Lab Results  Component Value Date   WBC 7.4 10/07/2015   HGB 6.9* 10/07/2015   HCT 21.3* 10/07/2015   MCV 106.4* 10/07/2015   PLT 233 10/07/2015   NEUTROABS 5.6 10/07/2015     Lab Results  Component Value Date   CEA 133.4* 08/27/2015    Medications: I have reviewed the patient's current medications.  Assessment/Plan: 1. Metastatic colon cancer presenting with synchronous colon primaries in September, 2008, status post a hemicolectomy and liver biopsy 08/03/2007. A CT on 04/20/2010 revealed no evidence metastatic disease. She was maintained on 5-FU/leucovorin per the FOLFOX regimen beginning in April, 2009. Avasta was discontinued from the chemotherapy regimen beginning in November, 2009 due to nephrotic range proteinuria. A CT of the abdomen and pelvis 02/05/2011 revealed evidence of three small liver lesions. An MRI of the abdomen on 03/02/2011 confirmed 3 liver lesions and  no additional evidence of metastatic disease. K-RAS testing on the transverse colon and left colon tumors revealed a wild type genotype. Foundation 1 testing revealed no K-ras, NRAS, or BRAF mutation. 1A. Staging PET scan 03/21/2011 confirmed 3 hypermetabolic liver lesions and no additional evidence of metastatic disease.  1B. Surgical consultation by Dr. Barry Dienes was obtained and [name] decided against an attempt at surgical resection/radiofrequency ablation.  1C. Salvage therapy with FOLFOX was initiated 04/13/2011.  1D. A restaging CT 06/28/2011 revealed a significant improvement in the hepatic metastasis.  1E. She completed eight cycles of FOLFOX chemotherapy. Treatment was switched to 5FU-leucovorin as per the FOLFOX regimen beginning with cycle #9 due to an allergic reaction (skin rash) following cycle #8.  16F. Restaging CT 10/25/2011 revealed 2 more conspicuous liver metastases .  1G. Restaging PET scan 12/24/2011 revealed an increase in the size and metabolic activity of 3 liver metastases  1H. She began FOLFIRI chemotherapy on 01/19/2012.  1 I. Restaging CT of the abdomen 05/08/2012 revealed a mixed response with enlargement of 2 liver lesions and a decrease in the size of a third lesion, no new lesions.  1J. FOLFIRI chemotherapy continued with the addition of Avastin beginning 05/09/2012.  1K. restaging CT 07/28/2012 with no new liver lesions and a decreased size of the previously noted 3 liver lesions.  1L. She was last treated with FOLFIRI/Avastin on 09/12/2012.  79M. She completed stereotactic radiotherapy to 3 liver metastases 10/10/2012 through 10/20/2012.  1N. Follow-up MRI of the abdomen 11/24/2012 showed improvement in the 3 hepatic metastases and no progressive disease.  1O. Restaging MRI 04/09/2013 showed improvement in the 3  treated hepatic lesions and 2 new lesions. No evidence of metastatic disease outside of liver.  1P. Initiation of Xeloda/Avastin 05/30/2013.  Cycle 2 initiated on 06/20/2013 (Avastin placed on hold)-Xeloda discontinued after 10 days secondary to hand/foot syndrome. Cycle 3 Xeloda question initiated on 07/28/2013 (she is unable to recall when she started or stopped cycle 3). Cycle 4 08/20/2013. Cycle 5 09/14/2013 (no Xeloda). Cycle 6 Xeloda/Avastin 10/01/2013. Cycle 7 Xeloda/Avastin 11/02/2013  1Q. restaging MRI of the liver 11/16/2013-3 enhancing right liver lesion, slightly larger. No evidence of metastasis in the left hepatic lobe.  1R. Xeloda/Avastin continued.  1S. restaging PET scan 01/28/2014 showed 2 hypermetabolic lesions in the liver corresponding to the abnormality seen on the recent MRI. The third lesion seen on the recent MRI was not discernible on the PET. No other evidence for hypermetabolic metastases.  [name] continued.  1U. CT abdomen/pelvis 06/14/2014 with new moderate ascites; large infiltrating lesions again noted within the right hepatic lobe difficult to fully assess on CT. Mildly increased hepatic atrophy with regard to the 2 larger lesions. A smaller lobulated lesion had increased mildly in size. 1V. CT chest/abdomen 09/09/2014 with progression of multifocal hepatic metastases. No evidence of metastatic disease in the chest. 1W. Initiation of 5-fluorouracil/Panitumumab on a 2 week schedule 09/10/2014. 1X. CT 01/20/2015 with likely mild improvement in multifocal hepatic metastases. 1Y. Continuation of 5-FU/Panitumumab on a 3 week schedule  1Z.CT 07/07/2015. No evidence for metastatic disease in the chest. Multifocal liver metastases without substantial change. Clear and marked interval progression of ascites. Interval development/worsening of body wall edema. Stable borderline para-aortic lymphadenopathy.  1. Diabetes. 2. History of proteinuria secondary to Avastin versus diabetes. 3. Mucositis and hand foot syndrome, secondary to 5FU improved with a dose reduction of the 5-FU. 4. Anemia  secondary to chemotherapy and renal insufficiency  5. Left back and left leg pain with an MRI of the lumbar spine confirming lumbar disk disease. She is status post surgery by Dr. Christella Noa 02/14/2008 with resolution of the back and left leg pain. 6. Rosacea. She is followed by dermatology.  7. Depression, maintained on Cymbalta 8. History of bilateral knee pain secondary to arthritis -- she takes Vicodin as needed. 9. History of anorexia -- weight loss -- ?Related to depression/anxiety or metastatic colon cancer. 10. Allergic reaction to oxaliplatin with FOLFOX on 04/13/2011. She was able to tolerate FOLFOX on 04/20/2011 with steroid premedication and a prolonged oxaliplatin infusion. After cycle #8 she developed a rash upon returning home with pruritus. She took Benadryl with resolution of symptoms. This reaction occurred despite steroid premedication. 11. Poor dentition -- she was evaluated by Dr. Enrique Sack. She underwent tooth extractions 07/04/2013. She now has dentures.  12. Neutropenia following cycle 1 of FOLFIRI. The FOLFIRI was dose reduced beginning with cycle 2. She was neutropenic 03/14/2012 and chemotherapy was held for one week.  13. Nausea and diarrhea during the irinotecan infusion on 03/21/2012. Symptoms resolved with atropine. She received atropine as a premedication with each cycle. 14. Diarrhea following the 05/09/2012 chemotherapy cycle. Likely related to irinotecan. She takes Imodium as needed. 15. History of diffuse pain 4-5 days after chemotherapy 07/17/2012-likely toxicity from Neulasta. 16. History of a fall with fracture of the right fifth metacarpal. 17. Hand/foot syndrome secondary to Xeloda-the Xeloda was dose reduced beginning with cycle 3 on 07/23/2013 (?). 18. Hypomagnesemia secondary to Panitumumab. 19. Anemia, multifactorial likely secondary to renal insufficiency, chronic disease and chemotherapy. Trial of weekly Procrit initiated 02/12/2015. 03/26/2015 hemoglobin  unchanged and Procrit  discontinued. 20. Ascites-status post a therapeutic paracentesis 03/28/2015 and 07/10/2015, negative cytology; 08/13/2015, negative cytology; 09/16/2015, negative cytology. 21. Renal failure 22. Cirrhosis   Disposition:  Ms. Quant has persistent ascites. She has been diagnosed with cirrhosis and renal failure. The CEA is rising. I explained the high likelihood of progressive colon cancer, but the cirrhosis and renal failure are most likely not directly related to colon cancer.  She is not a candidate for further systemic therapy. I recommend Hospice care. She is in agreement with a referral to the [facility] program.  We discussed CPR and ACLS issues. She could not decide on a CODE STATUS today.  Ms. Shiller has progressive anemia. She does not appear symptomatic from the anemia. I do not recommend transfusion at present.  She will undergo placement of a peritoneal drainage catheter later this week. The ascites came be drain as directed by nephrology and interventional radiology.  She will return for an office visit in approximately 2 weeks. We refilled a prescription for hydrocodone. We discontinued several nonessential medications.  Betsy Coder, MD  10/07/2015  4:11 PM

## 2015-10-07 NOTE — Telephone Encounter (Signed)
Per Dr. Benay Spice; Hospice referral called into [facility]

## 2015-10-07 NOTE — Telephone Encounter (Signed)
Call from [place] in the Lab in regards to Emily Lawson her HGB is 6.9. Relayed message to [place] who is Desk nurse for Dr. Benay Spice.

## 2015-10-08 ENCOUNTER — Telehealth: Payer: Self-pay | Admitting: Oncology

## 2015-10-08 LAB — CEA: CEA: 158.6 ng/mL — AB (ref 0.0–5.0)

## 2015-10-08 NOTE — Telephone Encounter (Signed)
Called and left a message with 11/22 appointment

## 2015-10-09 ENCOUNTER — Telehealth: Payer: Self-pay | Admitting: *Deleted

## 2015-10-09 ENCOUNTER — Other Ambulatory Visit: Payer: Self-pay | Admitting: Radiology

## 2015-10-09 NOTE — Telephone Encounter (Signed)
Gwinda Passe, RN from [facility] notified office that they will contact pt after she has peritoneal drainage catheter placed on 10/10/15; and will also address code status at that time.  Dr. Benay Spice made aware of information.

## 2015-10-10 ENCOUNTER — Other Ambulatory Visit (HOSPITAL_COMMUNITY): Payer: PPO

## 2015-10-10 ENCOUNTER — Ambulatory Visit (HOSPITAL_COMMUNITY): Payer: PPO

## 2015-10-10 ENCOUNTER — Inpatient Hospital Stay (HOSPITAL_COMMUNITY): Admission: RE | Admit: 2015-10-10 | Payer: PPO | Source: Ambulatory Visit

## 2015-10-11 DIAGNOSIS — C189 Malignant neoplasm of colon, unspecified: Secondary | ICD-10-CM | POA: Insufficient documentation

## 2015-10-11 DIAGNOSIS — C787 Secondary malignant neoplasm of liver and intrahepatic bile duct: Secondary | ICD-10-CM

## 2015-10-11 DIAGNOSIS — K7460 Unspecified cirrhosis of liver: Secondary | ICD-10-CM | POA: Insufficient documentation

## 2015-10-11 DIAGNOSIS — R188 Other ascites: Secondary | ICD-10-CM | POA: Insufficient documentation

## 2015-10-11 NOTE — Consult Note (Signed)
Chief Complaint: Recurrent ascites requiring paracentesis procedures.  Referring Physician(s): Edwards,James  History of Present Illness: Emily Lawson is a 79 y.o. female with a history of metastatic colorectal carcinoma to the liver treated by Dr. Benay Spice. She is now no longer receiving any treatment and has known metastatic lesions still in her liver. I met Emily Lawson in 2013 to discuss potential percutaneous ablation versus radioembolization of the liver to treat metastatic disease. At that time, she was not a good candidate for either procedure and she did receive additional chemotherapy. She has developed cirrhosis with associated ascites as well as renal insufficiency. Paracentesis procedures recently have been performed on 07/10/2015, 08/13/2015 and 09/16/2015 with removal of 5 L of fluid each time. Fluid does not contain malignant cells.  Past Medical History  Diagnosis Date  . Metastases to the liver dx'd 2008    chemo ongoing  . Hypertension   . Arthritis   . Heart murmur   . Chills   . Hearing loss   . Leg swelling   . Weakness   . Nausea   . Colon cancer dx'd 2008    hemicolectomy, liver bx 08/03/07  . Anemia     2ndary to chemo  . Rosacea     hx of  . Anorexia     hx of  . Anxiety   . Allergy     avandia/oxaliplatin  . Cancer 02/05/11 CT abd/pelvis    colon primary met to liver  . History of radiation therapy 10/10/2012-10/20/2012    3 liver metastases  . Anginal pain   . Hypercholesteremia   . Headache(784.0)     sinus  . Sinus drainage   . Sleep apnea     does not use CPAP often  . Depression   . GERD (gastroesophageal reflux disease)   . Complication of anesthesia     hard to wake up  . Diabetes mellitus     Past Surgical History  Procedure Laterality Date  . Colon surgery  2008  . Back surgery  2009  . Cholecystectomy  1997  . Inguinal hernia repair      as child  . Tonsillectomy      as child  . Portacath placement Left 2008    chest    . Multiple extractions with alveoloplasty N/A 07/04/2013    Procedure: extraction of tooth #'s 7,8,9,10,11,12,13,20,21,22,23,25,26,27,28,29,31  with alveoloplasty;  Surgeon: Lenn Cal, DDS;  Location: [facility] ORS;  Service: Oral Surgery;  Laterality: N/A;    Allergies: Avandia and Oxaliplatin  Medications: Prior to Admission medications   Medication Sig Start Date End Date Taking? Authorizing Provider  Alum & Mag Hydroxide-Simeth (MAGIC MOUTHWASH) SOLN Take 5 mLs by mouth 4 (four) times daily as needed for mouth pain. Use Nystatin,Diphenhydramine, Maalox formula in equal parts 10/03/14  Yes Ladell Pier, MD  aspirin 81 MG tablet Take 81 mg by mouth 2 (two) times daily.    Yes Historical Provider, MD  atorvastatin (LIPITOR) 40 MG tablet Take 40 mg by mouth at bedtime.  03/01/13  Yes Historical Provider, MD  CYMBALTA 60 MG capsule Take 60 mg by mouth at bedtime.  08/11/11  Yes Historical Provider, MD  donepezil (ARICEPT) 5 MG tablet Take 5 mg by mouth at bedtime.  02/05/14  Yes Historical Provider, MD  ferrous sulfate 325 (65 FE) MG EC tablet Take 1 tablet (325 mg total) by mouth 3 (three) times daily with meals. Patient taking differently:  Take 325 mg by mouth daily with breakfast.  02/13/15  Yes Ladell Pier, MD  furosemide (LASIX) 20 MG tablet Take 1 tablet (20 mg total) by mouth 2 (two) times daily. Patient taking differently: Take 20 mg by mouth daily.  06/14/14  Yes Virgel Manifold, MD  hydrochlorothiazide (HYDRODIURIL) 25 MG tablet Take 25 mg by mouth every morning.  07/08/11  Yes Historical Provider, MD  lidocaine-prilocaine (EMLA) cream Apply small amount to port area 1-2 hours prior to treatment and cover with plastic wrap.  DO NOT RUB IN. 09/18/14  Yes Ladell Pier, MD  MAGNESIUM-OXIDE 400 (241.3 MG) MG tablet TAKE 2 TABLETS BY MOUTH TWICE DAILY 09/03/15  Yes Ladell Pier, MD  Menthol-Methyl Salicylate (MUSCLE RUB EX)  08/19/14  Yes Historical Provider, MD  metFORMIN (GLUCOPHAGE)  1000 MG tablet Take 1,000 mg by mouth 2 (two) times daily with a meal.  06/09/11  Yes Historical Provider, MD  nystatin (MYCOSTATIN) 100000 UNIT/ML suspension SWISH AND SPIT 5 MLS BY MOUTH FOUR TIMES DAILY AS NEEDED FOR MOUTH PAIN 07/21/15  Yes Ladell Pier, MD  oxybutynin (DITROPAN) 5 MG tablet Take 5 mg by mouth 2 (two) times daily.  04/09/14  Yes Historical Provider, MD  pantoprazole (PROTONIX) 40 MG tablet Take 40 mg by mouth daily.  08/11/11  Yes Historical Provider, MD  potassium chloride SA (K-DUR,KLOR-CON) 20 MEQ tablet Take 1 tablet (20 mEq total) by mouth daily. 07/22/14  Yes Owens Shark, NP  traMADol (ULTRAM) 50 MG tablet TAKE 1 TABLET BY MOUTH EVERY 6 HOURS AS NEEDED FOR MODERATE PAIN. DO NOT TAKE WITH CYMBALTA. 10/22/14  Yes Ladell Pier, MD  acetaminophen (TYLENOL) 500 MG tablet Take 500 mg by mouth every 6 (six) hours as needed for pain.     Historical Provider, MD  alendronate (FOSAMAX) 70 MG tablet Take 70 mg by mouth every Sunday at 6pm. Will begin 03/14/12 03/01/12   Historical Provider, MD  amLODipine (NORVASC) 5 MG tablet Take 5 mg by mouth every morning.  07/24/12   Historical Provider, MD  Cholecalciferol (VITAMIN D-3 PO) Take 5,000 Units by mouth daily.     Historical Provider, MD  HYDROcodone-acetaminophen (NORCO/VICODIN) 5-325 MG tablet Take 1 tablet by mouth every 6 (six) hours as needed for moderate pain. 10/07/15   Ladell Pier, MD  minocycline (MINOCIN) 100 MG capsule Take 1 capsule (100 mg total) by mouth 2 (two) times daily. Patient not taking: Reported on 10/07/2015 09/02/14   Owens Shark, NP  prochlorperazine (COMPAZINE) 10 MG tablet Take 1 tablet (10 mg total) by mouth every 6 (six) hours as needed. 10/07/15   Ladell Pier, MD  VESICARE 10 MG tablet TK 1 T PO QPM 08/13/15   Historical Provider, MD     Family History  Problem Relation Age of Onset  . Cancer Mother     ovarian  . Heart disease Father   . Diabetes Brother     Social History   Social  History  . Marital Status: Married    Spouse Name: N/A  . Number of Children: N/A  . Years of Education: N/A   Social History Main Topics  . Smoking status: Never Smoker   . Smokeless tobacco: Never Used  . Alcohol Use: Yes     Comment: rare  . Drug Use: No  . Sexual Activity: Not on file   Other Topics Concern  . Not on file   Social History Narrative  ECOG Status: 2 - Symptomatic, <50% confined to bed  Review of Systems: A 12 point ROS discussed and pertinent positives are indicated in the HPI above.  All other systems are negative.  Review of Systems  Constitutional: Positive for fatigue.  Respiratory: Negative.   Cardiovascular: Negative.   Gastrointestinal: Positive for abdominal distention. Negative for nausea, vomiting, abdominal pain, diarrhea and constipation.  Genitourinary: Negative.     Vital Signs: BP 119/56 mmHg  Pulse 76  Temp(Src) 98.2 F (36.8 C)  Resp 16  SpO2 99%  Physical Exam  Constitutional: She is oriented to person, place, and time. No distress.  Cardiovascular: Normal rate and regular rhythm.  Exam reveals no gallop and no friction rub.   No murmur heard. Pulmonary/Chest: Effort normal and breath sounds normal. No respiratory distress. She has no wheezes. She has no rales.  Abdominal: Soft. She exhibits distension. She exhibits no mass. There is no tenderness. There is no rebound and no guarding.  Musculoskeletal: She exhibits no edema or tenderness.  Neurological: She is alert and oriented to person, place, and time.  Skin: She is not diaphoretic.  Nursing note and vitals reviewed.   Mallampati Score:     Imaging: US Paracentesis  09/16/2015  INDICATION: Metastatic colon cancer, recurrent ascites. Request is made for diagnostic and therapeutic paracentesis up to 5 liters. EXAM: ULTRASOUND-GUIDED DIAGNOSTIC AND THERAPEUTIC PARACENTESIS COMPARISON:  Prior paracentesis on 08/13/2015 MEDICATIONS: None. COMPLICATIONS: None immediate  TECHNIQUE: Informed written consent was obtained from the patient after a discussion of the risks, benefits and alternatives to treatment. A timeout was performed prior to the initiation of the procedure. Initial ultrasound scanning demonstrates a large amount of ascites within the left lower abdominal quadrant. The left lower abdomen was prepped and draped in the usual sterile fashion. 1% lidocaine was used for local anesthesia. Under direct ultrasound guidance, a 19 gauge, 10-cm, Yueh catheter was introduced. An ultrasound image was saved for documentation purposed. The paracentesis was performed. The catheter was removed and a dressing was applied. The patient tolerated the procedure well without immediate post procedural complication. FINDINGS: A total of approximately 5 liters of slightly hazy, yellow fluid was removed. Samples were sent to the laboratory as requested by the clinical team. IMPRESSION: Successful ultrasound-guided diagnostic and therapeutic paracentesis yielding 5 liters of peritoneal fluid. Read by: Rowe Robert, PA-C Electronically Signed   By: Jacqulynn Cadet M.D.   On: 09/16/2015 12:05    Labs:  CBC:  Recent Labs  08/12/15 1039 08/27/15 1129 09/17/15 1025 10/07/15 1509  WBC 8.9 8.3 7.6 7.4  HGB 9.1* 8.5* 7.4* 6.9*  HCT 27.2* 25.6* 22.6* 21.3*  PLT 300 331 246 233    COAGS: No results for input(s): INR, APTT in the last 8760 hours.  BMP:  Recent Labs  08/12/15 1039 08/27/15 1129 09/17/15 1025 10/07/15 1509  NA 140 138 139 138  K 3.4* 4.3 4.2 4.3  CO2 24 23 23 23   GLUCOSE 135 119 114 161*  BUN 29.6* 37.1* 39.1* 42.0*  CALCIUM 8.6 8.5 8.6 8.2*  CREATININE 1.9* 2.2* 2.6* 2.5*    LIVER FUNCTION TESTS:  Recent Labs  08/12/15 1039 08/27/15 1129 09/17/15 1025 10/07/15 1509  BILITOT 1.50* 1.38* 1.24* 0.96  AST 82* 45* 39* 38*  ALT 39 21 17 15   ALKPHOS 691* 721* 615* 548*  PROT 6.0* 6.0* 5.7* 5.5*  ALBUMIN 2.1* 1.9* 1.7* 1.5*    TUMOR  MARKERS:  Recent Labs  06/18/15 1037 07/21/15 1031 08/27/15 1129  10/07/15 1509  CEA 18.2* 32.8* 133.4* 158.6*    Assessment and Plan:  I met with Emily Lawson and her daughter. We discussed treatment options for management of ascites. Given her poor prognosis and end-stage malignancy, she is not a candidate for either a TIPS procedure or [place] shunt in the management of ascites. Other than continued paracentesis procedures, the only other procedure we discussed was the potential placement of a peritoneal tunneled drainage catheter for periodic removal of ascites. Although there is some infection risk, placement of a peritoneal drainage catheter would be of greater convenience to the patient and her family for ascites removal. She is getting weaker from her disease and does not like to travel to the hospital. She is scheduled to talk to Dr. Benay Spice in the next week. I recommended that she discuss drainage catheter placement with him. Details of placement were discussed with the patient. [facility]  Signed: Stephaine Breshears T 10/11/2015, 11:46 AM   I spent a total of  25 Minutes in face to face in clinical consultation, greater than 50% of which was counseling/coordinating care for management of ascites.

## 2015-10-13 ENCOUNTER — Other Ambulatory Visit: Payer: Self-pay | Admitting: Radiology

## 2015-10-14 ENCOUNTER — Ambulatory Visit (HOSPITAL_COMMUNITY)
Admission: RE | Admit: 2015-10-14 | Discharge: 2015-10-14 | Disposition: A | Discharge status: Home / Self Care | Payer: PPO | Source: Ambulatory Visit | Attending: Oncology | Admitting: Oncology

## 2015-10-14 ENCOUNTER — Encounter (HOSPITAL_COMMUNITY): Payer: Self-pay

## 2015-10-14 ENCOUNTER — Ambulatory Visit (HOSPITAL_COMMUNITY)
Admission: RE | Admit: 2015-10-14 | Discharge: 2015-10-14 | Disposition: A | Discharge status: Home / Self Care | Payer: PPO | Source: Ambulatory Visit | Attending: Interventional Radiology | Admitting: Interventional Radiology

## 2015-10-14 ENCOUNTER — Other Ambulatory Visit: Payer: Self-pay | Admitting: Interventional Radiology

## 2015-10-14 DIAGNOSIS — K7460 Unspecified cirrhosis of liver: Secondary | ICD-10-CM | POA: Insufficient documentation

## 2015-10-14 DIAGNOSIS — Z7982 Long term (current) use of aspirin: Secondary | ICD-10-CM | POA: Insufficient documentation

## 2015-10-14 DIAGNOSIS — C189 Malignant neoplasm of colon, unspecified: Secondary | ICD-10-CM | POA: Diagnosis not present

## 2015-10-14 DIAGNOSIS — R188 Other ascites: Secondary | ICD-10-CM | POA: Diagnosis not present

## 2015-10-14 DIAGNOSIS — N289 Disorder of kidney and ureter, unspecified: Secondary | ICD-10-CM | POA: Insufficient documentation

## 2015-10-14 DIAGNOSIS — Z7984 Long term (current) use of oral hypoglycemic drugs: Secondary | ICD-10-CM | POA: Insufficient documentation

## 2015-10-14 LAB — BASIC METABOLIC PANEL
Anion gap: 9 (ref 5–15)
BUN: 42 mg/dL — AB (ref 6–20)
CO2: 24 mmol/L (ref 22–32)
CREATININE: 2.86 mg/dL — AB (ref 0.44–1.00)
Calcium: 8.1 mg/dL — ABNORMAL LOW (ref 8.9–10.3)
Chloride: 106 mmol/L (ref 101–111)
GFR, EST AFRICAN AMERICAN: 17 mL/min — AB (ref >60)
GFR, EST NON AFRICAN AMERICAN: 15 mL/min — AB (ref >60)
Glucose, Bld: 122 mg/dL — ABNORMAL HIGH (ref 65–99)
POTASSIUM: 3.8 mmol/L (ref 3.5–5.1)
SODIUM: 139 mmol/L (ref 135–145)

## 2015-10-14 LAB — CBC
HEMATOCRIT: 21 % — AB (ref 36.0–46.0)
Hemoglobin: 6.7 g/dL — CL (ref 12.0–15.0)
MCH: 33.3 pg (ref 26.0–34.0)
MCHC: 31.4 g/dL (ref 30.0–36.0)
MCV: 106.2 fL — ABNORMAL HIGH (ref 78.0–100.0)
PLATELETS: 228 10*3/uL (ref 150–400)
RBC: 1.98 MIL/uL — ABNORMAL LOW (ref 3.87–5.11)
RDW: 17.6 % — AB (ref 11.5–15.5)
WBC: 5.3 10*3/uL (ref 4.0–10.5)

## 2015-10-14 LAB — PROTIME-INR
INR: 1.23 (ref 0.00–1.49)
Prothrombin Time: 15.6 seconds — ABNORMAL HIGH (ref 11.6–15.2)

## 2015-10-14 LAB — APTT: APTT: 43 s — AB (ref 24–37)

## 2015-10-14 MED ORDER — LIDOCAINE HCL 1 % IJ SOLN
INTRAMUSCULAR | Status: AC
  Filled 2015-10-14: qty 20

## 2015-10-14 MED ORDER — FENTANYL CITRATE (PF) 100 MCG/2ML IJ SOLN
INTRAMUSCULAR | Status: AC
  Filled 2015-10-14: qty 2

## 2015-10-14 MED ORDER — HEPARIN SOD (PORK) LOCK FLUSH 100 UNIT/ML IV SOLN
500.0000 [IU] | Freq: Once | INTRAVENOUS | Status: DC
  Filled 2015-10-14: qty 5

## 2015-10-14 MED ORDER — CEFAZOLIN SODIUM-DEXTROSE 2-3 GM-% IV SOLR
INTRAVENOUS | Status: AC
  Filled 2015-10-14: qty 50

## 2015-10-14 MED ORDER — CEFAZOLIN SODIUM-DEXTROSE 2-3 GM-% IV SOLR
2.0000 g | INTRAVENOUS | Status: AC
  Administered 2015-10-14: 2 g via INTRAVENOUS

## 2015-10-14 MED ORDER — MIDAZOLAM HCL 2 MG/2ML IJ SOLN
INTRAMUSCULAR | Status: AC
  Filled 2015-10-14: qty 4

## 2015-10-14 MED ORDER — MIDAZOLAM HCL 2 MG/2ML IJ SOLN
INTRAMUSCULAR | Status: AC | PRN
  Administered 2015-10-14: 0.5 mg via INTRAVENOUS

## 2015-10-14 MED ORDER — FENTANYL CITRATE (PF) 100 MCG/2ML IJ SOLN
INTRAMUSCULAR | Status: AC | PRN
  Administered 2015-10-14: 25 ug via INTRAVENOUS

## 2015-10-14 MED ORDER — SODIUM CHLORIDE 0.9 % IV SOLN
Freq: Once | INTRAVENOUS | Status: AC
  Administered 2015-10-14: 500 mL via INTRAVENOUS

## 2015-10-14 NOTE — Progress Notes (Signed)
CRITICAL VALUE ALERT  Critical value received:  Hemaglobin - Per Estill Bamberg in lab- three separate readings for the hgb ( 2 critical values)- Dr Kathlene Cote did not want the lab redrawn- HGB 6.7 is the value Estill Bamberg was using  Date of notification:  10/14/15  Time of notification: 11:30  Critical value read back:Yes.     Nurse who received alert: K Kloe Oates RT (R)(M)(CV)  MD notified in person :  Dr Kathlene Cote  Time notified:  11:30    MD responded:  Do not redraw the labs.  He is familiar with the patient and he anticipated the critical value for the hemaglobin.

## 2015-10-14 NOTE — Discharge Instructions (Signed)
Tunneled Catheter Insertion °Catheters are thin, flexible tubes that are inserted into a vein to provide access to the bloodstream. A tunneled catheter is used when a person's bloodstream needs to be accessed many times over a long period, usually longer than 30 days. The catheter provides a painless method of drawing blood, giving blood products, removing waste products from the blood (hemodialysis), and giving medicines. Tunneled catheters can be placed in different parts of the body depending on how they will be used. These catheters are secure and easy to access. A part of the catheter is tunneled under the skin. This is done to decrease the risk of infection.  °There are various types of tunneled catheters. The specific one used will depend on your needs. The catheter can be used right after insertion. °LET YOUR HEALTH CARE PROVIDER KNOW ABOUT:  °· Any allergies you have. °· All medicines you are taking, including vitamins, herbs, eyedrops, and over-the-counter medicines and creams.   °· Previous problems you or members of your family have had with the use of anesthetics.   °· Any blood disorders you have had. °· Possibility of pregnancy, if this applies.   °· Other health problems you have. Also, let your health care provider know if you have a pacemaker. °RISKS AND COMPLICATIONS °Generally, tunneled catheter insertion is a safe procedure. However, as with any surgical procedure, complications can occur. Possible complications include: °· Damage to the blood vessel.   °· Bruising or bleeding at the site of puncture.   °· Introduction of the catheter into an artery instead of a vein.   °· Skin infection at the site of catheter insertion.   °· Bloodstream infection, especially if your white blood cell count is low.   °· Developing a kink in the catheter so it does not work properly.   °· Developing a hole or crack in the catheter.   °· Blockage of the catheter.   °· Getting air in the catheter. °· Blood clots  around the catheter or in the vein near the catheter. °· Disturbance in the normal heart rhythm (rare). This is usually temporary.   °· A collapsed lung during insertion (rare).   °BEFORE THE PROCEDURE  °· You may need to have blood tests done before the day of the procedure.   °· Do not eat or drink anything for at least 8 hours before the procedure or as directed by your health care provider.   °· Ask your health care provider about changing or stopping your regular medicines. °· Avoid wearing jewelry the day of the procedure.   °· Make plans to have someone drive you home after the procedure. You should not drive immediately after the procedure.   °PROCEDURE °· You will be asked to lie on your back. °· A regular intravenous (IV) access tube may be put into a vein in your hand or arm. During the procedure, medicine can flow directly into your body through the IV tube. °· Small monitors will be put on your body. They are used to check your heart, blood pressure, and oxygen level. °· The catheter site is usually shaved, cleaned, and covered with a sterile drape. °· You will be given medicine to numb the area where the catheter will be placed (local anesthetic). You may also be given a medicine to help you relax (sedative). °· The health care provider will then make a small incision in the skin, usually in the lower neck. Another small incision is made a little lower, usually on the shoulder or upper chest. Ultrasonography may be used so that the health   care provider can see the vein and can properly guide the catheter placement.  X-ray equipment may also be used to help ensure that the catheter is inserted safely and is placed where it will function most effectively. This equipment allows the health care provider to watch the catheter on a live display while guiding it into place.  Generally, a small guidewire is put into the vein first. A tunnel is created under the skin. The health care provider guides the  movement of the guidewire into a larger vein closer to the heart.  The catheter is pulled through the tunnel and then moved into the larger vein. The cuff on the catheter is located in the tunnel part. The cuff helps to anchor the catheter in place over time. Stitches are used to keep the catheter in place when first put in.  An X-ray may be done to make sure the catheter is in the right place. AFTER THE PROCEDURE   You may stay in a recovery area until the sedation has worn off.  Your heart rate, blood pressure and oxygen level will be monitored.  You may have some pain and swelling in the neck or shoulder. You will likely be given medicine to control this.  If this was done as an outpatient procedure, you may be able to go home the same day.   This information is not intended to replace advice given to you by your health care provider. Make sure you discuss any questions you have with your health care provider.   Document Released: 12/05/2007 Document Revised: 12/06/2014 Document Reviewed: 09/27/2012 Elsevier Interactive Patient Education 2016 [employer]  Moderate Conscious Sedation, Adult, Care After Refer to this sheet in the next few weeks. These instructions provide you with information on caring for yourself after your procedure. Your health care provider may also give you more specific instructions. Your treatment has been planned according to current medical practices, but problems sometimes occur. Call your health care provider if you have any problems or questions after your procedure. WHAT TO EXPECT AFTER THE PROCEDURE  After your procedure:  You may feel sleepy, clumsy, and have poor balance for several hours.  Vomiting may occur if you eat too soon after the procedure. HOME CARE INSTRUCTIONS  Do not participate in any activities where you could become injured for at least 24 hours. Do not:  Drive.  Swim.  Ride a bicycle.  Operate heavy  machinery.  Cook.  Use power tools.  Climb ladders.  Work from a high place.  Do not make important decisions or sign legal documents until you are improved.  If you vomit, drink water, juice, or soup when you can drink without vomiting. Make sure you have little or no nausea before eating solid foods.  Only take over-the-counter or prescription medicines for pain, discomfort, or fever as directed by your health care provider.  Make sure you and your family fully understand everything about the medicines given to you, including what side effects may occur.  You should not drink alcohol, take sleeping pills, or take medicines that cause drowsiness for at least 24 hours.  If you smoke, do not smoke without supervision.  If you are feeling better, you may resume normal activities 24 hours after you were sedated.  Keep all appointments with your health care provider. SEEK MEDICAL CARE IF:  Your skin is pale or bluish in color.  You continue to feel nauseous or vomit.  Your pain is getting worse and  is not helped by medicine.  You have bleeding or swelling.  You are still sleepy or feeling clumsy after 24 hours. SEEK IMMEDIATE MEDICAL CARE IF:  You develop a rash.  You have difficulty breathing.  You develop any type of allergic problem.  You have a fever. MAKE SURE YOU:  Understand these instructions.  Will watch your condition.  Will get help right away if you are not doing well or get worse.   This information is not intended to replace advice given to you by your health care provider. Make sure you discuss any questions you have with your health care provider.   Document Released: 09/05/2013 Document Revised: 12/06/2014 Document Reviewed: 09/05/2013 Elsevier Interactive Patient Education [employer].

## 2015-10-14 NOTE — H&P (Signed)
HPI: Patient with metastatic colorectal carcinoma who follows with Dr. Benay Spice- now starting hospice program. Patient also with cirrhosis and renal insufficiency with recurrent large volume ascites requiring multiple paracentesis. The patient was seen in full IR consult on 10/02/15 and has been scheduled today for image guided tunneled peritoneal catheter placement. She denies any active infections or recent fever or chills.    The patient has had a H&P performed within the last 30 days, all history, medications, and exam have been reviewed. The patient denies any interval changes since the H&P.  Medications: Prior to Admission medications   Medication Sig Start Date End Date Taking? Authorizing Provider  aspirin 81 MG tablet Take 81 mg by mouth 2 (two) times daily.    Yes Historical Provider, MD  donepezil (ARICEPT) 5 MG tablet Take 5 mg by mouth at bedtime.  02/05/14  Yes Historical Provider, MD  furosemide (LASIX) 20 MG tablet Take 1 tablet (20 mg total) by mouth 2 (two) times daily. Patient taking differently: Take 20 mg by mouth daily.  06/14/14  Yes Virgel Manifold, MD  MAGNESIUM-OXIDE 400 (241.3 MG) MG tablet TAKE 2 TABLETS BY MOUTH TWICE DAILY 09/03/15  Yes Ladell Pier, MD  prochlorperazine (COMPAZINE) 10 MG tablet Take 1 tablet (10 mg total) by mouth every 6 (six) hours as needed. 10/07/15  Yes Ladell Pier, MD  traMADol (ULTRAM) 50 MG tablet TAKE 1 TABLET BY MOUTH EVERY 6 HOURS AS NEEDED FOR MODERATE PAIN. DO NOT TAKE WITH CYMBALTA. 10/22/14  Yes Ladell Pier, MD  acetaminophen (TYLENOL) 500 MG tablet Take 500 mg by mouth every 6 (six) hours as needed for pain.     Historical Provider, MD  alendronate (FOSAMAX) 70 MG tablet Take 70 mg by mouth every Sunday at 6pm. Will begin 03/14/12 03/01/12   Historical Provider, MD  Alum & Mag Hydroxide-Simeth (MAGIC MOUTHWASH) SOLN Take 5 mLs by mouth 4 (four) times daily as needed for mouth pain. Use Nystatin,Diphenhydramine, Maalox formula in  equal parts 10/03/14   Ladell Pier, MD  amLODipine (NORVASC) 5 MG tablet Take 5 mg by mouth every morning.  07/24/12   Historical Provider, MD  atorvastatin (LIPITOR) 40 MG tablet Take 40 mg by mouth at bedtime.  03/01/13   Historical Provider, MD  Cholecalciferol (VITAMIN D-3 PO) Take 5,000 Units by mouth daily.     Historical Provider, MD  CYMBALTA 60 MG capsule Take 60 mg by mouth at bedtime.  08/11/11   Historical Provider, MD  ferrous sulfate 325 (65 FE) MG EC tablet Take 1 tablet (325 mg total) by mouth 3 (three) times daily with meals. Patient taking differently: Take 325 mg by mouth daily with breakfast.  02/13/15   Ladell Pier, MD  hydrochlorothiazide (HYDRODIURIL) 25 MG tablet Take 25 mg by mouth every morning.  07/08/11   Historical Provider, MD  HYDROcodone-acetaminophen (NORCO/VICODIN) 5-325 MG tablet Take 1 tablet by mouth every 6 (six) hours as needed for moderate pain. 10/07/15   Ladell Pier, MD  lidocaine-prilocaine (EMLA) cream Apply small amount to port area 1-2 hours prior to treatment and cover with plastic wrap.  DO NOT RUB IN. 09/18/14   Ladell Pier, MD  Menthol-Methyl Salicylate (MUSCLE RUB EX)  08/19/14   Historical Provider, MD  metFORMIN (GLUCOPHAGE) 1000 MG tablet Take 1,000 mg by mouth 2 (two) times daily with a meal.  06/09/11   Historical Provider, MD  minocycline (MINOCIN) 100 MG capsule Take 1 capsule (100 mg  total) by mouth 2 (two) times daily. Patient not taking: Reported on 10/07/2015 09/02/14   Owens Shark, NP  nystatin (MYCOSTATIN) 100000 UNIT/ML suspension SWISH AND SPIT 5 MLS BY MOUTH FOUR TIMES DAILY AS NEEDED FOR MOUTH PAIN 07/21/15   Ladell Pier, MD  oxybutynin (DITROPAN) 5 MG tablet Take 5 mg by mouth 2 (two) times daily.  04/09/14   Historical Provider, MD  pantoprazole (PROTONIX) 40 MG tablet Take 40 mg by mouth daily.  08/11/11   Historical Provider, MD  potassium chloride SA (K-DUR,KLOR-CON) 20 MEQ tablet Take 1 tablet (20 mEq total) by mouth  daily. 07/22/14   Owens Shark, NP  VESICARE 10 MG tablet TK 1 T PO QPM 08/13/15   Historical Provider, MD     Vital Signs: BP 112/70 mmHg  Pulse 87  Temp(Src) 98.4 F (36.9 C) (Oral)  Resp 16  SpO2 100%  Physical Exam  Constitutional: She is oriented to person, place, and time. No distress.  HENT:  Head: Normocephalic and atraumatic.  Cardiovascular: Normal rate and regular rhythm.  Exam reveals no gallop and no friction rub.   No murmur heard. Pulmonary/Chest: Effort normal and breath sounds normal. No respiratory distress. She has no wheezes. She has no rales.  Abdominal: Soft. She exhibits distension.  Neurological: She is alert and oriented to person, place, and time.  Skin: Skin is warm and dry. She is not diaphoretic.    Mallampati Score:  MD Evaluation Airway: WNL Heart: WNL Abdomen: WNL Chest/ Lungs: WNL ASA  Classification: 3 Mallampati/Airway Score: Two  Labs:  CBC:  Recent Labs  08/27/15 1129 09/17/15 1025 10/07/15 1509 10/14/15 1000  WBC 8.3 7.6 7.4 5.3  HGB 8.5* 7.4* 6.9* 6.7*  HCT 25.6* 22.6* 21.3* 21.0*  PLT 331 246 233 228    COAGS:  Recent Labs  10/14/15 1000  INR 1.23  APTT 43*    BMP:  Recent Labs  08/27/15 1129 09/17/15 1025 10/07/15 1509 10/14/15 1000  NA 138 139 138 139  K 4.3 4.2 4.3 3.8  CL  --   --   --  106  CO2 23 23 23 24   GLUCOSE 119 114 161* 122*  BUN 37.1* 39.1* 42.0* 42*  CALCIUM 8.5 8.6 8.2* 8.1*  CREATININE 2.2* 2.6* 2.5* 2.86*  GFRNONAA  --   --   --  15*  GFRAA  --   --   --  17*    LIVER FUNCTION TESTS:  Recent Labs  08/12/15 1039 08/27/15 1129 09/17/15 1025 10/07/15 1509  BILITOT 1.50* 1.38* 1.24* 0.96  AST 82* 45* 39* 38*  ALT 39 21 17 15   ALKPHOS 691* 721* 615* 548*  PROT 6.0* 6.0* 5.7* 5.5*  ALBUMIN 2.1* 1.9* 1.7* 1.5*    Assessment/Plan:  Metastatic colorectal carcinoma Cirrhosis Renal insufficiency  Recurrent large volume ascites requiring multiple paracentesis, cytology with  no malignant cells Seen in full IR consult on 10/02/15 Scheduled today for image guided tunneled peritoneal catheter placement with sedation The patient has been NPO, no blood thinners taken, labs and vitals have been reviewed. Risks and Benefits discussed with the patient including, but not limited to bleeding, infection possible leading to sepsis and death. All of the patient's questions were answered, patient is agreeable to proceed. Consent signed and in chart.    SignedHedy Jacob 10/14/2015, 11:38 AM

## 2015-10-14 NOTE — Procedures (Signed)
Interventional Radiology Procedure Note  Procedure:  Tunneled peritoneal drainage catheter placement  Complications:  None   Estimated Blood Loss: < 10 mL  16 Fr peritoneal tunneled PleurX catheter placed via right abdominal wall. 5 L ascites removed today. No complications.  Venetia Night. Kathlene Cote, M.D Pager:  [phone]

## 2015-10-16 ENCOUNTER — Telehealth: Payer: Self-pay | Admitting: *Deleted

## 2015-10-16 MED ORDER — LACTULOSE 20 GM/30ML PO SOLN: ORAL | Status: DC

## 2015-10-16 NOTE — Telephone Encounter (Signed)
Message from [place] requesting to discuss care plan. Pt's daughter is requesting labs. Informed her pt will be seen 11/22, will discuss expectations again at next visit. She reports pt is taking Dulcolax for constipation with little relief. Cristela Blue asks if Lactulose will be appropriate.  Discussed with Dr. Benay Spice: Order received for Lactulose 15 ml BID until BM then once daily.  Verbal order given for weekly drainage of ascites weekly PRN. Symptom management per hospice MD as needed. Maura voiced understanding. Rx sent electronically.

## 2015-10-20 ENCOUNTER — Telehealth: Payer: Self-pay | Admitting: Oncology

## 2015-10-20 NOTE — Telephone Encounter (Signed)
DUE TO LT OUT MOVE 11/22 LB/FU TO BS 12/2. LEFT MESSAGE FOR PATIENT

## 2015-10-21 ENCOUNTER — Other Ambulatory Visit: Payer: PPO

## 2015-10-21 ENCOUNTER — Ambulatory Visit: Payer: PPO | Admitting: Nurse Practitioner

## 2015-10-22 ENCOUNTER — Telehealth: Payer: Self-pay | Admitting: *Deleted

## 2015-10-22 NOTE — Telephone Encounter (Signed)
Update: Message from Brandon Melnick, Hospice RN reporting she drained pt's ascites today. Removed 2.2 liters pale yellow fluid. She reports pt has been itching. Cristela Blue will implement hospice standing order for itching. BP today 102/60. 3+ pitting edema to thigh.

## 2015-10-29 ENCOUNTER — Other Ambulatory Visit: Payer: Self-pay | Admitting: Radiology

## 2015-10-29 ENCOUNTER — Other Ambulatory Visit: Payer: Self-pay | Admitting: Interventional Radiology

## 2015-10-29 ENCOUNTER — Telehealth: Payer: Self-pay | Admitting: *Deleted

## 2015-10-29 DIAGNOSIS — C189 Malignant neoplasm of colon, unspecified: Secondary | ICD-10-CM

## 2015-10-29 DIAGNOSIS — R188 Other ascites: Secondary | ICD-10-CM

## 2015-10-29 DIAGNOSIS — C787 Secondary malignant neoplasm of liver and intrahepatic bile duct: Principal | ICD-10-CM

## 2015-10-29 NOTE — Telephone Encounter (Signed)
Brandon Melnick, Hospice RN called to report she drained pt's ascites today and removed 1281ml pale yellow fluid. She reports pt has been itching but has been taking her meds for this. Pt reports "being comfortable" after ascites drained.  Dr. Benay Spice made aware.

## 2015-10-30 ENCOUNTER — Ambulatory Visit (HOSPITAL_COMMUNITY)
Admission: RE | Admit: 2015-10-30 | Discharge: 2015-10-30 | Disposition: A | Discharge status: Home / Self Care | Payer: PPO | Source: Ambulatory Visit | Attending: Interventional Radiology | Admitting: Interventional Radiology

## 2015-10-30 ENCOUNTER — Telehealth: Payer: Self-pay | Admitting: *Deleted

## 2015-10-30 DIAGNOSIS — R188 Other ascites: Secondary | ICD-10-CM | POA: Insufficient documentation

## 2015-10-30 DIAGNOSIS — C189 Malignant neoplasm of colon, unspecified: Secondary | ICD-10-CM

## 2015-10-30 DIAGNOSIS — C787 Secondary malignant neoplasm of liver and intrahepatic bile duct: Secondary | ICD-10-CM

## 2015-10-30 DIAGNOSIS — Z4803 Encounter for change or removal of drains: Secondary | ICD-10-CM | POA: Insufficient documentation

## 2015-10-30 DIAGNOSIS — Z538 Procedure and treatment not carried out for other reasons: Secondary | ICD-10-CM | POA: Diagnosis not present

## 2015-10-30 LAB — CBC WITH DIFFERENTIAL/PLATELET
BASOS ABS: 0 10*3/uL (ref 0.0–0.1)
Basophils Relative: 0 %
EOS ABS: 0.3 10*3/uL (ref 0.0–0.7)
Eosinophils Relative: 3 %
HCT: 26.1 % — ABNORMAL LOW (ref 36.0–46.0)
HEMOGLOBIN: 8.2 g/dL — AB (ref 12.0–15.0)
LYMPHS ABS: 1.7 10*3/uL (ref 0.7–4.0)
LYMPHS PCT: 17 %
MCH: 32.5 pg (ref 26.0–34.0)
MCHC: 31.4 g/dL (ref 30.0–36.0)
MCV: 103.6 fL — ABNORMAL HIGH (ref 78.0–100.0)
Monocytes Absolute: 0.4 10*3/uL (ref 0.1–1.0)
Monocytes Relative: 4 %
NEUTROS PCT: 76 %
Neutro Abs: 7.8 10*3/uL — ABNORMAL HIGH (ref 1.7–7.7)
Platelets: 295 10*3/uL (ref 150–400)
RBC: 2.52 MIL/uL — AB (ref 3.87–5.11)
RDW: 17 % — ABNORMAL HIGH (ref 11.5–15.5)
WBC: 10.2 10*3/uL (ref 4.0–10.5)

## 2015-10-30 LAB — PROTIME-INR
INR: 1.17 (ref 0.00–1.49)
Prothrombin Time: 15 seconds (ref 11.6–15.2)

## 2015-10-30 MED ORDER — CEFAZOLIN SODIUM-DEXTROSE 2-3 GM-% IV SOLR: 2.0000 g | Freq: Once | INTRAVENOUS | Status: DC

## 2015-10-30 NOTE — Progress Notes (Signed)
Pt scheduled for Peritoneal PleurX catheter exchange today due a 'leak or crack in the line' Was just drained yesterday, 1278mL. Per caregiver here with pt today, 'the cap would not stay on so the [facility] had to tape it' Drain apparently works well otherwise. Pt c/o some itching.  Inspection reveals right sided catheter intact, site clean, no leak at either skin site. Could not find any leak or crack in catheter tubing or connector. I connected her to suction cannister and removed 1200 mL clear, pale yellow fluid without difficulty. No leak apparent. Placed new locking cap onto end connector and covered catheter/site with new sterile dressing.  No need for exchange as this catheter is in perfect working condition.  Spoke with pt daughter, who reports pt abdomen still feels full after she is drained and that she is only drained once a week. Pt was previously having 5 liters of ascites removed with each paracentesis. She may require more frequent draining at home, or they may need to remove more volume with each visit. Recommend draining at least twice weekly.  Ascencion Dike PA-C Interventional Radiology 10/30/2015 1:47 PM

## 2015-10-30 NOTE — Telephone Encounter (Signed)
Received message from pt asking "do I need to come tomorrow for my appt with Dr. Benay Spice; I'm having my tube replaced today"  Per Dr. Benay Spice; spoke with pt's daughter; she states they are replacing  peritoneal catheter today---MD gave option that its up to pt if she wants to come or next available is 3 weeks out.  Per pt's daughter; requested to reschedule for next available.  Dr. Benay Spice made aware.

## 2015-10-31 ENCOUNTER — Telehealth: Payer: Self-pay | Admitting: *Deleted

## 2015-10-31 ENCOUNTER — Ambulatory Visit: Payer: PPO | Admitting: Oncology

## 2015-10-31 ENCOUNTER — Other Ambulatory Visit: Payer: PPO

## 2015-10-31 NOTE — Telephone Encounter (Signed)
Per Dr. Benay Spice; MD spoke with pt's daughter yesterday via phone; today office relayed verbal order to [facility] [place] for hospice to increase peritoneal catheter drainage up to 2 liters every 3 days.  Cristela Blue, RN verbalized understanding and expressed appreciation for call.

## 2015-11-03 ENCOUNTER — Telehealth: Payer: Self-pay | Admitting: *Deleted

## 2015-11-03 NOTE — Telephone Encounter (Signed)
Maura, RN Hospice requesting verbal order for peritoneal drain to be done twice a week as needed.  Per Dr. Benay Spice; verbal order given for peritoneal drain to be done twice a week as needed.  Maura verbalized understanding.

## 2015-11-03 NOTE — Telephone Encounter (Signed)
Maura, Hospice RN left voice message with update; "drained off 1.1 liters of fluid today; pt feeling well; slightly nauseous but we talked about how to handle that"  Dr. Benay Spice made aware.

## 2015-11-06 ENCOUNTER — Telehealth: Payer: Self-pay | Admitting: *Deleted

## 2015-11-06 NOTE — Telephone Encounter (Signed)
Maura, RN hospice called with update on pt: "drained 2 liters off today from drain; pt tolerated well; is comfortable and instructed her to call this weekend if any needs; will visit again early next week" Dr. Benay Spice made aware.

## 2015-11-14 ENCOUNTER — Telehealth: Payer: Self-pay | Admitting: *Deleted

## 2015-11-14 MED ORDER — VESICARE 10 MG PO TABS: 10.0000 mg | ORAL_TABLET | Freq: Every day | ORAL | Status: DC

## 2015-11-14 NOTE — Telephone Encounter (Signed)
Message from [place] with hospice reporting they are draining pt's peritoneal drain every Mon and Thurs. Averages about 2 liters. Requesting refill on pt's Vesicare. Pt is reporting pelvic pain. Pt also stated she was told to avoid "red drinks" would like to drink Coke. Cristela Blue asks if this is related to her liver.  Reviewed with Dr. Benay Spice: MD did not tell her to avoid red drinks. Rx e-prescribed per Dr. Benay Spice.

## 2015-11-17 ENCOUNTER — Telehealth: Payer: Self-pay | Admitting: Oncology

## 2015-11-17 NOTE — Telephone Encounter (Signed)
pt cld to r/s appt-gave pt r/s time & date °

## 2015-11-21 ENCOUNTER — Ambulatory Visit: Payer: PPO | Admitting: Oncology

## 2015-12-11 ENCOUNTER — Telehealth: Payer: Self-pay | Admitting: *Deleted

## 2015-12-11 NOTE — Telephone Encounter (Signed)
[place] RN update on pt:  Reports "I'm still draining 3 liter of fluid bid, pt tolerates well, the fluid is yellow concentrated; pt c/o of being itchy; she is weak and has lost some weight; ambulates very slowly; knows she has an appt to see you on Monday 1/16."

## 2015-12-14 ENCOUNTER — Other Ambulatory Visit: Payer: Self-pay | Admitting: Oncology

## 2015-12-15 ENCOUNTER — Ambulatory Visit (HOSPITAL_BASED_OUTPATIENT_CLINIC_OR_DEPARTMENT_OTHER): Payer: PPO | Admitting: Oncology

## 2015-12-15 ENCOUNTER — Telehealth: Payer: Self-pay | Admitting: Oncology

## 2015-12-15 VITALS — BP 111/51 | HR 80 | Temp 97.6°F | Resp 18 | Ht 61.0 in | Wt 149.4 lb

## 2015-12-15 DIAGNOSIS — C787 Secondary malignant neoplasm of liver and intrahepatic bile duct: Secondary | ICD-10-CM

## 2015-12-15 DIAGNOSIS — C188 Malignant neoplasm of overlapping sites of colon: Secondary | ICD-10-CM

## 2015-12-15 DIAGNOSIS — C189 Malignant neoplasm of colon, unspecified: Secondary | ICD-10-CM

## 2015-12-15 DIAGNOSIS — K7460 Unspecified cirrhosis of liver: Secondary | ICD-10-CM | POA: Diagnosis not present

## 2015-12-15 DIAGNOSIS — N19 Unspecified kidney failure: Secondary | ICD-10-CM

## 2015-12-15 NOTE — Telephone Encounter (Signed)
Talked to patient here in office. Scheduled appt.       [facility]. °

## 2015-12-15 NOTE — Progress Notes (Signed)
[facility] OFFICE PROGRESS NOTE   Diagnosis:  Colon cancer  INTERVAL HISTORY:    Ms. Hurtubise returns for scheduled visit. She is accompanied by her daughter and a caretaker. She has a good appetite. No pain. The abdomen is distended. She has a peritoneal drainage catheter in place. The catheter is drained 2 days per week. The family report Ms. Standre is lethargic after each drainage procedure.  Objective:  Vital signs in last 24 hours:  Blood pressure 111/51, pulse 80, temperature 97.6 F (36.4 C), temperature source Oral, resp. rate 18, height 5' 1"  (1.549 m), weight 149 lb 6.4 oz (67.767 kg), SpO2 100 %.    HEENT:  No thrush Resp:  Lungs clear bilaterally Cardio:  Regular rate and rhythm GI:  Distended with ascites, right lower quadrant peritoneal catheter with a gauze dressing Vascular:  No leg edema   Portacath/PICC-without erythema   Medications: I have reviewed the patient's current medications.  Assessment/Plan: 1. Metastatic colon cancer presenting with synchronous colon primaries in September, 2008, status post a hemicolectomy and liver biopsy 08/03/2007. A CT on 04/20/2010 revealed no evidence metastatic disease. She was maintained on 5-FU/leucovorin per the FOLFOX regimen beginning in April, 2009. Avasta was discontinued from the chemotherapy regimen beginning in November, 2009 due to nephrotic range proteinuria. A CT of the abdomen and pelvis 02/05/2011 revealed evidence of three small liver lesions. An MRI of the abdomen on 03/02/2011 confirmed 3 liver lesions and no additional evidence of metastatic disease. K-RAS testing on the transverse colon and left colon tumors revealed a wild type genotype. Foundation 1 testing revealed no K-ras, NRAS, or BRAF mutation. 1A. Staging PET scan 03/21/2011 confirmed 3 hypermetabolic liver lesions and no additional evidence of metastatic disease.  1B. Surgical consultation by Dr. Barry Dienes was obtained and Ms. Binney decided  against an attempt at surgical resection/radiofrequency ablation.  1C. Salvage therapy with FOLFOX was initiated 04/13/2011.  1D. A restaging CT 06/28/2011 revealed a significant improvement in the hepatic metastasis.  1E. She completed eight cycles of FOLFOX chemotherapy. Treatment was switched to 5FU-leucovorin as per the FOLFOX regimen beginning with cycle #9 due to an allergic reaction (skin rash) following cycle #8.  84F. Restaging CT 10/25/2011 revealed 2 more conspicuous liver metastases .  1G. Restaging PET scan 12/24/2011 revealed an increase in the size and metabolic activity of 3 liver metastases  1H. She began FOLFIRI chemotherapy on 01/19/2012.  1 I. Restaging CT of the abdomen 05/08/2012 revealed a mixed response with enlargement of 2 liver lesions and a decrease in the size of a third lesion, no new lesions.  1J. FOLFIRI chemotherapy continued with the addition of Avastin beginning 05/09/2012.  1K. restaging CT 07/28/2012 with no new liver lesions and a decreased size of the previously noted 3 liver lesions.  1L. She was last treated with FOLFIRI/Avastin on 09/12/2012.  10M. She completed stereotactic radiotherapy to 3 liver metastases 10/10/2012 through 10/20/2012.  1N. Follow-up MRI of the abdomen 11/24/2012 showed improvement in the 3 hepatic metastases and no progressive disease.  1O. Restaging MRI 04/09/2013 showed improvement in the 3 treated hepatic lesions and 2 new lesions. No evidence of metastatic disease outside of liver.  1P. Initiation of Xeloda/Avastin 05/30/2013. Cycle 2 initiated on 06/20/2013 (Avastin placed on hold)-Xeloda discontinued after 10 days secondary to hand/foot syndrome. Cycle 3 Xeloda question initiated on 07/28/2013 (she is unable to recall when she started or stopped cycle 3). Cycle 4 08/20/2013. Cycle 5 09/14/2013 (no Xeloda). Cycle 6 Xeloda/Avastin  10/01/2013. Cycle 7 Xeloda/Avastin 11/02/2013  1Q. restaging MRI of the liver 11/16/2013-3  enhancing right liver lesion, slightly larger. No evidence of metastasis in the left hepatic lobe.  1R. Xeloda/Avastin continued.  1S. restaging PET scan 01/28/2014 showed 2 hypermetabolic lesions in the liver corresponding to the abnormality seen on the recent MRI. The third lesion seen on the recent MRI was not discernible on the PET. No other evidence for hypermetabolic metastases.  [name] continued.  1U. CT abdomen/pelvis 06/14/2014 with new moderate ascites; large infiltrating lesions again noted within the right hepatic lobe difficult to fully assess on CT. Mildly increased hepatic atrophy with regard to the 2 larger lesions. A smaller lobulated lesion had increased mildly in size. 1V. CT chest/abdomen 09/09/2014 with progression of multifocal hepatic metastases. No evidence of metastatic disease in the chest. 1W. Initiation of 5-fluorouracil/Panitumumab on a 2 week schedule 09/10/2014. 1X. CT 01/20/2015 with likely mild improvement in multifocal hepatic metastases. 1Y. Continuation of 5-FU/Panitumumab on a 3 week schedule  1Z.CT 07/07/2015. No evidence for metastatic disease in the chest. Multifocal liver metastases without substantial change. Clear and marked interval progression of ascites. Interval development/worsening of body wall edema. Stable borderline para-aortic lymphadenopathy.  1. Diabetes. 2. History of proteinuria secondary to Avastin versus diabetes. 3. Mucositis and hand foot syndrome, secondary to 5FU improved with a dose reduction of the 5-FU. 4. 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 Left back and left leg pain with an MRI of the lumbar spine confirming lumbar disk disease. She is status post surgery by Dr. Christella Noa 02/14/2008 with resolution of the back and left leg pain. 6. Rosacea. She is followed by dermatology.  7. Depression, maintained on Cymbalta 8. History of bilateral knee pain secondary to arthritis -- she takes  Vicodin as needed. 9. History of anorexia -- weight loss -- ?Related to depression/anxiety or metastatic colon cancer. 10. Allergic reaction to oxaliplatin with FOLFOX on 04/13/2011. She was able to tolerate FOLFOX on 04/20/2011 with steroid premedication and a prolonged oxaliplatin infusion. After cycle #8 she developed a rash upon returning home with pruritus. She took Benadryl with resolution of symptoms. This reaction occurred despite steroid premedication. 11. Poor dentition -- she was evaluated by Dr. Enrique Sack. She underwent tooth extractions 07/04/2013. She now has dentures.  12. Neutropenia following cycle 1 of FOLFIRI. The FOLFIRI was dose reduced beginning with cycle 2. She was neutropenic 03/14/2012 and chemotherapy was held for one week.  13. Nausea and diarrhea during the irinotecan infusion on 03/21/2012. Symptoms resolved with atropine. She received atropine as a premedication with each cycle. 14. Diarrhea following the 05/09/2012 chemotherapy cycle. Likely related to irinotecan. She takes Imodium as needed. 15. History of diffuse pain 4-5 days after chemotherapy 07/17/2012-likely toxicity from Neulasta. 16. History of a fall with fracture of the right fifth metacarpal. 17. Hand/foot syndrome secondary to Xeloda-the Xeloda was dose reduced beginning with cycle 3 on 07/23/2013 (?). 18. Hypomagnesemia secondary to Panitumumab. 19. Anemia, multifactorial likely secondary to renal insufficiency, chronic disease and chemotherapy. Trial of weekly Procrit initiated 02/12/2015. 03/26/2015 hemoglobin unchanged and Procrit discontinued. 20. Ascites-status post a therapeutic paracentesis 03/28/2015 and 07/10/2015, negative cytology; 08/13/2015, negative cytology; 09/16/2015, negative cytology.   insertion of peritoneal drainage catheter 10/14/2015 21. Renal failure 22. Cirrhosis     Disposition:   Ms. Lanese has metastatic colon cancer. She has multiple cold morbid conditions including  renal failure and cirrhosis. He is not a candidate for further systemic therapy. I discussed the situation  with her daughter today. I recommend continued  Hospice care. She will discontinue Claritin , vesicare, and amlodipine. The hospice RN will flush the Port-A-Cath. We will ask the hospice team to drain the peritoneal catheter on an as-needed basis.   Ms. Howse will return for an office visit in one month.  Betsy Coder, MD  12/15/2015  4:27 PM

## 2015-12-19 ENCOUNTER — Telehealth: Payer: Self-pay | Admitting: *Deleted

## 2015-12-19 NOTE — Telephone Encounter (Signed)
Call from Brandon Melnick RN with hospice with an update. She drained 2 liters from pt today. 1 liter on Monday. Pt seems more lethargic lately. Pt fell twice on Monday- no injury. She has done teaching with the family re: safety and when to call hospice or 911 non-emergency for help getting pt up if needed.  Pt has no needs at this time per [facility].

## 2015-12-22 ENCOUNTER — Telehealth: Payer: Self-pay | Admitting: *Deleted

## 2015-12-22 NOTE — Telephone Encounter (Signed)
Maura, Hospice RN at pt home now report "pt found early this am by family on floor, incontinent with purse under head for pillow"  Reports pt BP 116/62, P-61-81 pulse ox 90%; pt more confused; color "ashen" hand/feet cold to touch, increased trembling; Maura states pt may be "transitioning; body shutting down"  Social worker also in home at this time talking to daughter on phone; pt still full code; daughter asking about "feeding tube"  Daughter through [employer], museum asking MD/Lisa to call her.  Dr. Benay Spice made aware of all information. Notified Maura that MD said if family wants to take her to ED for work-up that's fine or she can be admitted to [facility] place for comfort care if family not able to care for her at home.  Cristela Blue, RN verbalized understanding of MD instructions.

## 2015-12-25 ENCOUNTER — Telehealth: Payer: Self-pay | Admitting: *Deleted

## 2015-12-25 NOTE — Telephone Encounter (Signed)
Update from [place], Cristela Blue. Pt is no longer ambulating. Gets up in wheelchair. Weaker and lethargic, did not interact with nurse during visit today. Cristela Blue will keep [place] updated.

## 2015-12-31 ENCOUNTER — Telehealth: Payer: Self-pay | Admitting: *Deleted

## 2015-12-31 NOTE — Telephone Encounter (Signed)
Call from Brandon Melnick, Hospice RN with an update. Hospice nurse continues to drain peritoneal catheter twice weekly. Pt is wearing O2. Having increased L abdominal pain. Now taking half tab of Vicodin PRN. Pt is increasingly confused. Getting up unassisted, up several times at night to use the restroom.  Family is requesting a Foley. Family also requesting a sleep aid/ sedative.  Will review with Dr. Benay Spice for orders.

## 2015-12-31 NOTE — Telephone Encounter (Signed)
Message from pt's daughter requesting to speak with "Dr. Benay Spice himself, not a nurse or assistant." Will forward request to MD.

## 2016-01-01 ENCOUNTER — Telehealth: Payer: Self-pay | Admitting: *Deleted

## 2016-01-01 NOTE — Telephone Encounter (Signed)
Discussed with daughter Please call Emily Lawson, plan to continue draining peritoneal fluid prn

## 2016-01-01 NOTE — Telephone Encounter (Signed)
Emily Melnick, RN called from Hospice. Pt's daughter, Donnald Garre is requesting a call from Dr Benay Spice.   Pt has a lab appt next week and Ned Card, NP.   Cristela Blue reports patient is more withdrawn and lethargic. Was having increased restlessness at night, so they are giving lorazepam .5mg  at bedtime.

## 2016-01-02 MED ORDER — TRAMADOL HCL 50 MG PO TABS: 50.0000 mg | ORAL_TABLET | Freq: Four times a day (QID) | ORAL | Status: DC | PRN

## 2016-01-02 NOTE — Addendum Note (Signed)
Addended by: Brien Few on: 01/02/2016 03:19 PM   Modules accepted: Orders, Medications

## 2016-01-02 NOTE — Telephone Encounter (Addendum)
Spoke with Cristela Blue, Hospice RN. Made her aware of order to continue draining peritoneal fluid PRN. She voiced understanding, drained 1L dark fluid today. Pt is pale, axillary temp 96. SpO2 dropped to 84% on room air. 98% on 2L. Per Cristela Blue, pt's daughter requests Tramadol refill to use during the day, Vicodin makes her sleepy. Contraindication noted with Cymbalta and Tramadol. Per Dr. Benay Spice: DC Cymbalta.

## 2016-01-05 ENCOUNTER — Telehealth: Payer: Self-pay | Admitting: *Deleted

## 2016-01-05 NOTE — Telephone Encounter (Signed)
Pt's daughter Ramona left message "I want someone to call me back about her medications; they stopped her Cymbalta and she has been on that forever and started her on Ativan and I want that changed; the hospice doctor said you couldn't just stop cymbalta"  Spoke with Ramona; she states that she started pt back on Cymbalta.  Clarified with her that there was a contraindication for giving Tramadol and Cymbalta and that was reason it was discontinued.  Per Pharmacy & Dr. Benay Spice; Faythe Ghee to continue Cymbalta and give pt Vicodin as needed for pain; also continue Ativan as needed for anxiety.  Ramona verbalized understanding and confirmed appt 2/16 with MD

## 2016-01-09 ENCOUNTER — Telehealth: Payer: Self-pay | Admitting: *Deleted

## 2016-01-09 NOTE — Telephone Encounter (Signed)
Cristela Blue, RN [facility] left message with update " I have seen pt twice this week and drawn off 2 liters today; pt is increasingly confused, lethargic, weak; RR-12, pulse OK, O2 sat 92%, axillary temp 95.1; today confused in many, many ways and has signs of liver failure"  Dr. Benay Spice made aware of all information.

## 2016-01-12 ENCOUNTER — Telehealth: Payer: Self-pay | Admitting: *Deleted

## 2016-01-12 NOTE — Telephone Encounter (Signed)
Message from [place], Hospice RN reporting pt seems more confused and cognitively slower today. BP today 98/60, heart rate irregular. Hospice RN drained 1L peritoneal fluid today. Family reported pt got out of bed once over the weekend. Barely eating. Pt is agitated and restless at night.  Pt has an office visit scheduled 2/16. As it currently stands, family is planning to keep that appointment. Update forwarded to Dr. Benay Spice.

## 2016-01-15 ENCOUNTER — Other Ambulatory Visit (HOSPITAL_BASED_OUTPATIENT_CLINIC_OR_DEPARTMENT_OTHER): Payer: PPO

## 2016-01-15 ENCOUNTER — Ambulatory Visit (HOSPITAL_BASED_OUTPATIENT_CLINIC_OR_DEPARTMENT_OTHER): Payer: PPO | Admitting: Nurse Practitioner

## 2016-01-15 VITALS — BP 105/49 | HR 72 | Resp 16 | Ht 61.0 in

## 2016-01-15 DIAGNOSIS — E119 Type 2 diabetes mellitus without complications: Secondary | ICD-10-CM

## 2016-01-15 DIAGNOSIS — C189 Malignant neoplasm of colon, unspecified: Secondary | ICD-10-CM | POA: Diagnosis not present

## 2016-01-15 DIAGNOSIS — N19 Unspecified kidney failure: Secondary | ICD-10-CM

## 2016-01-15 DIAGNOSIS — C787 Secondary malignant neoplasm of liver and intrahepatic bile duct: Secondary | ICD-10-CM | POA: Diagnosis not present

## 2016-01-15 DIAGNOSIS — K7460 Unspecified cirrhosis of liver: Secondary | ICD-10-CM | POA: Diagnosis not present

## 2016-01-15 LAB — CBC WITH DIFFERENTIAL/PLATELET
BASO%: 1 % (ref 0.0–2.0)
Basophils Absolute: 0.1 10*3/uL (ref 0.0–0.1)
EOS%: 1.2 % (ref 0.0–7.0)
Eosinophils Absolute: 0.1 10*3/uL (ref 0.0–0.5)
HEMATOCRIT: 34.3 % — AB (ref 34.8–46.6)
HEMOGLOBIN: 11.4 g/dL — AB (ref 11.6–15.9)
LYMPH#: 1.3 10*3/uL (ref 0.9–3.3)
LYMPH%: 12.8 % — ABNORMAL LOW (ref 14.0–49.7)
MCH: 32.6 pg (ref 25.1–34.0)
MCHC: 33.2 g/dL (ref 31.5–36.0)
MCV: 98.4 fL (ref 79.5–101.0)
MONO#: 0.5 10*3/uL (ref 0.1–0.9)
MONO%: 4.9 % (ref 0.0–14.0)
NEUT%: 80.1 % — ABNORMAL HIGH (ref 38.4–76.8)
NEUTROS ABS: 8.3 10*3/uL — AB (ref 1.5–6.5)
Platelets: 323 10*3/uL (ref 145–400)
RBC: 3.49 10*6/uL — ABNORMAL LOW (ref 3.70–5.45)
RDW: 17.3 % — AB (ref 11.2–14.5)
WBC: 10.3 10*3/uL (ref 3.9–10.3)

## 2016-01-15 LAB — BASIC METABOLIC PANEL
ANION GAP: 12 meq/L — AB (ref 3–11)
BUN: 70 mg/dL — ABNORMAL HIGH (ref 7.0–26.0)
CO2: 24 mEq/L (ref 22–29)
Calcium: 9 mg/dL (ref 8.4–10.4)
Chloride: 98 mEq/L (ref 98–109)
Creatinine: 2.9 mg/dL — ABNORMAL HIGH (ref 0.6–1.1)
EGFR: 15 mL/min/{1.73_m2} — AB (ref >90)
GLUCOSE: 156 mg/dL — AB (ref 70–140)
POTASSIUM: 3.6 meq/L (ref 3.5–5.1)
Sodium: 133 mEq/L — ABNORMAL LOW (ref 136–145)

## 2016-01-15 MED ORDER — HYDROCODONE-ACETAMINOPHEN 10-325 MG PO TABS: 0.5000 | ORAL_TABLET | Freq: Four times a day (QID) | ORAL | Status: DC | PRN

## 2016-01-15 MED ORDER — HYDROCODONE-ACETAMINOPHEN 7.5-325 MG/15ML PO SOLN: 15.0000 mL | Freq: Four times a day (QID) | ORAL | Status: AC | PRN

## 2016-01-15 NOTE — Progress Notes (Addendum)
[facility] OFFICE PROGRESS NOTE   Diagnosis:  Colon cancer  INTERVAL HISTORY:   Emily Lawson returns as scheduled. She is followed by hospice. Appetite is poor. She remains in bed much of the day. Bowels are moving. The peritoneal catheter is being drained twice a week. She denies significant abdominal pain. She reports generalized back pain. Her family attributes this to laying in bed.  Objective:  Vital signs in last 24 hours:  Blood pressure 105/49, pulse 72, resp. rate 16, height 5' 1"  (1.549 m).    HEENT: No thrush. Resp: Lungs clear bilaterally. Cardio: Regular rate and rhythm. GI: Abdomen is soft. Right lower quadrant peritoneal catheter covered with a gauze dressing. Vascular: Trace lower leg edema bilaterally. Neuro: Alert. Follows commands.  Port-A-Cath without erythema.   Lab Results:  Lab Results  Component Value Date   WBC 10.3 01/15/2016   HGB 11.4* 01/15/2016   HCT 34.3* 01/15/2016   MCV 98.4 01/15/2016   PLT 323 01/15/2016   NEUTROABS 8.3* 01/15/2016    Imaging:  No results found.  Medications: I have reviewed the patient's current medications.  Assessment/Plan: 1. Metastatic colon cancer presenting with synchronous colon primaries in September, 2008, status post a hemicolectomy and liver biopsy 08/03/2007. A CT on 04/20/2010 revealed no evidence metastatic disease. She was maintained on 5-FU/leucovorin per the FOLFOX regimen beginning in April, 2009. Avasta was discontinued from the chemotherapy regimen beginning in November, 2009 due to nephrotic range proteinuria. A CT of the abdomen and pelvis 02/05/2011 revealed evidence of three small liver lesions. An MRI of the abdomen on 03/02/2011 confirmed 3 liver lesions and no additional evidence of metastatic disease. K-RAS testing on the transverse colon and left colon tumors revealed a wild type genotype. Foundation 1 testing revealed no K-ras, NRAS, or BRAF mutation. 1A. Staging PET scan  03/21/2011 confirmed 3 hypermetabolic liver lesions and no additional evidence of metastatic disease.  1B. Surgical consultation by Dr. Barry Dienes was obtained and Emily Lawson decided against an attempt at surgical resection/radiofrequency ablation.  1C. Salvage therapy with FOLFOX was initiated 04/13/2011.  1D. A restaging CT 06/28/2011 revealed a significant improvement in the hepatic metastasis.  1E. She completed eight cycles of FOLFOX chemotherapy. Treatment was switched to 5FU-leucovorin as per the FOLFOX regimen beginning with cycle #9 due to an allergic reaction (skin rash) following cycle #8.  75F. Restaging CT 10/25/2011 revealed 2 more conspicuous liver metastases .  1G. Restaging PET scan 12/24/2011 revealed an increase in the size and metabolic activity of 3 liver metastases  1H. She began FOLFIRI chemotherapy on 01/19/2012.  1 I. Restaging CT of the abdomen 05/08/2012 revealed a mixed response with enlargement of 2 liver lesions and a decrease in the size of a third lesion, no new lesions.  1J. FOLFIRI chemotherapy continued with the addition of Avastin beginning 05/09/2012.  1K. restaging CT 07/28/2012 with no new liver lesions and a decreased size of the previously noted 3 liver lesions.  1L. She was last treated with FOLFIRI/Avastin on 09/12/2012.  56M. She completed stereotactic radiotherapy to 3 liver metastases 10/10/2012 through 10/20/2012.  1N. Follow-up MRI of the abdomen 11/24/2012 showed improvement in the 3 hepatic metastases and no progressive disease.  1O. Restaging MRI 04/09/2013 showed improvement in the 3 treated hepatic lesions and 2 new lesions. No evidence of metastatic disease outside of liver.  1P. Initiation of Xeloda/Avastin 05/30/2013. Cycle 2 initiated on 06/20/2013 (Avastin placed on hold)-Xeloda discontinued after 10 days secondary to hand/foot syndrome. Cycle  3 Xeloda question initiated on 07/28/2013 (she is unable to recall when she started or stopped  cycle 3). Cycle 4 08/20/2013. Cycle 5 09/14/2013 (no Xeloda). Cycle 6 Xeloda/Avastin 10/01/2013. Cycle 7 Xeloda/Avastin 11/02/2013  1Q. restaging MRI of the liver 11/16/2013-3 enhancing right liver lesion, slightly larger. No evidence of metastasis in the left hepatic lobe.  1R. Xeloda/Avastin continued.  1S. restaging PET scan 01/28/2014 showed 2 hypermetabolic lesions in the liver corresponding to the abnormality seen on the recent MRI. The third lesion seen on the recent MRI was not discernible on the PET. No other evidence for hypermetabolic metastases.  Emily Lawson continued.  1U. CT abdomen/pelvis 06/14/2014 with new moderate ascites; large infiltrating lesions again noted within the right hepatic lobe difficult to fully assess on CT. Mildly increased hepatic atrophy with regard to the 2 larger lesions. A smaller lobulated lesion had increased mildly in size. 1V. CT chest/abdomen 09/09/2014 with progression of multifocal hepatic metastases. No evidence of metastatic disease in the chest. 1W. Initiation of 5-fluorouracil/Panitumumab on a 2 week schedule 09/10/2014. 1X. CT 01/20/2015 with likely mild improvement in multifocal hepatic metastases. 1Y. Continuation of 5-FU/Panitumumab on a 3 week schedule  1Z.CT 07/07/2015. No evidence for metastatic disease in the chest. Multifocal liver metastases without substantial change. Clear and marked interval progression of ascites. Interval development/worsening of body wall edema. Stable borderline para-aortic lymphadenopathy.  1. Diabetes. 2. History of proteinuria secondary to Avastin versus diabetes. 3. Mucositis and hand foot syndrome, secondary to 5FU improved with a dose reduction of the 5-FU. 4. 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 Left back and left leg pain with an MRI of the lumbar spine confirming lumbar disk disease. She is status post surgery by Dr. Christella Noa 02/14/2008 with resolution of the back  and left leg pain. 6. Rosacea. She is followed by dermatology.  7. Depression, maintained on Cymbalta 8. History of bilateral knee pain secondary to arthritis -- she takes Vicodin as needed. 9. History of anorexia -- weight loss -- ?Related to depression/anxiety or metastatic colon cancer. 10. Allergic reaction to oxaliplatin with FOLFOX on 04/13/2011. She was able to tolerate FOLFOX on 04/20/2011 with steroid premedication and a prolonged oxaliplatin infusion. After cycle #8 she developed a rash upon returning home with pruritus. She took Benadryl with resolution of symptoms. This reaction occurred despite steroid premedication. 11. Poor dentition -- she was evaluated by Dr. Enrique Sack. She underwent tooth extractions 07/04/2013. She now has dentures.  12. Neutropenia following cycle 1 of FOLFIRI. The FOLFIRI was dose reduced beginning with cycle 2. She was neutropenic 03/14/2012 and chemotherapy was held for one week.  13. Nausea and diarrhea during the irinotecan infusion on 03/21/2012. Symptoms resolved with atropine. She received atropine as a premedication with each cycle. 14. Diarrhea following the 05/09/2012 chemotherapy cycle. Likely related to irinotecan. She takes Imodium as needed. 15. History of diffuse pain 4-5 days after chemotherapy 07/17/2012-likely toxicity from Neulasta. 16. History of a fall with fracture of the right fifth metacarpal. 17. Hand/foot syndrome secondary to Xeloda-the Xeloda was dose reduced beginning with cycle 3 on 07/23/2013 (?). 18. Hypomagnesemia secondary to Panitumumab. 19. Anemia, multifactorial likely secondary to renal insufficiency, chronic disease and chemotherapy. Trial of weekly Procrit initiated 02/12/2015. 03/26/2015 hemoglobin unchanged and Procrit discontinued. 20. Ascites-status post a therapeutic paracentesis 03/28/2015 and 07/10/2015, negative cytology; 08/13/2015, negative cytology; 09/16/2015, negative cytology.  insertion of peritoneal  drainage catheter 10/14/2015 21. Renal failure 22. Cirrhosis     Disposition: Emily Lawson has metastatic  colon cancer. Her performance status continues to decline. She is followed by hospice. Dr. Benay Spice discussed CODE STATUS/end-of-life issues with Emily Lawson son and daughter. They are in agreement with NO CODE BLUE status.  She is not having significant abdominal pain and does not have significant distention on exam today. We will change the peritoneal drainage to be performed on an as-needed basis.  Emily Lawson family did not wish to schedule a formal follow-up visit. They will contact us as needed.  Patient seen with Dr. Benay Spice.    Ned Card ANP/GNP-BC   01/15/2016  2:28 PM This was a shared visit with Ned Card. Emily Lawson has declined significantly over the past several weeks. I discussed the prognosis with her family. She appears confused. The family understands her lifespan will likely be measured in weeks. They agree with a no CODE BLUE status. The peritoneal catheter will be trained on an as-needed basis.  Julieanne Manson, M.D.

## 2016-01-16 ENCOUNTER — Telehealth: Payer: Self-pay | Admitting: Nurse Practitioner

## 2016-01-16 NOTE — Telephone Encounter (Signed)
As per Owens Shark NP call placed to Brandon Melnick Rn ([place] ) to inform her that a DNR status was decided by the family of the Pt. Also the peritoneal drainage catheter can be drained as needed. Voice mail message left informed if any questions to return call to [facility].

## 2016-01-16 NOTE — Telephone Encounter (Signed)
-----   Message from Owens Shark, NP sent at 01/15/2016  5:10 PM EST ----- Please call hospice nurse and let her know [name]'s family decided on DO NOT RESUSCITATE status. Also let her know the peritoneal drainage catheter can be drained as needed rather than twice a week.

## 2016-01-20 ENCOUNTER — Telehealth: Payer: Self-pay | Admitting: *Deleted

## 2016-01-20 IMAGING — CT CT CHEST W/ CM
3 series · 16 of 31 positions shown, 18 images · IV contrast (OMNIPAQUE)
Comparison: CT abdomen pelvis dated 06/14/2014. PET-CT dated
01/28/2014.

CLINICAL DATA: Colon cancer with liver metastases. Status post
cholecystectomy, chemotherapy ongoing, XRT complete. Status post
cholecystectomy. Cough, nausea, diarrhea.

EXAM:
CT CHEST AND ABDOMEN WITH CONTRAST
TECHNIQUE: Multidetector CT imaging of the chest and abdomen was performed
following the standard protocol during bolus administration of
intravenous contrast.
CONTRAST:  100mL OMNIPAQUE IOHEXOL 300 MG/ML  SOLN

[Series 2: chest/abd with st · axial · 0.77mm/px · z∈[-381,-206]mm · 5 of 83 slices shown]
[im 7/83  lung]
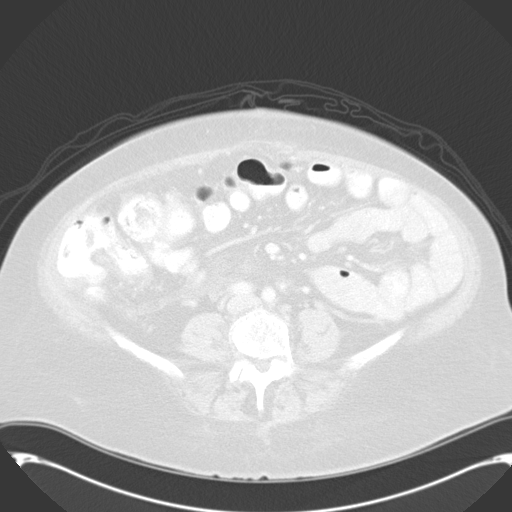
[im 19/83  lung]
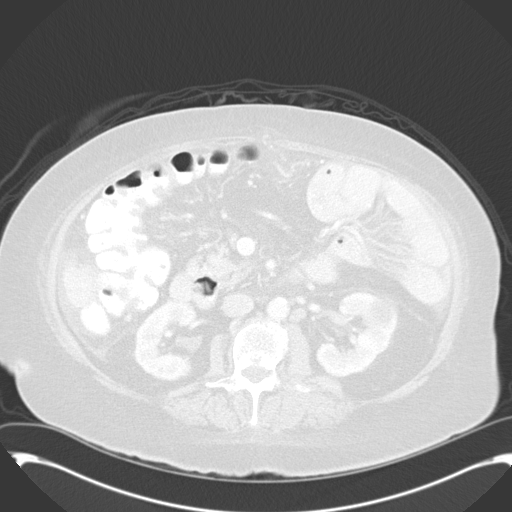
[im 32/83  lung]
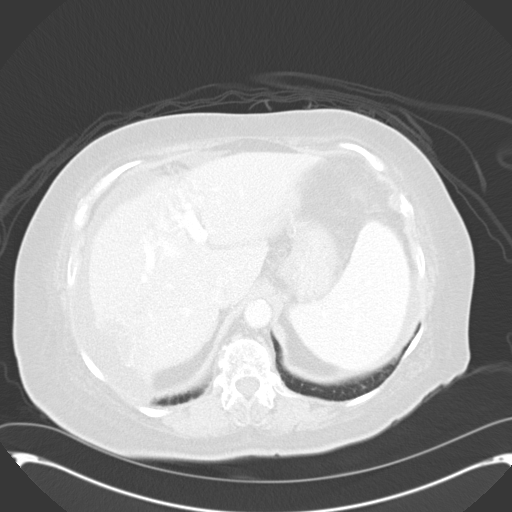
[im 40/83  lung]
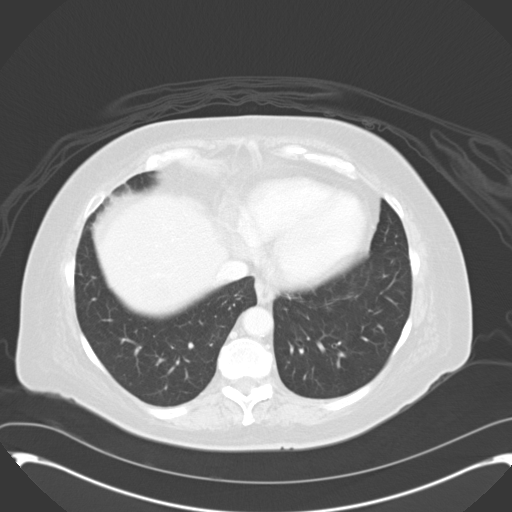
[im 42/83  lung]
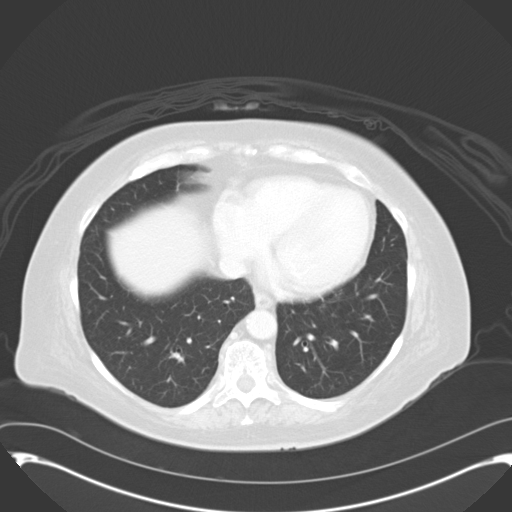

[Series 4: kidney delays · axial · 0.78mm/px · z∈[-340,-270]mm · 3 of 29 slices shown]
[im 8/29  lung]
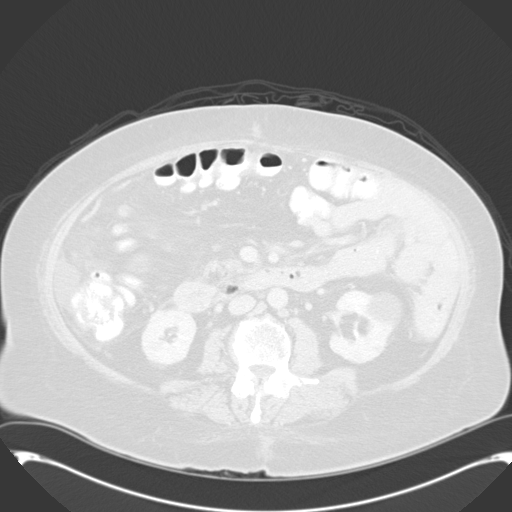
[im 15/29  lung]
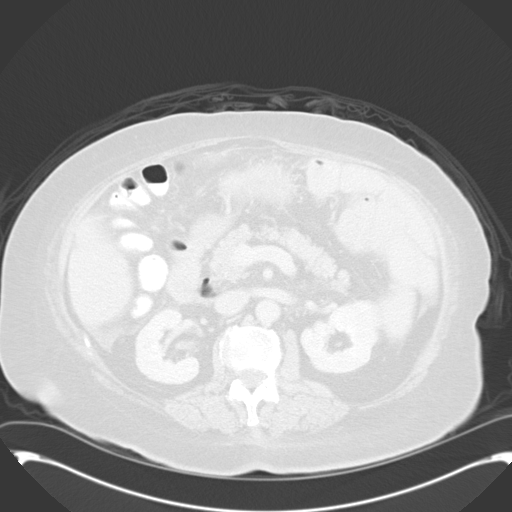
[im 22/29  lung]
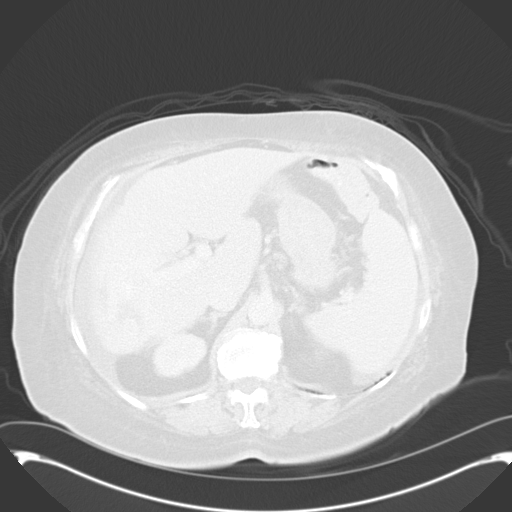

[Series 5: lung windows · axial · 0.77mm/px · z∈[-241,-36]mm · 8 of 55 slices shown, 10 images]
[im 7/55  mediastinal]
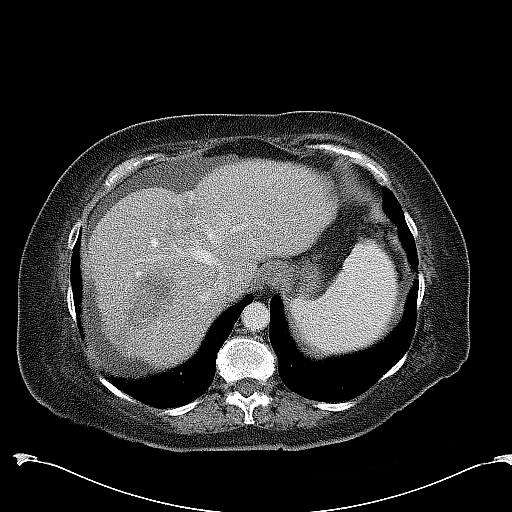
[im 7/55  lung]
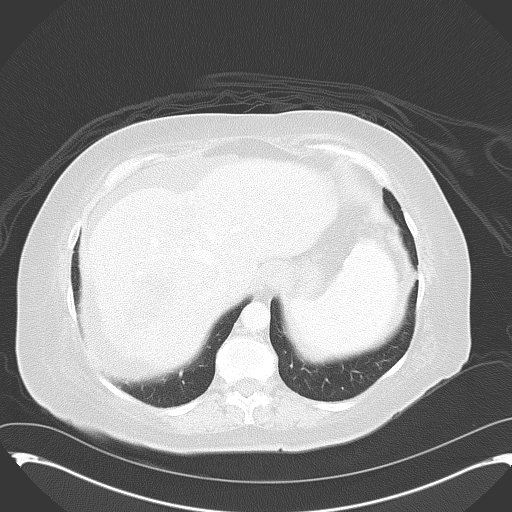
[im 12/55  lung]
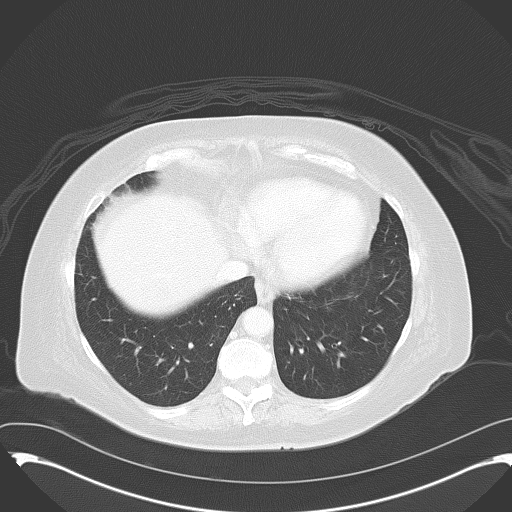
[im 14/55  lung]
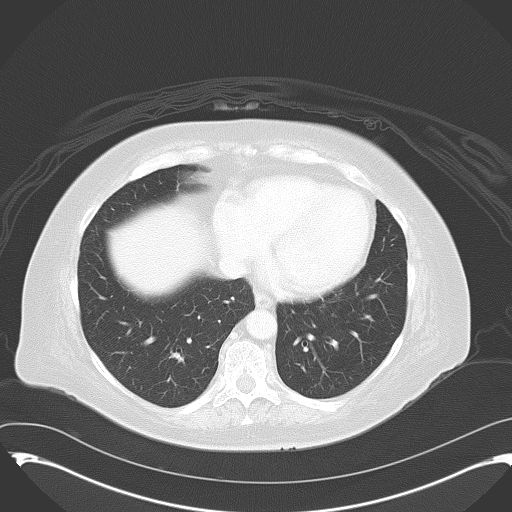
[im 21/55  lung]
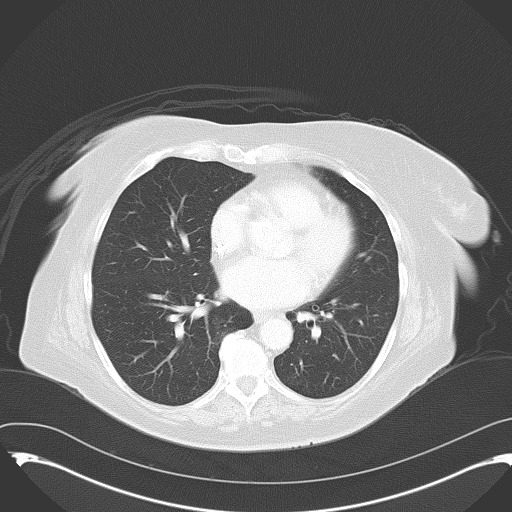
[im 28/55  mediastinal]
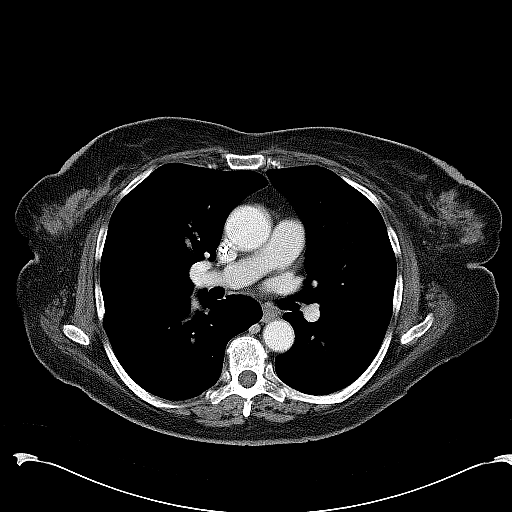
[im 28/55  lung]
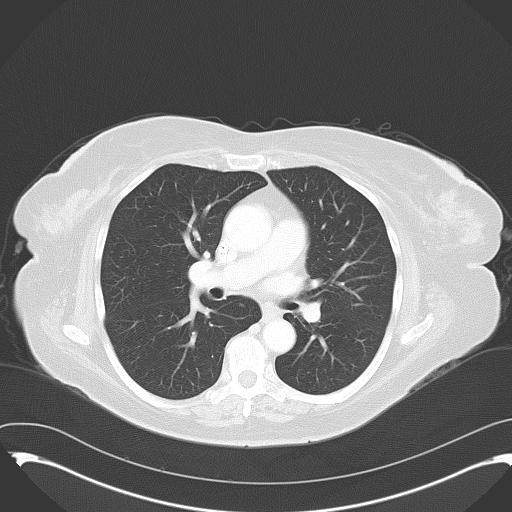
[im 34/55  lung]
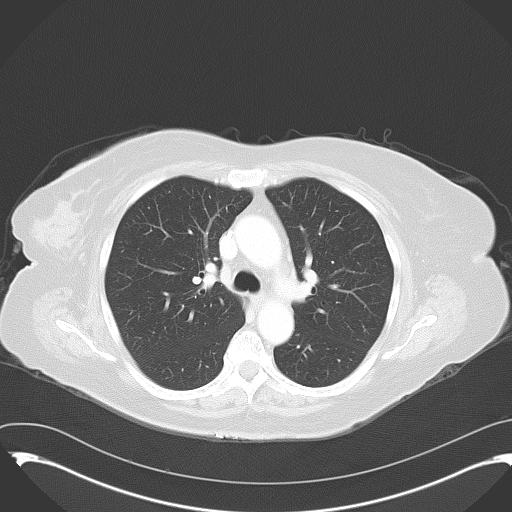
[im 41/55  lung]
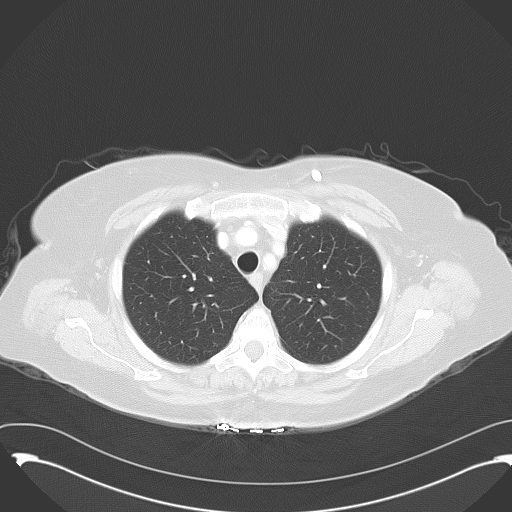
[im 48/55  lung]
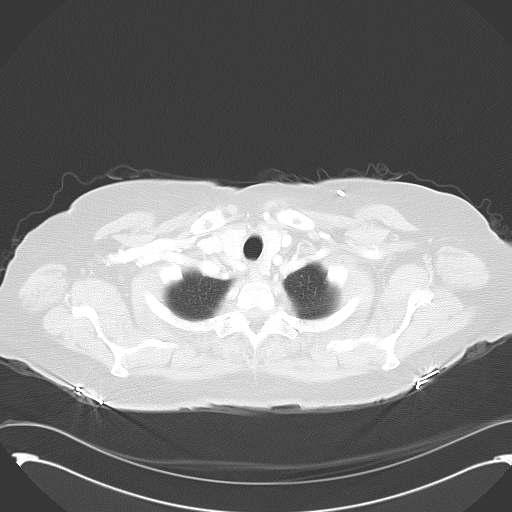

[16 of 31 positions shown; findings below may reference images not displayed]

FINDINGS: CT CHEST FINDINGS

Lungs are clear. No suspicious pulmonary nodules. No pleural
effusion or pneumothorax.

Visualized left thyroid is notable for a dominant 11 mm nodule
(series 2/ image 10).

The heart is normal in size. No pericardial effusion. Coronary
atherosclerosis in the LAD (series 2/ image 31). Mild
atherosclerotic calcifications of the aortic arch. Mild ectasia of
the ascending thoracic aorta measuring up to 3.4 cm.

No suspicious mediastinal, hilar, or axillary lymphadenopathy.

Left chest port terminating at the cavoatrial junction.

Degenerative changes of the thoracic spine.

CT ABDOMEN FINDINGS

Hepatobiliary: 3.3 x 4.7 cm metastasis in the posterior right
hepatic dome (series 2/ image 47), previously 2.5 x 3.5 cm. 3.2 x
1.7 cm subcapsular metastasis in the posterior segment right hepatic
lobe (series 2/image 51), previously 2.4 x 1.8 cm.

Additional ill-defined metastases in the medial segment left hepatic
lobe (series 2/image 51) and inferior right hepatic lobe (series
2/image 36) are similar to the prior but difficult to discretely
measure.

Status post cholecystectomy. No intrahepatic or extrahepatic ductal
dilatation.

Pancreas: Within normal limits.

Spleen: Within normal limits.

Adrenals/Urinary Tract: Adrenal glands are grossly unremarkable.

3.0 cm lateral left lower pole renal cyst (series 2/image 68). Right
kidney is within normal limits. No hydronephrosis.

Stomach/Bowel: Stomach is unremarkable.

Visualized bowel is grossly unremarkable.

Vascular/Lymphatic: No evidence of abdominal aortic aneurysm.

Small retroperitoneal nodes measuring up to 8 mm short axis (series
2/ image 65).

Other: Small to moderate perihepatic ascites, decreased.

Musculoskeletal: Degenerative changes of the visualized lumbar
spine.
IMPRESSION: Progression of multifocal hepatic metastases, as above.

No evidence of metastatic disease in the chest.

## 2016-01-20 NOTE — Telephone Encounter (Signed)
Call received from Cathey Endow RN with [facility] to update Dr. Benay Spice on pt.'s condition.  Pt.'s BP 86/58 and 1 liter of fluid drained from abdomen on 01/19/16.  Pt.'s family is coping well.  Dr. Benay Spice notified and appreciates care by Hospice for Ms. Digeronimo.

## 2016-01-23 ENCOUNTER — Encounter: Payer: Self-pay | Admitting: *Deleted

## 2016-01-23 NOTE — Progress Notes (Signed)
Call received from [facility] with [facility] to inform Dr. Benay Spice that pt is continuing to decline and that family is coping well.

## 2016-01-26 ENCOUNTER — Telehealth: Payer: Self-pay | Admitting: *Deleted

## 2016-01-26 NOTE — Telephone Encounter (Signed)
Call from Brandon Melnick, RN with hospice reporting pt seems to be "transitioning." BP 76/44 today. Pt is withdrawn, "coarse nystagmus noted." Pt seems to be restless. Cristela Blue is educating family about medicating pt for restlessness. Pt verbalizes that she is not in pain but answers yes when asked if she feels restless. Additional teaching provided to daughter Ramona after asking if pt should be taken to the hospital.  Dr. Benay Spice made aware of above.

## 2016-02-02 ENCOUNTER — Other Ambulatory Visit: Payer: Self-pay | Admitting: Oncology

## 2016-02-04 ENCOUNTER — Telehealth: Payer: Self-pay | Admitting: *Deleted

## 2016-02-04 ENCOUNTER — Other Ambulatory Visit: Payer: Self-pay | Admitting: Oncology

## 2016-02-04 DIAGNOSIS — C189 Malignant neoplasm of colon, unspecified: Secondary | ICD-10-CM

## 2016-02-04 NOTE — Telephone Encounter (Signed)
Message from [place], Hospice RN: Pt continues to decline. Incontinent, weak, continues to have coarse nystagmus. Pt having difficulty swallowing, daughter is reluctant to stop potassium and lasix since she is still being drained. Pt continues to be agitated, daughter is hesitant to "sedate mom." Spoke with pt's daughter, she reports pt is still restless at night. Taking Ativan 0.5 mg which does not seem to help. Pt becomes calm if daughter sits with her, holds her hand and talks to her. She is asking if she should try something else for the agitation. Instructed her to try to increase Ativan to 1 mg at night for sleep, per Dr. Benay Spice. She agrees to try this.

## 2016-02-10 ENCOUNTER — Encounter: Payer: Self-pay | Admitting: Oncology

## 2016-02-10 NOTE — Progress Notes (Signed)
form left in pod °

## 2016-02-10 NOTE — Progress Notes (Signed)
form recd via mail.-not pod

## 2016-02-10 NOTE — Progress Notes (Signed)
forms left on dr desk. I let daughter know dr not here 3/14 but will fax once he signs

## 2016-02-11 ENCOUNTER — Encounter: Payer: Self-pay | Admitting: Oncology

## 2016-02-11 ENCOUNTER — Other Ambulatory Visit: Payer: Self-pay | Admitting: *Deleted

## 2016-02-11 MED ORDER — LACTULOSE 10 GM/15ML PO SOLN: 10.0000 g | Freq: Two times a day (BID) | ORAL | Status: AC | PRN

## 2016-02-11 NOTE — Progress Notes (Signed)
Faxed [facility] [phone] and made copy for ramona.

## 2016-02-11 NOTE — Progress Notes (Signed)
Ramona said ok to mail to patient's address. I advised was faxed to [facility].

## 2016-02-16 ENCOUNTER — Encounter: Payer: Self-pay | Admitting: Oncology

## 2016-02-16 ENCOUNTER — Telehealth: Payer: Self-pay | Admitting: *Deleted

## 2016-02-16 NOTE — Telephone Encounter (Signed)
Received vm call from Emily Lawson stating that she needs Dr Benay Spice to call her ASAP b/c she is thinking about putting her mom on morphine & doesn't know what to do & would like Dr Gearldine Shown advice.  She states that the Hospice RN is to come out today.  Message routed to Dr Blinda Leatherwood

## 2016-02-16 NOTE — Progress Notes (Signed)
I spoke with Emily Lawson and she gave me fax# for [employer] [phone]. They said they didn't get it. I advised got confirmation 02/11/16 [phone]- I will call to confirm recd once confirmation recd from [facility] [address] [place] [place]

## 2016-02-16 NOTE — Telephone Encounter (Signed)
Maura returned call, wanted to make Dr. Benay Spice aware that there has been an issue with drug diversion in the home. Ramona reports she has to keep meds locked away from her sister Elane Fritz. Ramona stated to hospice RN: I will not give oral morphine. Hospice RN discussed with Dr. Lyman Speller, who agrees with low dose continuous IV narcotic in this situation.  Reviewed with Dr. Benay Spice: Hospice MD to dose Morphine. Cristela Blue will contact Dr. Lyman Speller for orders.

## 2016-02-16 NOTE — Telephone Encounter (Signed)
Returned call to [place], Hospice RN Cristela Blue was in the home. Ramona reports pt has stopped eating, unable to drink. Liquids "gurgle in her throat" if she gets a sip in. She no longer feels comfortable administering oral pain med. Daughter reports pt moans a lot- like she is in pain. Stops moaning for about an hour after taking Hydrocodone.  Maura began teaching about Roxanol administration, explained it would be a small amount of med that can be placed in the side of her cheek with a dropper. Ramona does not feel comfortable with this, requesting Morphine continuous infusion. Maura reports pt's pain does not seem severe but she appears uncomfortable. "In a constant state of motion." Reviewed with Dr. Benay Spice: Requests hospice MD evaluate pt and recommend pain med. Maura notified, she will contact Hospice MD.

## 2016-02-17 ENCOUNTER — Telehealth: Payer: Self-pay | Admitting: *Deleted

## 2016-02-17 NOTE — Telephone Encounter (Signed)
Message from [facility] reporting Dr. Lyman Speller is out of the office today. Hospice MD team does not feel comfortable prescribing continuous Morphine drip in his absence. They question medical necessity.She also reports pt is itching/ scratching. Lip smacking and clenching gums when they try to administer oral meds. Emily Lawson asks if they should increase her Ativan or Atarax? Reviewed with Dr. Benay Spice: If pt is otherwise unresponsive, hold off on increasing any meds. Continue Hydrocodone for now if pt is in obvious pain. Dr. Benay Spice will defer decision on continuous narcotic infusion to hospice MD.

## 2016-02-18 ENCOUNTER — Telehealth: Payer: Self-pay | Admitting: *Deleted

## 2016-02-18 MED ORDER — FENTANYL 12 MCG/HR TD PT72: 12.5000 ug | MEDICATED_PATCH | TRANSDERMAL | Status: AC

## 2016-02-18 NOTE — Telephone Encounter (Signed)
Message from Brandon Melnick, Hospice RN: Team meeting today, Hospice MD does not recommend morphine continuous infusion. Per Cristela Blue, Dr. Lyman Speller recommends Fentanyl 12.5 mcg patch. RN reports pt had dry heaves after last Hydrocodone dose. Reviewed with Dr. Benay Spice: Order received for Fentanyl 12.5 mcg patch. (Lowest dose available.) Rx faxed to [facility]. Maura requests order to cancel port flushes (due this week). Verbal order given, per Dr. Benay Spice.

## 2016-02-20 ENCOUNTER — Telehealth: Payer: Self-pay | Admitting: *Deleted

## 2016-02-20 NOTE — Telephone Encounter (Signed)
Update from Brandon Melnick, Hospice RN reporting pt is actively dying. Pt is mostly unresponsive until they move her- when moved/ repositioned she screams out in pain. Daughter was still giving Hydrocodone for breakthrough pain but pt is not swallowing. Ramona agreed to liquid morphine today. Hospice MD added liquid morphine 20mg / mL to her pain regimen. Respirations about 7-8/ min. Hospice will see pt daily.

## 2016-02-23 ENCOUNTER — Telehealth: Payer: Self-pay | Admitting: *Deleted

## 2016-02-23 NOTE — Telephone Encounter (Signed)
Message from [place], [facility] RN, reporting pt expired on 02/06/2016. Dr. Benay Spice made aware.

## 2016-02-28 DEATH — deceased

## 2016-08-07 IMAGING — US US PARACENTESIS
1 series · 8 of 8 positions shown · non-contrast
Comparison: None.

MEDICATIONS:
None.

COMPLICATIONS:
None immediate

INDICATION: Metastatic colon cancer, abdominal distention, ascites. Request is
made for diagnostic and therapeutic paracentesis.

EXAM:
ULTRASOUND-GUIDED DIAGNOSTIC AND THERAPEUTIC PARACENTESIS
TECHNIQUE: Informed written consent was obtained from the patient after a
discussion of the risks, benefits and alternatives to treatment. A
timeout was performed prior to the initiation of the procedure.

[Series 1: us paracentesis · 0.24mm/px · 8 of 8 slices shown]
[im 1/8]
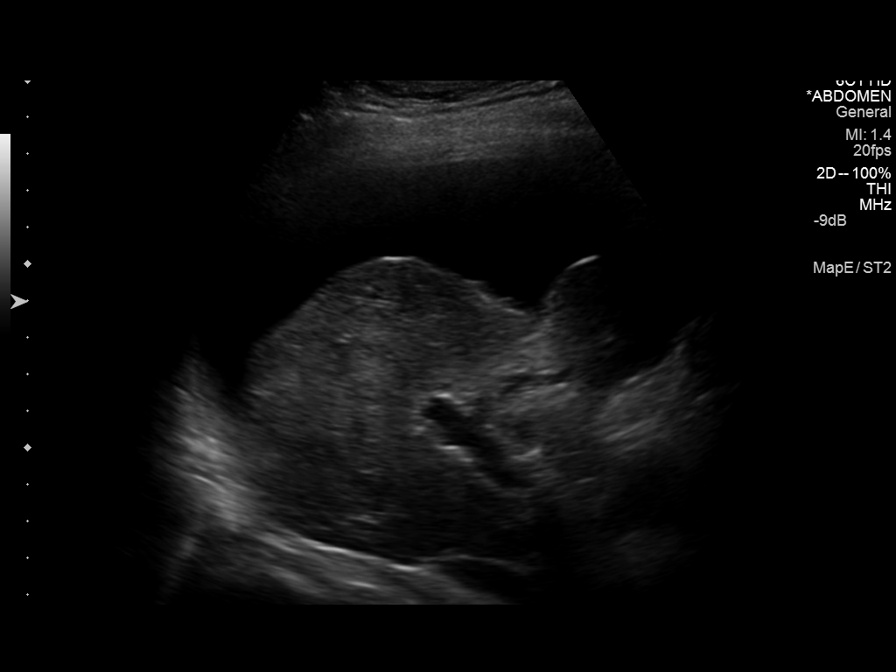
[im 2/8]
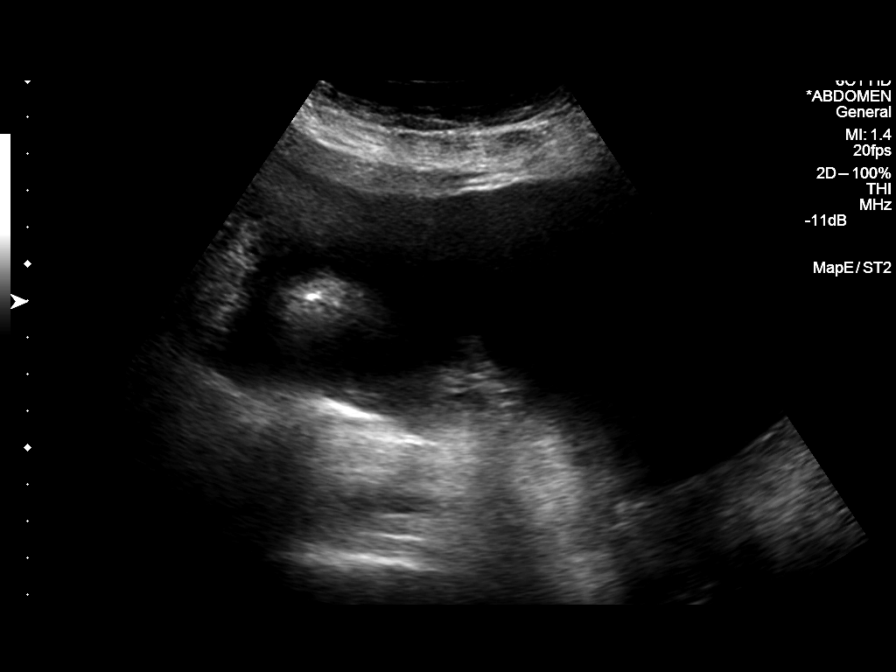
[im 3/8]
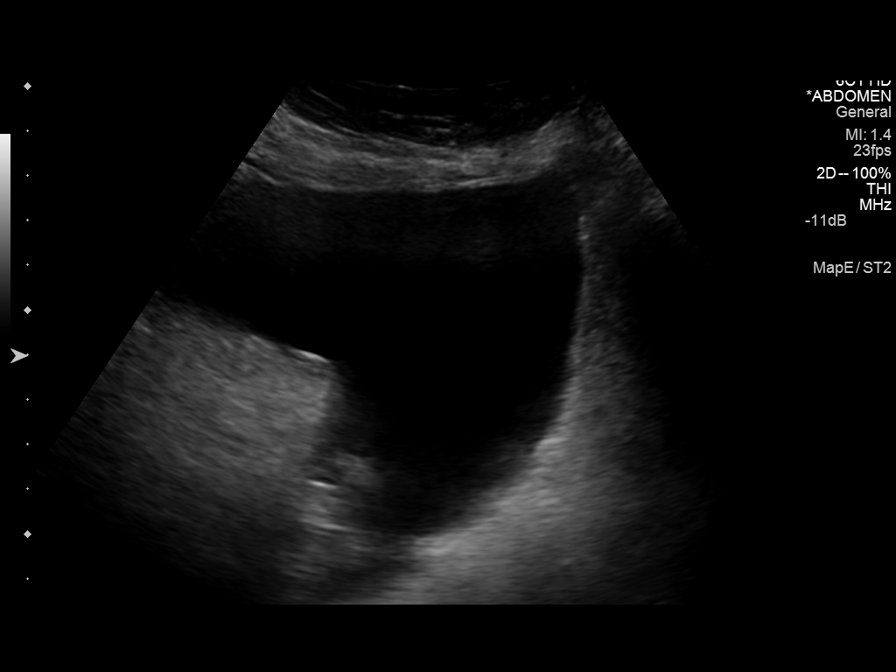
[im 4/8]
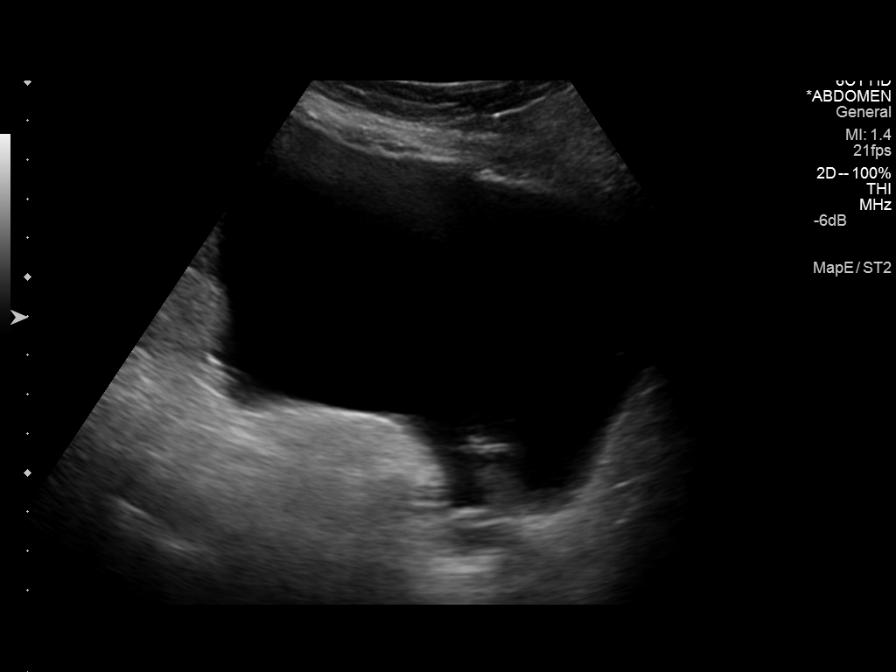
[im 5/8]
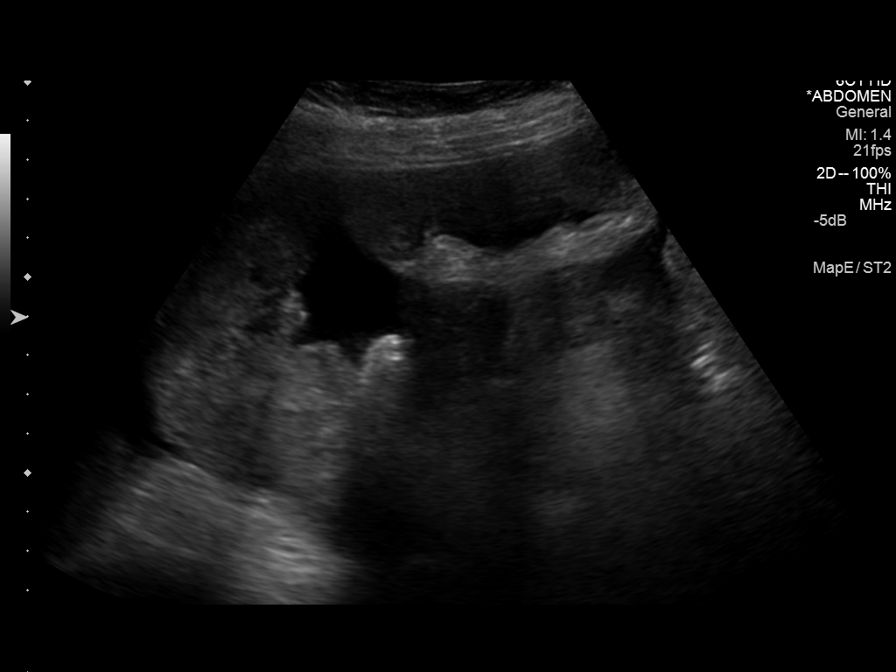
[im 6/8]
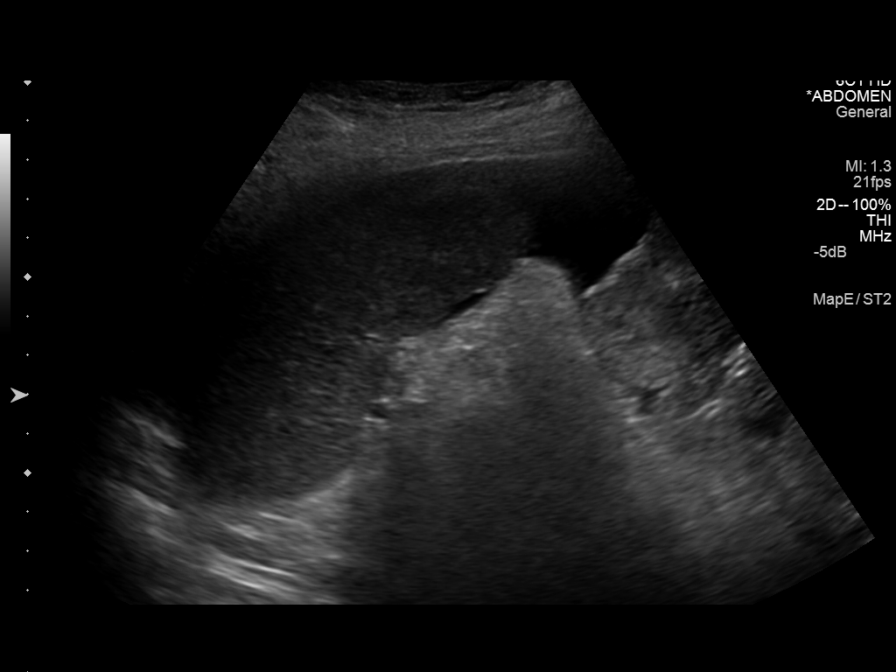
[im 7/8]
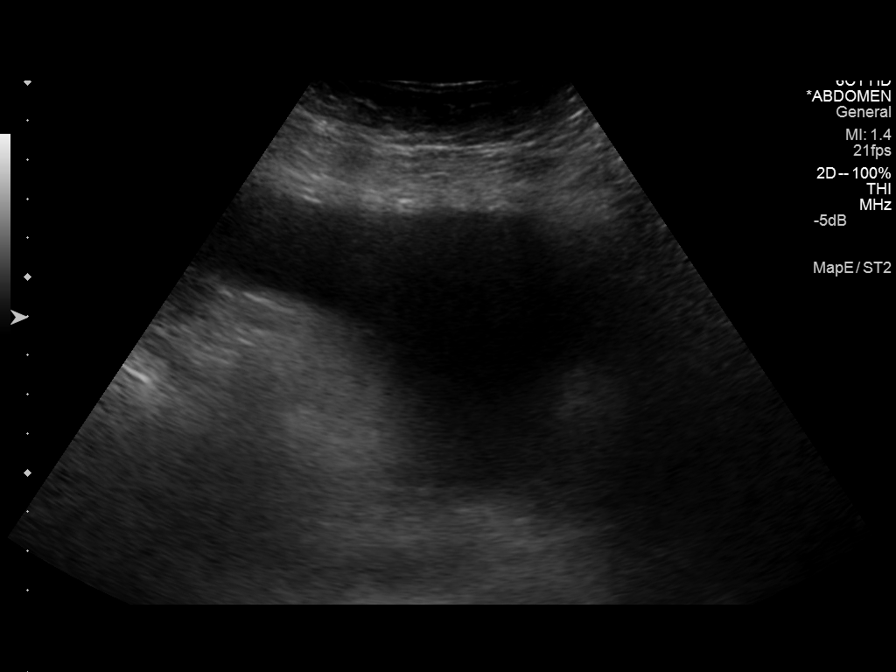
[im 8/8]
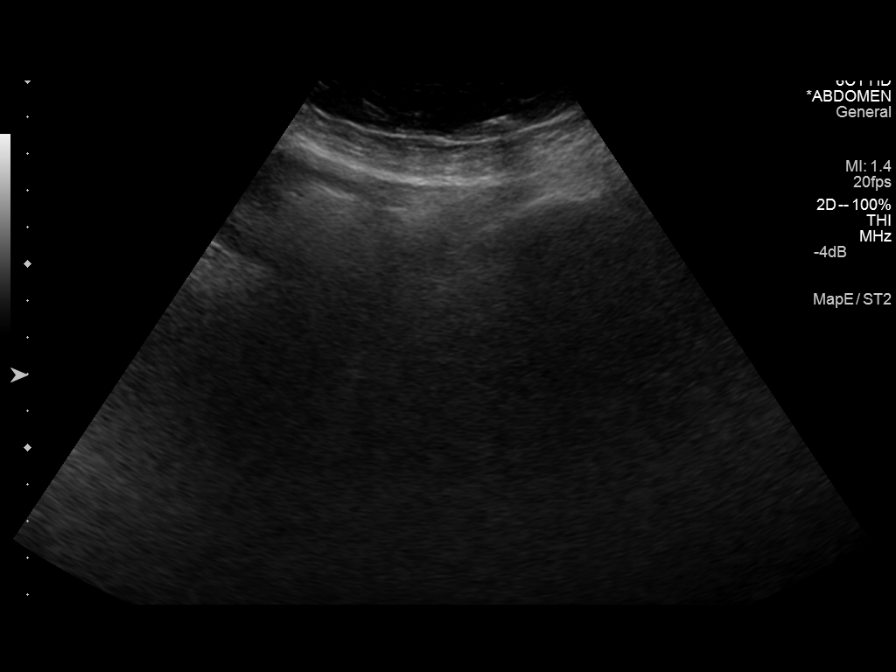

[8 of 8 positions shown; findings below may reference images not displayed]

Initial ultrasound scanning demonstrates a moderate to large amount
of ascites within the right lower abdominal quadrant. The right
lower abdomen was prepped and draped in the usual sterile fashion.
1% lidocaine was used for local anesthesia. Under direct ultrasound
guidance, a 19 gauge, 10-cm, Yueh catheter was introduced. An
ultrasound image was saved for documentation purposed. The
paracentesis was performed. The catheter was removed and a dressing
was applied. The patient tolerated the procedure well without
immediate post procedural complication.
FINDINGS: A total of approximately 3.6 liters of yellow fluid was removed.
Samples were sent to the laboratory as requested by the clinical
team.
IMPRESSION: Successful ultrasound-guided diagnostic and therapeutic paracentesis
yielding 3.6 liters of peritoneal fluid.

## 2016-11-24 ENCOUNTER — Other Ambulatory Visit: Payer: Self-pay | Admitting: Nurse Practitioner
